# Patient Record
Sex: Female | Born: 1935 | Race: Black or African American | Hispanic: No
Health system: Midwestern US, Community
[De-identification: ages and names within clinical notes are randomized; demographics above are authoritative.]

## PROBLEM LIST (undated history)

## (undated) DIAGNOSIS — I4891 Unspecified atrial fibrillation: Principal | ICD-10-CM

## (undated) DIAGNOSIS — F03A Unspecified dementia, mild, without behavioral disturbance, psychotic disturbance, mood disturbance, and anxiety (HCC): Secondary | ICD-10-CM

## (undated) DIAGNOSIS — Z0000 Encounter for general adult medical examination without abnormal findings: Secondary | ICD-10-CM

## (undated) DIAGNOSIS — I10 Essential (primary) hypertension: Secondary | ICD-10-CM

## (undated) DIAGNOSIS — R0981 Nasal congestion: Secondary | ICD-10-CM

## (undated) DIAGNOSIS — R051 Acute cough: Secondary | ICD-10-CM

## (undated) DIAGNOSIS — E781 Pure hyperglyceridemia: Secondary | ICD-10-CM

## (undated) DIAGNOSIS — G309 Alzheimer's disease, unspecified: Secondary | ICD-10-CM

## (undated) DIAGNOSIS — F02B Dementia in other diseases classified elsewhere, moderate, without behavioral disturbance, psychotic disturbance, mood disturbance, and anxiety (HCC): Secondary | ICD-10-CM

## (undated) DIAGNOSIS — K219 Gastro-esophageal reflux disease without esophagitis: Secondary | ICD-10-CM

## (undated) DIAGNOSIS — I4821 Permanent atrial fibrillation: Secondary | ICD-10-CM

## (undated) DIAGNOSIS — E785 Hyperlipidemia, unspecified: Secondary | ICD-10-CM

## (undated) DIAGNOSIS — G629 Polyneuropathy, unspecified: Secondary | ICD-10-CM

## (undated) DIAGNOSIS — K5790 Diverticulosis of intestine, part unspecified, without perforation or abscess without bleeding: Secondary | ICD-10-CM

## (undated) DIAGNOSIS — E559 Vitamin D deficiency, unspecified: Secondary | ICD-10-CM

## (undated) DIAGNOSIS — I495 Sick sinus syndrome: Secondary | ICD-10-CM

## (undated) DIAGNOSIS — H547 Unspecified visual loss: Secondary | ICD-10-CM

## (undated) HISTORY — DX: Diverticulosis of intestine, part unspecified, without perforation or abscess without bleeding: K57.90

## (undated) HISTORY — DX: Polyneuropathy, unspecified: G62.9

## (undated) HISTORY — DX: Vitamin D deficiency, unspecified: E55.9

---

## 1976-12-18 HISTORY — PX: ABDOMINAL HYSTERECTOMY: SHX81

## 1988-12-18 HISTORY — PX: BREAST REDUCTION SURGERY: SHX8

## 2002-12-18 HISTORY — PX: CHOLECYSTECTOMY: SHX55

## 2007-07-06 ENCOUNTER — Emergency Department (HOSPITAL_COMMUNITY): Admission: EM | Admit: 2007-07-06 | Discharge: 2007-07-06 | Payer: Self-pay | Admitting: Emergency Medicine

## 2007-12-03 ENCOUNTER — Encounter: Admission: RE | Admit: 2007-12-03 | Discharge: 2007-12-03 | Payer: Self-pay | Admitting: Family Medicine

## 2008-12-04 ENCOUNTER — Encounter: Admission: RE | Admit: 2008-12-04 | Discharge: 2008-12-04 | Payer: Self-pay | Admitting: Family Medicine

## 2009-02-05 ENCOUNTER — Encounter: Admission: RE | Admit: 2009-02-05 | Discharge: 2009-02-05 | Payer: Self-pay | Admitting: Obstetrics and Gynecology

## 2009-03-15 ENCOUNTER — Encounter: Admission: RE | Admit: 2009-03-15 | Discharge: 2009-03-15 | Payer: Self-pay | Admitting: Surgery

## 2009-05-11 ENCOUNTER — Inpatient Hospital Stay (HOSPITAL_COMMUNITY): Admission: RE | Admit: 2009-05-11 | Discharge: 2009-05-17 | Payer: Self-pay | Admitting: Surgery

## 2009-05-11 ENCOUNTER — Encounter (INDEPENDENT_AMBULATORY_CARE_PROVIDER_SITE_OTHER): Payer: Self-pay | Admitting: Surgery

## 2009-12-06 ENCOUNTER — Encounter: Admission: RE | Admit: 2009-12-06 | Discharge: 2009-12-06 | Payer: Self-pay | Admitting: Family Medicine

## 2009-12-18 HISTORY — PX: PARTIAL COLECTOMY: SHX5273

## 2010-12-07 ENCOUNTER — Encounter
Admission: RE | Admit: 2010-12-07 | Discharge: 2010-12-07 | Payer: Self-pay | Source: Home / Self Care | Attending: Family Medicine | Admitting: Family Medicine

## 2011-03-28 LAB — DIFFERENTIAL
Basophils Absolute: 0 10*3/uL (ref 0.0–0.1)
Basophils Absolute: 0 10*3/uL (ref 0.0–0.1)
Eosinophils Absolute: 0 10*3/uL (ref 0.0–0.7)
Lymphocytes Relative: 18 % (ref 12–46)
Lymphs Abs: 2.3 10*3/uL (ref 0.7–4.0)
Monocytes Relative: 7 % (ref 3–12)
Monocytes Relative: 7 % (ref 3–12)
Neutro Abs: 12 10*3/uL — ABNORMAL HIGH (ref 1.7–7.7)
Neutro Abs: 7.9 10*3/uL — ABNORMAL HIGH (ref 1.7–7.7)

## 2011-03-28 LAB — CBC
HCT: 43.1 % (ref 36.0–46.0)
Hemoglobin: 12.8 g/dL (ref 12.0–15.0)
Hemoglobin: 14.4 g/dL (ref 12.0–15.0)
MCHC: 33.3 g/dL (ref 30.0–36.0)
MCHC: 34.6 g/dL (ref 30.0–36.0)
MCV: 96.3 fL (ref 78.0–100.0)
MCV: 96.6 fL (ref 78.0–100.0)
Platelets: 263 10*3/uL (ref 150–400)
Platelets: 271 10*3/uL (ref 150–400)
RBC: 3.9 MIL/uL (ref 3.87–5.11)
RBC: 4.46 MIL/uL (ref 3.87–5.11)
RDW: 14 % (ref 11.5–15.5)
RDW: 14.2 % (ref 11.5–15.5)
WBC: 11.3 10*3/uL — ABNORMAL HIGH (ref 4.0–10.5)

## 2011-03-28 LAB — BASIC METABOLIC PANEL
BUN: 8 mg/dL (ref 6–23)
BUN: 9 mg/dL (ref 6–23)
Chloride: 100 mEq/L (ref 96–112)
Chloride: 100 mEq/L (ref 96–112)
Chloride: 106 mEq/L (ref 96–112)
Creatinine, Ser: 0.76 mg/dL (ref 0.4–1.2)
GFR calc Af Amer: >60 mL/min (ref >60)
Sodium: 135 mEq/L (ref 135–145)
Sodium: 137 mEq/L (ref 135–145)
Sodium: 145 mEq/L (ref 135–145)

## 2011-03-28 LAB — HEMOGLOBIN AND HEMATOCRIT, BLOOD: HCT: 43 % (ref 36.0–46.0)

## 2011-05-02 NOTE — Op Note (Signed)
NAMEPAYTYN, MESTA              ACCOUNT NO.:  [id number]   MEDICAL RECORD NO.:  [id number]          PATIENT TYPE:  INP   LOCATION:  0001                         FACILITY:  [facility]   PHYSICIAN:  Thornton Park. 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   PROCEDURE:  Laparotomy, takedown of colovaginal fistula, sigmoid  colectomy, closure of vaginal hole (Dr. Richardson Dopp), primary sigmoid colon  anastomosis, rigid sigmoidoscopy.   SURGEON:  Luretha Murphy, M.D.   ASSISTANT:  Consuello Bossier, M.D.   ANESTHESIA:  General endotracheal.   DRAINS:  One 30 Blake in the pelvis.   DESCRIPTION OF PROCEDURE:  Ms. Marybel Alcott is a 77 year old African  American lady with the above-mentioned problem.  She was taken to Room  11 on Tuesday, May 11, 2009, and given general anesthesia.  The abdomen  was prepped with Betadine including a Betadine vaginal prep.  Dr.  Vonita Moss first put in bilateral ureteral stents, and then with her  prepped and draped, we made a lower midline incision and entered the  abdomen without difficulty.  Exploration revealed a colovaginal fistula,  and once I had taken this down, this hole in the colon was actually in  the mid sigmoid.  I was then able to resect the inflamed portion.  I  looked at her studies, and she has tics scattered throughout the left  colon and in the sigmoid, and I resected an area worse disease.  This  was done by stapling with a contour on either end and resecting the  mesentery using Kelly clamps and 2-0 suture ligatures.   Dr. Richardson Dopp came in and then [facility] the hole in her vagina and dictated  under a separate note.   Next, I went ahead and cleaned the ends of the bowel with  straight  clamps and amputating the staple lines with the proximal bowel clamp.  A  two-layer anastomosis was then constructed using 3-0 silks on the back  wall and an inner layer of running 4-0 PDS on the inner layer and  carried anteriorly in a Connell fashion.  An anterior layer of 3-0 silks  were used to complete the anastomosis.  Rigid sigmoidoscope was then  inserted, and the colon was inflated and submerged, and no bubbles were  seen.   A 19 Blake drain was then placed in the pelvis and brought through a  separate incision.  The wound was then closed with 0 PDS in the  peritoneum layer posteriorly, inferiorly and then some fascia above the  linea semilunaris.  The  anterior fascia was closed with interrupted #1 Novofil, taken care to  avoid the obliquely placed drain in the right lower quadrant.  I felt  like we missed that.  Wound was irrigated and closed with staples.  The  patient was taken to the recovery room in satisfactory condition.  Thornton Park Daphine Deutscher, MD  Electronically Signed     MBM/MEDQ  D:  05/11/2009  T:  05/11/2009  Job:  [id number]   cc:   Dario Guardian, M.D.  Fax: [phone]   Gerald Leitz, MD  Fax: [phone]   Maretta Bees. Vonita Moss, M.D.  Fax: [phone]

## 2011-05-02 NOTE — Op Note (Signed)
Megan [name], WAHLER NO.:  [id number]   MEDICAL RECORD NO.:  [id number]          PATIENT TYPE:  INP   LOCATION:  0001                         FACILITY:  [facility]   PHYSICIAN:  Maretta Bees. Vonita Moss, M.D.DATE OF BIRTH:  02-09-1933   DATE OF PROCEDURE:  05/11/2009  DATE OF DISCHARGE:                               OPERATIVE REPORT   PREOPERATIVE DIAGNOSIS:  Colovaginal fistula.   POSTOPERATIVE DIAGNOSIS:  Colovaginal fistula.   PROCEDURE:  1. Cystoscopy.  2. Bilateral retrograde pyelograms with interpretation.  3. Bilateral ureteral catheter placement.   SURGEON:  Maretta Bees. Vonita Moss, M.D.   ANESTHESIA:  General.   INDICATIONS:  This lady has a colovaginal fistula and is going to have  that repaired today.  Dr. Daphine Deutscher asked me to place ureteral catheters  bilaterally to help him better identify the ureters during his open  surgical exploration.   The patient and her husband had previously been counseled about this  procedure.   PROCEDURE:  The patient was brought to the operating room and placed in  lithotomy position.  External genitalia and abdomen were prepped and  draped in usual fashion.  She was cystoscoped and the bladder was  unremarkable with no stones, tumors or inflammatory lesions.  I then  used a 5-French whistle-tip catheter with a blue marker and this placed  it up the left ureter at a distance of 25-26 cm.  I injected contrast  and she had a nonobstructed pyelocaliceal system with no filling  defects.  I then inserted a similar 5-French whistle-tip catheter with a  red marker up the right ureter.  I injected contrast and there was a  delicate nonobstructed collecting system, but there was a nonobstructing  kink that did not allow easy advancement of the whistle-tip catheter.  I  felt it perfectly adequate to leave it below this area and not risk any  injury to the ureter.  The right ureteral catheter was left about 20 cm  up the  right ureter.   At this point the ureteral catheters were left in place  while I removed the cystoscope.  The ureteral catheters were connected  to a Y tubing connection system along with the Foley catheter that I  placed without difficulty.  Catheters connected to closed drainage and  kept sterile.  She tolerated the procedure well.      Maretta Bees. Vonita Moss, M.D.  Electronically Signed     LJP/MEDQ  D:  05/11/2009  T:  05/11/2009  Job:  [id number]   cc:   Thornton Park Daphine Deutscher, MD  [address], [address]  [place] [address]   Gerald Leitz, MD  Fax: [phone]

## 2011-05-02 NOTE — Op Note (Signed)
NAMEKYMARI, LOLLIS NO.:  [id number]   MEDICAL RECORD NO.:  [id number]          PATIENT TYPE:  INP   LOCATION:  0001                         [facility]ACILITY:  [facility]   PHYSICIAN:  Gerald Leitz, MD          DATE OF BIRTH:  1933-06-10   DATE OF PROCEDURE:  DATE OF DISCHARGE:                               OPERATIVE REPORT   PREOPERATIVE DIAGNOSIS:  Colovaginal fistula.   POSTOPERATIVE DIAGNOSIS:  Colovaginal fistula.   PROCEDURE:  The sigmoid colectomy performed by Dr. Daphine Deutscher.  Repair of  vaginal cuff performed by Gerald Leitz, MD.  This dictation is for the  repair of the vaginal cuff.   INDICATIONS:  This is a 77 year old with a colovaginal fistula.  I was  asked by Dr. Daphine Deutscher to repair the vaginal defect.   PROCEDURE:  Upon entering the operating room, Dr. Daphine Deutscher assisted by  Anselm Pancoast. Weatherly, M.D. had opened the abdomen via a vertical  incision and resected the portion of the sigmoid colon from the vaginal  cuff.  I was asked to repair this defect.  The vaginal cuff defect was  located on the left side of the vaginal cuff and was very close to the  bladder.  The vaginal cuff mucosa was reapproximated with figure-of-  eight stitches of zero Vicryl.  Excellent hemostasis was noted.  At this  point, I left the operating room to allow Dr. Daphine Deutscher and Anselm Pancoast.  Weatherly, M.D. to continue their portion of the surgery.  Please see  their dictation.      Gerald Leitz, MD  Electronically Signed     TC/MEDQ  D:  05/11/2009  T:  05/11/2009  Job:  [id number]

## 2011-05-05 NOTE — H&P (Signed)
NAMEBREYONNA, NAULT NO.:  [id number]   MEDICAL RECORD NO.:  [id number]          PATIENT TYPE:  INP   LOCATION:  1534                         FACILITY:  [facility]   PHYSICIAN:  Thornton Park. Daphine Deutscher, MD  DATE OF BIRTH:  08-27-1933   DATE OF ADMISSION:  05/11/2009  DATE OF DISCHARGE:  05/17/2009                              HISTORY & PHYSICAL   ADMITTING DIAGNOSIS:  Colovaginal fistula.   HISTORY:  Eulanda Dorion is a 77 year old African American lady who is  followed by Dr. Gerald Leitz who has clinically and radiographically a  colovaginal fistula.  She had a previous hysterectomy and has had  diverticulitis and then a diverticular abscess which one night  spontaneously draining in her vagina relieving her pain but producing a  large, foul-smelling vaginal discharge.  Since that time, she has  noticed fecal material passing in her vagina.  She was seen in the  office and we proceeded to schedule her for surgery.  Informed consent  was obtained.   PAST MEDICAL HISTORY:  1. She has atrial fibrillation, is on Coumadin.  2. She has high blood pressure.   PATIENT HAS NO KNOWN ALLERGIES.   MEDICATIONS:  1. Lopid 600 mg b.i.d.  2. Verapamil 240 mg 1 tablet a day.  3. Metoprolol 50 mg one twice a day.  4. Lanoxin 0.25 mg one a day.  5. Hydrochlorothiazide 12.5 mg one day.  6. Coumadin 2.5 mg 1 tablet a day.   FAMILY HISTORY:  Both parents are deceased.   REVIEW OF SYSTEMS:  Unremarkable.   VITAL SIGNS:  Pulse 80.  Afebrile.  Blood pressure 112/75.  HEENT:  Unremarkable.  NECK:  Supple.  CHEST:  Clear to auscultation.  HEART:  Sinus rhythm without murmurs.  ABDOMEN:  Nontender.  No tenderness in the lower abdomen.  SKIN:  Turgor normal.  NEUROPSYCH:  Mood and affect were appropriate.   IMPRESSION:  Colovaginal fistula.   PLAN:  Preoperative stent placement followed by sigmoid colectomy and  closure of the vaginal hole.      Thornton Park Daphine Deutscher, MD  Electronically Signed     MBM/MEDQ  D:  06/06/2009  T:  06/07/2009  Job:  [id number]

## 2011-05-05 NOTE — Discharge Summary (Signed)
Megan [name], UYENO NO.:  [id number]   MEDICAL RECORD NO.:  [id number]          PATIENT TYPE:  INP   LOCATION:  1534                         FACILITY:  [facility]   PHYSICIAN:  Thornton Park. Daphine Deutscher, MD  DATE OF BIRTH:  December 03, 1933   DATE OF ADMISSION:  05/11/2009  DATE OF DISCHARGE:  05/17/2009                               DISCHARGE SUMMARY   ADMISSION DIAGNOSIS:  Colovaginal fistula.   PROCEDURE:  1. Takedown of colovaginal fistula.  2. Sigmoid colectomy with primary anastomosis.  3. Closure of vagina by Dr. Gerald Leitz with rigid sigmoidoscopy.   HOSPITAL COURSE:  This 77 year old lady underwent cystoscopy and  bilateral ureteral stent placement by Dr. Vonita Moss.  She was in the  operating room and, therefore, underwent the above-mentioned takedown of  this fistula and closure of the hole in the vagina by Dr. Richardson Dopp.  She had  an end-to-end anastomosis and this was tested with sigmoidoscopy  intraoperatively.  She was admitted and observed postoperatively and was  passing flatus by postop day #3 and JP was serosanguineous.  She  continued to improve.  Her diet was advanced.  She was ready for  discharge on May 31 which would be postop day #6.  Condition improved.   FINAL DIAGNOSIS:  Status post takedown of colovaginal fistula.      Thornton Park Daphine Deutscher, MD  Electronically Signed     MBM/MEDQ  D:  06/06/2009  T:  06/07/2009  Job:  [id number]

## 2011-11-14 ENCOUNTER — Other Ambulatory Visit: Payer: Self-pay | Admitting: Family Medicine

## 2011-11-14 DIAGNOSIS — Z1231 Encounter for screening mammogram for malignant neoplasm of breast: Secondary | ICD-10-CM

## 2011-12-14 ENCOUNTER — Ambulatory Visit
Admission: RE | Admit: 2011-12-14 | Discharge: 2011-12-14 | Disposition: A | Discharge status: Home / Self Care | Payer: Medicare Other | Source: Ambulatory Visit | Attending: Family Medicine | Admitting: Family Medicine

## 2011-12-14 DIAGNOSIS — Z1231 Encounter for screening mammogram for malignant neoplasm of breast: Secondary | ICD-10-CM

## 2011-12-27 DIAGNOSIS — I4891 Unspecified atrial fibrillation: Secondary | ICD-10-CM | POA: Diagnosis not present

## 2012-01-24 DIAGNOSIS — Z79899 Other long term (current) drug therapy: Secondary | ICD-10-CM | POA: Diagnosis not present

## 2012-01-24 DIAGNOSIS — N949 Unspecified condition associated with female genital organs and menstrual cycle: Secondary | ICD-10-CM | POA: Diagnosis not present

## 2012-01-24 DIAGNOSIS — E785 Hyperlipidemia, unspecified: Secondary | ICD-10-CM | POA: Diagnosis not present

## 2012-01-24 DIAGNOSIS — I4891 Unspecified atrial fibrillation: Secondary | ICD-10-CM | POA: Diagnosis not present

## 2012-01-24 DIAGNOSIS — Z01419 Encounter for gynecological examination (general) (routine) without abnormal findings: Secondary | ICD-10-CM | POA: Diagnosis not present

## 2012-02-01 DIAGNOSIS — N949 Unspecified condition associated with female genital organs and menstrual cycle: Secondary | ICD-10-CM | POA: Diagnosis not present

## 2012-02-28 DIAGNOSIS — I4891 Unspecified atrial fibrillation: Secondary | ICD-10-CM | POA: Diagnosis not present

## 2012-02-28 DIAGNOSIS — E785 Hyperlipidemia, unspecified: Secondary | ICD-10-CM | POA: Diagnosis not present

## 2012-03-26 DIAGNOSIS — E538 Deficiency of other specified B group vitamins: Secondary | ICD-10-CM | POA: Diagnosis not present

## 2012-03-26 DIAGNOSIS — I4891 Unspecified atrial fibrillation: Secondary | ICD-10-CM | POA: Diagnosis not present

## 2012-03-26 DIAGNOSIS — M6281 Muscle weakness (generalized): Secondary | ICD-10-CM | POA: Diagnosis not present

## 2012-03-26 DIAGNOSIS — I10 Essential (primary) hypertension: Secondary | ICD-10-CM | POA: Diagnosis not present

## 2012-03-26 DIAGNOSIS — E785 Hyperlipidemia, unspecified: Secondary | ICD-10-CM | POA: Diagnosis not present

## 2012-03-26 DIAGNOSIS — G609 Hereditary and idiopathic neuropathy, unspecified: Secondary | ICD-10-CM | POA: Diagnosis not present

## 2012-03-26 DIAGNOSIS — Z1331 Encounter for screening for depression: Secondary | ICD-10-CM | POA: Diagnosis not present

## 2012-03-26 DIAGNOSIS — E559 Vitamin D deficiency, unspecified: Secondary | ICD-10-CM | POA: Diagnosis not present

## 2012-04-01 DIAGNOSIS — Z7901 Long term (current) use of anticoagulants: Secondary | ICD-10-CM | POA: Diagnosis not present

## 2012-04-01 DIAGNOSIS — I10 Essential (primary) hypertension: Secondary | ICD-10-CM | POA: Diagnosis not present

## 2012-04-01 DIAGNOSIS — M545 Low back pain: Secondary | ICD-10-CM | POA: Diagnosis not present

## 2012-04-01 DIAGNOSIS — M6281 Muscle weakness (generalized): Secondary | ICD-10-CM | POA: Diagnosis not present

## 2012-04-01 DIAGNOSIS — H548 Legal blindness, as defined in USA: Secondary | ICD-10-CM | POA: Diagnosis not present

## 2012-04-01 DIAGNOSIS — E559 Vitamin D deficiency, unspecified: Secondary | ICD-10-CM | POA: Diagnosis not present

## 2012-04-03 DIAGNOSIS — Z79899 Other long term (current) drug therapy: Secondary | ICD-10-CM | POA: Diagnosis not present

## 2012-04-03 DIAGNOSIS — I119 Hypertensive heart disease without heart failure: Secondary | ICD-10-CM | POA: Diagnosis not present

## 2012-04-03 DIAGNOSIS — I4891 Unspecified atrial fibrillation: Secondary | ICD-10-CM | POA: Diagnosis not present

## 2012-04-09 DIAGNOSIS — M6281 Muscle weakness (generalized): Secondary | ICD-10-CM | POA: Diagnosis not present

## 2012-04-09 DIAGNOSIS — Z79899 Other long term (current) drug therapy: Secondary | ICD-10-CM | POA: Diagnosis not present

## 2012-04-09 DIAGNOSIS — E559 Vitamin D deficiency, unspecified: Secondary | ICD-10-CM | POA: Diagnosis not present

## 2012-04-09 DIAGNOSIS — E785 Hyperlipidemia, unspecified: Secondary | ICD-10-CM | POA: Diagnosis not present

## 2012-04-09 DIAGNOSIS — M545 Low back pain: Secondary | ICD-10-CM | POA: Diagnosis not present

## 2012-04-09 DIAGNOSIS — G609 Hereditary and idiopathic neuropathy, unspecified: Secondary | ICD-10-CM | POA: Diagnosis not present

## 2012-04-09 DIAGNOSIS — I10 Essential (primary) hypertension: Secondary | ICD-10-CM | POA: Diagnosis not present

## 2012-04-09 DIAGNOSIS — E538 Deficiency of other specified B group vitamins: Secondary | ICD-10-CM | POA: Diagnosis not present

## 2012-04-09 DIAGNOSIS — H548 Legal blindness, as defined in USA: Secondary | ICD-10-CM | POA: Diagnosis not present

## 2012-04-09 DIAGNOSIS — I4891 Unspecified atrial fibrillation: Secondary | ICD-10-CM | POA: Diagnosis not present

## 2012-04-09 DIAGNOSIS — Z7901 Long term (current) use of anticoagulants: Secondary | ICD-10-CM | POA: Diagnosis not present

## 2012-04-10 DIAGNOSIS — H548 Legal blindness, as defined in USA: Secondary | ICD-10-CM | POA: Diagnosis not present

## 2012-04-10 DIAGNOSIS — E559 Vitamin D deficiency, unspecified: Secondary | ICD-10-CM | POA: Diagnosis not present

## 2012-04-10 DIAGNOSIS — M6281 Muscle weakness (generalized): Secondary | ICD-10-CM | POA: Diagnosis not present

## 2012-04-10 DIAGNOSIS — M545 Low back pain: Secondary | ICD-10-CM | POA: Diagnosis not present

## 2012-04-10 DIAGNOSIS — Z7901 Long term (current) use of anticoagulants: Secondary | ICD-10-CM | POA: Diagnosis not present

## 2012-04-10 DIAGNOSIS — I10 Essential (primary) hypertension: Secondary | ICD-10-CM | POA: Diagnosis not present

## 2012-04-17 DIAGNOSIS — E559 Vitamin D deficiency, unspecified: Secondary | ICD-10-CM | POA: Diagnosis not present

## 2012-04-17 DIAGNOSIS — I10 Essential (primary) hypertension: Secondary | ICD-10-CM | POA: Diagnosis not present

## 2012-04-17 DIAGNOSIS — H548 Legal blindness, as defined in USA: Secondary | ICD-10-CM | POA: Diagnosis not present

## 2012-04-17 DIAGNOSIS — M545 Low back pain: Secondary | ICD-10-CM | POA: Diagnosis not present

## 2012-04-17 DIAGNOSIS — Z7901 Long term (current) use of anticoagulants: Secondary | ICD-10-CM | POA: Diagnosis not present

## 2012-04-17 DIAGNOSIS — M6281 Muscle weakness (generalized): Secondary | ICD-10-CM | POA: Diagnosis not present

## 2012-05-23 DIAGNOSIS — E559 Vitamin D deficiency, unspecified: Secondary | ICD-10-CM | POA: Diagnosis not present

## 2012-05-23 DIAGNOSIS — I4891 Unspecified atrial fibrillation: Secondary | ICD-10-CM | POA: Diagnosis not present

## 2012-06-27 DIAGNOSIS — E7800 Pure hypercholesterolemia, unspecified: Secondary | ICD-10-CM | POA: Diagnosis not present

## 2012-06-27 DIAGNOSIS — M6281 Muscle weakness (generalized): Secondary | ICD-10-CM | POA: Diagnosis not present

## 2012-06-27 DIAGNOSIS — E785 Hyperlipidemia, unspecified: Secondary | ICD-10-CM | POA: Diagnosis not present

## 2012-06-27 DIAGNOSIS — I4891 Unspecified atrial fibrillation: Secondary | ICD-10-CM | POA: Diagnosis not present

## 2012-06-27 DIAGNOSIS — E559 Vitamin D deficiency, unspecified: Secondary | ICD-10-CM | POA: Diagnosis not present

## 2012-06-27 DIAGNOSIS — I10 Essential (primary) hypertension: Secondary | ICD-10-CM | POA: Diagnosis not present

## 2012-06-27 DIAGNOSIS — G609 Hereditary and idiopathic neuropathy, unspecified: Secondary | ICD-10-CM | POA: Diagnosis not present

## 2012-06-27 DIAGNOSIS — E538 Deficiency of other specified B group vitamins: Secondary | ICD-10-CM | POA: Diagnosis not present

## 2012-08-01 DIAGNOSIS — I4891 Unspecified atrial fibrillation: Secondary | ICD-10-CM | POA: Diagnosis not present

## 2012-09-05 DIAGNOSIS — I4891 Unspecified atrial fibrillation: Secondary | ICD-10-CM | POA: Diagnosis not present

## 2012-09-22 ENCOUNTER — Emergency Department (HOSPITAL_COMMUNITY): Payer: Medicare Other

## 2012-09-22 ENCOUNTER — Inpatient Hospital Stay (HOSPITAL_COMMUNITY)
Admission: EM | Admit: 2012-09-22 | Discharge: 2012-09-26 | DRG: 244 | Disposition: A | Discharge status: Home / Self Care | Payer: Medicare Other | Attending: Cardiology | Admitting: Cardiology

## 2012-09-22 ENCOUNTER — Encounter (HOSPITAL_COMMUNITY): Payer: Self-pay | Admitting: *Deleted

## 2012-09-22 ENCOUNTER — Encounter (HOSPITAL_COMMUNITY): Payer: Self-pay

## 2012-09-22 ENCOUNTER — Emergency Department (INDEPENDENT_AMBULATORY_CARE_PROVIDER_SITE_OTHER)
Admission: EM | Admit: 2012-09-22 | Discharge: 2012-09-22 | Disposition: A | Discharge status: Nursing Home (Includes ICF) | Payer: Medicare Other | Source: Home / Self Care

## 2012-09-22 DIAGNOSIS — I498 Other specified cardiac arrhythmias: Secondary | ICD-10-CM

## 2012-09-22 DIAGNOSIS — I495 Sick sinus syndrome: Secondary | ICD-10-CM | POA: Diagnosis not present

## 2012-09-22 DIAGNOSIS — R55 Syncope and collapse: Secondary | ICD-10-CM | POA: Diagnosis not present

## 2012-09-22 DIAGNOSIS — K219 Gastro-esophageal reflux disease without esophagitis: Secondary | ICD-10-CM | POA: Diagnosis present

## 2012-09-22 DIAGNOSIS — Z7901 Long term (current) use of anticoagulants: Secondary | ICD-10-CM | POA: Diagnosis not present

## 2012-09-22 DIAGNOSIS — R001 Bradycardia, unspecified: Secondary | ICD-10-CM

## 2012-09-22 DIAGNOSIS — E785 Hyperlipidemia, unspecified: Secondary | ICD-10-CM | POA: Diagnosis present

## 2012-09-22 DIAGNOSIS — I422 Other hypertrophic cardiomyopathy: Secondary | ICD-10-CM | POA: Diagnosis present

## 2012-09-22 DIAGNOSIS — I119 Hypertensive heart disease without heart failure: Secondary | ICD-10-CM | POA: Diagnosis present

## 2012-09-22 DIAGNOSIS — Z950 Presence of cardiac pacemaker: Secondary | ICD-10-CM

## 2012-09-22 DIAGNOSIS — I4891 Unspecified atrial fibrillation: Secondary | ICD-10-CM | POA: Diagnosis not present

## 2012-09-22 DIAGNOSIS — I4821 Permanent atrial fibrillation: Secondary | ICD-10-CM | POA: Diagnosis present

## 2012-09-22 DIAGNOSIS — T45515A Adverse effect of anticoagulants, initial encounter: Secondary | ICD-10-CM | POA: Diagnosis present

## 2012-09-22 HISTORY — DX: Gastro-esophageal reflux disease without esophagitis: K21.9

## 2012-09-22 HISTORY — DX: Unspecified visual loss: H54.7

## 2012-09-22 HISTORY — DX: Essential (primary) hypertension: I10

## 2012-09-22 HISTORY — DX: Sick sinus syndrome: I49.5

## 2012-09-22 HISTORY — DX: Permanent atrial fibrillation: I48.21

## 2012-09-22 HISTORY — DX: Hyperlipidemia, unspecified: E78.5

## 2012-09-22 LAB — CBC
HCT: 41.9 % (ref 36.0–46.0)
Hemoglobin: 14.3 g/dL (ref 12.0–15.0)
RBC: 4.52 MIL/uL (ref 3.87–5.11)
RDW: 13.2 % (ref 11.5–15.5)
WBC: 10.1 10*3/uL (ref 4.0–10.5)

## 2012-09-22 LAB — POCT I-STAT TROPONIN I: Troponin i, poc: 0.01 ng/mL (ref 0.00–0.08)

## 2012-09-22 LAB — POCT I-STAT, CHEM 8
BUN: 17 mg/dL (ref 6–23)
Hemoglobin: 14.3 g/dL (ref 12.0–15.0)
Sodium: 141 mEq/L (ref 135–145)
TCO2: 26 mmol/L (ref 0–100)

## 2012-09-22 LAB — DIGOXIN LEVEL: Digoxin Level: 1.5 ng/mL (ref 0.8–2.0)

## 2012-09-22 LAB — PROTIME-INR: INR: 3.23 — ABNORMAL HIGH (ref 0.00–1.49)

## 2012-09-22 MED ORDER — ACETAMINOPHEN 325 MG PO TABS: 650.0000 mg | ORAL_TABLET | Freq: Four times a day (QID) | ORAL | Status: DC | PRN

## 2012-09-22 MED ORDER — ATROPINE SULFATE 1 MG/ML IJ SOLN
INTRAMUSCULAR | Status: AC
  Filled 2012-09-22: qty 1

## 2012-09-22 MED ORDER — HYDROCHLOROTHIAZIDE 12.5 MG PO CAPS
12.5000 mg | ORAL_CAPSULE | Freq: Every day | ORAL | Status: DC
  Administered 2012-09-23 – 2012-09-26 (×3): 12.5 mg via ORAL
  Filled 2012-09-22 (×4): qty 1

## 2012-09-22 MED ORDER — SODIUM CHLORIDE 0.9 % IJ SOLN
3.0000 mL | Freq: Two times a day (BID) | INTRAMUSCULAR | Status: DC
  Administered 2012-09-23 – 2012-09-26 (×4): 3 mL via INTRAVENOUS

## 2012-09-22 MED ORDER — FENOFIBRATE 160 MG PO TABS
160.0000 mg | ORAL_TABLET | Freq: Every day | ORAL | Status: DC
  Administered 2012-09-24 – 2012-09-26 (×2): 160 mg via ORAL
  Filled 2012-09-22 (×4): qty 1

## 2012-09-22 MED ORDER — SODIUM CHLORIDE 0.9 % IV SOLN: INTRAVENOUS | Status: DC

## 2012-09-22 MED ORDER — ACETAMINOPHEN 650 MG RE SUPP: 650.0000 mg | Freq: Four times a day (QID) | RECTAL | Status: DC | PRN

## 2012-09-22 NOTE — ED Provider Notes (Signed)
History     CSN: [id number]  Arrival date & time 09/22/12  1605   None     Chief Complaint  Patient presents with  . Irregular Heart Beat    (Consider location/radiation/quality/duration/timing/severity/associated sxs/prior treatment) The history is provided by the patient.  patient reports near syncopal episode today while singing in church.  Sudden onset of dizziness, vitals signs checked by church nurse and found to have a low pulse rate.  Reports she rechecked vitals signs upon return to home and pulse remains low.  States no return of symptoms, known history of atrial fibrillation followed by Dr. Armanda Magic, currently on coumadin and betablocker.  Denies recent illness-one episode of diarrhea last night attributed to ?food intake.  No associated chest pain or shortness of breath.  No history of same, denies known cardiac disease or surgery.  Past Medical History  Diagnosis Date  . Atrial fibrillation   . Benign hypertensive heart disease without heart failure     Past Surgical History  Procedure Date  . Gallbladder surgery     No family history on file.  History  Substance Use Topics  . Smoking status: Never Smoker   . Smokeless tobacco: Not on file  . Alcohol Use: No    OB History    Grav Para Term Preterm Abortions TAB SAB Ect Mult Living                  Review of Systems  Genitourinary: Negative.   All other systems reviewed and are negative.    Allergies  Review of patient's allergies indicates no known allergies.  Home Medications   Current Outpatient Rx  Name Route Sig Dispense Refill  . ACETAMINOPHEN 500 MG PO TABS Oral Take 500 mg by mouth daily.    Marland Kitchen DIGOXIN 0.25 MG PO TABS Oral Take 0.25 mg by mouth daily.    Marland Kitchen FAMOTIDINE 10 MG PO TABS Oral Take 10 mg by mouth daily.    . FENOFIBRATE 160 MG PO TABS Oral Take 160 mg by mouth daily.    Marland Kitchen HYDROCHLOROTHIAZIDE 12.5 MG PO CAPS Oral Take 12.5 mg by mouth daily.    Marland Kitchen METOPROLOL TARTRATE PO Oral  Take 50 mg by mouth 2 (two) times daily.    . ICAPS AREDS FORMULA PO Oral Take by mouth 2 times daily at 12 noon and 4 pm.    . VERAPAMIL HCL ER 240 MG PO TBCR Oral Take 240 mg by mouth at bedtime.    Marland Kitchen VITAMIN D (ERGOCALCIFEROL) 50000 UNITS PO CAPS Oral Take 50,000 Units by mouth once a week.    . WARFARIN SODIUM 2.5 MG PO TABS Oral Take 2.5 mg by mouth. Take 2.5 mg Sun, Tues, Thurs and Sat, take 3.75 MG Monday, Wed and Friday      BP 155/73  Pulse 48  Temp 98.2 F (36.8 C) (Oral)  Resp 16  SpO2 97%  Physical Exam  Nursing note and vitals reviewed. Constitutional: She is oriented to person, place, and time. Vital signs are normal. She appears well-developed and well-nourished. She is active and cooperative.  HENT:  Head: Normocephalic.  Eyes: Conjunctivae normal are normal. Pupils are equal, round, and reactive to light. No scleral icterus.  Neck: Trachea normal. Neck supple.  Cardiovascular: Regular rhythm, normal heart sounds and intact distal pulses.  Bradycardia present.  Exam reveals decreased pulses.        No carotid bruits, Irregular bradycardiac apical pulse, distal pulses diminished  Pulmonary/Chest: Effort  normal and breath sounds normal.  Abdominal: Soft. Normal appearance. There is no tenderness.  Neurological: She is alert and oriented to person, place, and time. She has normal strength and normal reflexes. No cranial nerve deficit or sensory deficit. GCS eye subscore is 4. GCS verbal subscore is 5. GCS motor subscore is 6.  Skin: Skin is warm, dry and intact. She is not diaphoretic.  Psychiatric: She has a normal mood and affect. Her speech is normal and behavior is normal. Judgment and thought content normal. Cognition and memory are normal.    ED Course  Procedures (including critical care time)  Labs Reviewed - No data to display No results found.   1. Bradycardia   2. Near syncope       MDM  EKG- abnormal, Bradycardia rate 48. [facility] transfer to [facility]  for further evaluation and treatment of bradycardia.  Insert peripheral iv, O2 via [place] and cardiac monitoring       Johnsie Kindred, NP 09/22/12 1659

## 2012-09-22 NOTE — ED Notes (Signed)
C/o low pulse rate , started today, patient does have afib

## 2012-09-22 NOTE — H&P (Addendum)
Megan [name] is an 78 y.o. female.   Chief Complaint: slow heart rate HPI: 78 yo woman with PMH of atrial fibrillation, GERD, hypertension and dyslipidemia who is on rate control and anticoagulation with warfarin for her atrial fibrillation who was very lightheaded at church while singing today. She sat down (did not lose consciousness) and was surrounded by 4 nurses from her church. They checked her heart-rate and found it to be 50. They told her if it continued to be low or she had symptoms then she should go to the ER/call her physician. Her husband checked it several times today and and it was as low as 43 so they called urgent care and came in and were directed to the ER. In the ER she was noted to have HR ranging from 38-60s but stable blood pressure. She's had no recent chest pain, no significant change in SOB or DOE. She has stable weight and appetite. No prior syncope or significant orthostatic hypotension.   Past Medical History  Diagnosis Date  . Atrial fibrillation   . Benign hypertensive heart disease without heart failure   . Blind   . Atrial fibrillation   . Symptomatic bradycardia   . GERD (gastroesophageal reflux disease)   . Dyslipidemia   . Current use of long term anticoagulation   . Hypertension     Past Surgical History  Procedure Date  . Gallbladder surgery     History reviewed. No pertinent family history. Social History:  reports that she has never smoked. She does not have any smokeless tobacco history on file. She reports that she does not drink alcohol or use illicit drugs. Family history: no known history of significant diabetes or bradycardia  Allergies: No Known Allergies  Review of Systems  Constitutional: Negative for fever, chills, weight loss and malaise/fatigue.  HENT: Negative for hearing loss, neck pain and tinnitus.   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   Cardiovascular: Negative for chest pain, palpitations and orthopnea.  Gastrointestinal: Positive for heartburn and diarrhea. Negative for nausea, vomiting and abdominal pain.  Genitourinary: Negative for dysuria, urgency and frequency.  Musculoskeletal: Negative for myalgias.  Skin: Negative for itching and rash.  Neurological: Positive for focal weakness. Negative for sensory change, seizures, loss of consciousness and headaches.       + lightheadedness  Endo/Heme/Allergies: Negative for environmental allergies. Does not bruise/bleed easily.  Psychiatric/Behavioral: Negative for depression, suicidal ideas and substance abuse.    Blood pressure 125/87, pulse 38, resp. rate 14, SpO2 100.00%. Physical Exam  Nursing note and vitals reviewed. Constitutional: She is oriented to person, place, and time. She appears well-developed and well-nourished.  HENT:  Nose: Nose normal.  Mouth/Throat: Oropharynx is clear and moist. No oropharyngeal exudate.  Eyes: Conjunctivae normal are normal. Right eye exhibits no discharge. Left eye exhibits no discharge. No scleral icterus.  Neck: Normal range of motion. Neck supple. No JVD present. No tracheal deviation present. No thyromegaly present.  Cardiovascular: Exam reveals no gallop.   No murmur heard.      Irregularly irregular; bradycardic  Respiratory: Effort normal. No respiratory distress. She has no wheezes. She has rales.       Dry scattered crackles at the bases that clear superiorly  GI: Soft. Bowel sounds are normal. She exhibits no distension. There is no tenderness. There  is no rebound.  Musculoskeletal: Normal range of motion. She exhibits no edema and no tenderness.  Neurological: She is alert and oriented to person, place, and time. No cranial nerve deficit. Coordination normal.  Skin: Skin is warm and dry. No rash noted. No erythema.  Psychiatric: She has a normal mood and affect. Her  behavior is normal.    Labs reviewed; K 4.1, Cr. 0.9, bun 17, na 141, inr 3.2, digoxin 1.5, h/h 14.3/41.9, plt 275 EKG reviewed; slow atrial fibrillation HR ~ 50 Chest X-ray: generous heart border/borderline cardiomegaly  Problem List Symptomatic Bradycardia  Slow Atrial fibrillation on digoxin, verapamil and metoprolol Long term anticoagulation with Warfarin, INR 3.2 Hypertension Dyslipidemia on fenofibrate  Assessment/Plan 78 yo woman with PMH of dyslipidemia, atrial fibrillation on digoxin, verapamil and metoprolol with anticoagulation therapeutic on warfarin who had lightheadedness today and found to have slow atrial fibrillation recorded as low as 38 now with symptomatic bradycardia. She has stable renal function and she continues to maintain appropriate mildly hypertensive blood pressure response to bradycardia. She has symptomatic bradycardia with indication for beta blocker or calcium channel blocker to treat her atrial fibrillation so she has a class I indication for a pacemaker. However, she is on digoxin, verapamil and metoprolol so weaning medications may relieve her significant bradycardia. I discussed the challenges with Mr. And Ms. Cradle and they understand we are washing out medications and watching telemetry and symptoms to determine if she'll need a pacemaker placed in the next few days. - holding digoxin, verapamil and metoprolol now - she took them most recently this morning - NPO after midnight for possible permanent pacemaker based on telemetry, drug washout and symptom monitoring - holding warfarin tonight given possible procedure and therapeutic at 3.2, check INR in AM - continue hctz for HTN - check TSH, trend troponins, check hba1c, lipid panel - continue fenofibrate - Stepdown status with continuous telemetry and low threshold for transcutaneous pacing bridge to temporary transvenous pacemaker if necessary - 2D Echo in AM to assess LV function  Latavius Capizzi,  Royann Wildasin 09/22/2012, 9:15 PM

## 2012-09-22 NOTE — ED Notes (Signed)
Per [facility] pt transferred from [facility] for syncopal episode at church and bradycardia in the 40s and 50s. BP 136/77. HR 56. 100% on 2l. Pt was sat down when she had episode and did not injure herself. IV 18g hand

## 2012-09-22 NOTE — ED Notes (Signed)
[facility] called for transport 

## 2012-09-22 NOTE — ED Provider Notes (Signed)
History     CSN: [id number]  Arrival date & time 09/22/12  1715   First MD Initiated Contact with Patient 09/22/12 1756      Chief Complaint  Patient presents with  . Bradycardia  . Loss of Consciousness    (Consider location/radiation/quality/duration/timing/severity/associated sxs/prior treatment) HPI Comments: Patient has history of htn, afib followed by Dr. Carolanne Grumbling with [facility].  She has been feeling weak for the past week.  She went to church this morning and became weak, dizzy, and near-syncopal.  There were several nurses at the church who looked her over and decided her pulse was too low and recommended she go to Urgent Care.  She was then transferred here.    Patient is a 78 y.o. female presenting with syncope. The history is provided by the patient.  Loss of Consciousness This is a new problem. The current episode started 1 to 2 hours ago. Episode frequency: once  The problem has been resolved. Pertinent negatives include no chest pain and no shortness of breath. Associated symptoms comments: weakness. Nothing aggravates the symptoms. Nothing relieves the symptoms. She has tried nothing for the symptoms.    Past Medical History  Diagnosis Date  . Atrial fibrillation   . Benign hypertensive heart disease without heart failure   . Blind     Past Surgical History  Procedure Date  . Gallbladder surgery     History reviewed. No pertinent family history.  History  Substance Use Topics  . Smoking status: Never Smoker   . Smokeless tobacco: Not on file  . Alcohol Use: No    OB History    Grav Para Term Preterm Abortions TAB SAB Ect Mult Living                  Review of Systems  Respiratory: Negative for shortness of breath.   Cardiovascular: Positive for syncope. Negative for chest pain.  All other systems reviewed and are negative.    Allergies  Review of patient's allergies indicates no known allergies.  Home Medications   Current  Outpatient Rx  Name Route Sig Dispense Refill  . DIGOXIN 0.25 MG PO TABS Oral Take 0.25 mg by mouth daily.    Marland Kitchen FAMOTIDINE 10 MG PO TABS Oral Take 10 mg by mouth daily as needed. For acid reflux    . FENOFIBRATE 160 MG PO TABS Oral Take 160 mg by mouth daily.    Marland Kitchen HYDROCHLOROTHIAZIDE 12.5 MG PO CAPS Oral Take 12.5 mg by mouth daily.    Marland Kitchen METOPROLOL TARTRATE 50 MG PO TABS Oral Take 50 mg by mouth 2 (two) times daily.    . ICAPS AREDS FORMULA PO Oral Take 2 capsules by mouth daily.     Marland Kitchen VERAPAMIL HCL ER 240 MG PO TBCR Oral Take 240 mg by mouth daily.     Marland Kitchen VITAMIN D (ERGOCALCIFEROL) 50000 UNITS PO CAPS Oral Take 50,000 Units by mouth once a week. Does not take on any specific day per husband    . WARFARIN SODIUM 2.5 MG PO TABS Oral Take 2.5-3.75 mg by mouth every evening. Take 2.5 mg Sun, Tues, Thurs and Sat, take 3.75 MG Monday, Wed and Friday      There were no vitals taken for this visit.  Physical Exam  Nursing note and vitals reviewed. Constitutional: She is oriented to person, place, and time. She appears well-developed and well-nourished. No distress.  HENT:  Head: Normocephalic and atraumatic.  Neck: Normal range of motion.  Neck supple.  Cardiovascular:  No murmur heard.      The heart rate is irregularly irregular and bradycardic, at times with pauses.    Pulmonary/Chest: Effort normal and breath sounds normal. No respiratory distress. She has no wheezes.  Abdominal: Soft. Bowel sounds are normal. She exhibits no distension. There is no tenderness.  Musculoskeletal: Normal range of motion.  Neurological: She is alert and oriented to person, place, and time.  Skin: Skin is warm and dry. She is not diaphoretic.    ED Course  Procedures (including critical care time)   Labs Reviewed  DIGOXIN LEVEL  CBC  PROTIME-INR   No results found.   No diagnosis found.   Date: 09/22/2012  Rate: 47  Rhythm: atrial fibrillation  QRS Axis: normal  Intervals: normal  ST/T Wave  abnormalities: normal  Conduction Disutrbances:none  Narrative Interpretation:   Old EKG Reviewed: none available    MDM  The patient presents here with a near-syncopal episode at church.  She was found to have a low heart rate as was sent here for evaluation of this.  She is feeling better, however she is in atrial fibrillation with episodes where there are pauses up to 2.5 seconds.  The labs and workup are otherwise unremarkable.  She is on a beta-blocker.  I have consulted cardiology who agrees to admit the patient for further workup.          Geoffery Lyons, MD 09/23/12 [phone]

## 2012-09-22 NOTE — ED Provider Notes (Signed)
Medical screening examination/treatment/procedure(s) were performed by non-physician practitioner and as supervising physician I was immediately available for consultation/collaboration.  Raynald Blend, MD 09/22/12 1739

## 2012-09-23 LAB — URINALYSIS, ROUTINE W REFLEX MICROSCOPIC
Bilirubin Urine: NEGATIVE
Leukocytes, UA: NEGATIVE
Nitrite: NEGATIVE
Specific Gravity, Urine: 1.011 (ref 1.005–1.030)
Urobilinogen, UA: 0.2 mg/dL (ref 0.0–1.0)
pH: 7.5 (ref 5.0–8.0)

## 2012-09-23 LAB — COMPREHENSIVE METABOLIC PANEL
AST: 22 U/L (ref 0–37)
Alkaline Phosphatase: 48 U/L (ref 39–117)
BUN: 10 mg/dL (ref 6–23)
CO2: 27 mEq/L (ref 19–32)
Chloride: 102 mEq/L (ref 96–112)
Creatinine, Ser: 0.6 mg/dL (ref 0.50–1.10)
GFR calc non Af Amer: 86 mL/min — ABNORMAL LOW (ref >90)
Total Bilirubin: 0.5 mg/dL (ref 0.3–1.2)

## 2012-09-23 LAB — HEMOGLOBIN A1C: Mean Plasma Glucose: 117 mg/dL — ABNORMAL HIGH (ref <117)

## 2012-09-23 LAB — CBC
MCH: 31.7 pg (ref 26.0–34.0)
Platelets: 246 10*3/uL (ref 150–400)
RBC: 4.48 MIL/uL (ref 3.87–5.11)
RDW: 13.2 % (ref 11.5–15.5)

## 2012-09-23 LAB — PROTIME-INR: Prothrombin Time: 29 seconds — ABNORMAL HIGH (ref 11.6–15.2)

## 2012-09-23 LAB — MRSA PCR SCREENING: MRSA by PCR: NEGATIVE

## 2012-09-23 LAB — TSH: TSH: 1.691 u[IU]/mL (ref 0.350–4.500)

## 2012-09-23 NOTE — Progress Notes (Signed)
SUBJECTIVE:  Heart rate in the 50-60's at present OBJECTIVE:   Vitals:   Filed Vitals:   09/23/12 0200 09/23/12 0415 09/23/12 0420 09/23/12 0800  BP: 139/59 142/63  128/68  Pulse: 63 51 72 65  Temp:  97.4 F (36.3 C)  97.8 F (36.6 C)  TempSrc:  Oral  Oral  Resp: 16 15 15 16   Height:      Weight:      SpO2: 100% 100% 100% 98%   I&O's:   Intake/Output Summary (Last 24 hours) at 09/23/12 0835 Last data filed at 09/23/12 0700  Gross per 24 hour  Intake     90 ml  Output    600 ml  Net   -510 ml   TELEMETRY: Reviewed telemetry pt in atrial fibrillation     PHYSICAL EXAM General: Well developed, well nourished, in no acute distress Head: Eyes PERRLA, No xanthomas.   Normal cephalic and atramatic  Lungs:   Clear bilaterally to auscultation and percussion. Heart:   Irregularly irregular S1 S2 Pulses are 2+ & equal. Abdomen: Bowel sounds are positive, abdomen soft and non-tender without masses  Extremities:   No clubbing, cyanosis or edema.  DP +1 Neuro: Alert and oriented X 3. Psych:  Good affect, responds appropriately   LABS: Basic Metabolic Panel:  Basename 09/23/12 0518 09/22/12 1830  NA 138 141  K 3.5 4.1  CL 102 104  CO2 27 --  GLUCOSE 90 81  BUN 10 17  CREATININE 0.60 0.90  CALCIUM 10.4 --  MG -- --  PHOS -- --   Liver Function Tests:  Basename 09/23/12 0518  AST 22  ALT 14  ALKPHOS 48  BILITOT 0.5  PROT 6.4  ALBUMIN 3.3*   No results found for this basename: LIPASE:2,AMYLASE:2 in the last 72 hours CBC:  Basename 09/23/12 0518 09/22/12 1830 09/22/12 1759  WBC 7.4 -- 10.1  NEUTROABS -- -- --  HGB 14.2 14.3 --  HCT 41.6 42.0 --  MCV 92.9 -- 92.7  PLT 246 -- 275   Cardiac Enzymes:  Basename 09/23/12 0518 09/22/12 2318  CKTOTAL -- --  CKMB -- --  CKMBINDEX -- --  TROPONINI <0.30 <0.30   Coag Panel:   Lab Results  Component Value Date   INR 2.92* 09/23/2012   INR 3.23* 09/22/2012   INR 1.1 05/17/2009    RADIOLOGY: Dg Chest Port 1  View  09/22/2012  *RADIOLOGY REPORT*  Clinical Data: Bradycardia, syncopal episode, history of a-fib  PORTABLE CHEST - 1 VIEW  Comparison: 05/03/2009  Findings:  Grossly unchanged borderline enlarged cardiac silhouette.  Normal mediastinal contours.  No focal parenchymal opacities.  No pleural effusion or pneumothorax.  No definite evidence of pulmonary edema. Unchanged bones.  IMPRESSION: No acute cardiopulmonary disease.   Original Report Authenticated By: Waynard Reeds, M.D.       ASSESSMENT:  1.  Symptomatic bradycardia 2.  Chronic atrial fibrillation 3.  Systemic anticoagulation with therapeutic INR 4.  HTN 5.  GERD  PLAN:   1.  Continue to hold beta blocker/CCB and dig but ultimately needs these for rate control of afib 2.  Coumadin on hold 3.  EP consult for PPM  Quintella Reichert, MD  09/23/2012  8:35 AM

## 2012-09-23 NOTE — Care Management Note (Signed)
    Page 1 of 1   09/23/2012     12:44:05 PM   CARE MANAGEMENT NOTE 09/23/2012  Patient:  Megan [name], Megan [name]   Account Number:  [id number]  Date Initiated:  09/23/2012  Documentation initiated by:  Junius Creamer  Subjective/Objective Assessment:   adm w symptomatic bradycardia     Action/Plan:   lives w husband, pcp dr Severiano Gilbert   Anticipated DC Date:     Anticipated DC Plan:        DC Planning Services  CM consult      Choice offered to / List presented to:             Status of service:   Medicare Important Message given?   (If response is "NO", the following Medicare IM given date fields will be blank) Date Medicare IM given:   Date Additional Medicare IM given:    Discharge Disposition:    Per UR Regulation:  Reviewed for med. necessity/level of care/duration of stay  If discussed at Long Length of Stay Meetings, dates discussed:    Comments:  10/7 12:43p debbie Gabriele Loveland rn,bsn [phone]

## 2012-09-23 NOTE — Progress Notes (Signed)
  Echocardiogram 2D Echocardiogram has been performed.  Megan [name] 09/23/2012, 12:38 PM 

## 2012-09-24 ENCOUNTER — Encounter (HOSPITAL_COMMUNITY): Payer: Self-pay | Admitting: Internal Medicine

## 2012-09-24 DIAGNOSIS — I498 Other specified cardiac arrhythmias: Secondary | ICD-10-CM

## 2012-09-24 DIAGNOSIS — I495 Sick sinus syndrome: Principal | ICD-10-CM

## 2012-09-24 LAB — PROTIME-INR
INR: 1.88 — ABNORMAL HIGH (ref 0.00–1.49)
Prothrombin Time: 20.9 seconds — ABNORMAL HIGH (ref 11.6–15.2)

## 2012-09-24 MED ORDER — METOPROLOL TARTRATE 25 MG PO TABS
25.0000 mg | ORAL_TABLET | Freq: Two times a day (BID) | ORAL | Status: DC
  Administered 2012-09-24: 25 mg via ORAL
  Filled 2012-09-24: qty 1

## 2012-09-24 MED ORDER — SODIUM CHLORIDE 0.45 % IV SOLN
INTRAVENOUS | Status: DC
  Administered 2012-09-24 (×2): via INTRAVENOUS

## 2012-09-24 MED ORDER — SODIUM CHLORIDE 0.9 % IV SOLN: 250.0000 mL | INTRAVENOUS | Status: DC

## 2012-09-24 MED ORDER — CHLORHEXIDINE GLUCONATE 4 % EX LIQD
60.0000 mL | Freq: Once | CUTANEOUS | Status: AC
  Administered 2012-09-25: 4 via TOPICAL
  Filled 2012-09-24: qty 60

## 2012-09-24 MED ORDER — SODIUM CHLORIDE 0.9 % IJ SOLN: 3.0000 mL | Freq: Two times a day (BID) | INTRAMUSCULAR | Status: DC

## 2012-09-24 MED ORDER — CHLORHEXIDINE GLUCONATE 4 % EX LIQD
60.0000 mL | Freq: Once | CUTANEOUS | Status: AC
  Administered 2012-09-24: 4 via TOPICAL
  Filled 2012-09-24: qty 60
  Filled 2012-09-24: qty 15

## 2012-09-24 MED ORDER — METOPROLOL TARTRATE 50 MG PO TABS
50.0000 mg | ORAL_TABLET | Freq: Two times a day (BID) | ORAL | Status: DC
  Administered 2012-09-24 – 2012-09-26 (×3): 50 mg via ORAL
  Filled 2012-09-24 (×6): qty 1

## 2012-09-24 MED ORDER — SODIUM CHLORIDE 0.9 % IR SOLN
80.0000 mg | Status: DC
  Filled 2012-09-24: qty 2

## 2012-09-24 MED ORDER — CEFAZOLIN SODIUM-DEXTROSE 2-3 GM-% IV SOLR
2.0000 g | INTRAVENOUS | Status: DC
  Filled 2012-09-24: qty 50

## 2012-09-24 MED ORDER — SODIUM CHLORIDE 0.9 % IJ SOLN: 3.0000 mL | INTRAMUSCULAR | Status: DC | PRN

## 2012-09-24 MED ORDER — WARFARIN SODIUM 2.5 MG PO TABS
3.7500 mg | ORAL_TABLET | Freq: Once | ORAL | Status: AC
  Administered 2012-09-24: 3.75 mg via ORAL
  Filled 2012-09-24: qty 1

## 2012-09-24 MED ORDER — WARFARIN - PHARMACIST DOSING INPATIENT: Freq: Every day | Status: DC

## 2012-09-24 NOTE — Progress Notes (Signed)
Patients heart rate goes up to140-160's with minimal movement. Once patient lays back in the bed heart rate goes down to 80-90' but patient feels very tired. Md aware. Will continue to monitor.   Shon Indelicato, Charlaine Dalton RN

## 2012-09-24 NOTE — Consult Note (Signed)
ELECTROPHYSIOLOGY CONSULT NOTE     Primary Care Physician: Allean Found, MD Referring Physician:  Dr Mayford Knife  Admit Date: 09/22/2012  Reason for consultation: symptomatic bradycardia  Megan [name] is a 78 y.o. female with a h/o permanent atrial fibrillation who is admitted with tachycardia/ bradycardia syndrome.  She reports that she has had afib for more than 10 years and has been treated with a strategy of rate control and anticoagulation with coumadin.  Rate control has been previously difficult and has required metoprolol, verapamil, and digoxin.  Event when taking this regimen, she has previously had episodes of tachypalpitations and fatigue.  She reports that this is most noticeable when eating foods with "preservatives".  Her spouse notes that her exercise tolerance is intermittently depressed. She was admitted after developing symptoms of weakness and fatigue on Sunday.  She was at church and felt that she may collapse.  She presented to Urgent care and was found to be bradycardic.  She was therefore admitted to [facility] and observed to have symptomatic bradycardia with heart rates 40s-60s.  Her digoxin, veramapil, and metoprolol were discontinued.  She now has developed return of rapid ventricular rates.  With standing or minimal exertion, her heart rates quickly elevated to the 160s-170s.  She has been placed back on metoprolol but continues to have tachy palpitations.  EP is therefore consulted for tachy/brady syndrome.  Today, she denies symptoms of chest pain, shortness of breath, orthopnea, PND, lower extremity edema, or neurologic sequela. The patient is otherwise without complaint today.   Past Medical History  Diagnosis Date  . Permanent atrial fibrillation   . Benign hypertensive heart disease without heart failure   . Blind   . Tachycardia-bradycardia syndrome   . GERD (gastroesophageal reflux disease)   . Dyslipidemia   . Current use of long term anticoagulation    . Hypertension    Past Surgical History  Procedure Date  . Gallbladder surgery   . Abdominal hysterectomy   . Partial colectomy     for diverticulosis       . fenofibrate  160 mg Oral Daily  . hydrochlorothiazide  12.5 mg Oral Daily  . metoprolol tartrate  50 mg Oral BID  . sodium chloride  3 mL Intravenous Q12H  . warfarin  3.75 mg Oral ONCE-1800  . Warfarin - Pharmacist Dosing Inpatient   Does not apply q1800  . DISCONTD: metoprolol tartrate  25 mg Oral BID      . DISCONTD: sodium chloride 10 mL (09/22/12 2345)    No Known Allergies  History   Social History  . Marital Status: Married    Spouse Name: N/A    Number of Children: N/A  . Years of Education: N/A   Occupational History  . Not on file.   Social History Main Topics  . Smoking status: Never Smoker   . Smokeless tobacco: Not on file  . Alcohol Use: No  . Drug Use: No  . Sexually Active:    Other Topics Concern  . Not on file   Social History Narrative   Lives with spouse in [place], Retired from [employer]    Family History  Problem Relation Age of Onset  . Hypertension      ROS- All systems are reviewed and negative except as per the HPI above  Physical Exam: Telemetry: Filed Vitals:   09/24/12 0800 09/24/12 1008 09/24/12 1159 09/24/12 1608  BP: 149/65 149/65    Pulse: 95 145    Temp:  97.8 F (36.6 C) 97.9 F (36.6 C)  TempSrc:   Oral Oral  Resp:   18   Height:      Weight:      SpO2: 99%  99% 95%    GEN- The patient is elderly appearing, alert and oriented x 3 today.   Head- normocephalic, atraumatic Eyes-  blind Ears- hearing intact Oropharynx- clear Neck- supple,   Lungs- Clear to ausculation bilaterally, normal work of breathing Heart- tachycardic irregular rhythm, no murmurs, rubs or gallops, PMI not laterally displaced GI- soft, NT, ND, + BS Extremities- no clubbing, cyanosis, or edema MS- age appropriate atrophy Skin- no rash or lesion Psych- euthymic mood, full  affect Neuro- strength and sensation are intact  EKG 09/22/12 reveals atrial fibrillation with ventricular rates 40s-50s, QRS 110 msec, no ischemic changes  Echo 09/23/12- EF 55-60%, mild AI, trivial MR, LA size 37mm  Labs:   Lab Results  Component Value Date   WBC 7.4 09/23/2012   HGB 14.2 09/23/2012   HCT 41.6 09/23/2012   MCV 92.9 09/23/2012   PLT 246 09/23/2012    Lab 09/23/12 0518  NA 138  K 3.5  CL 102  CO2 27  BUN 10  CREATININE 0.60  CALCIUM 10.4  PROT 6.4  BILITOT 0.5  ALKPHOS 48  ALT 14  AST 22  GLUCOSE 90   Lab Results  Component Value Date   TROPONINI <0.30 09/23/2012    ASSESSMENT AND PLAN:   1. Atrial fibrillation The patient has longstanding persistent and likely permanent atrial fibrillation.  She has been treated previously with a strategy of rate control and anticoagulation.  Given her prolonged atrial fibrillation, I think that our ability to achieve or maintain sinus rhythm would be low.  She has minimal symptoms with her atrial fibrillation chronically when rates are controlled. I would therefore recommend that we continue rate control long term. Continue coumadin (goal INR 2-3)  2. Tachycardia bradycardia syndrome The patient was admitted with symptomatic bradycardia with HR 40s.  Upon holding her AV nodal agents, her heart rate is now very elevated, particularly with minimal exertion despite resuming metoprolol.  I will increase metoprolol at this time. My concern is that even when taking digoxin/ verapamil/ and metoprolol that she reports having occasional episodes of tachypalpitations with symptoms in the past.  I anticipate that she will require backup pacing support in order to achieve appropriate heart rate control going forward. Risks, benefits, alternatives to pacemaker implantation were discussed in detail with the patient and her spouse today. The patient understands that the risks include but are not limited to bleeding, infection, pneumothorax,  perforation, tamponade, vascular damage, renal failure, MI, stroke, death,  and lead dislodgement and wishes to proceed.  I will increase metoprolol to 50mg  BID today.  Dr Graciela Husbands will reassess in the am.  I anticipate that she will likely require PPM and therefore will go ahead and schedule her tentatively for the procedure with him tomorrow.    Hillis Range, MD 09/24/2012  5:28 PM

## 2012-09-24 NOTE — Progress Notes (Signed)
ANTICOAGULATION CONSULT NOTE - Initial Consult  Pharmacy Consult for warfarin Indication: atrial fibrillation  No Known Allergies  Patient Measurements: Height: 5\' 5"  (165.1 cm) Weight: 160 lb 4.4 oz (72.7 kg) IBW/kg (Calculated) : 57   Vital Signs: Temp: 97.8 F (36.6 C) (10/08 0757) Temp src: Oral (10/08 0757) BP: 149/65 mmHg (10/08 0800) Pulse Rate: 95  (10/08 0800)  Labs:  Basename 09/24/12 0715 09/23/12 0518 09/22/12 2318 09/22/12 1830 09/22/12 1759  HGB -- 14.2 -- 14.3 --  HCT -- 41.6 -- 42.0 41.9  PLT -- 246 -- -- 275  APTT -- 63* -- -- --  LABPROT 20.9* 29.0* -- -- 31.2*  INR 1.88* 2.92* -- -- 3.23*  HEPARINUNFRC -- -- -- -- --  CREATININE -- 0.60 -- 0.90 --  CKTOTAL -- -- -- -- --  CKMB -- -- -- -- --  TROPONINI -- <0.30 <0.30 -- --    Estimated Creatinine Clearance: 59.8 ml/min (by C-G formula based on Cr of 0.6).   Medical History: Past Medical History  Diagnosis Date  . Atrial fibrillation   . Benign hypertensive heart disease without heart failure   . Blind   . Atrial fibrillation   . Symptomatic bradycardia   . GERD (gastroesophageal reflux disease)   . Dyslipidemia   . Current use of long term anticoagulation   . Hypertension     Medications:  Prescriptions prior to admission  Medication Sig Dispense Refill  . digoxin (LANOXIN) 0.25 MG tablet Take 0.25 mg by mouth daily.      . famotidine (PEPCID) 10 MG tablet Take 10 mg by mouth daily as needed. For acid reflux      . fenofibrate 160 MG tablet Take 160 mg by mouth daily.      . hydrochlorothiazide (MICROZIDE) 12.5 MG capsule Take 12.5 mg by mouth daily.      . metoprolol (LOPRESSOR) 50 MG tablet Take 50 mg by mouth 2 (two) times daily.      . Multiple Vitamins-Minerals (ICAPS AREDS FORMULA PO) Take 2 capsules by mouth daily.       . verapamil (CALAN-SR) 240 MG CR tablet Take 240 mg by mouth daily.       . Vitamin D, Ergocalciferol, (DRISDOL) 50000 UNITS CAPS Take 50,000 Units by mouth once  a week. Does not take on any specific day per husband      . warfarin (COUMADIN) 2.5 MG tablet Take 2.5-3.75 mg by mouth every evening. Take 2.5 mg Sun, Tues, Thurs and Sat, take 3.75 MG Monday, Wed and Friday        Assessment: 16 yof admitted with bradycardia on chronic coumadin for afib. INR was initially supratherapeutic and has been on hold since admission. Her last dose was taken 10/5. Plan to restart coumadin tonight. Todays INR is 1.88. No bleeding noted.   Goal of Therapy:  INR 2-3   Plan:  1. Coumadin 3.75mg  PO x 1 tonight 2. Daily INR  Megan [name], Megan [name] 09/24/2012,9:57 AM

## 2012-09-24 NOTE — Progress Notes (Signed)
SUBJECTIVE:  Heart rates now in the 80-90's off all AV nodal blocking agents  OBJECTIVE:   Vitals:   Filed Vitals:   09/24/12 0400 09/24/12 0500 09/24/12 0600 09/24/12 0757  BP: 125/80     Pulse: 69 75 65   Temp: 97.9 F (36.6 C)   97.8 F (36.6 C)  TempSrc:    Oral  Resp: 19     Height:      Weight:      SpO2: 97% 98% 96%    I&O's:   Intake/Output Summary (Last 24 hours) at 09/24/12 0853 Last data filed at 09/24/12 0400  Gross per 24 hour  Intake    480 ml  Output   1600 ml  Net  -1120 ml   TELEMETRY: Reviewed telemetry pt in atrial fibrillation     PHYSICAL EXAM General: Well developed, well nourished, in no acute distress Head: Eyes PERRLA, No xanthomas.   Normal cephalic and atramatic  Lungs:   Clear bilaterally to auscultation and percussion. Heart:   Irregularly irregularS1 S2 Pulses are 2+ & equal. Abdomen: Bowel sounds are positive, abdomen soft and non-tender without masses Extremities:   No clubbing, cyanosis or edema.  DP +1 Neuro: Alert and oriented X 3. Psych:  Good affect, responds appropriately   LABS: Basic Metabolic Panel:  Basename 09/23/12 0518 09/22/12 1830  NA 138 141  K 3.5 4.1  CL 102 104  CO2 27 --  GLUCOSE 90 81  BUN 10 17  CREATININE 0.60 0.90  CALCIUM 10.4 --  MG -- --  PHOS -- --   Liver Function Tests:  Basename 09/23/12 0518  AST 22  ALT 14  ALKPHOS 48  BILITOT 0.5  PROT 6.4  ALBUMIN 3.3*   No results found for this basename: LIPASE:2,AMYLASE:2 in the last 72 hours CBC:  Basename 09/23/12 0518 09/22/12 1830 09/22/12 1759  WBC 7.4 -- 10.1  NEUTROABS -- -- --  HGB 14.2 14.3 --  HCT 41.6 42.0 --  MCV 92.9 -- 92.7  PLT 246 -- 275   Cardiac Enzymes:  Basename 09/23/12 0518 09/22/12 2318  CKTOTAL -- --  CKMB -- --  CKMBINDEX -- --  TROPONINI <0.30 <0.30   BNP: No components found with this basename: POCBNP:3 D-Dimer: No results found for this basename: DDIMER:2 in the last 72 hours Hemoglobin  A1C:  Basename 09/23/12 0518  HGBA1C 5.7*   Fasting Lipid Panel: No results found for this basename: CHOL,HDL,LDLCALC,TRIG,CHOLHDL,LDLDIRECT in the last 72 hours Thyroid Function Tests:  Basename 09/23/12 0518  TSH 1.691  T4TOTAL --  T3FREE --  THYROIDAB --   Anemia Panel: No results found for this basename: VITAMINB12,FOLATE,FERRITIN,TIBC,IRON,RETICCTPCT in the last 72 hours Coag Panel:   Lab Results  Component Value Date   INR 1.88* 09/24/2012   INR 2.92* 09/23/2012   INR 3.23* 09/22/2012    RADIOLOGY: Dg Chest Port 1 View  09/22/2012  *RADIOLOGY REPORT*  Clinical Data: Bradycardia, syncopal episode, history of a-fib  PORTABLE CHEST - 1 VIEW  Comparison: 05/03/2009  Findings:  Grossly unchanged borderline enlarged cardiac silhouette.  Normal mediastinal contours.  No focal parenchymal opacities.  No pleural effusion or pneumothorax.  No definite evidence of pulmonary edema. Unchanged bones.  IMPRESSION: No acute cardiopulmonary disease.   Original Report Authenticated By: Waynard Reeds, M.D.       ASSESSMENT:  1. Symptomatic bradycardia - resolved off AV nodal blocking agents 2. Chronic atrial fibrillation currently rate controlled 3. Systemic anticoagulation with subtherapeutic INR  4. HTN  5. GERD   PLAN:   1.  Discussed with EP.  Need to let AV nodal blocking agents completely wash out.  If she develops tachycardia then will need to slowly readd beta blockers.  If she then develops bradycardia will need PPM 2.  Restart Coumadin 3.  Transfer to tele bed  Quintella Reichert, MD  09/24/2012  8:53 AM

## 2012-09-25 ENCOUNTER — Encounter (HOSPITAL_COMMUNITY)
Admission: EM | Disposition: A | Discharge status: Home / Self Care | Payer: Self-pay | Source: Home / Self Care | Attending: Cardiology

## 2012-09-25 DIAGNOSIS — I495 Sick sinus syndrome: Secondary | ICD-10-CM

## 2012-09-25 HISTORY — PX: PERMANENT PACEMAKER INSERTION: SHX5480

## 2012-09-25 LAB — PROTIME-INR
INR: 1.51 — ABNORMAL HIGH (ref 0.00–1.49)
Prothrombin Time: 17.8 s — ABNORMAL HIGH (ref 11.6–15.2)

## 2012-09-25 SURGERY — PERMANENT PACEMAKER INSERTION
Anesthesia: LOCAL | Laterality: Left

## 2012-09-25 MED ORDER — CEFAZOLIN SODIUM 1-5 GM-% IV SOLN
1.0000 g | Freq: Four times a day (QID) | INTRAVENOUS | Status: AC
  Administered 2012-09-25 – 2012-09-26 (×3): 1 g via INTRAVENOUS
  Filled 2012-09-25 (×3): qty 50

## 2012-09-25 MED ORDER — MIDAZOLAM HCL 5 MG/5ML IJ SOLN
INTRAMUSCULAR | Status: AC
  Filled 2012-09-25: qty 5

## 2012-09-25 MED ORDER — LIDOCAINE HCL (PF) 1 % IJ SOLN
INTRAMUSCULAR | Status: AC
  Filled 2012-09-25: qty 60

## 2012-09-25 MED ORDER — CEFAZOLIN SODIUM 1-5 GM-% IV SOLN
INTRAVENOUS | Status: AC
  Administered 2012-09-25: 1 g via INTRAVENOUS
  Filled 2012-09-25: qty 100

## 2012-09-25 MED ORDER — FENTANYL CITRATE 0.05 MG/ML IJ SOLN
INTRAMUSCULAR | Status: AC
  Filled 2012-09-25: qty 2

## 2012-09-25 MED ORDER — VERAPAMIL HCL ER 240 MG PO TBCR
240.0000 mg | EXTENDED_RELEASE_TABLET | Freq: Every day | ORAL | Status: DC
  Administered 2012-09-25 – 2012-09-26 (×2): 240 mg via ORAL
  Filled 2012-09-25 (×2): qty 1

## 2012-09-25 MED ORDER — ACETAMINOPHEN 325 MG PO TABS: 325.0000 mg | ORAL_TABLET | ORAL | Status: DC | PRN

## 2012-09-25 MED ORDER — ONDANSETRON HCL 4 MG/2ML IJ SOLN: 4.0000 mg | Freq: Four times a day (QID) | INTRAMUSCULAR | Status: DC | PRN

## 2012-09-25 MED ORDER — WARFARIN SODIUM 4 MG PO TABS
4.0000 mg | ORAL_TABLET | Freq: Once | ORAL | Status: AC
  Administered 2012-09-25: 4 mg via ORAL
  Filled 2012-09-25 (×2): qty 1

## 2012-09-25 MED ORDER — HEPARIN (PORCINE) IN NACL 2-0.9 UNIT/ML-% IJ SOLN
INTRAMUSCULAR | Status: AC
  Filled 2012-09-25: qty 500

## 2012-09-25 NOTE — CV Procedure (Signed)
Megan [name] [id number]  [id number]  Preop [id number] Postop Dx same/  Cx: none apparent   Procedure single pacemaker implantation  After routine prep and drape, lidocaine was infiltrated in the prepectoral subclavicular region on the left side an incision was made and carried down to later the prepectoral fascia using electrocautery and sharp dissection a pocket was formed similarly. Hemostasis was obtained.  After this, we turned our attention to gaining accessm to the extrathoracic,left subclavian vein. This was accomplished without difficulty and without the aspiration of air or puncture of the artery. 2 separate venipunctures were accomplished; guidewires were placed and retained and sequentially 7 French sheath through which were  passed an [facility] 2088  ventricular lead serial number [id number]  .  The ventricular lead was manipulated to the right ventricular apex with a bipolar R wave was 12.9,  the pacing impedance was 531, the threshold was 0.7 @ 0.4 msec  Current at threshold was   1.3  Ma and the current of injury was  modest.  The lead was affixed to the prepectoral fascia and attached to a  [employer] pulse generator  [employer] Serial number [id number] .  Hemostasis was obtained. The pocket was copiously irrigated with antibiotic containing saline solution. The leads and the pulse generator were placed in the pocket and affixed to the prepectoral fascia. The wound iwas then closed in 2  layers in normal fashion. The wound was washed dried and a benzoin Steri-Strip dressing  was applied.  Needle count, sponge count  and instrument counts were correct at the end of the procedure .Marland Kitchen The patient tolerated the procedure without apparent complication.

## 2012-09-25 NOTE — Progress Notes (Signed)
SUBJECTIVE:  Just back from PPM insertion OBJECTIVE:   Vitals:   Filed Vitals:   09/24/12 2243 09/25/12 0006 09/25/12 0337 09/25/12 0356  BP: 122/66 117/79 141/78   Pulse: 98     Temp:  97.6 F (36.4 C)  98.7 F (37.1 C)  TempSrc:  Oral  Oral  Resp:  16 18   Height:      Weight:      SpO2:  99%  98%   I&O's:   Intake/Output Summary (Last 24 hours) at 09/25/12 1128 Last data filed at 09/25/12 0700  Gross per 24 hour  Intake    570 ml  Output    850 ml  Net   -280 ml   TELEMETRY: Reviewed telemetry pt in atrial fibrillation     PHYSICAL EXAM General: Well developed, well nourished, in no acute distress Head: Eyes PERRLA, No xanthomas.   Normal cephalic and atramatic  Lungs:   Clear bilaterally to auscultation and percussion. Heart:   HRRR S1 S2 Pulses are 2+ & equal. Abdomen: Bowel sounds are positive, abdomen soft and non-tender without masses Extremities:   No clubbing, cyanosis or edema.  DP +1 Neuro: Alert and oriented X 3. Psych:  Good affect, responds appropriately   LABS: Basic Metabolic Panel:  Basename 09/23/12 0518 09/22/12 1830  NA 138 141  K 3.5 4.1  CL 102 104  CO2 27 --  GLUCOSE 90 81  BUN 10 17  CREATININE 0.60 0.90  CALCIUM 10.4 --  MG -- --  PHOS -- --   Liver Function Tests:  Basename 09/23/12 0518  AST 22  ALT 14  ALKPHOS 48  BILITOT 0.5  PROT 6.4  ALBUMIN 3.3*   No results found for this basename: LIPASE:2,AMYLASE:2 in the last 72 hours CBC:  Basename 09/23/12 0518 09/22/12 1830 09/22/12 1759  WBC 7.4 -- 10.1  NEUTROABS -- -- --  HGB 14.2 14.3 --  HCT 41.6 42.0 --  MCV 92.9 -- 92.7  PLT 246 -- 275   Cardiac Enzymes:  Basename 09/23/12 0518 09/22/12 2318  CKTOTAL -- --  CKMB -- --  CKMBINDEX -- --  TROPONINI <0.30 <0.30   BNP: No components found with this basename: POCBNP:3 D-Dimer: No results found for this basename: DDIMER:2 in the last 72 hours Hemoglobin A1C:  Basename 09/23/12 0518  HGBA1C 5.7*    Fasting Lipid Panel: No results found for this basename: CHOL,HDL,LDLCALC,TRIG,CHOLHDL,LDLDIRECT in the last 72 hours Thyroid Function Tests:  Basename 09/23/12 0518  TSH 1.691  T4TOTAL --  T3FREE --  THYROIDAB --   Anemia Panel: No results found for this basename: VITAMINB12,FOLATE,FERRITIN,TIBC,IRON,RETICCTPCT in the last 72 hours Coag Panel:   Lab Results  Component Value Date   INR 1.51* 09/25/2012   INR 1.88* 09/24/2012   INR 2.92* 09/23/2012    RADIOLOGY: Dg Chest Port 1 View  09/22/2012  *RADIOLOGY REPORT*  Clinical Data: Bradycardia, syncopal episode, history of a-fib  PORTABLE CHEST - 1 VIEW  Comparison: 05/03/2009  Findings:  Grossly unchanged borderline enlarged cardiac silhouette.  Normal mediastinal contours.  No focal parenchymal opacities.  No pleural effusion or pneumothorax.  No definite evidence of pulmonary edema. Unchanged bones.  IMPRESSION: No acute cardiopulmonary disease.   Original Report Authenticated By: Waynard Reeds, M.D.       ASSESSMENT:  1. Symptomatic bradycardia - resolved off AV nodal blocking agents  2. Chronic atrial fibrillation currently rate controlled  3. Systemic anticoagulation with subtherapeutic INR  4. HTN  5. GERD      PLAN:   1.  Reloading Coumadin per pharmacy 2.  Continue Metoprolol at higher dose of 50mg  BID 3.  Home in am  Quintella Reichert, MD  09/25/2012  11:28 AM

## 2012-09-25 NOTE — Progress Notes (Signed)
ANTICOAGULATION CONSULT NOTE - Follow-up  Pharmacy Consult for warfarin Indication: atrial fibrillation  No Known Allergies  Patient Measurements: Height: 5\' 5"  (165.1 cm) Weight: 160 lb 4.4 oz (72.7 kg) IBW/kg (Calculated) : 57   Vital Signs: Temp: 98.7 F (37.1 C) (10/09 0356) Temp src: Oral (10/09 0356) BP: 141/78 mmHg (10/09 0337) Pulse Rate: 98  (10/08 2243)  Labs:  Alvira Philips 09/25/12 0535 09/24/12 0715 09/23/12 0518 09/22/12 2318 09/22/12 1830 09/22/12 1759  HGB -- -- 14.2 -- 14.3 --  HCT -- -- 41.6 -- 42.0 41.9  PLT -- -- 246 -- -- 275  APTT -- -- 63* -- -- --  LABPROT 17.8* 20.9* 29.0* -- -- --  INR 1.51* 1.88* 2.92* -- -- --  HEPARINUNFRC -- -- -- -- -- --  CREATININE -- -- 0.60 -- 0.90 --  CKTOTAL -- -- -- -- -- --  CKMB -- -- -- -- -- --  TROPONINI -- -- <0.30 <0.30 -- --    Estimated Creatinine Clearance: 59.8 ml/min (by C-G formula based on Cr of 0.6).  Assessment: 7 yof admitted with bradycardia on chronic coumadin for afib. INR was initially supratherapeutic and has been on hold since admission until 10/8. Her last dose PTA was taken 10/5. Coumadin restarted last night. Todays INR is low at 1.51. No bleeding noted.   Goal of Therapy:  INR 2-3   Plan:  1. Coumadin 4mg  PO x 1 tonight 2. Daily INR  Meet [place], Drake Leach 09/25/2012,9:40 AM

## 2012-09-25 NOTE — Progress Notes (Signed)
Patient Name: Megan [name]      SUBJECTIVE: contiues with tachy-brady HR 160 and 50 overnight    Past Medical History  Diagnosis Date  . Permanent atrial fibrillation   . Benign hypertensive heart disease without heart failure   . Blind   . Tachycardia-bradycardia syndrome   . GERD (gastroesophageal reflux disease)   . Dyslipidemia   . Current use of long term anticoagulation   . Hypertension     PHYSICAL EXAM Filed Vitals:   09/24/12 2243 09/25/12 0006 09/25/12 0337 09/25/12 0356  BP: 122/66 117/79 141/78   Pulse: 98     Temp:  97.6 F (36.4 C)  98.7 F (37.1 C)  TempSrc:  Oral  Oral  Resp:  16 18   Height:      Weight:      SpO2:  99%  98%    Well developed and nourished in no acute distress HENT normal Neck supple with JVP-flat Carotids brisk and full without bruits Clear Irregularly irregular rate and rhythm with controlled ventricular response, no murmurs or gallops Abd-soft with active BS without hepatomegaly No Clubbing cyanosis edema Skin-warm and dry A & Oriented  Grossly normal sensory and motor function  TELEMETRY: Reviewed telemetry pt in af :    Intake/Output Summary (Last 24 hours) at 09/25/12 0810 Last data filed at 09/25/12 0700  Gross per 24 hour  Intake    570 ml  Output    850 ml  Net   -280 ml    LABS: Basic Metabolic Panel:  Lab 09/23/12 0865 09/22/12 1830  NA 138 141  K 3.5 4.1  CL 102 104  CO2 27 --  GLUCOSE 90 81  BUN 10 17  CREATININE 0.60 0.90  CALCIUM 10.4 --  MG -- --  PHOS -- --   Cardiac Enzymes:  Basename 09/23/12 0518 09/22/12 2318  CKTOTAL -- --  CKMB -- --  CKMBINDEX -- --  TROPONINI <0.30 <0.30   CBC:  Lab 09/23/12 0518 09/22/12 1830 09/22/12 1759  WBC 7.4 -- 10.1  NEUTROABS -- -- --  HGB 14.2 14.3 14.3  HCT 41.6 42.0 41.9  MCV 92.9 -- 92.7  PLT 246 -- 275   PROTIME:  Basename 09/25/12 0535 09/24/12 0715 09/23/12 0518  LABPROT 17.8* 20.9* 29.0*  INR 1.51* 1.88* 2.92*   Liver Function  Tests:  Basename 09/23/12 0518  AST 22  ALT 14  ALKPHOS 48  BILITOT 0.5  PROT 6.4  ALBUMIN 3.3*   No results found for this basename: LIPASE:2,AMYLASE:2 in the last 72 hours BNP: BNP (last 3 results) No results found for this basename: PROBNP:3 in the last 8760 hours D-Dimer: No results found for this basename: DDIMER:2 in the last 72 hours Hemoglobin A1C:  Basename 09/23/12 0518  HGBA1C 5.7*   Fasting Lipid Panel: No results found for this basename: CHOL,HDL,LDLCALC,TRIG,CHOLHDL,LDLDIRECT in the last 72 hours Thyroid Function Tests:  Basename 09/23/12 0518  TSH 1.691  T4TOTAL --  T3FREE --  THYROIDAB --   Anemia Panel: No results found for this basename: VITAMINB12,FOLATE,FERRITIN,TIBC,IRON,RETICCTPCT in the last 72 hours   Device Interrogation:   ASSESSMENT AND PLAN:  Patient Active Hospital Problem List: Symptomatic bradycardia (09/22/2012)   Assessment: iatrogenic from meds for rapid afib, the withdrawal of which has been associeated with RVR   Plan: resume post pacing Atrial fibrillation (09/22/2012)   Assessment: permanent   Plan: continue coumadin; resume Tachycardia-bradycardia syndrome (09/24/2012)   Assessment: As above    Plan: As above  Hypertrophic  Cardiomyopathy She has asymmetric hypertrophy with prominence of the basalar septum.  Consideration of HCM as diagnosis should be made though i am struck also by the realtively low voltage    Signed, Sherryl Manges MD  09/25/2012

## 2012-09-26 ENCOUNTER — Encounter: Payer: Self-pay | Admitting: *Deleted

## 2012-09-26 ENCOUNTER — Inpatient Hospital Stay (HOSPITAL_COMMUNITY): Payer: Medicare Other

## 2012-09-26 DIAGNOSIS — Z950 Presence of cardiac pacemaker: Secondary | ICD-10-CM | POA: Insufficient documentation

## 2012-09-26 LAB — PROTIME-INR: INR: 1.72 — ABNORMAL HIGH (ref 0.00–1.49)

## 2012-09-26 MED ORDER — WARFARIN SODIUM 4 MG PO TABS
4.0000 mg | ORAL_TABLET | Freq: Once | ORAL | Status: DC
  Filled 2012-09-26: qty 1

## 2012-09-26 NOTE — Progress Notes (Signed)
Patient's heart rate increased to 120's up to 160's.  Dr. Graciela Husbands and Dr. Mayford Knife notified.

## 2012-09-26 NOTE — Progress Notes (Signed)
  Patient Name: Megan [name]      SUBJECTIVE: feeling fine with minimal discomfort  Past Medical History  Diagnosis Date  . Permanent atrial fibrillation   . Benign hypertensive heart disease without heart failure   . Blind   . Tachycardia-bradycardia syndrome   . GERD (gastroesophageal reflux disease)   . Dyslipidemia   . Current use of long term anticoagulation   . Hypertension     PHYSICAL EXAM Filed Vitals:   09/25/12 1600 09/25/12 1843 09/25/12 2130 09/26/12 0515  BP: 136/73 143/75 129/81 141/86  Pulse: 90 91 70 94  Temp:   98.1 F (36.7 C) 98 F (36.7 C)  TempSrc:   Oral Oral  Resp:   18 20  Height:      Weight:      SpO2: 99% 97% 98% 100%  Well developed and nourished in no acute distress HENT normal Neck supple   Device pocket well healed; without hematoma or erythema Clear Regular rate and rhythm,   Skin-warm and dry A & Oriented  Grossly normal sensory and motor function\  TELEMETRY: Reviewed telemetry pt in afib with variable HR:    Intake/Output Summary (Last 24 hours) at 09/26/12 0841 Last data filed at 09/25/12 1730  Gross per 24 hour  Intake    590 ml  Output      0 ml  Net    590 ml    LABS: Basic Metabolic Panel:  Lab 09/23/12 1610 09/22/12 1830  NA 138 141  K 3.5 4.1  CL 102 104  CO2 27 --  GLUCOSE 90 81  BUN 10 17  CREATININE 0.60 0.90  CALCIUM 10.4 --  MG -- --  PHOS -- --   Cardiac Enzymes: No results found for this basename: CKTOTAL:3,CKMB:3,CKMBINDEX:3,TROPONINI:3 in the last 72 hours CBC:  Lab 09/23/12 0518 09/22/12 1830 09/22/12 1759  WBC 7.4 -- 10.1  NEUTROABS -- -- --  HGB 14.2 14.3 14.3  HCT 41.6 42.0 41.9  MCV 92.9 -- 92.7  PLT 246 -- 275   PROTIME:  Basename 09/26/12 0654 09/25/12 0535 09/24/12 0715  LABPROT 19.6* 17.8* 20.9*  INR 1.72* 1.51* 1.88*    Device Interrogation: normal device functon   ASSESSMENT AND PLAN:  Tachybrady now sp pacing with intermitttent rapid rates, would augment rate  control   Will sign off   Signed, Sherryl Manges MD  09/26/2012

## 2012-09-26 NOTE — Progress Notes (Signed)
ANTICOAGULATION CONSULT NOTE - Follow Up Consult  Pharmacy Consult for Coumadin Indication: atrial fibrillation  No Known Allergies  Patient Measurements: Height: 5\' 5"  (165.1 cm) Weight: 160 lb 4.4 oz (72.7 kg) IBW/kg (Calculated) : 57    Vital Signs: Temp: 98 F (36.7 C) (10/10 0515) Temp src: Oral (10/10 0515) BP: 141/86 mmHg (10/10 0515) Pulse Rate: 94  (10/10 0515)  Labs:  Basename 09/26/12 0654 09/25/12 0535 09/24/12 0715  HGB -- -- --  HCT -- -- --  PLT -- -- --  APTT -- -- --  LABPROT 19.6* 17.8* 20.9*  INR 1.72* 1.51* 1.88*  HEPARINUNFRC -- -- --  CREATININE -- -- --  CKTOTAL -- -- --  CKMB -- -- --  TROPONINI -- -- --    Estimated Creatinine Clearance: 59.8 ml/min (by C-G formula based on Cr of 0.6).   Assessment: Patient is a 78 y.o F on coumadin for chronic afib s/p PPM on 10/09.  INR is trending up towards goal range.    Goal of Therapy:  INR 2-3  Plan:  1) coumadin 4 mg PO x1 today to get patient therapeutic 2) Recommend to resume patient's PTA regimen of 2.5mg  daily except 3.75mg  on MWF starting tomorrow if to be discharged today.  Madalee Altmann P 09/26/2012,8:16 AM

## 2012-09-27 DIAGNOSIS — I4891 Unspecified atrial fibrillation: Secondary | ICD-10-CM | POA: Diagnosis not present

## 2012-09-27 DIAGNOSIS — Z79899 Other long term (current) drug therapy: Secondary | ICD-10-CM | POA: Diagnosis not present

## 2012-09-27 DIAGNOSIS — Z7901 Long term (current) use of anticoagulants: Secondary | ICD-10-CM | POA: Diagnosis not present

## 2012-09-27 DIAGNOSIS — I119 Hypertensive heart disease without heart failure: Secondary | ICD-10-CM | POA: Diagnosis not present

## 2012-09-30 NOTE — Discharge Summary (Signed)
Patient ID: Megan [name] MRN: [id number] DOB/AGE: 1933/12/17 78 y.o.  Admit date: 09/22/2012 Discharge date: 09/30/2012  Primary Discharge Diagnosis  Tachybrady syndrome with symptomatic bradycardia Secondary Discharge Diagnosis  Atrial fibrillation with RVR  Systemic anticoagulation  Hypertensive heart disease without heart failure  Legally Blind  GERD  Dyslipidemia       Consults: cardiology -EP  Hospital Course: This is a 78yo BF with a history of HTN and chronic atrial fibrillation who presented to [facility] with complaints of lightheadedness and presyncope.  This occurred in church and she was found to have a pulse in the low 40's and she was brought to the ER by her husband.  Her beta blocker, verapamil and digoxin were held with resolution of the bradycardia but then she started having atrial fibrillation with heart rates in the 140's requiring reinstitution of her AV nodal blocking agents.  She underwent PPM with a single chamber pacemaker on 09/25/2012 without complications.  She did well post op.  Her hear rate was still increasing with ambulation so digoxin was readded.  On the day of discharge she was doing well without complaints and was discharged to home in stable condition.     Discharge Exam: Blood pressure 141/86, pulse 94, temperature 98 F (36.7 C), temperature source Oral, resp. rate 20, height 5\' 5"  (1.651 m), weight 72.7 kg (160 lb 4.4 oz), SpO2 100.00%.   General appearance: alert Resp: clear to auscultation bilaterally Cardio: regular rate and rhythm, S1, S2 normal, no murmur, click, rub or [name] GI: soft, non-tender; bowel sounds normal; no masses,  no organomegaly Extremities: extremities normal, atraumatic, no cyanosis or edema Pacer site clean, dry and intact without any hematoma Labs:   Lab Results  Component Value Date   WBC 7.4 09/23/2012   HGB 14.2 09/23/2012   HCT 41.6 09/23/2012   MCV 92.9 09/23/2012   PLT 246 09/23/2012   No results found for this  basename: NA,K,CL,CO2,BUN,CREATININE,CALCIUM,LABALBU,PROT,BILITOT,ALKPHOS,ALT,AST,GLUCOSE in the last 168 hours Lab Results  Component Value Date   TROPONINI <0.30 09/23/2012    Radiology: *RADIOLOGY REPORT*  Clinical Data: Postop pacemaker implantation  CHEST - 2 VIEW  Comparison: Prior chest x-ray 09/22/2012  Findings: Interval placement of left subclavian approach cardiac  rhythm maintenance device. There is a single lead projecting over  the right ventricular apex. Lungs are well aerated and free from  edema, focal airspace consolidation, pleural effusion or  pneumothorax. Cardiac and mediastinal contours within normal  limits. Surgical clips the right upper quadrant suggest prior  cholecystectomy. No acute osseous finding.  IMPRESSION:  Interval placement of a left subclavian approach single lead  cardiac rhythm maintenance device without evidence of complication.  No acute cardiopulmonary disease.  Original Report Authenticated By: Sterling Big, M.D.  EKG:  Atrial fibrillation with nonspecific ST abnormality  FOLLOW UP PLANS AND APPOINTMENTS Discharge Orders    Future Orders Please Complete By Expires   Diet - low sodium heart healthy      Increase activity slowly      Leave dressing on - Keep it clean, dry, and intact until clinic visit          Medication List     As of 09/30/2012  9:48 AM    TAKE these medications         digoxin 0.25 MG tablet   Commonly known as: LANOXIN   Take 0.25 mg by mouth daily.      famotidine 10 MG tablet   Commonly known as: PEPCID  Take 10 mg by mouth daily as needed. For acid reflux      fenofibrate 160 MG tablet   Take 160 mg by mouth daily.      hydrochlorothiazide 12.5 MG capsule   Commonly known as: MICROZIDE   Take 12.5 mg by mouth daily.      ICAPS AREDS FORMULA PO   Take 2 capsules by mouth daily.      metoprolol 50 MG tablet   Commonly known as: LOPRESSOR   Take 50 mg by mouth 2 (two) times daily.       verapamil 240 MG CR tablet   Commonly known as: CALAN-SR   Take 240 mg by mouth daily.      Vitamin D (Ergocalciferol) 50000 UNITS Caps   Commonly known as: DRISDOL   Take 50,000 Units by mouth once a week. Does not take on any specific day per husband      warfarin 2.5 MG tablet   Commonly known as: COUMADIN   Take 2.5-3.75 mg by mouth every evening. Take 2.5 mg Sun, Tues, Thurs and Sat, take 3.75 MG Monday, Wed and Friday           Follow-up Information    Follow up with Megan Reichert, MD. On 09/27/2012. (at 9am)    Contact information:   [address] [place] [address] [phone]          BRING ALL MEDICATIONS WITH YOU TO FOLLOW UP APPOINTMENTS  Time spent with patient to include physician time: 30 minutes Signed: Malcom Selmer R 09/30/2012, 9:48 AM

## 2012-10-07 DIAGNOSIS — E559 Vitamin D deficiency, unspecified: Secondary | ICD-10-CM | POA: Diagnosis not present

## 2012-10-07 DIAGNOSIS — I10 Essential (primary) hypertension: Secondary | ICD-10-CM | POA: Diagnosis not present

## 2012-10-07 DIAGNOSIS — Z23 Encounter for immunization: Secondary | ICD-10-CM | POA: Diagnosis not present

## 2012-10-07 DIAGNOSIS — I4891 Unspecified atrial fibrillation: Secondary | ICD-10-CM | POA: Diagnosis not present

## 2012-10-07 DIAGNOSIS — E785 Hyperlipidemia, unspecified: Secondary | ICD-10-CM | POA: Diagnosis not present

## 2012-10-07 DIAGNOSIS — I119 Hypertensive heart disease without heart failure: Secondary | ICD-10-CM | POA: Diagnosis not present

## 2012-10-07 DIAGNOSIS — I498 Other specified cardiac arrhythmias: Secondary | ICD-10-CM | POA: Diagnosis not present

## 2012-10-10 ENCOUNTER — Ambulatory Visit (INDEPENDENT_AMBULATORY_CARE_PROVIDER_SITE_OTHER): Payer: Medicare Other | Admitting: *Deleted

## 2012-10-10 DIAGNOSIS — R001 Bradycardia, unspecified: Secondary | ICD-10-CM

## 2012-10-10 DIAGNOSIS — I4891 Unspecified atrial fibrillation: Secondary | ICD-10-CM

## 2012-10-10 DIAGNOSIS — I498 Other specified cardiac arrhythmias: Secondary | ICD-10-CM

## 2012-10-10 DIAGNOSIS — I495 Sick sinus syndrome: Secondary | ICD-10-CM | POA: Diagnosis not present

## 2012-10-10 LAB — PACEMAKER DEVICE OBSERVATION
BRDY-0005RV: 50 {beats}/min
RV LEAD AMPLITUDE: 11.4 mv
RV LEAD THRESHOLD: 0.75 V
VENTRICULAR PACING PM: 32

## 2012-10-10 NOTE — Progress Notes (Signed)
Wound check-PPM 

## 2012-10-14 DIAGNOSIS — I119 Hypertensive heart disease without heart failure: Secondary | ICD-10-CM | POA: Diagnosis not present

## 2012-10-14 DIAGNOSIS — I4891 Unspecified atrial fibrillation: Secondary | ICD-10-CM | POA: Diagnosis not present

## 2012-11-07 ENCOUNTER — Other Ambulatory Visit: Payer: Self-pay | Admitting: Family Medicine

## 2012-11-07 DIAGNOSIS — Z1231 Encounter for screening mammogram for malignant neoplasm of breast: Secondary | ICD-10-CM

## 2012-11-11 DIAGNOSIS — I4891 Unspecified atrial fibrillation: Secondary | ICD-10-CM | POA: Diagnosis not present

## 2012-11-11 DIAGNOSIS — Z7901 Long term (current) use of anticoagulants: Secondary | ICD-10-CM | POA: Diagnosis not present

## 2012-11-18 ENCOUNTER — Encounter: Payer: Self-pay | Admitting: Internal Medicine

## 2012-12-16 ENCOUNTER — Ambulatory Visit
Admission: RE | Admit: 2012-12-16 | Discharge: 2012-12-16 | Disposition: A | Discharge status: Home / Self Care | Payer: Medicare Other | Source: Ambulatory Visit | Attending: Family Medicine | Admitting: Family Medicine

## 2012-12-16 DIAGNOSIS — Z1231 Encounter for screening mammogram for malignant neoplasm of breast: Secondary | ICD-10-CM | POA: Diagnosis not present

## 2012-12-23 DIAGNOSIS — Z7901 Long term (current) use of anticoagulants: Secondary | ICD-10-CM | POA: Diagnosis not present

## 2012-12-23 DIAGNOSIS — Z79899 Other long term (current) drug therapy: Secondary | ICD-10-CM | POA: Diagnosis not present

## 2012-12-23 DIAGNOSIS — I4891 Unspecified atrial fibrillation: Secondary | ICD-10-CM | POA: Diagnosis not present

## 2012-12-23 DIAGNOSIS — I119 Hypertensive heart disease without heart failure: Secondary | ICD-10-CM | POA: Diagnosis not present

## 2013-02-03 DIAGNOSIS — E7800 Pure hypercholesterolemia, unspecified: Secondary | ICD-10-CM | POA: Diagnosis not present

## 2013-02-13 DIAGNOSIS — I4891 Unspecified atrial fibrillation: Secondary | ICD-10-CM | POA: Diagnosis not present

## 2013-02-13 DIAGNOSIS — Z7901 Long term (current) use of anticoagulants: Secondary | ICD-10-CM | POA: Diagnosis not present

## 2013-02-25 DIAGNOSIS — Z950 Presence of cardiac pacemaker: Secondary | ICD-10-CM | POA: Diagnosis not present

## 2013-02-25 DIAGNOSIS — I4891 Unspecified atrial fibrillation: Secondary | ICD-10-CM | POA: Diagnosis not present

## 2013-03-18 DIAGNOSIS — N9089 Other specified noninflammatory disorders of vulva and perineum: Secondary | ICD-10-CM | POA: Diagnosis not present

## 2013-03-28 DIAGNOSIS — J309 Allergic rhinitis, unspecified: Secondary | ICD-10-CM | POA: Diagnosis not present

## 2013-04-01 DIAGNOSIS — I4891 Unspecified atrial fibrillation: Secondary | ICD-10-CM | POA: Diagnosis not present

## 2013-04-01 DIAGNOSIS — Z7901 Long term (current) use of anticoagulants: Secondary | ICD-10-CM | POA: Diagnosis not present

## 2013-04-08 DIAGNOSIS — I4891 Unspecified atrial fibrillation: Secondary | ICD-10-CM | POA: Diagnosis not present

## 2013-04-08 DIAGNOSIS — E7800 Pure hypercholesterolemia, unspecified: Secondary | ICD-10-CM | POA: Diagnosis not present

## 2013-04-08 DIAGNOSIS — I10 Essential (primary) hypertension: Secondary | ICD-10-CM | POA: Diagnosis not present

## 2013-05-13 DIAGNOSIS — I4891 Unspecified atrial fibrillation: Secondary | ICD-10-CM | POA: Diagnosis not present

## 2013-05-13 DIAGNOSIS — Z7901 Long term (current) use of anticoagulants: Secondary | ICD-10-CM | POA: Diagnosis not present

## 2013-05-20 DIAGNOSIS — E119 Type 2 diabetes mellitus without complications: Secondary | ICD-10-CM | POA: Diagnosis not present

## 2013-05-20 DIAGNOSIS — M12279 Villonodular synovitis (pigmented), unspecified ankle and foot: Secondary | ICD-10-CM | POA: Diagnosis not present

## 2013-05-20 DIAGNOSIS — M659 Synovitis and tenosynovitis, unspecified: Secondary | ICD-10-CM | POA: Diagnosis not present

## 2013-05-20 DIAGNOSIS — IMO0002 Reserved for concepts with insufficient information to code with codable children: Secondary | ICD-10-CM | POA: Diagnosis not present

## 2013-05-27 ENCOUNTER — Encounter: Payer: Self-pay | Admitting: Vascular Surgery

## 2013-05-27 ENCOUNTER — Other Ambulatory Visit: Payer: Self-pay

## 2013-05-27 DIAGNOSIS — M79609 Pain in unspecified limb: Secondary | ICD-10-CM

## 2013-05-27 DIAGNOSIS — M2020 Hallux rigidus, unspecified foot: Secondary | ICD-10-CM | POA: Diagnosis not present

## 2013-05-27 DIAGNOSIS — M12279 Villonodular synovitis (pigmented), unspecified ankle and foot: Secondary | ICD-10-CM | POA: Diagnosis not present

## 2013-05-27 DIAGNOSIS — IMO0002 Reserved for concepts with insufficient information to code with codable children: Secondary | ICD-10-CM | POA: Diagnosis not present

## 2013-06-19 ENCOUNTER — Encounter: Payer: Self-pay | Admitting: Vascular Surgery

## 2013-06-23 ENCOUNTER — Ambulatory Visit (INDEPENDENT_AMBULATORY_CARE_PROVIDER_SITE_OTHER): Payer: Medicare Other | Admitting: Vascular Surgery

## 2013-06-23 ENCOUNTER — Encounter (INDEPENDENT_AMBULATORY_CARE_PROVIDER_SITE_OTHER): Payer: Medicare Other | Admitting: *Deleted

## 2013-06-23 ENCOUNTER — Encounter: Payer: Self-pay | Admitting: Vascular Surgery

## 2013-06-23 VITALS — BP 134/84 | HR 85 | Resp 16 | Ht 64.0 in | Wt 165.0 lb

## 2013-06-23 DIAGNOSIS — M79609 Pain in unspecified limb: Secondary | ICD-10-CM

## 2013-06-23 NOTE — Progress Notes (Signed)
Subjective:     Patient ID: Megan [name], female   DOB: 12-31-1932, 79 y.o.   MRN: [id number]  HPI this 79 year old female was referred by Dr. Leticia Penna at the [facility] for evaluation of bilateral foot pain. The patient states she's been having pain in both feet since the 1990s. It has become worse and occasionally has a burning discomfort. She has no history of diabetes mellitus or neuropathy to her knowledge. She has been blind for many years. She denies any history of nonhealing ulcers infection or gangrene of the feet. She ambulates long distances with help and denies any claudication symptoms. Most of her symptoms occur at night but they are also present to a lesser degree in the daytime. Tylenol) symptoms.  Past Medical History  Diagnosis Date  . Permanent atrial fibrillation   . Benign hypertensive heart disease without heart failure   . Blind   . Tachycardia-bradycardia syndrome   . GERD (gastroesophageal reflux disease)   . Dyslipidemia   . Current use of long term anticoagulation   . Hypertension     History  Substance Use Topics  . Smoking status: Never Smoker   . Smokeless tobacco: Never Used  . Alcohol Use: No    Family History  Problem Relation Age of Onset  . Hypertension    . Hypertension Mother   . Diabetes Mother   . Stroke Mother     No Known Allergies  Current outpatient prescriptions:acetaminophen (TYLENOL) 500 MG tablet, Take 500 mg by mouth every 6 (six) hours as needed., Disp: , Rfl: ;  cholecalciferol (VITAMIN D) 1000 UNITS tablet, Take 1,000 Units by mouth daily., Disp: , Rfl: ;  clotrimazole-betamethasone (LOTRISONE) cream, Apply topically as needed., Disp: , Rfl: ;  digoxin (LANOXIN) 0.25 MG tablet, Take 0.25 mg by mouth daily., Disp: , Rfl:  famotidine (PEPCID) 10 MG tablet, Take 10 mg by mouth daily as needed. For acid reflux, Disp: , Rfl: ;  fenofibrate 160 MG tablet, Take 160 mg by mouth daily., Disp: , Rfl: ;  hydrochlorothiazide (MICROZIDE) 12.5  MG capsule, Take 12.5 mg by mouth daily., Disp: , Rfl: ;  metoprolol (LOPRESSOR) 50 MG tablet, Take 50 mg by mouth 2 (two) times daily., Disp: , Rfl:  Multiple Vitamins-Minerals (ICAPS AREDS FORMULA PO), Take 2 capsules by mouth daily. , Disp: , Rfl: ;  Omega-3 Fatty Acids (FISH OIL) 1000 MG CAPS, Take 2,000 mg by mouth daily., Disp: , Rfl: ;  Polyethyl Glycol-Propyl Glycol (SYSTANE) 0.4-0.3 % SOLN, Apply to eye as needed., Disp: , Rfl: ;  verapamil (CALAN-SR) 240 MG CR tablet, Take 240 mg by mouth daily. , Disp: , Rfl:  vitamin B-12 (CYANOCOBALAMIN) 250 MCG tablet, Take 250 mcg by mouth every other day., Disp: , Rfl: ;  Vitamin D, Ergocalciferol, (DRISDOL) 50000 UNITS CAPS, Take 50,000 Units by mouth once a week. Does not take on any specific day per husband, Disp: , Rfl: ;  warfarin (COUMADIN) 2.5 MG tablet, Take 2.5-3.75 mg by mouth every evening. Take 2.5 mg Sun, Tues, Thurs and Sat, take 3.75 MG Monday, Wed and Friday, Disp: , Rfl:   BP 134/84  Pulse 85  Resp 16  Ht 5\' 4"  (1.626 m)  Wt 165 lb (74.844 kg)  BMI 28.31 kg/m2  Body mass index is 28.31 kg/(m^2).           Review of Systems patient has leg pain low lying supine, swelling in legs, spider veins, permanent blindness. Systems negative in a complete review  of systems    Objective:   Physical Exam blood pressure 134/84 heart rate 85 respirations 16 Gen.-alert and oriented x3 in no apparent distress HEENT-patient is blind  Lungs no rhonchi or wheezing Cardiovascular regular rhythm no murmurs carotid pulses 3+ palpable no bruits audible Abdomen soft nontender no palpable masses Musculoskeletal free of  major deformities Skin clear -no rashes Neurologic normal Lower extremities 3+ femoral and dorsalis pedis pulses palpable bilaterally with no edema-no evidence of ischemia, gangrene, or ulceration. No significant venous insufficiency noted.  Today I ordered lower extremity arterial Dopplers and waveforms which are reviewed  and interpreted. These are totally normal. There is triphasic flow bilaterally in the feet with excellent pressures.      Assessment:     Bilateral foot pain-chronic-no evidence of arterial insufficiency Do not know etiology-possibly this could be neuropathy    Plan:     No need for further vascular evaluation-return to see me on when necessary basis

## 2013-06-25 DIAGNOSIS — I4891 Unspecified atrial fibrillation: Secondary | ICD-10-CM | POA: Diagnosis not present

## 2013-06-25 DIAGNOSIS — I119 Hypertensive heart disease without heart failure: Secondary | ICD-10-CM | POA: Diagnosis not present

## 2013-06-25 DIAGNOSIS — Z7901 Long term (current) use of anticoagulants: Secondary | ICD-10-CM | POA: Diagnosis not present

## 2013-07-10 ENCOUNTER — Encounter: Payer: Self-pay | Admitting: Podiatry

## 2013-07-22 DIAGNOSIS — IMO0002 Reserved for concepts with insufficient information to code with codable children: Secondary | ICD-10-CM | POA: Diagnosis not present

## 2013-07-22 DIAGNOSIS — B351 Tinea unguium: Secondary | ICD-10-CM | POA: Diagnosis not present

## 2013-07-22 DIAGNOSIS — G609 Hereditary and idiopathic neuropathy, unspecified: Secondary | ICD-10-CM | POA: Diagnosis not present

## 2013-07-22 DIAGNOSIS — M79609 Pain in unspecified limb: Secondary | ICD-10-CM | POA: Diagnosis not present

## 2013-08-04 DIAGNOSIS — I4891 Unspecified atrial fibrillation: Secondary | ICD-10-CM | POA: Diagnosis not present

## 2013-08-04 DIAGNOSIS — Z7901 Long term (current) use of anticoagulants: Secondary | ICD-10-CM | POA: Diagnosis not present

## 2013-08-15 DIAGNOSIS — N949 Unspecified condition associated with female genital organs and menstrual cycle: Secondary | ICD-10-CM | POA: Diagnosis not present

## 2013-08-19 DIAGNOSIS — B351 Tinea unguium: Secondary | ICD-10-CM | POA: Diagnosis not present

## 2013-08-19 DIAGNOSIS — M79609 Pain in unspecified limb: Secondary | ICD-10-CM | POA: Diagnosis not present

## 2013-08-19 DIAGNOSIS — G5750 Tarsal tunnel syndrome, unspecified lower limb: Secondary | ICD-10-CM | POA: Diagnosis not present

## 2013-08-19 DIAGNOSIS — IMO0002 Reserved for concepts with insufficient information to code with codable children: Secondary | ICD-10-CM | POA: Diagnosis not present

## 2013-08-19 DIAGNOSIS — Z950 Presence of cardiac pacemaker: Secondary | ICD-10-CM | POA: Diagnosis not present

## 2013-08-19 DIAGNOSIS — I4891 Unspecified atrial fibrillation: Secondary | ICD-10-CM | POA: Diagnosis not present

## 2013-09-15 DIAGNOSIS — Z7901 Long term (current) use of anticoagulants: Secondary | ICD-10-CM | POA: Diagnosis not present

## 2013-09-15 DIAGNOSIS — I4891 Unspecified atrial fibrillation: Secondary | ICD-10-CM | POA: Diagnosis not present

## 2013-09-23 DIAGNOSIS — B351 Tinea unguium: Secondary | ICD-10-CM | POA: Diagnosis not present

## 2013-09-23 DIAGNOSIS — M79609 Pain in unspecified limb: Secondary | ICD-10-CM | POA: Diagnosis not present

## 2013-09-29 ENCOUNTER — Ambulatory Visit (INDEPENDENT_AMBULATORY_CARE_PROVIDER_SITE_OTHER): Payer: Medicare Other | Admitting: Pharmacist

## 2013-09-29 DIAGNOSIS — I4891 Unspecified atrial fibrillation: Secondary | ICD-10-CM | POA: Diagnosis not present

## 2013-09-29 LAB — POCT INR: INR: 2

## 2013-09-30 DIAGNOSIS — M79609 Pain in unspecified limb: Secondary | ICD-10-CM | POA: Diagnosis not present

## 2013-10-02 DIAGNOSIS — M79609 Pain in unspecified limb: Secondary | ICD-10-CM | POA: Diagnosis not present

## 2013-10-07 DIAGNOSIS — M79609 Pain in unspecified limb: Secondary | ICD-10-CM | POA: Diagnosis not present

## 2013-10-09 DIAGNOSIS — M79609 Pain in unspecified limb: Secondary | ICD-10-CM | POA: Diagnosis not present

## 2013-10-14 DIAGNOSIS — I119 Hypertensive heart disease without heart failure: Secondary | ICD-10-CM | POA: Diagnosis not present

## 2013-10-14 DIAGNOSIS — Z23 Encounter for immunization: Secondary | ICD-10-CM | POA: Diagnosis not present

## 2013-10-14 DIAGNOSIS — G609 Hereditary and idiopathic neuropathy, unspecified: Secondary | ICD-10-CM | POA: Diagnosis not present

## 2013-10-14 DIAGNOSIS — I4891 Unspecified atrial fibrillation: Secondary | ICD-10-CM | POA: Diagnosis not present

## 2013-10-14 DIAGNOSIS — E785 Hyperlipidemia, unspecified: Secondary | ICD-10-CM | POA: Diagnosis not present

## 2013-10-14 DIAGNOSIS — Z0000 Encounter for general adult medical examination without abnormal findings: Secondary | ICD-10-CM | POA: Diagnosis not present

## 2013-10-14 DIAGNOSIS — E559 Vitamin D deficiency, unspecified: Secondary | ICD-10-CM | POA: Diagnosis not present

## 2013-10-14 DIAGNOSIS — Z1331 Encounter for screening for depression: Secondary | ICD-10-CM | POA: Diagnosis not present

## 2013-10-14 DIAGNOSIS — M79609 Pain in unspecified limb: Secondary | ICD-10-CM | POA: Diagnosis not present

## 2013-10-16 DIAGNOSIS — M79609 Pain in unspecified limb: Secondary | ICD-10-CM | POA: Diagnosis not present

## 2013-10-21 DIAGNOSIS — M79609 Pain in unspecified limb: Secondary | ICD-10-CM | POA: Diagnosis not present

## 2013-10-23 DIAGNOSIS — M79609 Pain in unspecified limb: Secondary | ICD-10-CM | POA: Diagnosis not present

## 2013-10-27 ENCOUNTER — Ambulatory Visit (INDEPENDENT_AMBULATORY_CARE_PROVIDER_SITE_OTHER): Payer: Medicare Other | Admitting: Pharmacist

## 2013-10-27 DIAGNOSIS — I4891 Unspecified atrial fibrillation: Secondary | ICD-10-CM | POA: Diagnosis not present

## 2013-10-27 LAB — POCT INR: INR: 1.6

## 2013-10-28 DIAGNOSIS — M79609 Pain in unspecified limb: Secondary | ICD-10-CM | POA: Diagnosis not present

## 2013-10-30 DIAGNOSIS — M79609 Pain in unspecified limb: Secondary | ICD-10-CM | POA: Diagnosis not present

## 2013-11-06 DIAGNOSIS — M79609 Pain in unspecified limb: Secondary | ICD-10-CM | POA: Diagnosis not present

## 2013-11-10 ENCOUNTER — Ambulatory Visit (INDEPENDENT_AMBULATORY_CARE_PROVIDER_SITE_OTHER): Payer: Medicare Other | Admitting: Pharmacist

## 2013-11-10 DIAGNOSIS — I4891 Unspecified atrial fibrillation: Secondary | ICD-10-CM

## 2013-11-10 LAB — POCT INR: INR: 2.4

## 2013-11-24 ENCOUNTER — Other Ambulatory Visit: Payer: Self-pay

## 2013-11-24 DIAGNOSIS — Z9889 Other specified postprocedural states: Secondary | ICD-10-CM

## 2013-11-24 DIAGNOSIS — Z1231 Encounter for screening mammogram for malignant neoplasm of breast: Secondary | ICD-10-CM

## 2013-11-24 DIAGNOSIS — IMO0002 Reserved for concepts with insufficient information to code with codable children: Secondary | ICD-10-CM | POA: Diagnosis not present

## 2013-12-01 DIAGNOSIS — L600 Ingrowing nail: Secondary | ICD-10-CM | POA: Diagnosis not present

## 2013-12-08 ENCOUNTER — Ambulatory Visit (INDEPENDENT_AMBULATORY_CARE_PROVIDER_SITE_OTHER): Payer: Medicare Other | Admitting: Pharmacist

## 2013-12-08 DIAGNOSIS — I4891 Unspecified atrial fibrillation: Secondary | ICD-10-CM

## 2013-12-08 DIAGNOSIS — M12279 Villonodular synovitis (pigmented), unspecified ankle and foot: Secondary | ICD-10-CM | POA: Diagnosis not present

## 2013-12-24 DIAGNOSIS — L600 Ingrowing nail: Secondary | ICD-10-CM | POA: Diagnosis not present

## 2013-12-25 ENCOUNTER — Ambulatory Visit (INDEPENDENT_AMBULATORY_CARE_PROVIDER_SITE_OTHER): Payer: Medicare Other | Admitting: Cardiology

## 2013-12-25 ENCOUNTER — Encounter: Payer: Self-pay | Admitting: Cardiology

## 2013-12-25 VITALS — BP 143/72 | HR 72 | Ht 64.0 in | Wt 170.8 lb

## 2013-12-25 DIAGNOSIS — K5790 Diverticulosis of intestine, part unspecified, without perforation or abscess without bleeding: Secondary | ICD-10-CM | POA: Insufficient documentation

## 2013-12-25 DIAGNOSIS — I4821 Permanent atrial fibrillation: Secondary | ICD-10-CM

## 2013-12-25 DIAGNOSIS — I4891 Unspecified atrial fibrillation: Secondary | ICD-10-CM | POA: Diagnosis not present

## 2013-12-25 DIAGNOSIS — Z950 Presence of cardiac pacemaker: Secondary | ICD-10-CM | POA: Diagnosis not present

## 2013-12-25 DIAGNOSIS — I10 Essential (primary) hypertension: Secondary | ICD-10-CM | POA: Diagnosis not present

## 2013-12-25 DIAGNOSIS — I495 Sick sinus syndrome: Secondary | ICD-10-CM | POA: Diagnosis not present

## 2013-12-25 NOTE — Patient Instructions (Signed)
Your physician recommends that you continue on your current medications as directed. Please refer to the Current Medication list given to you today.  Your physician recommends that you go to the lab today for a Digoxin Level  Your physician wants you to follow-up in: 6 months with Dr Mallie Snooks will receive a reminder letter in the mail two months in advance. If you don't receive a letter, please call our office to schedule the follow-up appointment.

## 2013-12-25 NOTE — Progress Notes (Signed)
[place], [name],   [address] Phone: [phone] Fax:  [phone]  Date:  12/25/2013   ID:  Megan [name], DOB 20-Oct-1933, MRN [id number]  PCP:  Reginia Naas, MD  Cardiologist:  Fransico Him, MD     History of Present Illness: TRUE Megan [name] is a 80 y.o. female with a history of chronic atrial fibrillation, chronic systemic anticoagulation, tachybrady syndrome s/p PPM and HTN who presents today for followup.  She is doing well.  She denies any chest pain, SOB, DOE, dizziness, palpitations or syncope.  She occasionally has some mild LE edema.     Wt Readings from Last 3 Encounters:  12/25/13 170 lb 12.8 oz (77.474 kg)  06/23/13 165 lb (74.844 kg)  09/22/12 160 lb 4.4 oz (72.7 kg)     Past Medical History  Diagnosis Date  . Permanent atrial fibrillation   . Benign hypertensive heart disease without heart failure   . Blind   . Tachycardia-bradycardia syndrome s/p PPM   . GERD (gastroesophageal reflux disease)   . Dyslipidemia   . Current use of long term anticoagulation   . Hypertension   . Diverticulosis   . Vitamin D deficiency disease     Current Outpatient Prescriptions  Medication Sig Dispense Refill  . acetaminophen (TYLENOL) 500 MG tablet Take 500 mg by mouth every 6 (six) hours as needed.      . cholecalciferol (VITAMIN D) 1000 UNITS tablet Take 1,000 Units by mouth daily.      . clotrimazole-betamethasone (LOTRISONE) cream Apply topically as needed.      . digoxin (LANOXIN) 0.25 MG tablet Take 0.25 mg by mouth daily.      . fenofibrate 160 MG tablet Take 160 mg by mouth daily.      . hydrochlorothiazide (MICROZIDE) 12.5 MG capsule Take 12.5 mg by mouth daily.      . metoprolol (LOPRESSOR) 50 MG tablet Take 50 mg by mouth 2 (two) times daily.      . Multiple Vitamins-Minerals (ICAPS AREDS FORMULA PO) Take 2 capsules by mouth daily.       . Omega-3 Fatty Acids (FISH OIL) 1000 MG CAPS Take 2,000 mg by mouth daily.      Vladimir Faster  Glycol-Propyl Glycol (SYSTANE) 0.4-0.3 % SOLN Apply to eye as needed.      . verapamil (CALAN-SR) 240 MG CR tablet Take 240 mg by mouth daily.       . vitamin B-12 (CYANOCOBALAMIN) 250 MCG tablet Take 250 mcg by mouth every other day.      . warfarin (COUMADIN) 2.5 MG tablet Take 2.5-3.75 mg by mouth every evening. Take 2.5 mg Sun, Tues, Thurs and Sat, take 3.75 MG Monday, Wed and Friday       No current facility-administered medications for this visit.    Allergies:    Allergies  Allergen Reactions  . Adhesive [Tape]     Social History:  The patient  reports that she has quit smoking. She has never used smokeless tobacco. She reports that she drinks alcohol. She reports that she does not use illicit drugs.   Family History:  The patient's family history includes Diabetes in her mother; Hypertension in her mother and another family member; Stroke in her mother.   ROS:  Please see the history of present illness.      All other systems reviewed and negative.   PHYSICAL EXAM: VS:  BP 143/72  Pulse 72  Ht 5\' 4"  (1.626 m)  Wt  170 lb 12.8 oz (77.474 kg)  BMI 29.30 kg/m2 Well nourished, well developed, in no acute distress HEENT: normal Neck: no JVD Cardiac:  normal S1, S2; RRR; no murmur Lungs:  clear to auscultation bilaterally, no wheezing, rhonchi or rales Abd: soft, nontender, no hepatomegaly Ext: no edema Skin: warm and dry Neuro:  CNs 2-12 intact, no focal abnormalities noted       ASSESSMENT AND PLAN:  1. Chronic atrial fibrillation rate controlled  - continue Verapamil/digoxin/Warfarin/metoprolol  - check dig level 2. Chronic systemic anticoagulation 3. HTN - well controlled  - Continue HCTZ/Verapamil/metoprolol 4. Tachybrady syndrome s/p PPM   Followup with me in 6 months  Signed, Fransico Him, MD 12/25/2013 9:06 AM

## 2013-12-26 ENCOUNTER — Ambulatory Visit
Admission: RE | Admit: 2013-12-26 | Discharge: 2013-12-26 | Disposition: A | Discharge status: Home / Self Care | Payer: Medicare Other | Source: Ambulatory Visit

## 2013-12-26 ENCOUNTER — Other Ambulatory Visit: Payer: Self-pay | Admitting: General Surgery

## 2013-12-26 DIAGNOSIS — Z1231 Encounter for screening mammogram for malignant neoplasm of breast: Secondary | ICD-10-CM

## 2013-12-26 DIAGNOSIS — I4891 Unspecified atrial fibrillation: Secondary | ICD-10-CM

## 2013-12-26 DIAGNOSIS — Z9889 Other specified postprocedural states: Secondary | ICD-10-CM

## 2013-12-26 LAB — DIGOXIN LEVEL: DIGOXIN LVL: 2.4 ng/mL — AB (ref 0.8–2.0)

## 2013-12-30 ENCOUNTER — Other Ambulatory Visit: Payer: Medicare Other

## 2013-12-30 DIAGNOSIS — I4891 Unspecified atrial fibrillation: Secondary | ICD-10-CM | POA: Diagnosis not present

## 2013-12-31 DIAGNOSIS — M12279 Villonodular synovitis (pigmented), unspecified ankle and foot: Secondary | ICD-10-CM | POA: Diagnosis not present

## 2013-12-31 LAB — DIGOXIN LEVEL: Digoxin Level: 0.44 ng/mL — ABNORMAL LOW (ref 0.8–2.0)

## 2014-01-01 ENCOUNTER — Telehealth: Payer: Self-pay | Admitting: General Surgery

## 2014-01-01 DIAGNOSIS — Z79899 Other long term (current) drug therapy: Secondary | ICD-10-CM

## 2014-01-01 MED ORDER — DIGOXIN 125 MCG PO TABS: 0.1250 mg | ORAL_TABLET | Freq: Every day | ORAL | Status: DC

## 2014-01-01 NOTE — Telephone Encounter (Signed)
Message copied by Lily Kocher on Thu Jan 01, 2014  8:53 AM ------      Message from: Fransico Him R      Created: Wed Dec 31, 2013  9:32 AM       Dig level better after holding for a few days.  Please restart digoxin at lower dose of 0.125mg  daily and check dig level again in 1 week ------

## 2014-01-07 ENCOUNTER — Other Ambulatory Visit: Payer: Self-pay | Admitting: General Surgery

## 2014-01-07 ENCOUNTER — Encounter: Payer: Medicare Other | Admitting: Internal Medicine

## 2014-01-07 ENCOUNTER — Other Ambulatory Visit: Payer: Medicare Other

## 2014-01-07 DIAGNOSIS — Z79899 Other long term (current) drug therapy: Secondary | ICD-10-CM

## 2014-01-07 MED ORDER — DIGOXIN 125 MCG PO TABS: 0.1250 mg | ORAL_TABLET | Freq: Every day | ORAL | Status: DC

## 2014-01-08 ENCOUNTER — Telehealth: Payer: Self-pay | Admitting: Cardiology

## 2014-01-08 LAB — DIGOXIN LEVEL: Digoxin Level: 1.3 ng/mL (ref 0.8–2.0)

## 2014-01-08 NOTE — Telephone Encounter (Signed)
Spoke with pt and gave lab results

## 2014-01-08 NOTE — Telephone Encounter (Signed)
Follow Up ° ° ° °Returning call from earlier. Please call. °

## 2014-01-08 NOTE — Progress Notes (Signed)
Pt is aware.  

## 2014-01-09 ENCOUNTER — Encounter: Payer: Medicare Other | Admitting: Internal Medicine

## 2014-01-14 ENCOUNTER — Encounter: Payer: Medicare Other | Admitting: Internal Medicine

## 2014-01-21 DIAGNOSIS — T8189XA Other complications of procedures, not elsewhere classified, initial encounter: Secondary | ICD-10-CM | POA: Diagnosis not present

## 2014-01-22 ENCOUNTER — Ambulatory Visit (INDEPENDENT_AMBULATORY_CARE_PROVIDER_SITE_OTHER): Payer: Medicare Other | Admitting: Internal Medicine

## 2014-01-22 ENCOUNTER — Ambulatory Visit (INDEPENDENT_AMBULATORY_CARE_PROVIDER_SITE_OTHER): Payer: Medicare Other | Admitting: *Deleted

## 2014-01-22 ENCOUNTER — Encounter: Payer: Self-pay | Admitting: Internal Medicine

## 2014-01-22 VITALS — BP 155/90 | HR 67 | Ht 64.0 in

## 2014-01-22 DIAGNOSIS — I10 Essential (primary) hypertension: Secondary | ICD-10-CM | POA: Diagnosis not present

## 2014-01-22 DIAGNOSIS — Z5181 Encounter for therapeutic drug level monitoring: Secondary | ICD-10-CM

## 2014-01-22 DIAGNOSIS — I4891 Unspecified atrial fibrillation: Secondary | ICD-10-CM

## 2014-01-22 DIAGNOSIS — Z950 Presence of cardiac pacemaker: Secondary | ICD-10-CM

## 2014-01-22 DIAGNOSIS — I495 Sick sinus syndrome: Secondary | ICD-10-CM | POA: Diagnosis not present

## 2014-01-22 DIAGNOSIS — I4821 Permanent atrial fibrillation: Secondary | ICD-10-CM

## 2014-01-22 LAB — MDC_IDC_ENUM_SESS_TYPE_INCLINIC
Battery Voltage: 2.98 V
Date Time Interrogation Session: 20150205092429
Implantable Pulse Generator Serial Number: 7298578
Lead Channel Impedance Value: 425 Ohm
Lead Channel Pacing Threshold Amplitude: 1.25 V
Lead Channel Pacing Threshold Amplitude: 1.25 V
Lead Channel Pacing Threshold Pulse Width: 0.4 ms
Lead Channel Pacing Threshold Pulse Width: 0.4 ms
Lead Channel Sensing Intrinsic Amplitude: 9 mV
Lead Channel Setting Sensing Sensitivity: 2 mV
MDC IDC MSMT BATTERY REMAINING LONGEVITY: 128.4 mo
MDC IDC SET LEADCHNL RV PACING AMPLITUDE: 2.5 V
MDC IDC SET LEADCHNL RV PACING PULSEWIDTH: 0.4 ms
MDC IDC STAT BRADY RV PERCENT PACED: 45 %

## 2014-01-22 LAB — POCT INR: INR: 2.1

## 2014-01-22 NOTE — Assessment & Plan Note (Signed)
Atrial fibrillation is permanent. Rate control is good. LV function is normal, this we will stop the digoxin.

## 2014-01-22 NOTE — Progress Notes (Signed)
Patient Care Team: Candace Wyline Copas, MD as PCP - General (Family Medicine) Jana Half, DPM as Consulting Physician (Podiatry)   HPI  Megan [name] is a 80 y.o. female Seen in followup for perm afib with bradycardia s/p pacer implantation 10/13-[facility]  The patient denies chest pain, shortness of breath, nocturnal dyspnea, orthopnea or peripheral edema.  There have been no palpitations, lightheadedness or syncope.   Echocardiogram 10/13 demonstrated normal left ventricular function  Past Medical History  Diagnosis Date  . Pacemaker [facility]     10/13  . Blind   . GERD (gastroesophageal reflux disease)   . Dyslipidemia   . Current use of long term anticoagulation   . Hypertension   . Diverticulosis   . Vitamin D deficiency disease   . Permanent atrial fibrillation   . Tachycardia-bradycardia syndrome     s/p PPM  . Encounter for long-term (current) use of other medications   . Peripheral neuropathy     Past Surgical History  Procedure Laterality Date  . Cholecystectomy  2004  . Abdominal hysterectomy  1978  . Partial colectomy  2011    For diverticulosis  . Breast reduction surgery  1990  . Insert / replace / remove pacemaker  09/2012    Single chamber PM - Dr. Radford Pax    Current Outpatient Prescriptions  Medication Sig Dispense Refill  . acetaminophen (TYLENOL) 500 MG tablet Take 500 mg by mouth every 6 (six) hours as needed.      . cholecalciferol (VITAMIN D) 1000 UNITS tablet Take 1,000 Units by mouth daily.      . clotrimazole-betamethasone (LOTRISONE) cream Apply topically as needed.      . fenofibrate 160 MG tablet Take 160 mg by mouth daily.      . hydrochlorothiazide (MICROZIDE) 12.5 MG capsule Take 12.5 mg by mouth daily.      . metoprolol (LOPRESSOR) 50 MG tablet Take 50 mg by mouth 2 (two) times daily.      . Multiple Vitamins-Minerals (ICAPS AREDS FORMULA PO) Take 2 capsules by mouth daily.       . Omega-3 Fatty Acids (FISH OIL) 1000  MG CAPS Take 2,000 mg by mouth daily.      Vladimir Faster Glycol-Propyl Glycol (SYSTANE) 0.4-0.3 % SOLN Apply to eye as needed.      . verapamil (CALAN-SR) 240 MG CR tablet Take 240 mg by mouth daily.       . vitamin B-12 (CYANOCOBALAMIN) 250 MCG tablet Take 250 mcg by mouth every other day.      . warfarin (COUMADIN) 2.5 MG tablet Take 2.5-3.75 mg by mouth every evening. Take 3.75  mg Sun, Tues, Wed and Fri,  take 2.5 MG  Monday, Thurs and Sat       No current facility-administered medications for this visit.    Allergies  Allergen Reactions  . Adhesive [Tape] Itching    Review of Systems negative except from HPI and PMH  Physical Exam BP 155/90  Pulse 67  Ht 5\' 4"  (1.626 m) Well developed and well nourished in no acute distress HENT normal E scleral and icterus clear Neck Supple JVP flat; carotids brisk and full Clear to ausculation Device pocket well healed; without hematoma or erythema.  There is no tethering Irregularlr irregular rate and rhythm, no murmurs gallops or rub Soft with active bowel sounds No clubbing cyanosis none Edema Alert and oriented, grossly normal motor and sensory function Skin Warm and Dry  Assessment and  Plan

## 2014-01-22 NOTE — Assessment & Plan Note (Signed)
45% ventricular pacing

## 2014-01-22 NOTE — Assessment & Plan Note (Signed)
The patient's device was interrogated.  The information was reviewed. No changes were made in the programming.    

## 2014-01-22 NOTE — Patient Instructions (Addendum)
Your physician has recommended you make the following change in your medication:  1) Stop Digoxin  Your physician wants you to follow-up in: 6 months with device clinic.  You will receive a reminder letter in the mail two months in advance. If you don't receive a letter, please call our office to schedule the follow-up appointment.  Your physician wants you to follow-up in: 1 year with Dr. Caryl Comes.  You will receive a reminder letter in the mail two months in advance. If you don't receive a letter, please call our office to schedule the follow-up appointment.

## 2014-01-22 NOTE — Assessment & Plan Note (Signed)
Blood pressure is elevated today; however, she has not checked her medicines. She says that her PCP follow her laboratories and so we will presume

## 2014-01-27 ENCOUNTER — Encounter: Payer: Self-pay | Admitting: Internal Medicine

## 2014-02-06 ENCOUNTER — Ambulatory Visit (INDEPENDENT_AMBULATORY_CARE_PROVIDER_SITE_OTHER): Payer: Medicare Other | Admitting: *Deleted

## 2014-02-06 DIAGNOSIS — I10 Essential (primary) hypertension: Secondary | ICD-10-CM

## 2014-02-06 DIAGNOSIS — I495 Sick sinus syndrome: Secondary | ICD-10-CM

## 2014-02-06 DIAGNOSIS — I4891 Unspecified atrial fibrillation: Secondary | ICD-10-CM | POA: Diagnosis not present

## 2014-02-06 DIAGNOSIS — I4821 Permanent atrial fibrillation: Secondary | ICD-10-CM

## 2014-02-06 LAB — LIPID PANEL
Cholesterol: 173 mg/dL (ref 0–200)
HDL: 49.9 mg/dL (ref >39.00)
LDL Cholesterol: 95 mg/dL (ref 0–99)
Total CHOL/HDL Ratio: 3
Triglycerides: 142 mg/dL (ref 0.0–149.0)
VLDL: 28.4 mg/dL (ref 0.0–40.0)

## 2014-02-06 LAB — HEPATIC FUNCTION PANEL
ALT: 31 U/L (ref 0–35)
AST: 21 U/L (ref 0–37)
Albumin: 3.7 g/dL (ref 3.5–5.2)
Alkaline Phosphatase: 47 U/L (ref 39–117)
BILIRUBIN DIRECT: 0 mg/dL (ref 0.0–0.3)
BILIRUBIN TOTAL: 0.5 mg/dL (ref 0.3–1.2)
Total Protein: 6.8 g/dL (ref 6.0–8.3)

## 2014-02-19 DIAGNOSIS — I119 Hypertensive heart disease without heart failure: Secondary | ICD-10-CM | POA: Diagnosis not present

## 2014-02-19 DIAGNOSIS — R42 Dizziness and giddiness: Secondary | ICD-10-CM | POA: Diagnosis not present

## 2014-02-19 DIAGNOSIS — I4891 Unspecified atrial fibrillation: Secondary | ICD-10-CM | POA: Diagnosis not present

## 2014-03-05 ENCOUNTER — Ambulatory Visit (INDEPENDENT_AMBULATORY_CARE_PROVIDER_SITE_OTHER): Payer: Medicare Other | Admitting: *Deleted

## 2014-03-05 DIAGNOSIS — Z5181 Encounter for therapeutic drug level monitoring: Secondary | ICD-10-CM | POA: Diagnosis not present

## 2014-03-05 DIAGNOSIS — I4891 Unspecified atrial fibrillation: Secondary | ICD-10-CM | POA: Diagnosis not present

## 2014-03-05 DIAGNOSIS — I4821 Permanent atrial fibrillation: Secondary | ICD-10-CM

## 2014-03-05 LAB — POCT INR: INR: 2

## 2014-03-24 DIAGNOSIS — Z01419 Encounter for gynecological examination (general) (routine) without abnormal findings: Secondary | ICD-10-CM | POA: Diagnosis not present

## 2014-04-02 ENCOUNTER — Ambulatory Visit (INDEPENDENT_AMBULATORY_CARE_PROVIDER_SITE_OTHER): Payer: Medicare Other | Admitting: Pharmacist

## 2014-04-02 DIAGNOSIS — I4821 Permanent atrial fibrillation: Secondary | ICD-10-CM

## 2014-04-02 DIAGNOSIS — I4891 Unspecified atrial fibrillation: Secondary | ICD-10-CM

## 2014-04-02 DIAGNOSIS — Z5181 Encounter for therapeutic drug level monitoring: Secondary | ICD-10-CM | POA: Diagnosis not present

## 2014-04-02 LAB — POCT INR: INR: 2.6

## 2014-05-05 DIAGNOSIS — I10 Essential (primary) hypertension: Secondary | ICD-10-CM | POA: Diagnosis not present

## 2014-05-05 DIAGNOSIS — I4891 Unspecified atrial fibrillation: Secondary | ICD-10-CM | POA: Diagnosis not present

## 2014-05-05 DIAGNOSIS — E7800 Pure hypercholesterolemia, unspecified: Secondary | ICD-10-CM | POA: Diagnosis not present

## 2014-05-14 ENCOUNTER — Ambulatory Visit (INDEPENDENT_AMBULATORY_CARE_PROVIDER_SITE_OTHER): Payer: Medicare Other | Admitting: *Deleted

## 2014-05-14 DIAGNOSIS — Z5181 Encounter for therapeutic drug level monitoring: Secondary | ICD-10-CM | POA: Diagnosis not present

## 2014-05-14 DIAGNOSIS — I4891 Unspecified atrial fibrillation: Secondary | ICD-10-CM | POA: Diagnosis not present

## 2014-05-14 DIAGNOSIS — I4821 Permanent atrial fibrillation: Secondary | ICD-10-CM

## 2014-05-14 LAB — POCT INR: INR: 2.4

## 2014-05-18 DIAGNOSIS — H2640 Unspecified secondary cataract: Secondary | ICD-10-CM | POA: Diagnosis not present

## 2014-05-18 DIAGNOSIS — Z961 Presence of intraocular lens: Secondary | ICD-10-CM | POA: Diagnosis not present

## 2014-05-18 DIAGNOSIS — H3550 Unspecified hereditary retinal dystrophy: Secondary | ICD-10-CM | POA: Diagnosis not present

## 2014-05-27 DIAGNOSIS — H2640 Unspecified secondary cataract: Secondary | ICD-10-CM | POA: Diagnosis not present

## 2014-05-27 DIAGNOSIS — H26499 Other secondary cataract, unspecified eye: Secondary | ICD-10-CM | POA: Diagnosis not present

## 2014-06-02 DIAGNOSIS — H35389 Toxic maculopathy, unspecified eye: Secondary | ICD-10-CM | POA: Diagnosis not present

## 2014-06-02 DIAGNOSIS — Z961 Presence of intraocular lens: Secondary | ICD-10-CM | POA: Diagnosis not present

## 2014-06-08 DIAGNOSIS — B351 Tinea unguium: Secondary | ICD-10-CM | POA: Diagnosis not present

## 2014-06-08 DIAGNOSIS — M79609 Pain in unspecified limb: Secondary | ICD-10-CM | POA: Diagnosis not present

## 2014-06-25 ENCOUNTER — Ambulatory Visit (INDEPENDENT_AMBULATORY_CARE_PROVIDER_SITE_OTHER): Payer: Medicare Other | Admitting: Pharmacist

## 2014-06-25 DIAGNOSIS — I4891 Unspecified atrial fibrillation: Secondary | ICD-10-CM | POA: Diagnosis not present

## 2014-06-25 DIAGNOSIS — Z5181 Encounter for therapeutic drug level monitoring: Secondary | ICD-10-CM

## 2014-06-25 DIAGNOSIS — I4821 Permanent atrial fibrillation: Secondary | ICD-10-CM

## 2014-06-25 LAB — POCT INR: INR: 1.9

## 2014-06-27 ENCOUNTER — Encounter (HOSPITAL_COMMUNITY): Payer: Self-pay | Admitting: Emergency Medicine

## 2014-06-27 ENCOUNTER — Emergency Department (HOSPITAL_COMMUNITY): Payer: Medicare Other

## 2014-06-27 ENCOUNTER — Emergency Department (HOSPITAL_COMMUNITY)
Admission: EM | Admit: 2014-06-27 | Discharge: 2014-06-27 | Disposition: A | Discharge status: Home / Self Care | Payer: Medicare Other | Attending: Emergency Medicine | Admitting: Emergency Medicine

## 2014-06-27 DIAGNOSIS — Z79899 Other long term (current) drug therapy: Secondary | ICD-10-CM | POA: Insufficient documentation

## 2014-06-27 DIAGNOSIS — Z87891 Personal history of nicotine dependence: Secondary | ICD-10-CM | POA: Diagnosis not present

## 2014-06-27 DIAGNOSIS — Z950 Presence of cardiac pacemaker: Secondary | ICD-10-CM | POA: Insufficient documentation

## 2014-06-27 DIAGNOSIS — H543 Unqualified visual loss, both eyes: Secondary | ICD-10-CM | POA: Insufficient documentation

## 2014-06-27 DIAGNOSIS — E559 Vitamin D deficiency, unspecified: Secondary | ICD-10-CM | POA: Diagnosis not present

## 2014-06-27 DIAGNOSIS — Z7901 Long term (current) use of anticoagulants: Secondary | ICD-10-CM | POA: Diagnosis not present

## 2014-06-27 DIAGNOSIS — E785 Hyperlipidemia, unspecified: Secondary | ICD-10-CM | POA: Diagnosis not present

## 2014-06-27 DIAGNOSIS — I10 Essential (primary) hypertension: Secondary | ICD-10-CM | POA: Insufficient documentation

## 2014-06-27 DIAGNOSIS — I495 Sick sinus syndrome: Secondary | ICD-10-CM | POA: Diagnosis not present

## 2014-06-27 DIAGNOSIS — Z8719 Personal history of other diseases of the digestive system: Secondary | ICD-10-CM | POA: Insufficient documentation

## 2014-06-27 DIAGNOSIS — R42 Dizziness and giddiness: Secondary | ICD-10-CM | POA: Insufficient documentation

## 2014-06-27 DIAGNOSIS — I4891 Unspecified atrial fibrillation: Secondary | ICD-10-CM | POA: Diagnosis not present

## 2014-06-27 DIAGNOSIS — R51 Headache: Secondary | ICD-10-CM | POA: Diagnosis not present

## 2014-06-27 DIAGNOSIS — H539 Unspecified visual disturbance: Secondary | ICD-10-CM | POA: Diagnosis not present

## 2014-06-27 LAB — CBC
HEMATOCRIT: 43.8 % (ref 36.0–46.0)
Hemoglobin: 15 g/dL (ref 12.0–15.0)
MCH: 32.2 pg (ref 26.0–34.0)
MCHC: 34.2 g/dL (ref 30.0–36.0)
MCV: 94 fL (ref 78.0–100.0)
PLATELETS: 236 10*3/uL (ref 150–400)
RBC: 4.66 MIL/uL (ref 3.87–5.11)
RDW: 13.3 % (ref 11.5–15.5)
WBC: 6.4 10*3/uL (ref 4.0–10.5)

## 2014-06-27 LAB — BASIC METABOLIC PANEL
ANION GAP: 13 (ref 5–15)
BUN: 12 mg/dL (ref 6–23)
CHLORIDE: 102 meq/L (ref 96–112)
CO2: 26 meq/L (ref 19–32)
Calcium: 10.5 mg/dL (ref 8.4–10.5)
Creatinine, Ser: 0.71 mg/dL (ref 0.50–1.10)
GFR calc Af Amer: >90 mL/min (ref >90)
GFR calc non Af Amer: 80 mL/min — ABNORMAL LOW (ref >90)
Glucose, Bld: 140 mg/dL — ABNORMAL HIGH (ref 70–99)
Potassium: 4.5 mEq/L (ref 3.7–5.3)
Sodium: 141 mEq/L (ref 137–147)

## 2014-06-27 LAB — URINALYSIS, ROUTINE W REFLEX MICROSCOPIC
BILIRUBIN URINE: NEGATIVE
GLUCOSE, UA: NEGATIVE mg/dL
HGB URINE DIPSTICK: NEGATIVE
Ketones, ur: NEGATIVE mg/dL
Nitrite: NEGATIVE
Protein, ur: NEGATIVE mg/dL
SPECIFIC GRAVITY, URINE: 1.015 (ref 1.005–1.030)
Urobilinogen, UA: 0.2 mg/dL (ref 0.0–1.0)
pH: 6 (ref 5.0–8.0)

## 2014-06-27 LAB — URINE MICROSCOPIC-ADD ON

## 2014-06-27 LAB — PROTIME-INR
INR: 2.32 — AB (ref 0.00–1.49)
Prothrombin Time: 25.5 seconds — ABNORMAL HIGH (ref 11.6–15.2)

## 2014-06-27 NOTE — ED Notes (Signed)
Pt has returned from radiology.  

## 2014-06-27 NOTE — ED Notes (Signed)
Pt reports that she had a headache this AM, but went away after taking some tylenol. Pt denies any other symptoms.

## 2014-06-27 NOTE — ED Notes (Signed)
Pt ambulatory to bathroom with steady gait.

## 2014-06-27 NOTE — ED Provider Notes (Signed)
CSN: [id number]     Arrival date & time 06/27/14  51 History   First MD Initiated Contact with Patient 06/27/14 1922     Chief Complaint  Patient presents with  . Hypertension     (Consider location/radiation/quality/duration/timing/severity/associated sxs/prior Treatment) HPI  Megan [name] This 80 year old female with a past medical history of blindness (able to see forms, movement, and light), reflux, hyperlipidemia, hypertension, atrial fibrillation, Coumadin use, status post pacemaker. The patient presents to the emergency department with chief complaint of visual disturbance and disequilibrium. Patient said that last night her husband awoke her from sleep around 9 PM when she follows up on her couch. She complains that when she woke she objects in the room moving her field of vision. Her blood pressure and states it was elevated to 148/108. This is very high for her and she is compliant with medications She states this lasted approximately one hour before she went to sleep. Patient awoke around 4 AM to use the bathroom. At that time she continued to have the visual disturbances. She also felt disoriented and had some disequilibrium. Her husband said that her gait was abnormal and he had to help her to the bathroom. This lasted approximately 1/2 hour. She's never had any symptoms like this before. Patient will on her INR here in the emergency department. She is unable to tell me if she had any changes in her stools. She has no symptoms at this time.  Past Medical History  Diagnosis Date  . Pacemaker [facility]     10/13  . Blind   . GERD (gastroesophageal reflux disease)   . Dyslipidemia   . Current use of long term anticoagulation   . Hypertension   . Diverticulosis   . Vitamin D deficiency disease   . Permanent atrial fibrillation   . Tachycardia-bradycardia syndrome     s/p PPM  . Encounter for long-term (current) use of other medications   . Peripheral neuropathy    Past  Surgical History  Procedure Laterality Date  . Cholecystectomy  2004  . Abdominal hysterectomy  1978  . Partial colectomy  2011    For diverticulosis  . Breast reduction surgery  1990  . Insert / replace / remove pacemaker  09/2012    Single chamber PM - Dr. Radford Pax   Family History  Problem Relation Age of Onset  . Hypertension    . Diabetes      3 half sisters with DM, one deseased due to DM complications  . Hypertension Mother   . Diabetes Mother   . Stroke Mother   . Diabetes Sister    History  Substance Use Topics  . Smoking status: Former Smoker -- 3 years    Types: Cigarettes    Quit date: 12/18/1966  . Smokeless tobacco: Never Used  . Alcohol Use: Yes     Comment: occassional glass of wine   OB History   Grav Para Term Preterm Abortions TAB SAB Ect Mult Living                 Review of Systems Ten systems reviewed and are negative for acute change, except as noted in the HPI.     Allergies  Adhesive  Home Medications   Prior to Admission medications   Medication Sig Start Date End Date Taking? Authorizing Provider  acetaminophen (TYLENOL) 500 MG tablet Take 500 mg by mouth every 6 (six) hours as needed.    Historical Provider, MD  cholecalciferol (VITAMIN  D) 1000 UNITS tablet Take 1,000 Units by mouth daily.    Historical Provider, MD  clotrimazole-betamethasone (LOTRISONE) cream Apply topically as needed.    Historical Provider, MD  fenofibrate 160 MG tablet Take 160 mg by mouth daily.    Historical Provider, MD  hydrochlorothiazide (MICROZIDE) 12.5 MG capsule Take 12.5 mg by mouth daily.    Historical Provider, MD  metoprolol (LOPRESSOR) 50 MG tablet Take 50 mg by mouth 2 (two) times daily.    Historical Provider, MD  Multiple Vitamins-Minerals (ICAPS AREDS FORMULA PO) Take 2 capsules by mouth daily.     Historical Provider, MD  Omega-3 Fatty Acids (FISH OIL) 1000 MG CAPS Take 2,000 mg by mouth daily.    Historical Provider, MD  Polyethyl Glycol-Propyl  Glycol (SYSTANE) 0.4-0.3 % SOLN Apply to eye as needed.    Historical Provider, MD  verapamil (CALAN-SR) 240 MG CR tablet Take 240 mg by mouth daily.     Historical Provider, MD  vitamin B-12 (CYANOCOBALAMIN) 250 MCG tablet Take 250 mcg by mouth every other day.    Historical Provider, MD  warfarin (COUMADIN) 2.5 MG tablet Take 2.5-3.75 mg by mouth every evening. Take 3.75  mg Sun, Tues, Wed and Fri,  take 2.5 MG  Monday, Thurs and Sat    Historical Provider, MD   BP 149/83  Pulse 55  Temp(Src) 97.6 F (36.4 C) (Oral)  Resp 16  Ht 5\' 4"  (1.626 m)  Wt 160 lb (72.576 kg)  BMI 27.45 kg/m2  SpO2 96% Physical Exam  Constitutional: She is oriented to person, place, and time. She appears well-developed and well-nourished. No distress.  HENT:  Head: Normocephalic and atraumatic.  Eyes: Conjunctivae and EOM are normal. Pupils are equal, round, and reactive to light. No scleral icterus.  Neck: Normal range of motion.  Cardiovascular: Normal rate, regular rhythm and normal heart sounds.  Exam reveals no gallop and no friction rub.   No murmur heard. Pulmonary/Chest: Effort normal and breath sounds normal. No respiratory distress.  Abdominal: Soft. Bowel sounds are normal. She exhibits no distension and no mass. There is no tenderness. There is no guarding.  Neurological: She is alert and oriented to person, place, and time.  Speech is clear and goal oriented, follows commands Major Cranial nerves without deficit, no facial droop Normal strength in upper and lower extremities bilaterally including dorsiflexion and plantar flexion, strong and equal grip strength Sensation normal to light and sharp touch Moves extremities without ataxia, coordination intact Normal finger to nose and rapid alternating movements Neg romberg, no pronator drift Normal gait Normal heel-shin and balance   Skin: Skin is warm and dry. She is not diaphoretic.    ED Course  Procedures (including critical care  time) Labs Review Labs Reviewed  BASIC METABOLIC PANEL - Abnormal; Notable for the following:    Glucose, Bld 140 (*)    GFR calc non Af Amer 80 (*)    All other components within normal limits  PROTIME-INR - Abnormal; Notable for the following:    Prothrombin Time 25.5 (*)    INR 2.32 (*)    All other components within normal limits  URINE CULTURE  CBC  URINALYSIS, ROUTINE W REFLEX MICROSCOPIC    Imaging Review No results found.   EKG Interpretation   Date/Time:  Saturday June 27 2014 19:14:39 EDT Ventricular Rate:  91 PR Interval:    QRS Duration: 98 QT Interval:  358 QTC Calculation: 440 R Axis:   80 Text Interpretation:  Atrial fibrillation Nonspecific T wave abnormality ,  probably digitalis effect Abnormal ECG No significant change was found  Confirmed by Wyvonnia Dusky  MD, STEPHEN [id number]) on 06/27/2014 8:02:08 PM      MDM   Final diagnoses:  None   9:49 PM BP 148/92  Pulse 64  Temp(Src) 97.6 F (36.4 C) (Oral)  Resp 19  Ht 5\' 4"  (1.626 m)  Wt 160 lb (72.576 kg)  BMI 27.45 kg/m2  SpO2 100% Patient with htn, visual disturbance, disequilibrium and disorientation. Resolved at this time with No focal neuro deficits.. I have concern for possible TIA> ABCD2 score of 4 = moderate risk. Awaiting urinalysis. Her EKG shows rate controlled Afib which is unchanged.  Her CT head is negative had her labs are otherwise unremarkable. Her INR is therapeutic.    Patient UA has returned and is negative for infeciton. I have spoken with Dr. Nicole Kindred who states it's possible that patient had a post fossa CVA, however she has no apparent deficits at this time. As she is already ot therapeutic level on her INR, there is no other intervention necessary if she did have a CVA.  Repeat neuro exam is without focal deficit.  Patient / Family / Caregiver informed of clinical course, understand medical decision-making process, and agree with plan. Patient seen in shared visit with Dr.  Wyvonnia Dusky. She will discharged to follow up with her PCP.  I personally reviewed the imaging tests through PACS system. I have reviewed and interpreted Lab values. I reviewed available ER/hospitalization records through the EMR   The patient appears reasonably screened and/or stabilized for discharge and I doubt any other medical condition or other [facility] requiring further screening, evaluation, or treatment in the ED at this time prior to discharge. Return precautions discussed.  Margarita Mail, PA-C 06/30/14 1720

## 2014-06-27 NOTE — ED Notes (Signed)
Pt states she had dizziness and started seeing things move to her and so it made her check her BP at home.

## 2014-06-27 NOTE — Discharge Instructions (Signed)
Transient Ischemic Attack A transient ischemic attack (TIA) is a "warning stroke" that causes stroke-like symptoms. Unlike a stroke, a TIA does not cause permanent damage to the brain. The symptoms of a TIA can happen very fast and do not last long. It is important to know the symptoms of a TIA and what to do. This can help prevent a major stroke or death. CAUSES   A TIA is caused by a temporary blockage in an artery in the brain or neck (carotid artery). The blockage does not allow the brain to get the blood supply it needs and can cause different symptoms. The blockage can be caused by either:  A blood clot.  Fatty buildup (plaque) in a neck or brain artery. RISK FACTORS  High blood pressure (hypertension).  High cholesterol.  Diabetes mellitus.  Heart disease.  The build up of plaque in the blood vessels (peripheral artery disease or atherosclerosis).  The build up of plaque in the blood vessels providing blood and oxygen to the brain (carotid artery stenosis).  An abnormal heart rhythm (atrial fibrillation).  Obesity.  Smoking.  Taking oral contraceptives (especially in combination with smoking).  Physical inactivity.  A diet high in fats, salt (sodium), and calories.  Alcohol use.  Use of illegal drugs (especially cocaine and methamphetamine).  Being female.  Being African American.  Being over the age of 67.  Family history of stroke.  Previous history of blood clots, stroke, TIA, or heart attack.  Sickle cell disease. SYMPTOMS  TIA symptoms are the same as a stroke but are temporary. These symptoms usually develop suddenly, or may be newly present upon awakening from sleep:  Sudden weakness or numbness of the face, arm, or leg, especially on one side of the body.  Sudden trouble walking or difficulty moving arms or legs.  Sudden confusion.  Sudden personality changes.  Trouble speaking (aphasia) or understanding.  Difficulty swallowing.  Sudden  trouble seeing in one or both eyes.  Double vision.  Dizziness.  Loss of balance or coordination.  Sudden severe headache with no known cause.  Trouble reading or writing.  Loss of bowel or bladder control.  Loss of consciousness. DIAGNOSIS  Your caregiver may be able to determine the presence or absence of a TIA based on your symptoms, history, and physical exam. Computed tomography (CT scan) of the brain is usually performed to help identify a TIA. Other tests may be done to diagnose a TIA. These tests may include:  Electrocardiography.  Continuous heart monitoring.  Echocardiography.  Carotid ultrasonography.  Magnetic resonance imaging (MRI).  A scan of the brain circulation.  Blood tests. PREVENTION  The risk of a TIA can be decreased by appropriately treating high blood pressure, high cholesterol, diabetes, heart disease, and obesity and by quitting smoking, limiting alcohol, and staying physically active. TREATMENT  Time is of the essence. Since the symptoms of TIA are the same as a stroke, it is important to seek treatment as soon as possible because you may need a medicine to dissolve the clot (thrombolytic) that cannot be given if too much time has passed. Treatment options vary. Treatment options may include rest, oxygen, intravenous (IV) fluids, and medicines to thin the blood (anticoagulants). Medicines and diet may be used to address diabetes, high blood pressure, and other risk factors. Measures will be taken to prevent short-term and long-term complications, including infection from breathing foreign material into the lungs (aspiration pneumonia), blood clots in the legs, and falls. Treatment options include procedures  to either remove plaque in the carotid arteries or dilate carotid arteries that have narrowed due to plaque. Those procedures are:  Carotid endarterectomy.  Carotid angioplasty and stenting. HOME CARE INSTRUCTIONS   Take all medicines prescribed  by your caregiver. Follow the directions carefully. Medicines may be used to control risk factors for a stroke. Be sure you understand all your medicine instructions.  You may be told to take aspirin or the anticoagulant warfarin. Warfarin needs to be taken exactly as instructed.  Taking too much or too little warfarin is dangerous. Too much warfarin increases the risk of bleeding. Too little warfarin continues to allow the risk for blood clots. While taking warfarin, you will need to have regular blood tests to measure your blood clotting time. A PT blood test measures how long it takes for blood to clot. Your PT is used to calculate another value called an INR. Your PT and INR help your caregiver to adjust your dose of warfarin. The dose can change for many reasons. It is critically important that you take warfarin exactly as prescribed.  Many foods, especially foods high in vitamin K can interfere with warfarin and affect the PT and INR. Foods high in vitamin K include spinach, kale, broccoli, cabbage, collard and turnip greens, brussels sprouts, peas, cauliflower, seaweed, and parsley as well as beef and pork liver, green tea, and soybean oil. You should eat a consistent amount of foods high in vitamin K. Avoid major changes in your diet, or notify your caregiver before changing your diet. Arrange a visit with a dietitian to answer your questions.  Many medicines can interfere with warfarin and affect the PT and INR. You must tell your caregiver about any and all medicines you take, this includes all vitamins and supplements. Be especially cautious with aspirin and anti-inflammatory medicines. Do not take or discontinue any prescribed or over-the-counter medicine except on the advice of your caregiver or pharmacist.  Warfarin can have side effects, such as excessive bruising or bleeding. You will need to hold pressure over cuts for longer than usual. Your caregiver or pharmacist will discuss other  potential side effects.  Avoid sports or activities that may cause injury or bleeding.  Be mindful when shaving, flossing your teeth, or handling sharp objects.  Alcohol can change the body's ability to handle warfarin. It is best to avoid alcoholic drinks or consume only very small amounts while taking warfarin. Notify your caregiver if you change your alcohol intake.  Notify your dentist or other caregivers before procedures.  Eat a diet that includes 5 or more servings of fruits and vegetables each day. This may reduce the risk of stroke. Certain diets may be prescribed to address high blood pressure, high cholesterol, diabetes, or obesity.  A low-sodium, low-saturated fat, low-trans fat, low-cholesterol diet is recommended to manage high blood pressure.  A low-saturated fat, low-trans fat, low-cholesterol, and high-fiber diet may control cholesterol levels.  A controlled-carbohydrate, controlled-sugar diet is recommended to manage diabetes.  A reduced-calorie, low-sodium, low-saturated fat, low-trans fat, low-cholesterol diet is recommended to manage obesity.  Maintain a healthy weight.  Stay physically active. It is recommended that you get at least 30 minutes of activity on most or all days.  Do not smoke.  Limit alcohol use even if you are not taking warfarin. Moderate alcohol use is considered to be:  No more than 2 drinks each day for men.  No more than 1 drink each day for nonpregnant women.  Stop drug abuse.  Home safety. A safe home environment is important to reduce the risk of falls. Your caregiver may arrange for specialists to evaluate your home. Having grab bars in the bedroom and bathroom is often important. Your caregiver may arrange for equipment to be used at home, such as raised toilets and a seat for the shower.  Follow all instructions for follow-up with your caregiver. This is very important. This includes any referrals and lab tests. Proper follow up can  prevent a stroke or another TIA from occurring. SEEK MEDICAL CARE IF:  You have personality changes.  You have difficulty swallowing.  You are seeing double.  You have dizziness.  You have a fever.  You have skin breakdown. SEEK IMMEDIATE MEDICAL CARE IF:  Any of these symptoms may represent a serious problem that is an emergency. Do not wait to see if the symptoms will go away. Get medical help right away. Call your local emergency services (911 in [place] Do not drive yourself to the hospital.  You have sudden weakness or numbness of the face, arm, or leg, especially on one side of the body.  You have sudden trouble walking or difficulty moving arms or legs.  You have sudden confusion.  You have trouble speaking (aphasia) or understanding.  You have sudden trouble seeing in one or both eyes.  You have a loss of balance or coordination.  You have a sudden, severe headache with no known cause.  You have new chest pain or an irregular heartbeat.  You have a partial or total loss of consciousness. MAKE SURE YOU:   Understand these instructions.  Will watch your condition.  Will get help right away if you are not doing well or get worse. Document Released: 09/13/2005 Document Revised: 12/09/2013 Document Reviewed: 01/27/2010 [facility] Patient Information 2015 [place], [place]. This information is not intended to replace advice given to you by your health care provider. Make sure you discuss any questions you have with your health care provider.  Visual Disturbances You have had a disturbance in your vision. This may be caused by various conditions, such as:  Migraines. Migraine headaches are often preceded by a disturbance in vision. Blind spots or light flashes are followed by a headache. This type of visual disturbance is temporary. It does not damage the eye.  Glaucoma. This is caused by increased pressure in the eye. Symptoms include haziness, blurred vision, or seeing  rainbow colored circles when looking at bright lights. Partial or complete visual loss can occur. You may or may not experience eye pain. Visual loss may be gradual or sudden and is irreversible. Glaucoma is the leading cause of blindness.  Retina problems. Vision will be reduced if the retina becomes detached or if there is a circulation problem as with diabetes, high blood pressure, or a mini-stroke. Symptoms include seeing "floaters," flashes of light, or shadows, as if a curtain has fallen over your eye.  Optic nerve problems. The main nerve in your eye can be damaged by redness, soreness, and swelling (inflammation), poor circulation, drugs, and toxins. It is very important to have a complete exam done by a specialist to determine the exact cause of your eye problem. The specialist may recommend medicines or surgery, depending on the cause of the problem. This can help prevent further loss of vision or reduce the risk of having a stroke. Contact the caregiver to whom you have been referred and arrange for follow-up care right away. SEEK IMMEDIATE MEDICAL CARE IF:   Your  vision gets worse.  You develop severe headaches.  You have any weakness or numbness in the face, arms, or legs.  You have any trouble speaking or walking. Document Released: 01/11/2005 Document Revised: 02/26/2012 Document Reviewed: 05/04/2010 [facility] Patient Information 2015 [place], [place]. This information is not intended to replace advice given to you by your health care provider. Make sure you discuss any questions you have with your health care provider.

## 2014-06-29 LAB — URINE CULTURE
Colony Count: NO GROWTH
Culture: NO GROWTH
SPECIAL REQUESTS: NORMAL

## 2014-06-30 NOTE — ED Provider Notes (Signed)
Medical screening examination/treatment/procedure(s) were conducted as a shared visit with non-physician practitioner(s) and myself.  I personally evaluated the patient during the encounter.  Patient with light perception only at baseline with episode of "furniture moving toward her" that started last night and persisted until this morning.  Associated with elevated BP.  No headache, chest pain, focal weakness, or numbness. Asymptomatic now, vision at baseline.  INR therapeutic.  Possible TIA but patient already adequately anticoagulated. D/w neuro. Unable to get MRI with PPM.   EKG Interpretation   Date/Time:  Saturday June 27 2014 19:14:39 EDT Ventricular Rate:  91 PR Interval:    QRS Duration: 98 QT Interval:  358 QTC Calculation: 440 R Axis:   80 Text Interpretation:  Atrial fibrillation Nonspecific T wave abnormality ,  probably digitalis effect Abnormal ECG No significant change was found  Confirmed by Wyvonnia Dusky  MD, Carmie Lanpher [phone]) on 06/27/2014 8:02:08 PM       Ezequiel Essex, MD 06/30/14 1758

## 2014-07-02 DIAGNOSIS — I10 Essential (primary) hypertension: Secondary | ICD-10-CM | POA: Diagnosis not present

## 2014-07-02 DIAGNOSIS — R42 Dizziness and giddiness: Secondary | ICD-10-CM | POA: Diagnosis not present

## 2014-07-02 DIAGNOSIS — E7800 Pure hypercholesterolemia, unspecified: Secondary | ICD-10-CM | POA: Diagnosis not present

## 2014-07-06 DIAGNOSIS — B351 Tinea unguium: Secondary | ICD-10-CM | POA: Diagnosis not present

## 2014-07-06 DIAGNOSIS — M79609 Pain in unspecified limb: Secondary | ICD-10-CM | POA: Diagnosis not present

## 2014-07-13 DIAGNOSIS — IMO0002 Reserved for concepts with insufficient information to code with codable children: Secondary | ICD-10-CM | POA: Diagnosis not present

## 2014-07-13 DIAGNOSIS — G5750 Tarsal tunnel syndrome, unspecified lower limb: Secondary | ICD-10-CM | POA: Diagnosis not present

## 2014-07-13 DIAGNOSIS — M7150 Other bursitis, not elsewhere classified, unspecified site: Secondary | ICD-10-CM | POA: Diagnosis not present

## 2014-07-22 ENCOUNTER — Ambulatory Visit (INDEPENDENT_AMBULATORY_CARE_PROVIDER_SITE_OTHER): Payer: Medicare Other | Admitting: *Deleted

## 2014-07-22 ENCOUNTER — Ambulatory Visit (INDEPENDENT_AMBULATORY_CARE_PROVIDER_SITE_OTHER): Payer: Medicare Other | Admitting: Pharmacist

## 2014-07-22 DIAGNOSIS — Z950 Presence of cardiac pacemaker: Secondary | ICD-10-CM

## 2014-07-22 DIAGNOSIS — I4891 Unspecified atrial fibrillation: Secondary | ICD-10-CM

## 2014-07-22 DIAGNOSIS — I4821 Permanent atrial fibrillation: Secondary | ICD-10-CM

## 2014-07-22 DIAGNOSIS — Z5181 Encounter for therapeutic drug level monitoring: Secondary | ICD-10-CM

## 2014-07-22 LAB — MDC_IDC_ENUM_SESS_TYPE_INCLINIC
Battery Remaining Longevity: 147.6 mo
Battery Voltage: 2.98 V
Brady Statistic RV Percent Paced: 11 %
Implantable Pulse Generator Model: 1110
Implantable Pulse Generator Serial Number: 7298578
Lead Channel Pacing Threshold Amplitude: 1.25 V
Lead Channel Pacing Threshold Pulse Width: 0.5 ms
Lead Channel Pacing Threshold Pulse Width: 0.5 ms
Lead Channel Setting Pacing Pulse Width: 0.5 ms
Lead Channel Setting Sensing Sensitivity: 2 mV
MDC IDC MSMT LEADCHNL RV IMPEDANCE VALUE: 437.5 Ohm
MDC IDC MSMT LEADCHNL RV PACING THRESHOLD AMPLITUDE: 1.25 V
MDC IDC MSMT LEADCHNL RV SENSING INTR AMPL: 9.3 mV
MDC IDC SESS DTM: 20150805091813
MDC IDC SET LEADCHNL RV PACING AMPLITUDE: 2.5 V

## 2014-07-22 LAB — POCT INR: INR: 1.6

## 2014-07-22 NOTE — Progress Notes (Signed)
Pacemaker check in clinic. Normal device function. Thresholds, sensing, impedances consistent with previous measurements. Device programmed to maximize longevity. No high ventricular rates noted. Device programmed at appropriate safety margins---changed pulse width from 0.4 to 0.35ms. Histogram distribution appropriate for patient activity level. Device programmed to optimize intrinsic conduction. Estimated longevity 11.3-12.18yrs. ROV w/ Dr. Caryl Comes in 90mo.

## 2014-07-23 ENCOUNTER — Other Ambulatory Visit: Payer: Self-pay | Admitting: Podiatry

## 2014-07-23 DIAGNOSIS — G5750 Tarsal tunnel syndrome, unspecified lower limb: Secondary | ICD-10-CM | POA: Diagnosis not present

## 2014-07-23 DIAGNOSIS — M7150 Other bursitis, not elsewhere classified, unspecified site: Secondary | ICD-10-CM | POA: Diagnosis not present

## 2014-07-23 DIAGNOSIS — D485 Neoplasm of uncertain behavior of skin: Secondary | ICD-10-CM | POA: Diagnosis not present

## 2014-07-23 DIAGNOSIS — D2370 Other benign neoplasm of skin of unspecified lower limb, including hip: Secondary | ICD-10-CM | POA: Diagnosis not present

## 2014-07-30 DIAGNOSIS — B351 Tinea unguium: Secondary | ICD-10-CM | POA: Diagnosis not present

## 2014-07-30 DIAGNOSIS — M7150 Other bursitis, not elsewhere classified, unspecified site: Secondary | ICD-10-CM | POA: Diagnosis not present

## 2014-07-30 DIAGNOSIS — M79609 Pain in unspecified limb: Secondary | ICD-10-CM | POA: Diagnosis not present

## 2014-08-01 IMAGING — CT CT HEAD W/O CM
1 of 2 series · 16 of 30 positions shown, 20 images · non-contrast
Comparison: None.

CLINICAL DATA: Headache.  Hypertension

EXAM:
CT HEAD WITHOUT CONTRAST
TECHNIQUE: Contiguous axial images were obtained from the base of the skull
through the vertex without intravenous contrast.

[Series 3: head 2.0 h70h · axial · 0.42mm/px · z∈[+1415,+1559]mm · 16 of 80 slices shown, 20 images]
[im 4/80  brain]
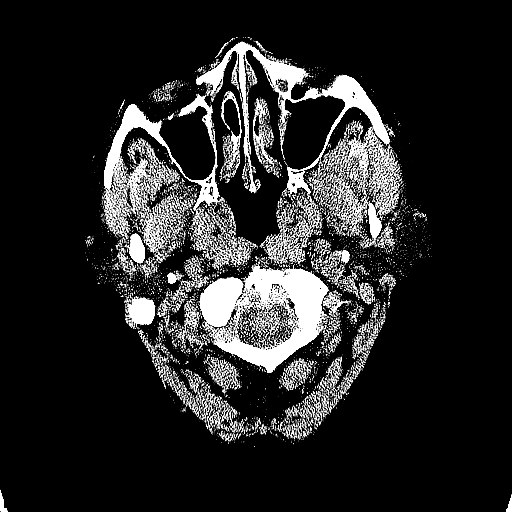
[im 4/80  bone]
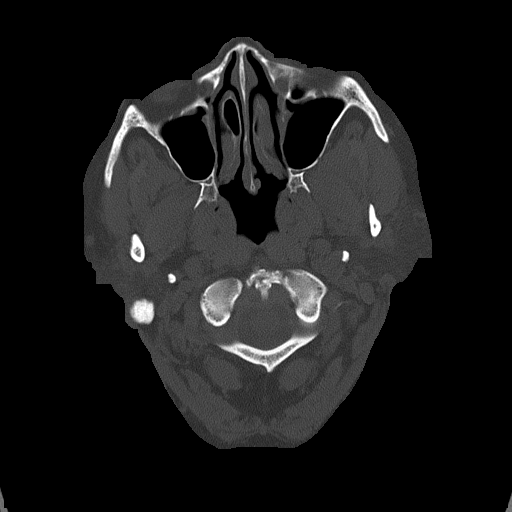
[im 8/80  brain]
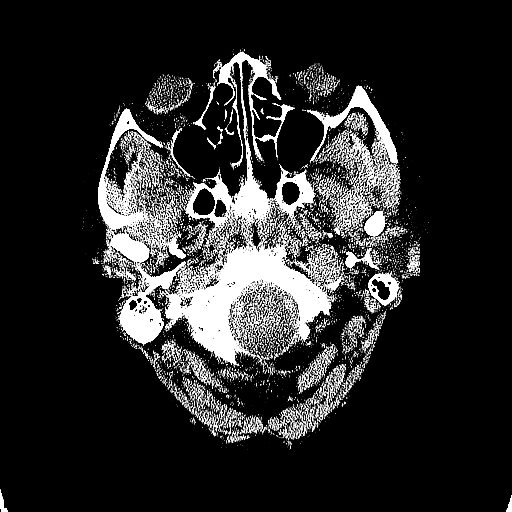
[im 12/80  brain]
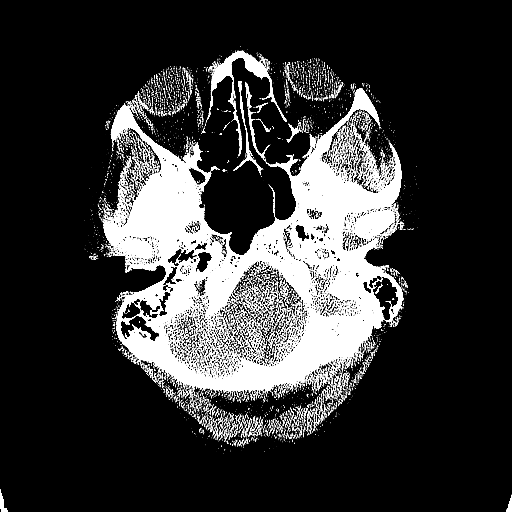
[im 20/80  brain]
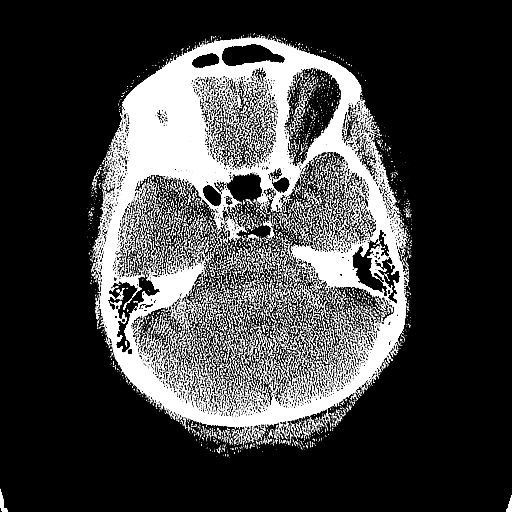
[im 24/80  brain]
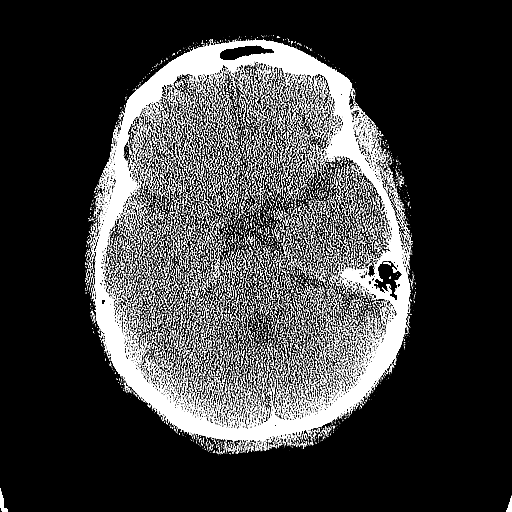
[im 24/80  bone]
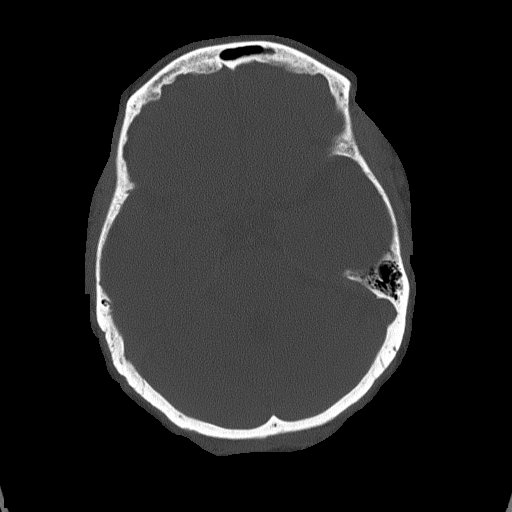
[im 28/80  brain]
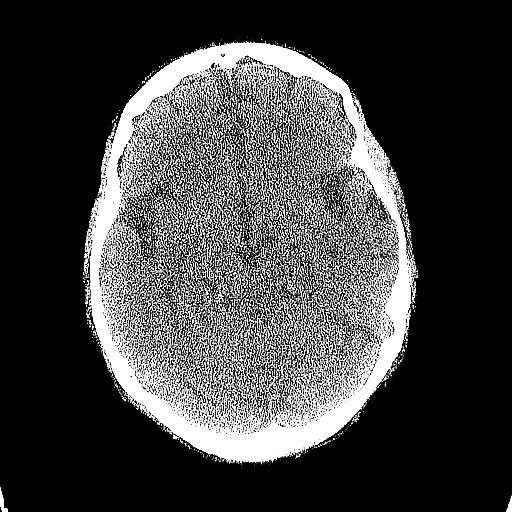
[im 32/80  brain]
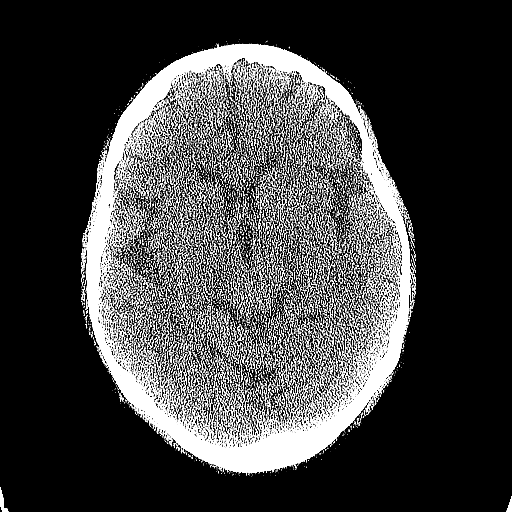
[im 36/80  brain]
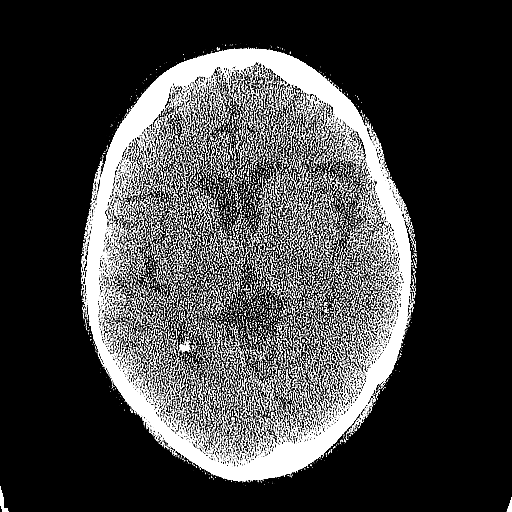
[im 44/80  brain]
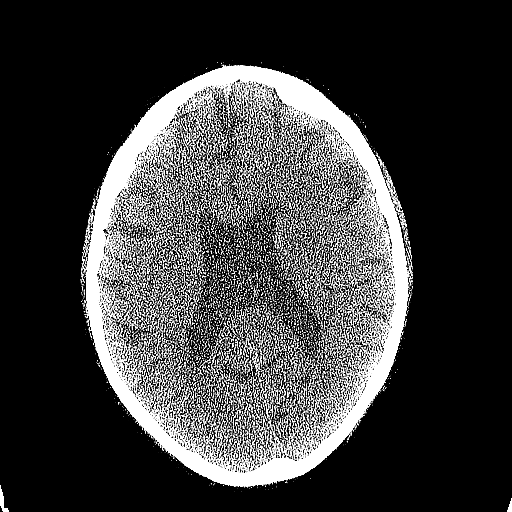
[im 44/80  bone]
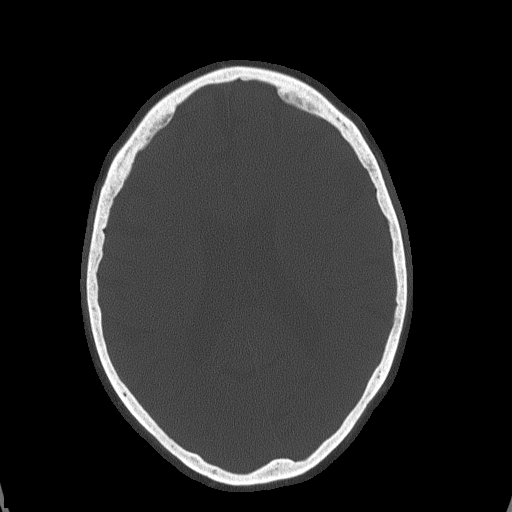
[im 48/80  brain]
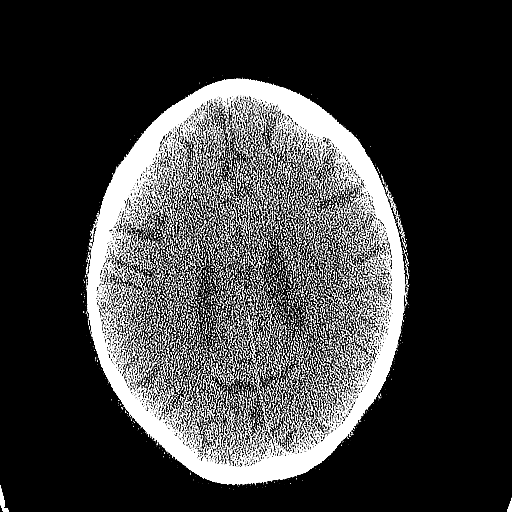
[im 52/80  brain]
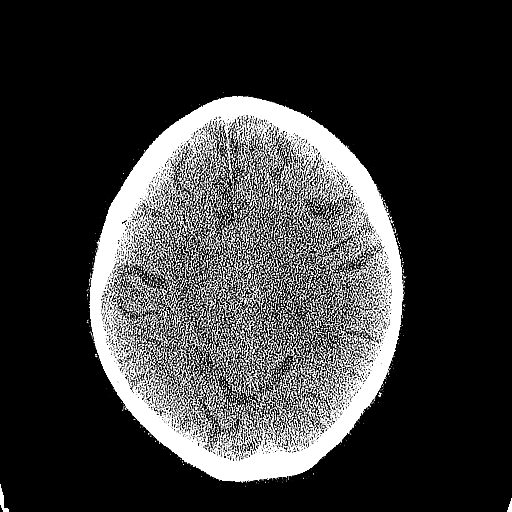
[im 56/80  brain]
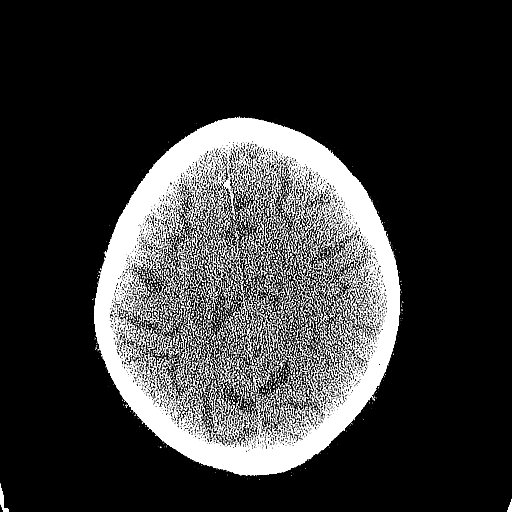
[im 60/80  brain]
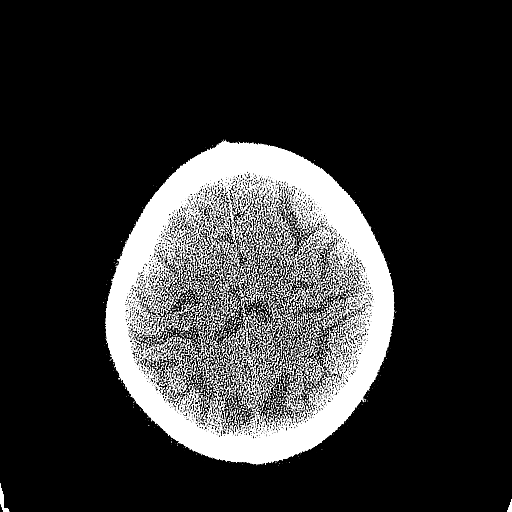
[im 60/80  bone]
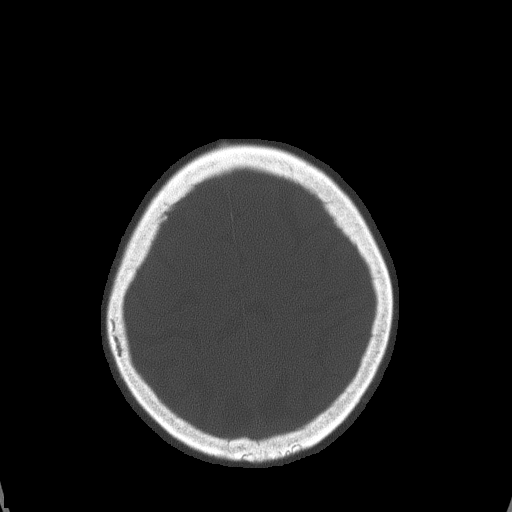
[im 68/80  brain]
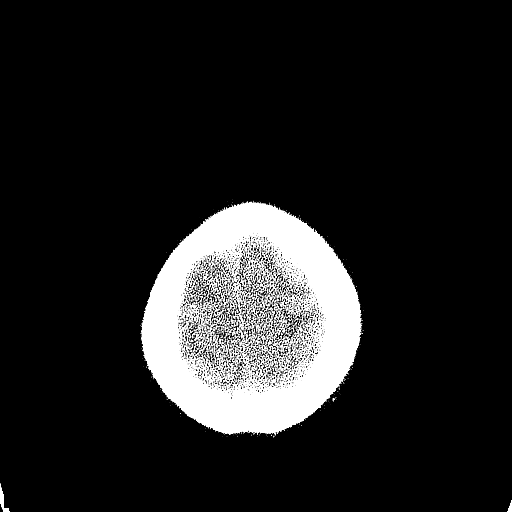
[im 72/80  brain]
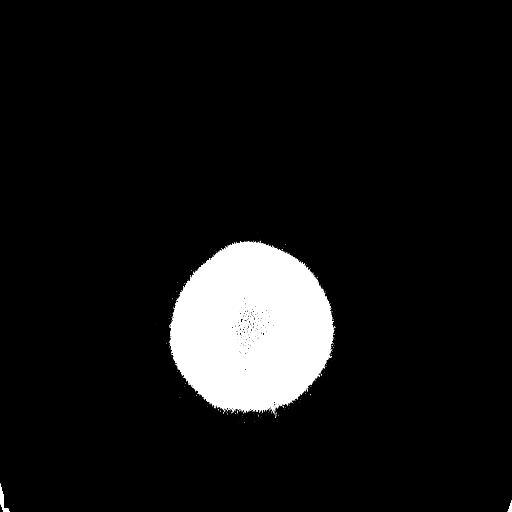
[im 76/80  brain]
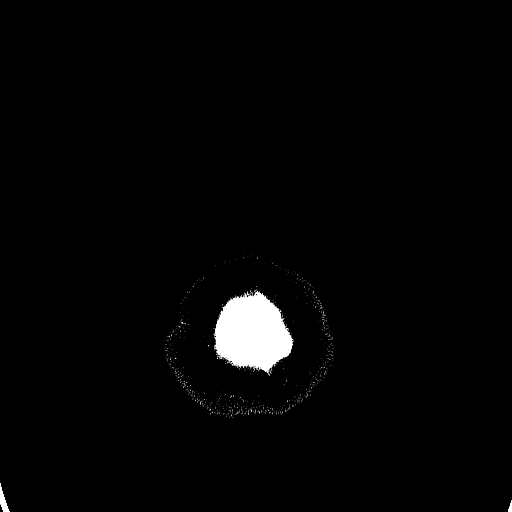

[16 of 30 positions shown; findings below may reference images not displayed]

FINDINGS: There is some cortical atrophy and chronic microvascular ischemic
change. No evidence of acute abnormality including infarct,
hemorrhage, mass lesion, mass effect, midline shift or abnormal
extra-axial fluid collection is identified. Tiny amount of fluid
right mastoid air cells noted. Calvarium intact. No pneumocephalus
or hydrocephalus.
IMPRESSION: No acute finding.

## 2014-08-07 ENCOUNTER — Ambulatory Visit (INDEPENDENT_AMBULATORY_CARE_PROVIDER_SITE_OTHER): Payer: Medicare Other

## 2014-08-07 DIAGNOSIS — Z5181 Encounter for therapeutic drug level monitoring: Secondary | ICD-10-CM

## 2014-08-07 DIAGNOSIS — I4821 Permanent atrial fibrillation: Secondary | ICD-10-CM

## 2014-08-07 DIAGNOSIS — I4891 Unspecified atrial fibrillation: Secondary | ICD-10-CM

## 2014-08-07 LAB — POCT INR: INR: 1.6

## 2014-08-14 ENCOUNTER — Ambulatory Visit (INDEPENDENT_AMBULATORY_CARE_PROVIDER_SITE_OTHER): Payer: Medicare Other | Admitting: Cardiology

## 2014-08-14 ENCOUNTER — Encounter: Payer: Self-pay | Admitting: Internal Medicine

## 2014-08-14 ENCOUNTER — Encounter: Payer: Self-pay | Admitting: Cardiology

## 2014-08-14 VITALS — BP 110/70 | HR 64 | Ht 64.0 in | Wt 170.0 lb

## 2014-08-14 DIAGNOSIS — I4891 Unspecified atrial fibrillation: Secondary | ICD-10-CM | POA: Diagnosis not present

## 2014-08-14 DIAGNOSIS — I495 Sick sinus syndrome: Secondary | ICD-10-CM

## 2014-08-14 DIAGNOSIS — I10 Essential (primary) hypertension: Secondary | ICD-10-CM

## 2014-08-14 DIAGNOSIS — I4821 Permanent atrial fibrillation: Secondary | ICD-10-CM

## 2014-08-14 NOTE — Patient Instructions (Signed)
Your physician recommends that you continue on your current medications as directed. Please refer to the Current Medication list given to you today.  Your physician wants you to follow-up in: 6 months with Dr Turner You will receive a reminder letter in the mail two months in advance. If you don't receive a letter, please call our office to schedule the follow-up appointment.  

## 2014-08-14 NOTE — Progress Notes (Signed)
[place], [address], [place]  [address] Phone: [phone] Fax:  [phone]  Date:  08/14/2014   ID:  Megan [name], DOB January 04, 1933, MRN [id number]  PCP:  Reginia Naas, MD  Cardiologist:  Fransico Him, MD     History of Present Illness: Megan [name] is a 80 y.o. female with a history of chronic atrial fibrillation, chronic systemic anticoagulation, tachybrady syndrome s/p PPM and HTN who presents today for followup. She is doing well. She denies any chest pain, SOB, DOE, dizziness, palpitations or syncope. She occasionally has some mild LE edema.    Wt Readings from Last 3 Encounters:  08/14/14 170 lb (77.111 kg)  06/27/14 160 lb (72.576 kg)  12/25/13 170 lb 12.8 oz (77.474 kg)     Past Medical History  Diagnosis Date  . Pacemaker [facility]     10/13  . Blind   . GERD (gastroesophageal reflux disease)   . Dyslipidemia   . Current use of long term anticoagulation   . Hypertension   . Diverticulosis   . Vitamin D deficiency disease   . Permanent atrial fibrillation   . Tachycardia-bradycardia syndrome     s/p PPM  . Encounter for long-term (current) use of other medications   . Peripheral neuropathy     Current Outpatient Prescriptions  Medication Sig Dispense Refill  . acetaminophen (TYLENOL) 500 MG tablet Take 500 mg by mouth every 6 (six) hours as needed for moderate pain or headache.       . cholecalciferol (VITAMIN D) 1000 UNITS tablet Take 1,000 Units by mouth daily.      . Efinaconazole (JUBLIA) 10 % SOLN Apply 1 application topically 2 (two) times daily.      . fenofibrate 160 MG tablet Take 160 mg by mouth daily.      . hydrochlorothiazide (MICROZIDE) 12.5 MG capsule Take 12.5 mg by mouth daily.      . meclizine (ANTIVERT) 25 MG tablet Take 25 mg by mouth daily. Dizziness      . metoprolol (LOPRESSOR) 50 MG tablet Take 50 mg by mouth 2 (two) times daily.       . Multiple Vitamins-Minerals (ICAPS AREDS FORMULA PO) Take 1 capsule by  mouth 2 (two) times daily.       . Omega-3 Fatty Acids (FISH OIL) 1000 MG CAPS Take 1,000 mg by mouth daily.       Vladimir Faster Glycol-Propyl Glycol (SYSTANE) 0.4-0.3 % SOLN Place 2 drops into both eyes 2 (two) times daily.       Marland Kitchen terbinafine (LAMISIL) 250 MG tablet Take 250 mg by mouth daily.      . verapamil (CALAN-SR) 240 MG CR tablet Take 240 mg by mouth daily.       . vitamin B-12 (CYANOCOBALAMIN) 250 MCG tablet Take 250 mcg by mouth daily.       Marland Kitchen warfarin (COUMADIN) 2.5 MG tablet Take 2.5-3.75 mg by mouth See admin instructions. Take 2.5mg  by mouth on Monday and Friday. Take 3.75mg  on Sunday, Tuesday, Wednesday, Thursday and Saturday.       No current facility-administered medications for this visit.    Allergies:    Allergies  Allergen Reactions  . Caffeine Palpitations  . Other Rash    "Preservatives"    Social History:  The patient  reports that she quit smoking about 47 years ago. Her smoking use included Cigarettes. She smoked 0.00 packs per day for 3 years. She has never used smokeless tobacco. She reports  that she drinks alcohol. She reports that she does not use illicit drugs.   Family History:  The patient's family history includes Diabetes in her mother, sister, and another family member; Hypertension in her mother and another family member; Stroke in her mother.   ROS:  Please see the history of present illness.      All other systems reviewed and negative.   PHYSICAL EXAM: VS:  BP 110/70  Pulse 64  Ht 5\' 4"  (1.626 m)  Wt 170 lb (77.111 kg)  BMI 29.17 kg/m2 Well nourished, well developed, in no acute distress HEENT: normal Neck: no JVD Cardiac:  normal S1, S2; RRR; no murmur Lungs:  clear to auscultation bilaterally, no wheezing, rhonchi or rales Abd: soft, nontender, no hepatomegaly Ext: no edema Skin: warm and dry Neuro:  CNs 2-12 intact, no focal abnormalities noted      ASSESSMENT AND PLAN:  1. Chronic atrial fibrillation rate controlled - continue  Verapamil/digoxin/Warfarin/metoprolol  2. Chronic systemic anticoagulation 3. HTN - well controlled - Continue HCTZ/Verapamil/metoprolol  4. Tachybrady syndrome s/p PPM   Followup with me in 6 months   Signed, Fransico Him, MD 08/14/2014 9:32 AM

## 2014-08-20 ENCOUNTER — Ambulatory Visit (INDEPENDENT_AMBULATORY_CARE_PROVIDER_SITE_OTHER): Payer: Medicare Other | Admitting: *Deleted

## 2014-08-20 DIAGNOSIS — I4891 Unspecified atrial fibrillation: Secondary | ICD-10-CM | POA: Diagnosis not present

## 2014-08-20 DIAGNOSIS — Z5181 Encounter for therapeutic drug level monitoring: Secondary | ICD-10-CM

## 2014-08-20 DIAGNOSIS — I4821 Permanent atrial fibrillation: Secondary | ICD-10-CM

## 2014-08-20 LAB — POCT INR: INR: 1.8

## 2014-08-27 DIAGNOSIS — G5760 Lesion of plantar nerve, unspecified lower limb: Secondary | ICD-10-CM | POA: Diagnosis not present

## 2014-08-27 DIAGNOSIS — M79609 Pain in unspecified limb: Secondary | ICD-10-CM | POA: Diagnosis not present

## 2014-08-27 DIAGNOSIS — M779 Enthesopathy, unspecified: Secondary | ICD-10-CM | POA: Diagnosis not present

## 2014-08-27 DIAGNOSIS — B351 Tinea unguium: Secondary | ICD-10-CM | POA: Diagnosis not present

## 2014-09-03 ENCOUNTER — Ambulatory Visit (INDEPENDENT_AMBULATORY_CARE_PROVIDER_SITE_OTHER): Payer: Medicare Other

## 2014-09-03 DIAGNOSIS — I4891 Unspecified atrial fibrillation: Secondary | ICD-10-CM

## 2014-09-03 DIAGNOSIS — M65979 Unspecified synovitis and tenosynovitis, unspecified ankle and foot: Secondary | ICD-10-CM | POA: Diagnosis not present

## 2014-09-03 DIAGNOSIS — Z5181 Encounter for therapeutic drug level monitoring: Secondary | ICD-10-CM

## 2014-09-03 DIAGNOSIS — G5760 Lesion of plantar nerve, unspecified lower limb: Secondary | ICD-10-CM | POA: Diagnosis not present

## 2014-09-03 DIAGNOSIS — M659 Synovitis and tenosynovitis, unspecified: Secondary | ICD-10-CM | POA: Diagnosis not present

## 2014-09-03 DIAGNOSIS — M779 Enthesopathy, unspecified: Secondary | ICD-10-CM | POA: Diagnosis not present

## 2014-09-03 DIAGNOSIS — I4821 Permanent atrial fibrillation: Secondary | ICD-10-CM

## 2014-09-03 LAB — POCT INR: INR: 2

## 2014-09-24 ENCOUNTER — Ambulatory Visit (INDEPENDENT_AMBULATORY_CARE_PROVIDER_SITE_OTHER): Payer: Medicare Other | Admitting: Pharmacist

## 2014-09-24 DIAGNOSIS — I4821 Permanent atrial fibrillation: Secondary | ICD-10-CM

## 2014-09-24 DIAGNOSIS — G5762 Lesion of plantar nerve, left lower limb: Secondary | ICD-10-CM | POA: Diagnosis not present

## 2014-09-24 DIAGNOSIS — G5752 Tarsal tunnel syndrome, left lower limb: Secondary | ICD-10-CM | POA: Diagnosis not present

## 2014-09-24 DIAGNOSIS — I482 Chronic atrial fibrillation: Secondary | ICD-10-CM

## 2014-09-24 DIAGNOSIS — M7751 Other enthesopathy of right foot: Secondary | ICD-10-CM | POA: Diagnosis not present

## 2014-09-24 DIAGNOSIS — M7752 Other enthesopathy of left foot: Secondary | ICD-10-CM | POA: Diagnosis not present

## 2014-09-24 DIAGNOSIS — I4891 Unspecified atrial fibrillation: Secondary | ICD-10-CM | POA: Diagnosis not present

## 2014-09-24 DIAGNOSIS — Z5181 Encounter for therapeutic drug level monitoring: Secondary | ICD-10-CM | POA: Diagnosis not present

## 2014-09-24 LAB — POCT INR: INR: 2

## 2014-10-01 DIAGNOSIS — M792 Neuralgia and neuritis, unspecified: Secondary | ICD-10-CM | POA: Diagnosis not present

## 2014-10-06 DIAGNOSIS — Z23 Encounter for immunization: Secondary | ICD-10-CM | POA: Diagnosis not present

## 2014-10-15 ENCOUNTER — Ambulatory Visit (INDEPENDENT_AMBULATORY_CARE_PROVIDER_SITE_OTHER): Payer: Medicare Other | Admitting: *Deleted

## 2014-10-15 DIAGNOSIS — I482 Chronic atrial fibrillation: Secondary | ICD-10-CM

## 2014-10-15 DIAGNOSIS — I4821 Permanent atrial fibrillation: Secondary | ICD-10-CM

## 2014-10-15 DIAGNOSIS — Z5181 Encounter for therapeutic drug level monitoring: Secondary | ICD-10-CM | POA: Diagnosis not present

## 2014-10-15 DIAGNOSIS — I4891 Unspecified atrial fibrillation: Secondary | ICD-10-CM

## 2014-10-15 LAB — POCT INR: INR: 2.7

## 2014-11-09 ENCOUNTER — Ambulatory Visit (INDEPENDENT_AMBULATORY_CARE_PROVIDER_SITE_OTHER): Payer: Medicare Other | Admitting: *Deleted

## 2014-11-09 DIAGNOSIS — I4821 Permanent atrial fibrillation: Secondary | ICD-10-CM

## 2014-11-09 DIAGNOSIS — I4891 Unspecified atrial fibrillation: Secondary | ICD-10-CM

## 2014-11-09 DIAGNOSIS — Z5181 Encounter for therapeutic drug level monitoring: Secondary | ICD-10-CM

## 2014-11-09 DIAGNOSIS — I482 Chronic atrial fibrillation: Secondary | ICD-10-CM | POA: Diagnosis not present

## 2014-11-09 LAB — POCT INR: INR: 2.6

## 2014-11-26 ENCOUNTER — Encounter (HOSPITAL_COMMUNITY): Payer: Self-pay | Admitting: Internal Medicine

## 2014-12-14 ENCOUNTER — Ambulatory Visit (INDEPENDENT_AMBULATORY_CARE_PROVIDER_SITE_OTHER): Payer: Medicare Other | Admitting: Pharmacist

## 2014-12-14 DIAGNOSIS — I482 Chronic atrial fibrillation: Secondary | ICD-10-CM

## 2014-12-14 DIAGNOSIS — Z5181 Encounter for therapeutic drug level monitoring: Secondary | ICD-10-CM | POA: Diagnosis not present

## 2014-12-14 DIAGNOSIS — I4891 Unspecified atrial fibrillation: Secondary | ICD-10-CM

## 2014-12-14 DIAGNOSIS — I4821 Permanent atrial fibrillation: Secondary | ICD-10-CM

## 2014-12-14 LAB — POCT INR: INR: 2.9

## 2015-01-05 DIAGNOSIS — I10 Essential (primary) hypertension: Secondary | ICD-10-CM | POA: Diagnosis not present

## 2015-01-05 DIAGNOSIS — Z0000 Encounter for general adult medical examination without abnormal findings: Secondary | ICD-10-CM | POA: Diagnosis not present

## 2015-01-05 DIAGNOSIS — E785 Hyperlipidemia, unspecified: Secondary | ICD-10-CM | POA: Diagnosis not present

## 2015-01-05 DIAGNOSIS — Z23 Encounter for immunization: Secondary | ICD-10-CM | POA: Diagnosis not present

## 2015-01-05 DIAGNOSIS — Z1389 Encounter for screening for other disorder: Secondary | ICD-10-CM | POA: Diagnosis not present

## 2015-01-25 ENCOUNTER — Ambulatory Visit (INDEPENDENT_AMBULATORY_CARE_PROVIDER_SITE_OTHER): Payer: Medicare Other | Admitting: *Deleted

## 2015-01-25 DIAGNOSIS — I4891 Unspecified atrial fibrillation: Secondary | ICD-10-CM | POA: Diagnosis not present

## 2015-01-25 DIAGNOSIS — Z5181 Encounter for therapeutic drug level monitoring: Secondary | ICD-10-CM

## 2015-01-25 DIAGNOSIS — I4821 Permanent atrial fibrillation: Secondary | ICD-10-CM

## 2015-01-25 DIAGNOSIS — I482 Chronic atrial fibrillation: Secondary | ICD-10-CM | POA: Diagnosis not present

## 2015-01-25 LAB — POCT INR: INR: 3.1

## 2015-02-05 DIAGNOSIS — D582 Other hemoglobinopathies: Secondary | ICD-10-CM | POA: Diagnosis not present

## 2015-02-09 DIAGNOSIS — Z1211 Encounter for screening for malignant neoplasm of colon: Secondary | ICD-10-CM | POA: Diagnosis not present

## 2015-02-15 ENCOUNTER — Encounter: Payer: Self-pay | Admitting: *Deleted

## 2015-02-15 ENCOUNTER — Encounter: Payer: Self-pay | Admitting: Internal Medicine

## 2015-02-15 ENCOUNTER — Ambulatory Visit (INDEPENDENT_AMBULATORY_CARE_PROVIDER_SITE_OTHER): Payer: Medicare Other | Admitting: Internal Medicine

## 2015-02-15 ENCOUNTER — Ambulatory Visit (INDEPENDENT_AMBULATORY_CARE_PROVIDER_SITE_OTHER): Payer: Medicare Other | Admitting: Cardiology

## 2015-02-15 VITALS — BP 127/83 | HR 82 | Ht 65.0 in | Wt 175.4 lb

## 2015-02-15 DIAGNOSIS — Z45018 Encounter for adjustment and management of other part of cardiac pacemaker: Secondary | ICD-10-CM | POA: Diagnosis not present

## 2015-02-15 DIAGNOSIS — I495 Sick sinus syndrome: Secondary | ICD-10-CM | POA: Diagnosis not present

## 2015-02-15 DIAGNOSIS — Z950 Presence of cardiac pacemaker: Secondary | ICD-10-CM

## 2015-02-15 DIAGNOSIS — I4821 Permanent atrial fibrillation: Secondary | ICD-10-CM

## 2015-02-15 DIAGNOSIS — I482 Chronic atrial fibrillation: Secondary | ICD-10-CM

## 2015-02-15 DIAGNOSIS — I10 Essential (primary) hypertension: Secondary | ICD-10-CM

## 2015-02-15 LAB — MDC_IDC_ENUM_SESS_TYPE_INCLINIC
Battery Remaining Longevity: 146.4 mo
Battery Voltage: 2.98 V
Brady Statistic RV Percent Paced: 6.1 %
Implantable Pulse Generator Model: 1110
Lead Channel Impedance Value: 475 Ohm
Lead Channel Pacing Threshold Amplitude: 1.25 V
Lead Channel Pacing Threshold Pulse Width: 0.7 ms
Lead Channel Sensing Intrinsic Amplitude: 12 mV
Lead Channel Setting Pacing Amplitude: 2.5 V
Lead Channel Setting Pacing Pulse Width: 0.7 ms
MDC IDC MSMT LEADCHNL RV PACING THRESHOLD AMPLITUDE: 1.25 V
MDC IDC MSMT LEADCHNL RV PACING THRESHOLD PULSEWIDTH: 0.7 ms
MDC IDC PG SERIAL: 7298578
MDC IDC SESS DTM: 20160229144058
MDC IDC SET LEADCHNL RV SENSING SENSITIVITY: 2 mV

## 2015-02-15 MED ORDER — METOPROLOL TARTRATE 25 MG PO TABS: 25.0000 mg | ORAL_TABLET | Freq: Two times a day (BID) | ORAL | Status: DC

## 2015-02-15 MED ORDER — APIXABAN 5 MG PO TABS: 5.0000 mg | ORAL_TABLET | Freq: Two times a day (BID) | ORAL | Status: DC

## 2015-02-15 NOTE — Patient Instructions (Addendum)
Your physician has recommended you make the following change in your medication:  1) STOP COUMADIN 2) START ELIQUIS 5 mg TWICE A DAY [facility] will call you TOMORROW with instructions on how to switch medications. Until then, continue your coumadin as directed for tonight. 3) INCREASE METOPROLOL to 75 mg TWICE A DAY  Your physician recommends that you have lab work TODAY (BMET, CBC, PT/INR)  Your physician wants you to follow-up in: 6 months with Dr. Radford Pax. You will receive a reminder letter in the mail two months in advance. If you don't receive a letter, please call our office to schedule the follow-up appointment.

## 2015-02-15 NOTE — Patient Instructions (Signed)
Your physician recommends that you continue on your current medications as directed. Please refer to the Current Medication list given to you today.  Your physician recommends that you schedule a follow-up appointment in: 4-5 months with Dr. Caryl Comes.

## 2015-02-15 NOTE — Telephone Encounter (Signed)
This encounter was created in error - please disregard.

## 2015-02-15 NOTE — Progress Notes (Signed)
Cardiology Office Note   Date:  02/15/2015   ID:  Luke, Rigsbee [birth year], MRN [id number]  PCP:  Reginia Naas, MD  Cardiologist:   Sueanne Margarita, MD   Chief Complaint  Patient presents with  . Atrial Fibrillation  . Hypertension  . tachycardia/bradycardia syndrome      History of Present Illness: Megan [name] is a 81 y.o. female with a history of chronic atrial fibrillation, chronic systemic anticoagulation, tachybrady syndrome s/p PPM and HTN who presents today for followup. She is doing well. She denies any chest pain,  SOB, DOE, dizziness, palpitations or syncope. She occasionally has some mild LE edema.   She occasionally notices her heart racing when she gets up at night to go to the bathroom.  She saw Dr. Caryl Comes earlier today and noted that 25% of her heart rates were >100bpm and he recommended that we increase metoprolol from 50 to 100mg  BID.  He also recommended considering changing from coumadin to a NOAC.     Past Medical History  Diagnosis Date  . Pacemaker [employer]     10/13  . Blind   . GERD (gastroesophageal reflux disease)   . Dyslipidemia   . Current use of long term anticoagulation   . Hypertension   . Diverticulosis   . Vitamin D deficiency disease   . Permanent atrial fibrillation   . Tachycardia-bradycardia syndrome     s/p PPM  . Encounter for long-term (current) use of other medications   . Peripheral neuropathy     Past Surgical History  Procedure Laterality Date  . Cholecystectomy  2004  . Abdominal hysterectomy  1978  . Partial colectomy  2011    For diverticulosis  . Breast reduction surgery  1990  . Insert / replace / remove pacemaker  09/2012    Single chamber PM - Dr. Radford Pax  . Permanent pacemaker insertion Left 09/25/2012    Procedure: PERMANENT PACEMAKER INSERTION;  Surgeon: Deboraha Sprang, MD;  Location: [facility] CATH LAB;  Service: Cardiovascular;  Laterality: Left;     Current Outpatient Prescriptions  Medication  Sig Dispense Refill  . acetaminophen (TYLENOL) 500 MG tablet Take 500 mg by mouth every 6 (six) hours as needed for moderate pain or headache.     . cholecalciferol (VITAMIN D) 1000 UNITS tablet Take 1,000 Units by mouth daily.    . Efinaconazole (JUBLIA) 10 % SOLN Apply 1 application topically 2 (two) times daily.    . fenofibrate 160 MG tablet Take 160 mg by mouth daily.    . hydrochlorothiazide (MICROZIDE) 12.5 MG capsule Take 12.5 mg by mouth daily.    . meclizine (ANTIVERT) 25 MG tablet Take 25 mg by mouth as needed for dizziness or nausea. Dizziness    . metoprolol (LOPRESSOR) 50 MG tablet Take 50 mg by mouth 2 (two) times daily.     . Multiple Vitamins-Minerals (ICAPS AREDS FORMULA PO) Take 1 capsule by mouth 2 (two) times daily.     . Omega-3 Fatty Acids (FISH OIL) 1000 MG CAPS Take 1,000 mg by mouth daily.     Vladimir Faster Glycol-Propyl Glycol (SYSTANE) 0.4-0.3 % SOLN Place 2 drops into both eyes 2 (two) times daily.     Marland Kitchen terbinafine (LAMISIL) 250 MG tablet Take 250 mg by mouth daily.    . verapamil (CALAN-SR) 240 MG CR tablet Take 240 mg by mouth daily.     . vitamin B-12 (CYANOCOBALAMIN) 250 MCG tablet Take 250 mcg by mouth daily.     Marland Kitchen  warfarin (COUMADIN) 2.5 MG tablet Take 2.5-3.75 mg by mouth See admin instructions. Take 2.5mg  by mouth on Monday and Friday. Take 3.75mg  on Sunday, Tuesday, Wednesday, Thursday and Saturday.     No current facility-administered medications for this visit.    Allergies:   Caffeine and Other    Social History:  The patient  reports that she quit smoking about 48 years ago. Her smoking use included Cigarettes. She quit after 3 years of use. She has never used smokeless tobacco. She reports that she drinks alcohol. She reports that she does not use illicit drugs.   Family History:  The patient's family history includes Diabetes in her mother, sister, and another family member; Hypertension in her mother and another family member; Stroke in her mother.      ROS:  Please see the history of present illness.   Otherwise, review of systems are positive for none.   All other systems are reviewed and negative.    PHYSICAL EXAM: VS:  BP 127/83 mmHg  Pulse 82  Ht 5\' 5"  (1.651 m)  Wt 175 lb 6.4 oz (79.561 kg)  BMI 29.19 kg/m2 , BMI Body mass index is 29.19 kg/(m^2). GEN: Well nourished, well developed, in no acute distress HEENT: normal Neck: no JVD, carotid bruits, or masses Cardiac: RRR; no murmurs, rubs, or gallops,no edema  Respiratory:  clear to auscultation bilaterally, normal work of breathing GI: soft, nontender, nondistended, + BS MS: no deformity or atrophy Skin: warm and dry, no rash Neuro:  Strength and sensation are intact Psych: euthymic mood, full affect   EKG:  EKG is not ordered today. The ekg ordered today demonstrates    Recent Labs: 06/27/2014: BUN 12; Creatinine 0.71; Hemoglobin 15.0; Platelets 236; Potassium 4.5; Sodium 141    Lipid Panel    Component Value Date/Time   CHOL 173 02/06/2014 0851   TRIG 142.0 02/06/2014 0851   HDL 49.90 02/06/2014 0851   CHOLHDL 3 02/06/2014 0851   VLDL 28.4 02/06/2014 0851   LDLCALC 95 02/06/2014 0851      Wt Readings from Last 3 Encounters:  02/15/15 175 lb 6.4 oz (79.561 kg)  02/15/15 175 lb 6.4 oz (79.561 kg)  08/14/14 170 lb (77.111 kg)      Other studies Reviewed: Additional studies/ records that were reviewed today include: .    ASSESSMENT AND PLAN:  1. Chronic atrial fibrillation rate not adequately  Controlled about 25% of the time.   - continue Verapamil/metoprolol  - will increase metoprolol to  75mg  BID for better rate control - stop warfarin - start Eliquis 5mg  BID - check NOAC panel today 2. Chronic systemic anticoagulation 3. HTN - well controlled - Continue HCTZ/Verapamil/metoprolol  - check BMET 4. Tachybrady syndrome s/p PPM  Current medicines are reviewed at length with the patient today.  The patient does not have concerns regarding  medicines.  The following changes have been made:  Stop warfarin and start Eliquis 5mg  BID (will need to be seen in coumadin clinic for conversion), increase metoprolol to 75mg  BID  Labs/ tests ordered today include: BMET/CBC    Disposition:   FU with me in 6 months   Signed, Sueanne Margarita, MD  02/15/2015 3:15 PM    [facility] [place], [place], [place]  [address] Phone: [phone]; Fax: [phone]

## 2015-02-15 NOTE — Progress Notes (Signed)
Patient Care Team: Candace Wyline Copas, MD as PCP - General (Family Medicine) Jana Half, DPM as Consulting Physician (Podiatry)   HPI  Megan [name] is a 81 y.o. female Seen in followup for perm afib with bradycardia s/p pacer implantation 10/13-[employer]  The patient denies chest pain, nocturnal dyspnea, orthopnea or peripheral edema.  There have been no palpitations, lightheadedness or syncope.  She does have dyspnea on exertion.    Echocardiogram 10/13 demonstrated normal left ventricular function  Past Medical History  Diagnosis Date  . Pacemaker [employer]     10/13  . Blind   . GERD (gastroesophageal reflux disease)   . Dyslipidemia   . Current use of long term anticoagulation   . Hypertension   . Diverticulosis   . Vitamin D deficiency disease   . Permanent atrial fibrillation   . Tachycardia-bradycardia syndrome     s/p PPM  . Encounter for long-term (current) use of other medications   . Peripheral neuropathy     Past Surgical History  Procedure Laterality Date  . Cholecystectomy  2004  . Abdominal hysterectomy  1978  . Partial colectomy  2011    For diverticulosis  . Breast reduction surgery  1990  . Insert / replace / remove pacemaker  09/2012    Single chamber PM - Dr. Radford Pax  . Permanent pacemaker insertion Left 09/25/2012    Procedure: PERMANENT PACEMAKER INSERTION;  Surgeon: Deboraha Sprang, MD;  Location: [facility] CATH LAB;  Service: Cardiovascular;  Laterality: Left;    Current Outpatient Prescriptions  Medication Sig Dispense Refill  . acetaminophen (TYLENOL) 500 MG tablet Take 500 mg by mouth every 6 (six) hours as needed for moderate pain or headache.     . cholecalciferol (VITAMIN D) 1000 UNITS tablet Take 1,000 Units by mouth daily.    . Efinaconazole (JUBLIA) 10 % SOLN Apply 1 application topically 2 (two) times daily.    . fenofibrate 160 MG tablet Take 160 mg by mouth daily.    . hydrochlorothiazide (MICROZIDE) 12.5 MG capsule Take  12.5 mg by mouth daily.    . meclizine (ANTIVERT) 25 MG tablet Take 25 mg by mouth as needed for dizziness or nausea. Dizziness    . metoprolol (LOPRESSOR) 50 MG tablet Take 50 mg by mouth 2 (two) times daily.     . Multiple Vitamins-Minerals (ICAPS AREDS FORMULA PO) Take 1 capsule by mouth 2 (two) times daily.     . Omega-3 Fatty Acids (FISH OIL) 1000 MG CAPS Take 1,000 mg by mouth daily.     Vladimir Faster Glycol-Propyl Glycol (SYSTANE) 0.4-0.3 % SOLN Place 2 drops into both eyes 2 (two) times daily.     Marland Kitchen terbinafine (LAMISIL) 250 MG tablet Take 250 mg by mouth daily.    . verapamil (CALAN-SR) 240 MG CR tablet Take 240 mg by mouth daily.     . vitamin B-12 (CYANOCOBALAMIN) 250 MCG tablet Take 250 mcg by mouth daily.     Marland Kitchen warfarin (COUMADIN) 2.5 MG tablet Take 2.5-3.75 mg by mouth See admin instructions. Take 2.5mg  by mouth on Monday and Friday. Take 3.75mg  on Sunday, Tuesday, Wednesday, Thursday and Saturday.     No current facility-administered medications for this visit.    Allergies  Allergen Reactions  . Caffeine Palpitations  . Other Rash    "Preservatives"    Review of Systems negative except from HPI and PMH  Physical Exam BP 127/83 mmHg  Pulse 82  Ht 5'  5" (1.651 m)  Wt 175 lb 6.4 oz (79.561 kg)  BMI 29.19 kg/m2 Well developed and well nourished in no acute distress HENT normal E scleral and icterus clear Neck Supple JVP flat; carotids brisk and full Clear to ausculation Device pocket well healed; without hematoma or erythema.  There is no tethering Irregularlr irregular rate and rhythm, no murmurs gallops or rub Soft with active bowel sounds No clubbing cyanosis none Edema Alert and oriented, grossly normal motor and sensory function Skin Warm and Dry; skin tag above her device    Assessment and  Plan  Atrial fibrillation-permanent with rapid ventricular response  HFpEF  Pacemaker-[employer]  The patient's device was interrogated.  The information was  reviewed. No changes were made in the programming.     Heart rate excursion assessed by her device demonstrates that about 25% of her beats are faster than 100 bpm.I suggested that we increase her metoprolol from 50--100 mg twice daily. She is to see Dr. Radford Pax in just a few minutes and I will defer that decision TO her. In addition,We discussed the use of the NOACs compared to Coumadin. We briefly reviewed the data of at least comparability in stroke prevention, bleeding and outcome. We discussed some of the new once wherein somewhat associated with decreased ischemic stroke risk, one to be taken daily, and has been shown to be comparable and bleeding risk to aspirin.  We also discussed bleeding associated with warfarin as well as NOACs and a wall bleeding as a complication of all these drugs intracranial bleeding is more frequently associated with warfarin then the NOACs and a GI bleeding is more commonly associated with the latter  She would like to consider this and I would recommend that weUtilize apixaban 5 mg twice daily. I will defer this also to Dr. Radford Pax.

## 2015-02-16 LAB — CBC WITH DIFFERENTIAL/PLATELET
Basophils Absolute: 0 10*3/uL (ref 0.0–0.1)
Basophils Relative: 0.1 % (ref 0.0–3.0)
Eosinophils Absolute: 0.1 10*3/uL (ref 0.0–0.7)
Eosinophils Relative: 0.7 % (ref 0.0–5.0)
HEMATOCRIT: 46.7 % — AB (ref 36.0–46.0)
Hemoglobin: 15.9 g/dL — ABNORMAL HIGH (ref 12.0–15.0)
LYMPHS ABS: 2.5 10*3/uL (ref 0.7–4.0)
LYMPHS PCT: 31.5 % (ref 12.0–46.0)
MCHC: 34 g/dL (ref 30.0–36.0)
MCV: 94.9 fl (ref 78.0–100.0)
Monocytes Absolute: 0.6 10*3/uL (ref 0.1–1.0)
Monocytes Relative: 7.1 % (ref 3.0–12.0)
Neutro Abs: 4.8 10*3/uL (ref 1.4–7.7)
Neutrophils Relative %: 60.6 % (ref 43.0–77.0)
Platelets: 249 10*3/uL (ref 150.0–400.0)
RBC: 4.92 Mil/uL (ref 3.87–5.11)
RDW: 14.5 % (ref 11.5–15.5)
WBC: 7.9 10*3/uL (ref 4.0–10.5)

## 2015-02-16 LAB — BASIC METABOLIC PANEL
BUN: 24 mg/dL — ABNORMAL HIGH (ref 6–23)
CHLORIDE: 104 meq/L (ref 96–112)
CO2: 30 meq/L (ref 19–32)
CREATININE: 0.97 mg/dL (ref 0.40–1.20)
Calcium: 10.7 mg/dL — ABNORMAL HIGH (ref 8.4–10.5)
GFR: 71.18 mL/min (ref >60.00)
Glucose, Bld: 94 mg/dL (ref 70–99)
Potassium: 3.9 mEq/L (ref 3.5–5.1)
SODIUM: 139 meq/L (ref 135–145)

## 2015-02-16 LAB — PROTIME-INR
INR: 4 ratio — AB (ref 0.8–1.0)
Prothrombin Time: 42.4 s — ABNORMAL HIGH (ref 9.6–13.1)

## 2015-02-17 ENCOUNTER — Telehealth: Payer: Self-pay | Admitting: Cardiology

## 2015-02-17 ENCOUNTER — Other Ambulatory Visit: Payer: Self-pay

## 2015-02-17 DIAGNOSIS — Z1231 Encounter for screening mammogram for malignant neoplasm of breast: Secondary | ICD-10-CM

## 2015-02-17 NOTE — Telephone Encounter (Signed)
Spoke with pt.  INR was 4.0 on Monday.  She has been instructed to stop Coumadin today and start Eliquis on Friday.  This will give her 2 days without Coumadin to allow her INR to drift closer to 2.0.  Rx was sent to pharmacy on Monday and pt picked it up yesterday.  She will keep her planned Anticoag visit on 3/21 for her 1 month Eliquis follow up.

## 2015-02-17 NOTE — Telephone Encounter (Signed)
Patient informed of lab results and verbal understanding expressed.   Informed patient that Coumadin Clinic will call her ASAP to discuss transition to Eliquis from coumadin.

## 2015-02-17 NOTE — Telephone Encounter (Signed)
New MEssage  Pt requested to speak w/ Rn. Please call back and discuss.

## 2015-03-08 ENCOUNTER — Ambulatory Visit (INDEPENDENT_AMBULATORY_CARE_PROVIDER_SITE_OTHER): Payer: Medicare Other | Admitting: *Deleted

## 2015-03-08 ENCOUNTER — Encounter: Payer: Self-pay | Admitting: Internal Medicine

## 2015-03-08 DIAGNOSIS — I4891 Unspecified atrial fibrillation: Secondary | ICD-10-CM

## 2015-03-08 LAB — BASIC METABOLIC PANEL
BUN: 13 mg/dL (ref 6–23)
CHLORIDE: 106 meq/L (ref 96–112)
CO2: 28 meq/L (ref 19–32)
Calcium: 10.5 mg/dL (ref 8.4–10.5)
Creatinine, Ser: 0.78 mg/dL (ref 0.40–1.20)
GFR: 91.52 mL/min (ref >60.00)
Glucose, Bld: 91 mg/dL (ref 70–99)
POTASSIUM: 3.8 meq/L (ref 3.5–5.1)
Sodium: 140 mEq/L (ref 135–145)

## 2015-03-08 LAB — CBC
HCT: 45.2 % (ref 36.0–46.0)
Hemoglobin: 15 g/dL (ref 12.0–15.0)
MCHC: 33.2 g/dL (ref 30.0–36.0)
MCV: 94.8 fl (ref 78.0–100.0)
PLATELETS: 230 10*3/uL (ref 150.0–400.0)
RBC: 4.77 Mil/uL (ref 3.87–5.11)
RDW: 14.2 % (ref 11.5–15.5)
WBC: 6 10*3/uL (ref 4.0–10.5)

## 2015-03-09 NOTE — Progress Notes (Signed)
Pt was started on Eliquis for Afib on 02/16/15.   Reviewed patients medication list.  Pt is not  currently on any combined P-gp and strong CYP3A4 inhibitors/inducers (ketoconazole, traconazole, ritonavir, carbamazepine, phenytoin, rifampin, St. John's wort).  Reviewed labs.  SCr 0.78, Weight 79.5 Kg, Age 75yrs.  Dose  Appropriate  based on specified criteria.  Hgb and HCT 15/45.2.   A full discussion of the nature of anticoagulants has been carried out.  A benefit/risk analysis has been presented to the patient, so that they understand the justification for choosing anticoagulation with Eliquis at this time.  The need for compliance is stressed.  Pt is aware to take the medication twice daily.  Side effects of potential bleeding are discussed, including unusual colored urine or stools, coughing up blood or coffee ground emesis, nose bleeds or serious fall or head trauma.  Discussed signs and symptoms of stroke. The patient should avoid any OTC items containing aspirin or ibuprofen.  Avoid alcohol consumption.   Call if any signs of abnormal bleeding.  Discussed financial obligations and resolved any difficulty in obtaining medication.  Next lab test test in 6 months.

## 2015-03-09 NOTE — Patient Instructions (Addendum)
  A full discussion of the nature of anticoagulants has been carried out.  A benefit/risk analysis has been presented to the patient, so that they understand the justification for choosing anticoagulation with Eliquis at this time.  The need for compliance is stressed.  Pt is aware to take the medication twice daily.  Side effects of potential bleeding are discussed, including unusual colored urine or stools, coughing up blood or coffee ground emesis, nose bleeds or serious fall or head trauma.  Discussed signs and symptoms of stroke. The patient should avoid any OTC items containing aspirin or ibuprofen.  Avoid alcohol consumption.   Call if any signs of abnormal bleeding.  Call if any of these medications are started, ketoconazole, traconazole, ritonavir, carbamazepine, phenytoin, rifampin or St. John's wort.  Discussed financial obligations and resolved any difficulty in obtaining medication.  Next lab test test in 6 months.

## 2015-03-19 ENCOUNTER — Other Ambulatory Visit: Payer: Self-pay

## 2015-03-19 ENCOUNTER — Telehealth: Payer: Self-pay | Admitting: *Deleted

## 2015-03-19 MED ORDER — APIXABAN 5 MG PO TABS: 5.0000 mg | ORAL_TABLET | Freq: Two times a day (BID) | ORAL | Status: DC

## 2015-03-19 MED ORDER — METOPROLOL TARTRATE 50 MG PO TABS: 50.0000 mg | ORAL_TABLET | Freq: Two times a day (BID) | ORAL | Status: DC

## 2015-03-19 MED ORDER — METOPROLOL TARTRATE 25 MG PO TABS: 25.0000 mg | ORAL_TABLET | Freq: Two times a day (BID) | ORAL | Status: DC

## 2015-03-19 NOTE — Telephone Encounter (Signed)
Patient called and requested printed prescriptions for the eliquis [place] She would like a call when this has been taken care of, so that she can come by the office to pick them up. Thanks, MI

## 2015-03-22 ENCOUNTER — Other Ambulatory Visit: Payer: Self-pay | Admitting: *Deleted

## 2015-03-22 MED ORDER — METOPROLOL TARTRATE 50 MG PO TABS: 50.0000 mg | ORAL_TABLET | Freq: Two times a day (BID) | ORAL | Status: DC

## 2015-03-22 MED ORDER — APIXABAN 5 MG PO TABS: 5.0000 mg | ORAL_TABLET | Freq: Two times a day (BID) | ORAL | Status: DC

## 2015-03-22 MED ORDER — METOPROLOL TARTRATE 25 MG PO TABS: 25.0000 mg | ORAL_TABLET | Freq: Two times a day (BID) | ORAL | Status: DC

## 2015-03-29 NOTE — Telephone Encounter (Signed)
Filled 4/4.

## 2015-05-20 ENCOUNTER — Encounter: Payer: Self-pay | Admitting: Cardiology

## 2015-06-14 DIAGNOSIS — Z961 Presence of intraocular lens: Secondary | ICD-10-CM | POA: Diagnosis not present

## 2015-06-14 DIAGNOSIS — H3550 Unspecified hereditary retinal dystrophy: Secondary | ICD-10-CM | POA: Diagnosis not present

## 2015-06-14 DIAGNOSIS — H04123 Dry eye syndrome of bilateral lacrimal glands: Secondary | ICD-10-CM | POA: Diagnosis not present

## 2015-06-14 DIAGNOSIS — H01001 Unspecified blepharitis right upper eyelid: Secondary | ICD-10-CM | POA: Diagnosis not present

## 2015-07-13 DIAGNOSIS — I10 Essential (primary) hypertension: Secondary | ICD-10-CM | POA: Diagnosis not present

## 2015-07-13 DIAGNOSIS — Z950 Presence of cardiac pacemaker: Secondary | ICD-10-CM | POA: Diagnosis not present

## 2015-07-13 DIAGNOSIS — I4891 Unspecified atrial fibrillation: Secondary | ICD-10-CM | POA: Diagnosis not present

## 2015-07-13 DIAGNOSIS — E785 Hyperlipidemia, unspecified: Secondary | ICD-10-CM | POA: Diagnosis not present

## 2015-08-16 ENCOUNTER — Ambulatory Visit (INDEPENDENT_AMBULATORY_CARE_PROVIDER_SITE_OTHER): Payer: Medicare Other | Admitting: *Deleted

## 2015-08-16 DIAGNOSIS — I4821 Permanent atrial fibrillation: Secondary | ICD-10-CM

## 2015-08-16 DIAGNOSIS — I495 Sick sinus syndrome: Secondary | ICD-10-CM

## 2015-08-16 DIAGNOSIS — I482 Chronic atrial fibrillation: Secondary | ICD-10-CM | POA: Diagnosis not present

## 2015-08-16 LAB — CUP PACEART INCLINIC DEVICE CHECK
Battery Voltage: 2.98 V
Brady Statistic RV Percent Paced: 9.5 %
Date Time Interrogation Session: 20160829092031
Lead Channel Pacing Threshold Amplitude: 1.25 V
Lead Channel Pacing Threshold Pulse Width: 0.7 ms
Lead Channel Pacing Threshold Pulse Width: 0.7 ms
Lead Channel Sensing Intrinsic Amplitude: 10.9 mV
Lead Channel Setting Pacing Pulse Width: 0.7 ms
Lead Channel Setting Sensing Sensitivity: 2 mV
MDC IDC MSMT BATTERY REMAINING LONGEVITY: 145.2 mo
MDC IDC MSMT LEADCHNL RV IMPEDANCE VALUE: 450 Ohm
MDC IDC MSMT LEADCHNL RV PACING THRESHOLD AMPLITUDE: 1.25 V
MDC IDC SET LEADCHNL RV PACING AMPLITUDE: 2.5 V
Pulse Gen Model: 1110
Pulse Gen Serial Number: 7298578

## 2015-08-16 NOTE — Progress Notes (Signed)
Pacemaker check in clinic. Normal device function. Threshold, sensing, and impedance consistent with previous measurements. Device programmed to maximize longevity. Permanent AF + Eliquis. (1) high ventricular rate noted---AF/RVR. Device programmed at appropriate safety margins. Histogram distribution appropriate for patient activity level---HR >100 bpm---19.7% of the time. Device programmed to optimize intrinsic conduction. Estimated longevity 10.5-12.1 years. Patient will follow up with SK in 6 months.

## 2015-08-25 ENCOUNTER — Ambulatory Visit: Payer: Self-pay | Admitting: Cardiology

## 2015-08-25 DIAGNOSIS — Z5181 Encounter for therapeutic drug level monitoring: Secondary | ICD-10-CM

## 2015-08-25 DIAGNOSIS — I4821 Permanent atrial fibrillation: Secondary | ICD-10-CM

## 2015-09-06 ENCOUNTER — Ambulatory Visit (INDEPENDENT_AMBULATORY_CARE_PROVIDER_SITE_OTHER): Payer: Medicare Other | Admitting: *Deleted

## 2015-09-06 DIAGNOSIS — I4891 Unspecified atrial fibrillation: Secondary | ICD-10-CM

## 2015-09-06 LAB — BASIC METABOLIC PANEL
BUN: 17 mg/dL (ref 6–23)
CALCIUM: 10.4 mg/dL (ref 8.4–10.5)
CHLORIDE: 106 meq/L (ref 96–112)
CO2: 26 mEq/L (ref 19–32)
Creatinine, Ser: 0.7 mg/dL (ref 0.40–1.20)
GFR: 103.56 mL/min (ref >60.00)
Glucose, Bld: 91 mg/dL (ref 70–99)
POTASSIUM: 3.9 meq/L (ref 3.5–5.1)
SODIUM: 141 meq/L (ref 135–145)

## 2015-09-06 LAB — CBC
HEMATOCRIT: 44.3 % (ref 36.0–46.0)
Hemoglobin: 14.6 g/dL (ref 12.0–15.0)
MCHC: 33.1 g/dL (ref 30.0–36.0)
MCV: 95.9 fl (ref 78.0–100.0)
Platelets: 254 10*3/uL (ref 150.0–400.0)
RBC: 4.61 Mil/uL (ref 3.87–5.11)
RDW: 14.2 % (ref 11.5–15.5)
WBC: 6.6 10*3/uL (ref 4.0–10.5)

## 2015-09-06 NOTE — Progress Notes (Signed)
Pt was started on Eliquis 5mg  twice a day for Afib on 02/16/2015 by Dr. Fransico Him.    Reviewed patients medication list.  Pt is not currently on any combined P-gp and strong CYP3A4 inhibitors/inducers (ketoconazole, traconazole, ritonavir, carbamazepine, phenytoin, rifampin, St. John's wort).  Reviewed labs:  SCr 0.70 & Weight 79.7 kg.  Dose appropriate based on dosing criteria.   Hgb 14.6 and HCT 44.3.  A full discussion of the nature of anticoagulants has been carried out.  A benefit/risk analysis has been presented to the patient, so that they understand the justification for choosing anticoagulation with Eliquis at this time.  The need for compliance is stressed.  Pt is aware to take the medication twice daily.  Side effects of potential bleeding are discussed, including unusual colored urine or stools, coughing up blood or coffee ground emesis, nose bleeds or serious fall or head trauma.  Discussed signs and symptoms of stroke. The patient should avoid any OTC items containing aspirin or ibuprofen.  Avoid alcohol consumption.   Call if any signs of abnormal bleeding.  Discussed financial obligations and resolved any difficulty in obtaining medication.  Next lab test test in 6 months.   She had a tooth extraction 4 weeks ago and did not have to hold her Eliquis.  Spoke with patient and advised to continue taking Eliquis 5mg  twice a day and to call with any upcoming procedures or questions.

## 2015-09-08 ENCOUNTER — Encounter: Payer: Self-pay | Admitting: Cardiology

## 2015-09-08 ENCOUNTER — Ambulatory Visit (INDEPENDENT_AMBULATORY_CARE_PROVIDER_SITE_OTHER): Payer: Medicare Other | Admitting: Cardiology

## 2015-09-08 VITALS — BP 128/88 | HR 90 | Ht 65.0 in | Wt 177.4 lb

## 2015-09-08 DIAGNOSIS — Z950 Presence of cardiac pacemaker: Secondary | ICD-10-CM

## 2015-09-08 DIAGNOSIS — I495 Sick sinus syndrome: Secondary | ICD-10-CM

## 2015-09-08 DIAGNOSIS — I10 Essential (primary) hypertension: Secondary | ICD-10-CM

## 2015-09-08 DIAGNOSIS — I482 Chronic atrial fibrillation: Secondary | ICD-10-CM | POA: Diagnosis not present

## 2015-09-08 DIAGNOSIS — I4821 Permanent atrial fibrillation: Secondary | ICD-10-CM

## 2015-09-08 MED ORDER — APIXABAN 5 MG PO TABS: 5.0000 mg | ORAL_TABLET | Freq: Two times a day (BID) | ORAL | Status: DC

## 2015-09-08 MED ORDER — METOPROLOL TARTRATE 75 MG PO TABS: 75.0000 mg | ORAL_TABLET | Freq: Two times a day (BID) | ORAL | Status: DC

## 2015-09-08 NOTE — Progress Notes (Signed)
Cardiology Office Note   Date:  09/08/2015   ID:  Megan [name], DOB 08/09/1933, MRN [id number]  PCP:  Megan Naas, MD    Chief Complaint  Patient presents with  . Atrial Fibrillation      History of Present Illness: Megan [name] is a 81 y.o. female with a history of chronic atrial fibrillation, chronic systemic anticoagulation, tachybrady syndrome s/p PPM and HTN who presents today for followup. She is doing well. She denies any chest pain, SOB, DOE, dizziness, palpitations or syncope. She occasionally has some mild LE edema if she is standing on her feet for a long period of time.   Past Medical History  Diagnosis Date  . Pacemaker [employer]     10/13  . Blind   . GERD (gastroesophageal reflux disease)   . Dyslipidemia   . Current use of long term anticoagulation   . Hypertension   . Diverticulosis   . Vitamin D deficiency disease   . Permanent atrial fibrillation   . Tachycardia-bradycardia syndrome     s/p PPM  . Encounter for long-term (current) use of other medications   . Peripheral neuropathy     Past Surgical History  Procedure Laterality Date  . Cholecystectomy  2004  . Abdominal hysterectomy  1978  . Partial colectomy  2011    For diverticulosis  . Breast reduction surgery  1990  . Insert / replace / remove pacemaker  09/2012    Single chamber PM - Dr. Radford Pax  . Permanent pacemaker insertion Left 09/25/2012    Procedure: PERMANENT PACEMAKER INSERTION;  Surgeon: Deboraha Sprang, MD;  Location: [facility] CATH LAB;  Service: Cardiovascular;  Laterality: Left;     Current Outpatient Prescriptions  Medication Sig Dispense Refill  . acetaminophen (TYLENOL) 500 MG tablet Take 500 mg by mouth every 6 (six) hours as needed for moderate pain or headache.     Marland Kitchen apixaban (ELIQUIS) 5 MG TABS tablet Take 1 tablet (5 mg total) by mouth 2 (two) times daily. 180 tablet 2  . cholecalciferol (VITAMIN D) 1000 UNITS tablet Take 1,000 Units by mouth  daily.    . Efinaconazole (JUBLIA) 10 % SOLN Apply 1 application topically 2 (two) times daily.    . fenofibrate 160 MG tablet Take 160 mg by mouth daily.    . hydrochlorothiazide (MICROZIDE) 12.5 MG capsule Take 12.5 mg by mouth daily.    . meclizine (ANTIVERT) 25 MG tablet Take 25 mg by mouth as needed for dizziness or nausea. Dizziness    . metoprolol (LOPRESSOR) 50 MG tablet Take 1 tablet (50 mg total) by mouth 2 (two) times daily. (Patient taking differently: Take 75 mg by mouth 2 (two) times daily. ) 180 tablet 2  . Multiple Vitamins-Minerals (ICAPS AREDS FORMULA PO) Take 1 capsule by mouth 2 (two) times daily.     . Omega-3 Fatty Acids (FISH OIL) 1000 MG CAPS Take 1,000 mg by mouth daily.     Vladimir Faster Glycol-Propyl Glycol (SYSTANE) 0.4-0.3 % SOLN Place 2 drops into both eyes 2 (two) times daily.     Marland Kitchen terbinafine (LAMISIL) 250 MG tablet Take 250 mg by mouth daily.    . verapamil (CALAN-SR) 240 MG CR tablet Take 240 mg by mouth daily.     . vitamin B-12 (CYANOCOBALAMIN) 250 MCG tablet Take 250 mcg by mouth daily.      No current facility-administered medications for  this visit.    Allergies:   Caffeine and Other    Social History:  The patient  reports that she quit smoking about 48 years ago. Her smoking use included Cigarettes. She quit after 3 years of use. She has never used smokeless tobacco. She reports that she drinks alcohol. She reports that she does not use illicit drugs.   Family History:  The patient's family history includes Diabetes in her mother, sister, and another family member; Hypertension in her mother and another family member; Stroke in her mother.    ROS:  Please see the history of present illness.   Otherwise, review of systems are positive for none.   All other systems are reviewed and negative.    PHYSICAL EXAM: VS:  BP 110/72 mmHg  Pulse 104  Ht 5\' 5"  (1.651 m)  Wt 177 lb 6.4 oz (80.468 kg)  BMI 29.52 kg/m2 , BMI Body mass index is 29.52  kg/(m^2). GEN: Well nourished, well developed, in no acute distress HEENT: normal Neck: no JVD, carotid bruits, or masses Cardiac: RRR; no murmurs, rubs, or gallops,no edema  Respiratory:  clear to auscultation bilaterally, normal work of breathing GI: soft, nontender, nondistended, + BS MS: no deformity or atrophy Skin: warm and dry, no rash Neuro:  Strength and sensation are intact Psych: euthymic mood, full affect   EKG:  EKG is ordered today. The ekg ordered today demonstrates atrial fibrillation with RVR at 104bpm and nonspecific ST abnormality   Recent Labs: 09/06/2015: BUN 17; Creatinine, Ser 0.70; Hemoglobin 14.6; Platelets 254.0; Potassium 3.9; Sodium 141    Lipid Panel    Component Value Date/Time   CHOL 173 02/06/2014 0851   TRIG 142.0 02/06/2014 0851   HDL 49.90 02/06/2014 0851   CHOLHDL 3 02/06/2014 0851   VLDL 28.4 02/06/2014 0851   LDLCALC 95 02/06/2014 0851      Wt Readings from Last 3 Encounters:  09/08/15 177 lb 6.4 oz (80.468 kg)  02/15/15 175 lb 6.4 oz (79.561 kg)  02/15/15 175 lb 6.4 oz (79.561 kg)    ASSESSMENT AND PLAN:  1. Chronic atrial fibrillation rate elevated on arrival but after resting it was 90bpm   - continue Verapamil/metoprolol/Eliquis 2. Chronic systemic anticoagulation 3. HTN - well controlled - Continue HCTZ/Verapamil/metoprolol  4. Tachybrady syndrome s/p PPM   Current medicines are reviewed at length with the patient today.  The patient does not have concerns regarding medicines.  The following changes have been made:  no change  Labs/ tests ordered today: See above Assessment and Plan No orders of the defined types were placed in this encounter.     Disposition:   FU with me in 6 months  Signed, Megan Margarita, MD  09/08/2015 3:16 PM    [facility] [address] [place], [place], [place]  [address] Phone: [phone]; Fax: [phone]

## 2015-09-08 NOTE — Patient Instructions (Signed)

## 2015-09-08 NOTE — Addendum Note (Signed)
Addended by: Harland German A on: 09/08/2015 03:45 PM   Modules accepted: Orders

## 2015-09-09 ENCOUNTER — Encounter: Payer: Self-pay | Admitting: Internal Medicine

## 2015-09-09 ENCOUNTER — Encounter: Payer: Self-pay | Admitting: Cardiology

## 2015-09-21 ENCOUNTER — Ambulatory Visit
Admission: RE | Admit: 2015-09-21 | Discharge: 2015-09-21 | Disposition: A | Discharge status: Home / Self Care | Payer: Medicare Other | Source: Ambulatory Visit

## 2015-09-21 DIAGNOSIS — Z1231 Encounter for screening mammogram for malignant neoplasm of breast: Secondary | ICD-10-CM

## 2015-09-29 DIAGNOSIS — Z23 Encounter for immunization: Secondary | ICD-10-CM | POA: Diagnosis not present

## 2015-11-16 DIAGNOSIS — I4891 Unspecified atrial fibrillation: Secondary | ICD-10-CM | POA: Diagnosis not present

## 2015-11-16 DIAGNOSIS — I119 Hypertensive heart disease without heart failure: Secondary | ICD-10-CM | POA: Diagnosis not present

## 2015-11-16 DIAGNOSIS — Z950 Presence of cardiac pacemaker: Secondary | ICD-10-CM | POA: Diagnosis not present

## 2015-11-16 DIAGNOSIS — J209 Acute bronchitis, unspecified: Secondary | ICD-10-CM | POA: Diagnosis not present

## 2016-01-17 DIAGNOSIS — I119 Hypertensive heart disease without heart failure: Secondary | ICD-10-CM | POA: Diagnosis not present

## 2016-01-17 DIAGNOSIS — I4891 Unspecified atrial fibrillation: Secondary | ICD-10-CM | POA: Diagnosis not present

## 2016-01-17 DIAGNOSIS — E785 Hyperlipidemia, unspecified: Secondary | ICD-10-CM | POA: Diagnosis not present

## 2016-01-17 DIAGNOSIS — I10 Essential (primary) hypertension: Secondary | ICD-10-CM | POA: Diagnosis not present

## 2016-02-16 ENCOUNTER — Encounter: Payer: Self-pay | Admitting: *Deleted

## 2016-03-06 ENCOUNTER — Ambulatory Visit (INDEPENDENT_AMBULATORY_CARE_PROVIDER_SITE_OTHER): Payer: Medicare Other | Admitting: Pharmacist

## 2016-03-06 DIAGNOSIS — I4891 Unspecified atrial fibrillation: Secondary | ICD-10-CM | POA: Diagnosis not present

## 2016-03-06 LAB — CBC
HCT: 43 % (ref 36.0–46.0)
HEMOGLOBIN: 14.5 g/dL (ref 12.0–15.0)
MCH: 31 pg (ref 26.0–34.0)
MCHC: 33.7 g/dL (ref 30.0–36.0)
MCV: 92.1 fL (ref 78.0–100.0)
MPV: 10.2 fL (ref 8.6–12.4)
Platelets: 297 10*3/uL (ref 150–400)
RBC: 4.67 MIL/uL (ref 3.87–5.11)
RDW: 14.5 % (ref 11.5–15.5)
WBC: 6.9 10*3/uL (ref 4.0–10.5)

## 2016-03-06 LAB — BASIC METABOLIC PANEL
BUN: 14 mg/dL (ref 7–25)
CHLORIDE: 104 mmol/L (ref 98–110)
CO2: 22 mmol/L (ref 20–31)
CREATININE: 0.75 mg/dL (ref 0.60–0.88)
Calcium: 10.1 mg/dL (ref 8.6–10.4)
Glucose, Bld: 100 mg/dL — ABNORMAL HIGH (ref 65–99)
Potassium: 4.2 mmol/L (ref 3.5–5.3)
SODIUM: 139 mmol/L (ref 135–146)

## 2016-03-06 NOTE — Progress Notes (Signed)
Pt was started on Eliquis 5mg  twice a day for Afib on 02/16/2015 by Dr. Fransico Him.    Reviewed patients medication list.  Pt is not currently on any combined P-gp and strong CYP3A4 inhibitors/inducers (ketoconazole, traconazole, ritonavir, carbamazepine, phenytoin, rifampin, St. John's wort).  Reviewed labs:  SCr 0.75 mg/dL & Weight 79.7 kg.  Dose appropriate based on dosing criteria.   Hgb 14.5and HCT 43.0.  A full discussion of the nature of anticoagulants has been carried out.  A benefit/risk analysis has been presented to the patient, so that they understand the justification for choosing anticoagulation with Eliquis at this time.  The need for compliance is stressed.  Pt is aware to take the medication twice daily.  Side effects of potential bleeding are discussed, including unusual colored urine or stools, coughing up blood or coffee ground emesis, nose bleeds or serious fall or head trauma.  Discussed signs and symptoms of stroke. The patient should avoid any OTC items containing aspirin or ibuprofen.  Avoid alcohol consumption.   Call if any signs of abnormal bleeding.  Discussed financial obligations and resolved any difficulty in obtaining medication.  Pt gets her mediations from the [employer] base.  Given labs stable, will have her follow up with Dr. Radford Pax.

## 2016-03-15 ENCOUNTER — Ambulatory Visit: Payer: Medicare Other | Admitting: Cardiology

## 2016-04-04 DIAGNOSIS — Z01419 Encounter for gynecological examination (general) (routine) without abnormal findings: Secondary | ICD-10-CM | POA: Diagnosis not present

## 2016-04-18 ENCOUNTER — Encounter: Payer: Self-pay | Admitting: Nurse Practitioner

## 2016-04-18 NOTE — Progress Notes (Signed)
Electrophysiology Office Note Date: 04/19/2016  ID:  Megan [name], Megan [name] 01-24-1933, MRN [id number]  PCP: Reginia Naas, MD Primary Cardiologist: Radford Pax Electrophysiologist: Caryl Comes  CC: Pacemaker follow-up  Megan [name] is a 82 y.o. female seen today for Dr Caryl Comes.  She presents today for routine electrophysiology followup.  Since last being seen in our clinic, the patient reports doing very well. She is walking a mile every other day. She has stable dyspnea on exertion and dependent edema.  She denies chest pain, palpitations, PND, orthopnea, nausea, vomiting, dizziness, syncope, weight gain, or early satiety.  Device History: STJ single chamber PPM implanted 2013 for tachy/brady   Past Medical History  Diagnosis Date  . Blind   . GERD (gastroesophageal reflux disease)   . Dyslipidemia   . Hypertension   . Diverticulosis   . Vitamin D deficiency disease   . Permanent atrial fibrillation ([place])   . Tachycardia-bradycardia syndrome ([place])     a. s/p STJ PPM   . Peripheral neuropathy ([place])    Past Surgical History  Procedure Laterality Date  . Cholecystectomy  2004  . Abdominal hysterectomy  1978  . Partial colectomy  2011    For diverticulosis  . Breast reduction surgery  1990  . Permanent pacemaker insertion Left 09/25/2012    Procedure: PERMANENT PACEMAKER INSERTION;  Surgeon: Deboraha Sprang, MD;  Location: [facility] CATH LAB;  Service: Cardiovascular;  Laterality: Left;    Current Outpatient Prescriptions  Medication Sig Dispense Refill  . acetaminophen (TYLENOL) 500 MG tablet Take 500 mg by mouth every 6 (six) hours as needed for moderate pain or headache.     . cholecalciferol (VITAMIN D) 1000 UNITS tablet Take 1,000 Units by mouth daily.    . Efinaconazole (JUBLIA) 10 % SOLN Apply 1 application topically 2 (two) times daily.    Marland Kitchen ELIQUIS 2.5 MG TABS tablet Take 1 tablet by mouth 2 (two) times daily.    . fenofibrate 160 MG tablet Take 160 mg by mouth daily.    .  hydrochlorothiazide (MICROZIDE) 12.5 MG capsule Take 12.5 mg by mouth daily.    . meclizine (ANTIVERT) 25 MG tablet Take 25 mg by mouth as needed for dizziness or nausea. Dizziness    . metoprolol (LOPRESSOR) 50 MG tablet Take 1 tablet by mouth 2 (two) times daily. Take with 25mg  tablet to equal 75mg  twice daily    . metoprolol tartrate (LOPRESSOR) 25 MG tablet Take 1 tablet by mouth 2 (two) times daily. Take with 50mg  tablet to equal 75mg  total twice daily    . Multiple Vitamins-Minerals (ICAPS AREDS FORMULA PO) Take 1 capsule by mouth 2 (two) times daily.     . Omega-3 Fatty Acids (FISH OIL) 1000 MG CAPS Take 1,000 mg by mouth daily.     Vladimir Faster Glycol-Propyl Glycol (SYSTANE) 0.4-0.3 % SOLN Place 2 drops into both eyes 2 (two) times daily.     Marland Kitchen terbinafine (LAMISIL) 250 MG tablet Take 250 mg by mouth daily.    . verapamil (CALAN-SR) 240 MG CR tablet Take 240 mg by mouth daily.     . vitamin B-12 (CYANOCOBALAMIN) 250 MCG tablet Take 250 mcg by mouth daily.      No current facility-administered medications for this visit.    Allergies:   Caffeine and Other   Social History: Social History   Social History  . Marital Status: Married    Spouse Name: N/A  . Number of Children: N/A  . Years of Education: N/A  Occupational History  . Not on file.   Social History Main Topics  . Smoking status: Former Smoker -- 3 years    Types: Cigarettes    Quit date: 12/18/1966  . Smokeless tobacco: Never Used  . Alcohol Use: Yes     Comment: occassional glass of wine  . Drug Use: No  . Sexual Activity: Not on file   Other Topics Concern  . Not on file   Social History Narrative   Lives with spouse in [place], Retired from [employer] History: Family History  Problem Relation Age of Onset  . Hypertension    . Diabetes      3 half sisters with DM, one deseased due to DM complications  . Hypertension Mother   . Diabetes Mother   . Stroke Mother   . Diabetes Sister       Review of Systems: All other systems reviewed and are otherwise negative except as noted above.   Physical Exam: VS:  BP 118/80 mmHg  Pulse 72  Ht 5\' 4"  (1.626 m)  Wt 173 lb (78.472 kg)  BMI 29.68 kg/m2 , BMI Body mass index is 29.68 kg/(m^2).  GEN- The patient is elderly appearing, alert and oriented x 3 today, legally blind  HEENT: normocephalic, atraumatic; sclera clear, conjunctiva pink; hearing intact; oropharynx clear; neck supple  Lungs- Clear to ausculation bilaterally, normal work of breathing.  No wheezes, rales, rhonchi Heart- Irregular rate and rhythm  GI- soft, non-tender, non-distended, bowel sounds present  Extremities- no clubbing, cyanosis, + dependent edema MS- no significant deformity or atrophy Skin- warm and dry, no rash or lesion; PPM pocket well healed Psych- euthymic mood, full affect Neuro- strength and sensation are intact  PPM Interrogation- reviewed in detail today,  See PACEART report  EKG:  EKG is not ordered today.  Recent Labs: 03/06/2016: BUN 14; Creat 0.75; Hemoglobin 14.5; Platelets 297; Potassium 4.2; Sodium 139   Wt Readings from Last 3 Encounters:  04/19/16 173 lb (78.472 kg)  04/19/16 173 lb 12.8 oz (78.835 kg)  09/08/15 177 lb 6.4 oz (80.468 kg)     Other studies Reviewed: Additional studies/ records that were reviewed today include: Dr Radford Pax and Dr Olin Pia office notes  Assessment and Plan:  1.  Tachy/brady syndrome Normal PPM function See Pace Art report No changes today Pt declines remote monitoring  2.  Permanent atrial fibrillation Continue Eliquis for CHADS2VASC of 4 Recent BMET, CBC stable  3.  HTN Stable No change required today   Current medicines are reviewed at length with the patient today.   The patient does not have concerns regarding her medicines.  The following changes were made today:  none  Labs/ tests ordered today include: none  Disposition:   Follow up with Dr Radford Pax as scheduled, Dr  Caryl Comes 1 year     Signed, Chanetta Marshall, NP 04/19/2016 8:44 AM  [facility] [address] [place] [address] [phone] (office) [phone] (fax)

## 2016-04-19 ENCOUNTER — Encounter: Payer: Self-pay | Admitting: Internal Medicine

## 2016-04-19 ENCOUNTER — Ambulatory Visit (INDEPENDENT_AMBULATORY_CARE_PROVIDER_SITE_OTHER): Payer: Medicare Other | Admitting: Cardiology

## 2016-04-19 ENCOUNTER — Ambulatory Visit (INDEPENDENT_AMBULATORY_CARE_PROVIDER_SITE_OTHER): Payer: Medicare Other | Admitting: Nurse Practitioner

## 2016-04-19 ENCOUNTER — Encounter: Payer: Self-pay | Admitting: Cardiology

## 2016-04-19 ENCOUNTER — Encounter: Payer: Self-pay | Admitting: Nurse Practitioner

## 2016-04-19 VITALS — BP 118/80 | HR 72 | Ht 64.0 in | Wt 173.8 lb

## 2016-04-19 VITALS — BP 118/80 | HR 72 | Ht 64.0 in | Wt 173.0 lb

## 2016-04-19 DIAGNOSIS — I10 Essential (primary) hypertension: Secondary | ICD-10-CM | POA: Diagnosis not present

## 2016-04-19 DIAGNOSIS — I482 Chronic atrial fibrillation: Secondary | ICD-10-CM | POA: Diagnosis not present

## 2016-04-19 DIAGNOSIS — I4821 Permanent atrial fibrillation: Secondary | ICD-10-CM

## 2016-04-19 DIAGNOSIS — I495 Sick sinus syndrome: Secondary | ICD-10-CM

## 2016-04-19 LAB — CUP PACEART INCLINIC DEVICE CHECK
Lead Channel Impedance Value: 450 Ohm
Lead Channel Pacing Threshold Pulse Width: 0.7 ms
Lead Channel Sensing Intrinsic Amplitude: 10.9 mV
Lead Channel Setting Pacing Pulse Width: 0.7 ms
Lead Channel Setting Sensing Sensitivity: 2 mV
MDC IDC LEAD IMPLANT DT: 20131009
MDC IDC LEAD LOCATION: 753860
MDC IDC MSMT BATTERY VOLTAGE: 2.96 V
MDC IDC MSMT LEADCHNL RV PACING THRESHOLD AMPLITUDE: 1.25 V
MDC IDC SESS DTM: 20170503084336
MDC IDC SET LEADCHNL RV PACING AMPLITUDE: 2.5 V
MDC IDC STAT BRADY RV PERCENT PACED: 5.3 %
Pulse Gen Model: 1110
Pulse Gen Serial Number: 7298578

## 2016-04-19 NOTE — Patient Instructions (Signed)

## 2016-04-19 NOTE — Patient Instructions (Signed)
Medication Instructions:   Your physician recommends that you continue on your current medications as directed. Please refer to the Current Medication list given to you today.   If you need a refill on your cardiac medications before your next appointment, please call your pharmacy.  Labwork:  NONE ORDER TODAY\   Testing/Procedures:  NONE ORDER TODAY    Follow-Up: 6 MONTHS WITH DEVICE CHECK ON DAY DR TURNER FOLLOW UP  VISIT.Marland Kitchen  Your physician wants you to follow-up in: [place].Marland KitchenYou will receive a reminder letter in the mail two months in advance. If you don't receive a letter, please call our office to schedule the follow-up appointment.     Any Other Special Instructions Will Be Listed Below (If Applicable).

## 2016-04-19 NOTE — Progress Notes (Signed)
Cardiology Office Note    Date:  04/19/2016   ID:  [name], DOB 10/17/1933, MRN [id number]  PCP:  Reginia Naas, MD  Cardiologist:  Sueanne Margarita, MD   Chief Complaint  Patient presents with  . Atrial Fibrillation  . Hypertension    History of Present Illness:  Megan [name] is a 82 y.o. female with a history of chronic atrial fibrillation, chronic systemic anticoagulation, tachybrady syndrome s/p PPM and HTN who presents today for followup. She is doing well. She denies any chest pain, SOB, DOE, dizziness, palpitations or syncope. She occasionally has some mild LE edema if she is standing on her feet for a long period of time.  She walks a mile 3 times weekly.       Past Medical History  Diagnosis Date  . Blind   . GERD (gastroesophageal reflux disease)   . Dyslipidemia   . Hypertension   . Diverticulosis   . Vitamin D deficiency disease   . Permanent atrial fibrillation ([name])   . Tachycardia-bradycardia syndrome ([name])     a. s/p STJ PPM   . Peripheral neuropathy ([name])     Past Surgical History  Procedure Laterality Date  . Cholecystectomy  2004  . Abdominal hysterectomy  1978  . Partial colectomy  2011    For diverticulosis  . Breast reduction surgery  1990  . Permanent pacemaker insertion Left 09/25/2012    Procedure: PERMANENT PACEMAKER INSERTION;  Surgeon: Deboraha Sprang, MD;  Location: [facility] CATH LAB;  Service: Cardiovascular;  Laterality: Left;    Current Medications: Outpatient Prescriptions Prior to Visit  Medication Sig Dispense Refill  . acetaminophen (TYLENOL) 500 MG tablet Take 500 mg by mouth every 6 (six) hours as needed for moderate pain or headache.     . cholecalciferol (VITAMIN D) 1000 UNITS tablet Take 1,000 Units by mouth daily.    . Efinaconazole (JUBLIA) 10 % SOLN Apply 1 application topically 2 (two) times daily.    . fenofibrate 160 MG tablet Take 160 mg by mouth daily.    . hydrochlorothiazide (MICROZIDE) 12.5 MG capsule  Take 12.5 mg by mouth daily.    . meclizine (ANTIVERT) 25 MG tablet Take 25 mg by mouth as needed for dizziness or nausea. Dizziness    . Multiple Vitamins-Minerals (ICAPS AREDS FORMULA PO) Take 1 capsule by mouth 2 (two) times daily.     . Omega-3 Fatty Acids (FISH OIL) 1000 MG CAPS Take 1,000 mg by mouth daily.     Vladimir Faster Glycol-Propyl Glycol (SYSTANE) 0.4-0.3 % SOLN Place 2 drops into both eyes 2 (two) times daily.     Marland Kitchen terbinafine (LAMISIL) 250 MG tablet Take 250 mg by mouth daily.    . verapamil (CALAN-SR) 240 MG CR tablet Take 240 mg by mouth daily.     . vitamin B-12 (CYANOCOBALAMIN) 250 MCG tablet Take 250 mcg by mouth daily.     Marland Kitchen apixaban (ELIQUIS) 5 MG TABS tablet Take 1 tablet (5 mg total) by mouth 2 (two) times daily. 180 tablet 3  . metoprolol 75 MG TABS Take 75 mg by mouth 2 (two) times daily. 180 tablet 3   No facility-administered medications prior to visit.     Allergies:   Caffeine and Other   Social History   Social History  . Marital Status: Married    Spouse Name: N/A  . Number of Children: N/A  . Years of Education: N/A   Social History Main Topics  . Smoking status:  Former Smoker -- 3 years    Types: Cigarettes    Quit date: 12/18/1966  . Smokeless tobacco: Never Used  . Alcohol Use: Yes     Comment: occassional glass of wine  . Drug Use: No  . Sexual Activity: Not Asked   Other Topics Concern  . None   Social History Narrative   Lives with spouse in [employer], Retired from [employer] History:  The patient's family history includes Diabetes in her mother and sister; Hypertension in her mother; Stroke in her mother.   ROS:   Please see the history of present illness.    ROS All other systems reviewed and are negative.   PHYSICAL EXAM:   VS:  BP 118/80 mmHg  Pulse 72  Ht 5\' 4"  (1.626 m)  Wt 173 lb 12.8 oz (78.835 kg)  BMI 29.82 kg/m2   GEN: Well nourished, well developed, in no acute distress HEENT: normal Neck: no JVD,  carotid bruits, or masses Cardiac: RRR; no murmurs, rubs, or gallops,no edema.  Intact distal pulses bilaterally.  Respiratory:  clear to auscultation bilaterally, normal work of breathing GI: soft, nontender, nondistended, + BS MS: no deformity or atrophy Skin: warm and dry, no rash Neuro:  Alert and Oriented x 3, Strength and sensation are intact Psych: euthymic mood, full affect  Wt Readings from Last 3 Encounters:  04/19/16 173 lb 12.8 oz (78.835 kg)  09/08/15 177 lb 6.4 oz (80.468 kg)  02/15/15 175 lb 6.4 oz (79.561 kg)      Studies/Labs Reviewed:   EKG:  EKG is not ordered today.    Recent Labs: 03/06/2016: BUN 14; Creat 0.75; Hemoglobin 14.5; Platelets 297; Potassium 4.2; Sodium 139   Lipid Panel    Component Value Date/Time   CHOL 173 02/06/2014 0851   TRIG 142.0 02/06/2014 0851   HDL 49.90 02/06/2014 0851   CHOLHDL 3 02/06/2014 0851   VLDL 28.4 02/06/2014 0851   LDLCALC 95 02/06/2014 0851    Additional studies/ records that were reviewed today include:  none    ASSESSMENT:    1. Permanent atrial fibrillation ([name])   2. Tachycardia-bradycardia syndrome ([name])   3. Benign essential HTN      PLAN:  In order of problems listed above:  1. Permanent atrial fibrillation - rate controlled on BB and CCB.  Continue Eliquis.  2. Tachybrady s/p PPM - she will have her device check today. 3.   HTN - BP well controlled.  Continue BB/CCB/diuretic.  Followup with me in 6 months  Medication Adjustments/Labs and Tests Ordered: Current medicines are reviewed at length with the patient today.  Concerns regarding medicines are outlined above.  Medication changes, Labs and Tests ordered today are listed in the Patient Instructions below.   Megan Nida, MD  04/19/2016 8:06 AM    [facility] [place], [place], [place]  [address] Phone: [phone]; Fax: [phone]

## 2016-07-24 DIAGNOSIS — E559 Vitamin D deficiency, unspecified: Secondary | ICD-10-CM | POA: Diagnosis not present

## 2016-07-24 DIAGNOSIS — Z1389 Encounter for screening for other disorder: Secondary | ICD-10-CM | POA: Diagnosis not present

## 2016-07-24 DIAGNOSIS — M2540 Effusion, unspecified joint: Secondary | ICD-10-CM | POA: Diagnosis not present

## 2016-07-24 DIAGNOSIS — I10 Essential (primary) hypertension: Secondary | ICD-10-CM | POA: Diagnosis not present

## 2016-07-24 DIAGNOSIS — I4891 Unspecified atrial fibrillation: Secondary | ICD-10-CM | POA: Diagnosis not present

## 2016-07-24 DIAGNOSIS — Z0001 Encounter for general adult medical examination with abnormal findings: Secondary | ICD-10-CM | POA: Diagnosis not present

## 2016-07-24 DIAGNOSIS — E785 Hyperlipidemia, unspecified: Secondary | ICD-10-CM | POA: Diagnosis not present

## 2016-07-24 DIAGNOSIS — H548 Legal blindness, as defined in USA: Secondary | ICD-10-CM | POA: Diagnosis not present

## 2016-08-11 DIAGNOSIS — H04123 Dry eye syndrome of bilateral lacrimal glands: Secondary | ICD-10-CM | POA: Diagnosis not present

## 2016-08-11 DIAGNOSIS — Z961 Presence of intraocular lens: Secondary | ICD-10-CM | POA: Diagnosis not present

## 2016-08-11 DIAGNOSIS — H01001 Unspecified blepharitis right upper eyelid: Secondary | ICD-10-CM | POA: Diagnosis not present

## 2016-08-11 DIAGNOSIS — H3550 Unspecified hereditary retinal dystrophy: Secondary | ICD-10-CM | POA: Diagnosis not present

## 2016-09-20 ENCOUNTER — Other Ambulatory Visit: Payer: Self-pay | Admitting: Family Medicine

## 2016-09-20 DIAGNOSIS — Z1231 Encounter for screening mammogram for malignant neoplasm of breast: Secondary | ICD-10-CM

## 2016-10-05 ENCOUNTER — Ambulatory Visit
Admission: RE | Admit: 2016-10-05 | Discharge: 2016-10-05 | Disposition: A | Discharge status: Home / Self Care | Payer: Medicare Other | Source: Ambulatory Visit | Attending: Family Medicine | Admitting: Family Medicine

## 2016-10-05 ENCOUNTER — Encounter: Payer: Self-pay | Admitting: Cardiology

## 2016-10-05 DIAGNOSIS — Z1231 Encounter for screening mammogram for malignant neoplasm of breast: Secondary | ICD-10-CM | POA: Diagnosis not present

## 2016-10-18 ENCOUNTER — Encounter: Payer: Self-pay | Admitting: Cardiology

## 2016-10-18 ENCOUNTER — Ambulatory Visit (INDEPENDENT_AMBULATORY_CARE_PROVIDER_SITE_OTHER): Payer: Medicare Other | Admitting: Cardiology

## 2016-10-18 VITALS — BP 128/80 | HR 87 | Ht 65.0 in | Wt 178.0 lb

## 2016-10-18 DIAGNOSIS — I482 Chronic atrial fibrillation: Secondary | ICD-10-CM | POA: Diagnosis not present

## 2016-10-18 DIAGNOSIS — I495 Sick sinus syndrome: Secondary | ICD-10-CM | POA: Diagnosis not present

## 2016-10-18 DIAGNOSIS — I10 Essential (primary) hypertension: Secondary | ICD-10-CM | POA: Diagnosis not present

## 2016-10-18 DIAGNOSIS — I4821 Permanent atrial fibrillation: Secondary | ICD-10-CM

## 2016-10-18 LAB — CBC WITH DIFFERENTIAL/PLATELET
Basophils Absolute: 0 cells/uL (ref 0–200)
Basophils Relative: 0 %
EOS ABS: 72 {cells}/uL (ref 15–500)
Eosinophils Relative: 1 %
HEMATOCRIT: 42.8 % (ref 35.0–45.0)
Hemoglobin: 14.4 g/dL (ref 11.7–15.5)
LYMPHS PCT: 35 %
Lymphs Abs: 2520 cells/uL (ref 850–3900)
MCH: 31 pg (ref 27.0–33.0)
MCHC: 33.6 g/dL (ref 32.0–36.0)
MCV: 92 fL (ref 80.0–100.0)
MONO ABS: 864 {cells}/uL (ref 200–950)
MPV: 10.1 fL (ref 7.5–12.5)
Monocytes Relative: 12 %
NEUTROS PCT: 52 %
Neutro Abs: 3744 cells/uL (ref 1500–7800)
Platelets: 295 10*3/uL (ref 140–400)
RBC: 4.65 MIL/uL (ref 3.80–5.10)
RDW: 13.9 % (ref 11.0–15.0)
WBC: 7.2 10*3/uL (ref 3.8–10.8)

## 2016-10-18 LAB — BASIC METABOLIC PANEL
BUN: 9 mg/dL (ref 7–25)
CO2: 25 mmol/L (ref 20–31)
Calcium: 10 mg/dL (ref 8.6–10.4)
Chloride: 105 mmol/L (ref 98–110)
Creat: 0.69 mg/dL (ref 0.60–0.88)
GLUCOSE: 80 mg/dL (ref 65–99)
POTASSIUM: 3.7 mmol/L (ref 3.5–5.3)
Sodium: 140 mmol/L (ref 135–146)

## 2016-10-18 NOTE — Progress Notes (Signed)
Cardiology Office Note    Date:  10/18/2016   ID:  Megan [name], DOB Nov 28, 1933, MRN [id number]  PCP:  Reginia Naas, MD  Cardiologist:  Fransico Him, MD   Chief Complaint  Patient presents with  . Atrial Fibrillation  . Hypertension    History of Present Illness:  Megan [name] is a 82 y.o. female with a history of chronic atrial fibrillation, chronic systemic anticoagulation, tachybrady syndrome s/p PPM and HTN who presents today for followup. She is doing well. She denies any chest pain, SOB, DOE, dizziness, LE edema, palpitations or syncope.   Past Medical History:  Diagnosis Date  . Blind   . Diverticulosis   . Dyslipidemia   . GERD (gastroesophageal reflux disease)   . Hypertension   . Peripheral neuropathy ([place])   . Permanent atrial fibrillation ([place])   . Tachycardia-bradycardia syndrome ([place])    a. s/p STJ PPM   . Vitamin D deficiency disease     Past Surgical History:  Procedure Laterality Date  . ABDOMINAL HYSTERECTOMY  1978  . BREAST REDUCTION SURGERY  1990  . CHOLECYSTECTOMY  2004  . PARTIAL COLECTOMY  2011   For diverticulosis  . PERMANENT PACEMAKER INSERTION Left 09/25/2012   Procedure: PERMANENT PACEMAKER INSERTION;  Surgeon: Deboraha Sprang, MD;  Location[facility] CATH LAB;  Service: Cardiovascular;  Laterality: Left;    Current Medications: Outpatient Medications Prior to Visit  Medication Sig Dispense Refill  . acetaminophen (TYLENOL) 500 MG tablet Take 500 mg by mouth every 6 (six) hours as needed for moderate pain or headache.     . cholecalciferol (VITAMIN D) 1000 UNITS tablet Take 1,000 Units by mouth daily.    . Efinaconazole (JUBLIA) 10 % SOLN Apply 1 application topically 2 (two) times daily.    Marland Kitchen ELIQUIS 2.5 MG TABS tablet Take 1 tablet by mouth 2 (two) times daily.    . fenofibrate 160 MG tablet Take 160 mg by mouth daily.    . hydrochlorothiazide (MICROZIDE) 12.5 MG capsule Take 12.5 mg by mouth daily.    . meclizine (ANTIVERT)  25 MG tablet Take 25 mg by mouth as needed for dizziness or nausea. Dizziness    . metoprolol (LOPRESSOR) 50 MG tablet Take 1 tablet by mouth 2 (two) times daily. Take with 25mg  tablet to equal 75mg  twice daily    . metoprolol tartrate (LOPRESSOR) 25 MG tablet Take 1 tablet by mouth 2 (two) times daily. Take with 50mg  tablet to equal 75mg  total twice daily    . Multiple Vitamins-Minerals (ICAPS AREDS FORMULA PO) Take 1 capsule by mouth 2 (two) times daily.     Vladimir Faster Glycol-Propyl Glycol (SYSTANE) 0.4-0.3 % SOLN Place 2 drops into both eyes 2 (two) times daily.     Marland Kitchen terbinafine (LAMISIL) 250 MG tablet Take 250 mg by mouth daily.    . verapamil (CALAN-SR) 240 MG CR tablet Take 240 mg by mouth daily.     . vitamin B-12 (CYANOCOBALAMIN) 250 MCG tablet Take 250 mcg by mouth daily.     . Omega-3 Fatty Acids (FISH OIL) 1000 MG CAPS Take 1,000 mg by mouth daily.      No facility-administered medications prior to visit.      Allergies:   Caffeine and Other   Social History   Social History  . Marital status: Married    Spouse name: N/A  . Number of children: N/A  . Years of education: N/A   Social History Main Topics  . Smoking  status: Former Smoker    Years: 3.00    Types: Cigarettes    Quit date: 12/18/1966  . Smokeless tobacco: Never Used  . Alcohol use Yes     Comment: occassional glass of wine  . Drug use: No  . Sexual activity: Not Asked   Other Topics Concern  . None   Social History Narrative   Lives with spouse in [place], Retired from [employer] History:  The patient's family history includes Diabetes in her mother and sister; Hypertension in her mother; Stroke in her mother.   ROS:   Please see the history of present illness.    ROS All other systems reviewed and are negative.  No flowsheet data found.     PHYSICAL EXAM:   VS:  BP 128/80   Pulse 87   Ht 5\' 5"  (1.651 m)   Wt 178 lb (80.7 kg)   BMI 29.62 kg/m    GEN: Well nourished, well  developed, in no acute distress  HEENT: normal  Neck: no JVD, carotid bruits, or masses Cardiac: irregularly irregular; no murmurs, rubs, or gallops,no edema.  Intact distal pulses bilaterally.  Respiratory:  clear to auscultation bilaterally, normal work of breathing GI: soft, nontender, nondistended, + BS MS: no deformity or atrophy  Skin: warm and dry, no rash Neuro:  Alert and Oriented x 3, Strength and sensation are intact Psych: euthymic mood, full affect  Wt Readings from Last 3 Encounters:  10/18/16 178 lb (80.7 kg)  04/19/16 173 lb (78.5 kg)  04/19/16 173 lb 12.8 oz (78.8 kg)      Studies/Labs Reviewed:   EKG:  EKG is ordered today.  The ekg ordered today demonstrates atrial fibrillation at 87bpm with nonspecific  ST changes  Recent Labs: 03/06/2016: BUN 14; Creat 0.75; Hemoglobin 14.5; Platelets 297; Potassium 4.2; Sodium 139   Lipid Panel    Component Value Date/Time   CHOL 173 02/06/2014 0851   TRIG 142.0 02/06/2014 0851   HDL 49.90 02/06/2014 0851   CHOLHDL 3 02/06/2014 0851   VLDL 28.4 02/06/2014 0851   LDLCALC 95 02/06/2014 0851    Additional studies/ records that were reviewed today include:  none    ASSESSMENT:    1. Permanent atrial fibrillation ([place])   2. Tachycardia-bradycardia syndrome ([place])   3. Benign essential HTN      PLAN:  In order of problems listed above:  1. Permanent atrial fibrillation rate controlled. Continue BB/CCB/eliquis. Check BMET and CBC. 2. Tachy brady syndrome s/p PPM - followed in pacer clinic 3. HTN - BP controlled on current meds. Continue diuretic/BB/CCB.    Medication Adjustments/Labs and Tests Ordered: Current medicines are reviewed at length with the patient today.  Concerns regarding medicines are outlined above.  Medication changes, Labs and Tests ordered today are listed in the Patient Instructions below.  There are no Patient Instructions on file for this visit.   Signed, Fransico Him, MD  10/18/2016  9:52 AM    [facility] [place], [place], [place]  [address] Phone: [phone]; Fax: [phone]

## 2016-10-18 NOTE — Patient Instructions (Signed)

## 2017-05-07 ENCOUNTER — Telehealth: Payer: Self-pay | Admitting: Internal Medicine

## 2017-05-07 NOTE — Telephone Encounter (Signed)
I am not aware of any Cardiologists there- recommend she find a good PCP and get a referral from them

## 2017-05-07 NOTE — Telephone Encounter (Signed)
New message     Pt moved to [place] , needs a referral to cardiologist in [place] do you have any recommendations

## 2017-05-07 NOTE — Telephone Encounter (Signed)
Pt calling to ask both Dr. Radford Pax and Dr. Caryl Comes if they had any recommendations of a Cardiologist, or Group, in or around [facility].  Pt states she has permanently moved to [facility], and will need to find a Cardiologist there.  Pt states she would like Dr. Caryl Comes or Dr. Theodosia Blender recommendation on this.  Pt states she would like to be referred if possible.  Pt states if she needs to sign a medical release form, the best was to get this to her is by mail.  Pt states she is permanently blind, so her Husband would address any paperwork that needs to be signed, in order to transfer her care to another Cardiologist.  Informed the pt that I will route this message to both Dr. Caryl Comes and Dr. Radford Pax for further review and recommendation.  Informed the pt that either Dr. Theodosia Blender RN or Dr. Olin Pia RN will follow-up with the pt accordingly, thereafter.  Pt verbalized understanding and agrees with this plan.

## 2017-05-07 NOTE — Telephone Encounter (Signed)
Informed patient Dr. Radford Pax does not know doctors in that area. Instructed her to get referral from PCP in [place].  Patient was grateful for assistance.

## 2017-05-25 ENCOUNTER — Encounter: Attending: Family | Primary: Internal Medicine

## 2017-06-05 ENCOUNTER — Ambulatory Visit: Admit: 2017-06-05 | Payer: MEDICARE | Attending: Family | Primary: Internal Medicine

## 2017-06-05 ENCOUNTER — Ambulatory Visit: Attending: Family | Primary: Internal Medicine

## 2017-06-05 DIAGNOSIS — I10 Essential (primary) hypertension: Secondary | ICD-10-CM

## 2017-06-05 DIAGNOSIS — E781 Pure hyperglyceridemia: Secondary | ICD-10-CM | POA: Diagnosis not present

## 2017-06-05 DIAGNOSIS — Z950 Presence of cardiac pacemaker: Secondary | ICD-10-CM | POA: Diagnosis not present

## 2017-06-05 DIAGNOSIS — Z6829 Body mass index (BMI) 29.0-29.9, adult: Secondary | ICD-10-CM | POA: Diagnosis not present

## 2017-06-05 DIAGNOSIS — H543 Unqualified visual loss, both eyes: Secondary | ICD-10-CM | POA: Diagnosis not present

## 2017-06-05 DIAGNOSIS — R42 Dizziness and giddiness: Secondary | ICD-10-CM | POA: Diagnosis not present

## 2017-06-05 DIAGNOSIS — I4891 Unspecified atrial fibrillation: Secondary | ICD-10-CM | POA: Diagnosis not present

## 2017-06-05 MED ORDER — METOPROLOL TARTRATE 25 MG TAB
25 mg | ORAL_TABLET | Freq: Two times a day (BID) | ORAL | 3 refills | Status: DC
Start: 2017-06-05 — End: 2017-08-02

## 2017-06-05 MED ORDER — HYDROCHLOROTHIAZIDE 12.5 MG TAB
12.5 mg | ORAL_TABLET | Freq: Every day | ORAL | 3 refills | Status: DC
Start: 2017-06-05 — End: 2017-08-02

## 2017-06-05 MED ORDER — APIXABAN 2.5 MG TABLET
2.5 mg | ORAL_TABLET | Freq: Two times a day (BID) | ORAL | 3 refills | Status: DC
Start: 2017-06-05 — End: 2017-07-20

## 2017-06-05 MED ORDER — FENOFIBRATE 160 MG TAB
160 mg | ORAL_TABLET | Freq: Every day | ORAL | 3 refills | Status: DC
Start: 2017-06-05 — End: 2017-08-02

## 2017-06-05 MED ORDER — MECLIZINE 25 MG TAB
25 mg | ORAL_TABLET | Freq: Four times a day (QID) | ORAL | 1 refills | Status: DC | PRN
Start: 2017-06-05 — End: 2017-08-02

## 2017-06-05 MED ORDER — METOPROLOL TARTRATE 50 MG TAB
50 mg | ORAL_TABLET | Freq: Two times a day (BID) | ORAL | 2 refills | Status: DC
Start: 2017-06-05 — End: 2017-09-26

## 2017-06-05 NOTE — Progress Notes (Signed)
Emily Henson is a 83 y.o. female presenting today for New Patient and Dizziness  .       HPI:  Emily Henson presents to the office today to establish care. 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  She is negative for chest pain, palpitation or dyspnea today.  She does have intermittent episodes of vertigo and is requesting refills on medications.      Review of Systems   Constitutional: Negative for chills and fever.   HENT: Negative for congestion.    Eyes: Negative for pain.   Respiratory: Negative for cough, sputum production and shortness of breath.    Cardiovascular: Negative for chest pain, palpitations and leg swelling.   Gastrointestinal: Negative for abdominal pain, diarrhea, heartburn, nausea and vomiting.   Genitourinary: Negative for dysuria and urgency.   Neurological: Positive for dizziness (history of vertigo). Negative for headaches.   Psychiatric/Behavioral: Negative for depression.       No Known Allergies    Current Outpatient Prescriptions   Medication Sig Dispense Refill   ??? cyanocobalamin 1,000 mcg tablet Take 1,000 mcg by mouth daily.     ??? cholecalciferol, vitamin D3, (VITAMIN D3) 2,000 unit tab Take 2,000 Units by mouth.     ??? LUTEIN-ZEAXANTHIN PO Take 1 Tab by mouth.     ??? meclizine (ANTIVERT) 25 mg tablet Take 1 Tab by mouth every six (6) hours as needed. 60 Tab 1   ??? metoprolol tartrate (LOPRESSOR) 50 mg tablet Take 1 Tab by mouth two (2) times a day. 60 Tab 2   ??? fenofibrate (LOFIBRA) 160 mg tablet Take 1 Tab by mouth daily. 30 Tab 3   ??? hydroCHLOROthiazide (HYDRODIURIL) 12.5 mg tablet Take 1 Tab by mouth daily. 30 Tab 3   ??? apixaban (ELIQUIS) 2.5 mg tablet Take 1 Tab by mouth two (2) times a day. 60 Tab 3    ??? metoprolol tartrate (LOPRESSOR) 25 mg tablet Take 1 Tab by mouth two (2) times a day. 60 Tab 3       Past Medical History:   Diagnosis Date   ??? Arrhythmia     Atrial Fibrillation   ??? Arthritis    ??? CAD (coronary artery disease)    ??? Hypercholesterolemia     hypertriglycerides   ??? Hypertension        Past Surgical History:   Procedure Laterality Date   ??? HX APPENDECTOMY     ??? HX BREAST REDUCTION  1990   ??? HX CHOLECYSTECTOMY     ??? HX COLECTOMY     ??? HX COLECTOMY     ??? HX COLONOSCOPY     ??? HX GYN      hysterectomy   ??? HX PACEMAKER         Social History     Social History   ??? Marital status: MARRIED     Spouse name: N/A   ??? Number of children: N/A   ??? Years of education: N/A     Occupational History   ??? Not on file.     Social History Main Topics   ??? Smoking status: Former Smoker   ??? Smokeless tobacco: Never Used   ??? Alcohol use No   ??? Drug use: No   ??? Sexual activity: No  Other Topics Concern   ??? Not on file     Social History Narrative   ??? No narrative on file       Patient does not have an advanced directive on file    Vitals:    06/05/17 1419   BP: 130/86   Pulse: 94   Resp: 16   Temp: 98.2 ??F (36.8 ??C)   TempSrc: Tympanic   SpO2: 97%   Weight: 177 lb (80.3 kg)   Height: 5\' 5"  (1.651 m)   PainSc:   0 - No pain       Physical Exam   Constitutional: She is oriented to person, place, and time. No distress.   HENT:   Head: Normocephalic.   Right Ear: External ear normal.   Left Ear: External ear normal.   Neck: Normal range of motion. Neck supple.   Cardiovascular: Normal rate, regular rhythm and normal heart sounds.    Pulmonary/Chest: Effort normal and breath sounds normal.   Abdominal: Soft. Bowel sounds are normal.   Musculoskeletal: Normal range of motion. She exhibits no edema.   Lymphadenopathy:     She has no cervical adenopathy.   Neurological: She is alert and oriented to person, place, and time. No cranial nerve deficit. Gait normal. Coordination normal.   Psychiatric: Affect normal.    Nursing note and vitals reviewed.      No results found for any previous visit.    .No results found for any visits on 06/05/17.    Assessment / Plan:      ICD-10-CM ICD-9-CM    1. Hypertension, unspecified type I10 401.9    2. Pacemaker Z95.0 V45.01 REFERRAL TO CARDIOLOGY   3. Atrial fibrillation, unspecified type (HCC) I48.91 427.31 REFERRAL TO CARDIOLOGY   4. Blind in both eyes H54.3 369.00 REFERRAL TO OPHTHALMOLOGY   5. Hypertriglyceridemia E78.1 272.1    6. Dizziness R42 780.4      Hypertension- controlled  Referral to Cardiology- establish care  Referral to Ophthalmology- 98 % blindness (establish care)  Dizziness-continue current treatment plan      Follow-up Disposition:  Return if symptoms worsen or fail to improve.    I asked the patient if she  had any questions and answered her  questions.  The patient stated that she understands the treatment plan and agrees with the treatment plan

## 2017-06-11 ENCOUNTER — Other Ambulatory Visit: Admit: 2017-06-11 | Discharge: 2017-06-12 | Payer: MEDICARE | Primary: Internal Medicine

## 2017-06-11 DIAGNOSIS — I4891 Unspecified atrial fibrillation: Secondary | ICD-10-CM

## 2017-06-11 DIAGNOSIS — E781 Pure hyperglyceridemia: Secondary | ICD-10-CM | POA: Diagnosis not present

## 2017-06-11 DIAGNOSIS — I10 Essential (primary) hypertension: Secondary | ICD-10-CM | POA: Diagnosis not present

## 2017-06-12 ENCOUNTER — Inpatient Hospital Stay: Admit: 2017-06-12 | Payer: MEDICARE | Primary: Internal Medicine

## 2017-06-12 LAB — CBC WITH AUTOMATED DIFF
ABS. BASOPHILS: 0 10*3/uL (ref 0.0–0.06)
ABS. EOSINOPHILS: 0.1 10*3/uL (ref 0.0–0.4)
ABS. LYMPHOCYTES: 2.1 10*3/uL (ref 0.9–3.6)
ABS. MONOCYTES: 0.6 10*3/uL (ref 0.05–1.2)
ABS. NEUTROPHILS: 3.7 10*3/uL (ref 1.8–8.0)
BASOPHILS: 0 % (ref 0–2)
EOSINOPHILS: 1 % (ref 0–5)
HCT: 45.3 % — ABNORMAL HIGH (ref 35.0–45.0)
HGB: 15 g/dL (ref 12.0–16.0)
LYMPHOCYTES: 32 % (ref 21–52)
MCH: 32 PG (ref 24.0–34.0)
MCHC: 33.1 g/dL (ref 31.0–37.0)
MCV: 96.6 FL (ref 74.0–97.0)
MONOCYTES: 10 % (ref 3–10)
MPV: 11.2 FL (ref 9.2–11.8)
NEUTROPHILS: 57 % (ref 40–73)
PLATELET: 270 10*3/uL (ref 135–420)
RBC: 4.69 M/uL (ref 4.20–5.30)
RDW: 13.8 % (ref 11.6–14.5)
WBC: 6.5 10*3/uL (ref 4.6–13.2)

## 2017-06-12 LAB — METABOLIC PANEL, COMPREHENSIVE
A-G Ratio: 1.1 (ref 0.8–1.7)
ALT (SGPT): 23 U/L (ref 13–56)
AST (SGOT): 18 U/L (ref 15–37)
Albumin: 3.7 g/dL (ref 3.4–5.0)
Alk. phosphatase: 61 U/L (ref 45–117)
Anion gap: 9 mmol/L (ref 3.0–18)
BUN/Creatinine ratio: 14 (ref 12–20)
BUN: 11 MG/DL (ref 7.0–18)
Bilirubin, total: 0.5 MG/DL (ref 0.2–1.0)
CO2: 28 mmol/L (ref 21–32)
Calcium: 10.4 MG/DL — ABNORMAL HIGH (ref 8.5–10.1)
Chloride: 106 mmol/L (ref 100–108)
Creatinine: 0.76 MG/DL (ref 0.6–1.3)
GFR est AA: >60 mL/min/{1.73_m2} (ref >60)
GFR est non-AA: >60 mL/min/{1.73_m2} (ref >60)
Globulin: 3.5 g/dL (ref 2.0–4.0)
Glucose: 80 mg/dL (ref 74–99)
Potassium: 4.1 mmol/L (ref 3.5–5.5)
Protein, total: 7.2 g/dL (ref 6.4–8.2)
Sodium: 143 mmol/L (ref 136–145)

## 2017-06-12 LAB — LIPID PANEL
CHOL/HDL Ratio: 3.2 (ref 0–5.0)
Cholesterol, total: 161 MG/DL (ref <200)
HDL Cholesterol: 50 MG/DL (ref 40–60)
LDL, calculated: 87.2 MG/DL (ref 0–100)
Triglyceride: 119 MG/DL (ref <150)
VLDL, calculated: 23.8 MG/DL

## 2017-06-12 LAB — TSH 3RD GENERATION: TSH: 1.25 u[IU]/mL (ref 0.36–3.74)

## 2017-06-25 DIAGNOSIS — H04123 Dry eye syndrome of bilateral lacrimal glands: Secondary | ICD-10-CM | POA: Diagnosis not present

## 2017-06-25 DIAGNOSIS — H3552 Pigmentary retinal dystrophy: Secondary | ICD-10-CM | POA: Diagnosis not present

## 2017-06-25 DIAGNOSIS — H548 Legal blindness, as defined in USA: Secondary | ICD-10-CM | POA: Diagnosis not present

## 2017-06-25 DIAGNOSIS — H04122 Dry eye syndrome of left lacrimal gland: Secondary | ICD-10-CM | POA: Diagnosis not present

## 2017-06-25 DIAGNOSIS — H04121 Dry eye syndrome of right lacrimal gland: Secondary | ICD-10-CM | POA: Diagnosis not present

## 2017-07-18 NOTE — Telephone Encounter (Signed)
Requested Prescriptions     Pending Prescriptions Disp Refills   ??? hydroCHLOROthiazide (HYDRODIURIL) 12.5 mg tablet 30 Tab 3     Sig: Take 1 Tab by mouth daily.   ??? apixaban (ELIQUIS) 2.5 mg tablet 60 Tab 3     Sig: Take 1 Tab by mouth two (2) times a day.   ??? meclizine (ANTIVERT) 25 mg tablet 60 Tab 1     Sig: Take 1 Tab by mouth every six (6) hours as needed.   ??? fenofibrate (LOFIBRA) 160 mg tablet 30 Tab 3     Sig: Take 1 Tab by mouth daily.   ??? metoprolol tartrate (LOPRESSOR) 25 mg tablet 60 Tab 3     Sig: Take 1 Tab by mouth two (2) times a day.     Patient also  need verapamil Hcl er caps 240 mg generic for  Verelan Sr 240mg 

## 2017-07-18 NOTE — Telephone Encounter (Signed)
Please call patient  With lab results and mail them out thanks [phone]

## 2017-07-20 NOTE — Telephone Encounter (Signed)
Requested Prescriptions     Pending Prescriptions Disp Refills   ??? apixaban (ELIQUIS) 2.5 mg tablet 60 Tab 3     Sig: Take 1 Tab by mouth two (2) times a day.

## 2017-07-20 NOTE — Telephone Encounter (Signed)
I have attempted to contact this patient by phone with the following results: left message to return my call on answering machine. In reference to Labs being within normal limits

## 2017-07-24 MED ORDER — APIXABAN 2.5 MG TABLET
2.5 mg | ORAL_TABLET | Freq: Two times a day (BID) | ORAL | 3 refills | Status: DC
Start: 2017-07-24 — End: 2017-07-25

## 2017-07-24 NOTE — Telephone Encounter (Signed)
I have attempted to contact this patient by phone with the following results: left message to return my call on answering machine. Letter sent to address on record

## 2017-07-25 NOTE — Telephone Encounter (Signed)
Requested Prescriptions     Pending Prescriptions Disp Refills   ??? apixaban (ELIQUIS) 2.5 mg tablet 180 Tab 3     Sig: Take 1 Tab by mouth two (2) times a day.     90 day supply needed for mail order

## 2017-07-27 MED ORDER — APIXABAN 2.5 MG TABLET
2.5 mg | ORAL_TABLET | Freq: Two times a day (BID) | ORAL | 3 refills | Status: DC
Start: 2017-07-27 — End: 2017-09-26

## 2017-08-02 DIAGNOSIS — Z7901 Long term (current) use of anticoagulants: Secondary | ICD-10-CM | POA: Diagnosis not present

## 2017-08-02 DIAGNOSIS — I482 Chronic atrial fibrillation: Secondary | ICD-10-CM | POA: Diagnosis not present

## 2017-08-02 DIAGNOSIS — I495 Sick sinus syndrome: Secondary | ICD-10-CM | POA: Diagnosis not present

## 2017-08-02 DIAGNOSIS — H547 Unspecified visual loss: Secondary | ICD-10-CM | POA: Diagnosis not present

## 2017-08-02 DIAGNOSIS — I158 Other secondary hypertension: Secondary | ICD-10-CM | POA: Diagnosis not present

## 2017-08-02 DIAGNOSIS — R001 Bradycardia, unspecified: Secondary | ICD-10-CM | POA: Diagnosis not present

## 2017-08-02 DIAGNOSIS — Z950 Presence of cardiac pacemaker: Secondary | ICD-10-CM | POA: Diagnosis not present

## 2017-08-02 MED ORDER — HYDROCHLOROTHIAZIDE 12.5 MG TAB
12.5 mg | ORAL_TABLET | Freq: Every day | ORAL | 5 refills | Status: DC
Start: 2017-08-02 — End: 2017-09-26

## 2017-08-02 NOTE — Telephone Encounter (Signed)
Requested Prescriptions     Pending Prescriptions Disp Refills   ??? fenofibrate (LOFIBRA) 160 mg tablet 90 Tab 3     Sig: Take 1 Tab by mouth daily.   ??? metoprolol tartrate (LOPRESSOR) 25 mg tablet 60 Tab 3     Sig: Take 1 Tab by mouth two (2) times a day.   ??? meclizine (ANTIVERT) 25 mg tablet 90 Tab 1     Sig: Take 1 Tab by mouth every six (6) hours as needed.        ALSO VERAPAMIL hcl   #240 NOT ON MED LIST

## 2017-08-02 NOTE — Telephone Encounter (Signed)
Requested Prescriptions     Pending Prescriptions Disp Refills   ??? hydroCHLOROthiazide (HYDRODIURIL) 12.5 mg tablet 30 Tab 5     Sig: Take 1 Tab by mouth daily.

## 2017-08-16 MED ORDER — FENOFIBRATE 160 MG TAB
160 mg | ORAL_TABLET | Freq: Every day | ORAL | 3 refills | Status: DC
Start: 2017-08-16 — End: 2017-09-26

## 2017-08-16 MED ORDER — METOPROLOL TARTRATE 25 MG TAB
25 mg | ORAL_TABLET | Freq: Two times a day (BID) | ORAL | 3 refills | Status: DC
Start: 2017-08-16 — End: 2017-09-26

## 2017-08-16 MED ORDER — MECLIZINE 25 MG TAB
25 mg | ORAL_TABLET | Freq: Four times a day (QID) | ORAL | 3 refills | Status: DC | PRN
Start: 2017-08-16 — End: 2018-03-12

## 2017-08-16 NOTE — Telephone Encounter (Signed)
Emily Henson husband confirmed she would not be taking the verapamil.

## 2017-08-16 NOTE — Telephone Encounter (Signed)
Mr and Mrs Georgian CoRiddick contacted and confirmed that she was using verapamil but they did not know where she was prescribed it. There did seem to be some confusion over her medications and Mr Georgian CoRiddick who manages her medications stated that he will stop giving her the verapamil and contact office if they need any further assistance.

## 2017-08-21 ENCOUNTER — Ambulatory Visit: Admit: 2017-08-21 | Discharge: 2017-08-21 | Payer: MEDICARE | Attending: Family | Primary: Internal Medicine

## 2017-08-21 DIAGNOSIS — I10 Essential (primary) hypertension: Secondary | ICD-10-CM

## 2017-08-21 DIAGNOSIS — Z6829 Body mass index (BMI) 29.0-29.9, adult: Secondary | ICD-10-CM | POA: Diagnosis not present

## 2017-08-21 DIAGNOSIS — I4891 Unspecified atrial fibrillation: Secondary | ICD-10-CM | POA: Diagnosis not present

## 2017-08-21 NOTE — Progress Notes (Signed)
Chief Complaint   Patient presents with   ??? Medication Evaluation         Is someone accompanying this pt? yes    Is the patient using any DME equipment during OV? yes    Depression Screening:  PHQ over the last two weeks 06/05/2017   Little interest or pleasure in doing things Not at all   Feeling down, depressed, irritable, or hopeless Not at all   Total Score PHQ 2 0       Learning Assessment:  Learning Assessment 08/21/2017   PRIMARY LEARNER Patient   HIGHEST LEVEL OF EDUCATION - PRIMARY LEARNER  GRADUATED HIGH SCHOOL OR GED   BARRIERS PRIMARY LEARNER VISUAL   CO-LEARNER CAREGIVER No   PRIMARY LANGUAGE ENGLISH   LEARNER PREFERENCE PRIMARY OTHER (COMMENT)   ANSWERED BY patient   RELATIONSHIP SELF       Abuse Screening:  Abuse Screening Questionnaire 08/21/2017   Do you ever feel afraid of your partner? N   Are you in a relationship with someone who physically or mentally threatens you? N   Is it safe for you to go home? Y       Fall Risk  Fall Risk Assessment, last 12 mths 06/05/2017   Able to walk? Yes   Fall in past 12 months? No         Health Maintenance reviewed and discussed per provider.    Pt is due for   Health Maintenance Due   Topic   ??? DTaP/Tdap/Td series (1 - Tdap)   ??? ZOSTER VACCINE AGE 81>    ??? GLAUCOMA SCREENING Q2Y    ??? Bone Densitometry (Dexa) Screening    ??? MEDICARE YEARLY EXAM    ??? Influenza Age 81 to Adult     Please order/place referral if appropriate.      Pt currently taking Antiplatelet therapy? no      Coordination of Care:  1. Have you been to the ER, urgent care clinic since your last visit? Hospitalized since your last visit? no    2. Have you seen or consulted any other health care providers outside of the [facility] since your last visit? Include any pap smears or colon screening. no    Please see Red banners under Allergies, Med rec, Immunizations to remove outside inquires. All correct information has been verified with patient and added to chart.

## 2017-08-21 NOTE — Progress Notes (Signed)
Emily Henson is a 83 y.o. female presenting today for Medication Evaluation  .       HPI:  [name] presents to the office today for medication evaluation. Patient was instructed to stop Verapamil secondary to a contraindication with her Eliquis.  Patient presents to office today for hypertension evaluation.  Her blood pressure today is controlled and she is negative for chest pain or dyspnea.      Review of Systems   Respiratory: Negative for cough.    Cardiovascular: Negative for chest pain and palpitations.   Neurological: Negative for headaches.       No Known Allergies    Current Outpatient Prescriptions   Medication Sig Dispense Refill   ??? fenofibrate (LOFIBRA) 160 mg tablet Take 1 Tab by mouth daily. 90 Tab 3   ??? metoprolol tartrate (LOPRESSOR) 25 mg tablet Take 1 Tab by mouth two (2) times a day. 180 Tab 3   ??? hydroCHLOROthiazide (HYDRODIURIL) 12.5 mg tablet Take 1 Tab by mouth daily. 30 Tab 5   ??? apixaban (ELIQUIS) 2.5 mg tablet Take 1 Tab by mouth two (2) times a day. 180 Tab 3   ??? cyanocobalamin 1,000 mcg tablet Take 1,000 mcg by mouth daily.     ??? cholecalciferol, vitamin D3, (VITAMIN D3) 2,000 unit tab Take 2,000 Units by mouth.     ??? metoprolol tartrate (LOPRESSOR) 50 mg tablet Take 1 Tab by mouth two (2) times a day. 60 Tab 2   ??? meclizine (ANTIVERT) 25 mg tablet Take 1 Tab by mouth every six (6) hours as needed. 360 Tab 3   ??? LUTEIN-ZEAXANTHIN PO Take 1 Tab by mouth.         Past Medical History:   Diagnosis Date   ??? Arrhythmia     Atrial Fibrillation   ??? Arthritis    ??? CAD (coronary artery disease)    ??? Hypercholesterolemia     hypertriglycerides   ??? Hypertension        Past Surgical History:   Procedure Laterality Date   ??? HX APPENDECTOMY     ??? HX BREAST REDUCTION  1990   ??? HX CHOLECYSTECTOMY     ??? HX COLECTOMY     ??? HX COLECTOMY     ??? HX COLONOSCOPY     ??? HX GYN      hysterectomy   ??? HX PACEMAKER         Social History     Social History   ??? Marital status: MARRIED     Spouse name: N/A    ??? Number of children: N/A   ??? Years of education: N/A     Occupational History   ??? Not on file.     Social History Main Topics   ??? Smoking status: Former Smoker   ??? Smokeless tobacco: Never Used   ??? Alcohol use No   ??? Drug use: No   ??? Sexual activity: No     Other Topics Concern   ??? Not on file     Social History Narrative       Patient does not have an advanced directive on file    Vitals:    08/21/17 1141   BP: 124/82   Pulse: 78   Resp: 16   Temp: 97.9 ??F (36.6 ??C)   TempSrc: Tympanic   SpO2: 99%   Weight: 175 lb (79.4 kg)   Height: 5' 5"  (1.651 m)   PainSc:   0 - No pain  Physical Exam   Constitutional: No distress.   Cardiovascular: Normal rate.  An irregularly irregular rhythm present.   Pulmonary/Chest: Effort normal and breath sounds normal.   Neurological: She is alert.   Nursing note and vitals reviewed.      Hospital Outpatient Visit on 06/11/2017   Component Date Value Ref Range Status   ??? WBC 06/11/2017 6.5  4.6 - 13.2 K/uL Final   ??? RBC 06/11/2017 4.69  4.20 - 5.30 M/uL Final   ??? HGB 06/11/2017 15.0  12.0 - 16.0 g/dL Final   ??? HCT 06/11/2017 45.3* 35.0 - 45.0 % Final   ??? MCV 06/11/2017 96.6  74.0 - 97.0 FL Final   ??? MCH 06/11/2017 32.0  24.0 - 34.0 PG Final   ??? MCHC 06/11/2017 33.1  31.0 - 37.0 g/dL Final   ??? RDW 06/11/2017 13.8  11.6 - 14.5 % Final   ??? PLATELET 06/11/2017 270  135 - 420 K/uL Final   ??? MPV 06/11/2017 11.2  9.2 - 11.8 FL Final   ??? NEUTROPHILS 06/11/2017 57  40 - 73 % Final   ??? LYMPHOCYTES 06/11/2017 32  21 - 52 % Final   ??? MONOCYTES 06/11/2017 10  3 - 10 % Final   ??? EOSINOPHILS 06/11/2017 1  0 - 5 % Final   ??? BASOPHILS 06/11/2017 0  0 - 2 % Final   ??? ABS. NEUTROPHILS 06/11/2017 3.7  1.8 - 8.0 K/UL Final   ??? ABS. LYMPHOCYTES 06/11/2017 2.1  0.9 - 3.6 K/UL Final   ??? ABS. MONOCYTES 06/11/2017 0.6  0.05 - 1.2 K/UL Final   ??? ABS. EOSINOPHILS 06/11/2017 0.1  0.0 - 0.4 K/UL Final   ??? ABS. BASOPHILS 06/11/2017 0.0  0.0 - 0.06 K/UL Final   ??? DF 06/11/2017 AUTOMATED    Final    ??? LIPID PROFILE 06/11/2017        Final   ??? Cholesterol, total 06/11/2017 161  <200 MG/DL Final   ??? Triglyceride 06/11/2017 119  <150 MG/DL Final    Comment: The drugs N-acetylcysteine (NAC) and  Metamiszole have been found to cause falsely  low results in this chemical assay. Please  be sure to submit blood samples obtained  BEFORE administration of either of these  drugs to assure correct results.     ??? HDL Cholesterol 06/11/2017 50  40 - 60 MG/DL Final   ??? LDL, calculated 06/11/2017 87.2  0 - 100 MG/DL Final   ??? VLDL, calculated 06/11/2017 23.8  MG/DL Final   ??? CHOL/HDL Ratio 06/11/2017 3.2  0 - 5.0   Final   ??? Sodium 06/11/2017 143  136 - 145 mmol/L Final   ??? Potassium 06/11/2017 4.1  3.5 - 5.5 mmol/L Final   ??? Chloride 06/11/2017 106  100 - 108 mmol/L Final   ??? CO2 06/11/2017 28  21 - 32 mmol/L Final   ??? Anion gap 06/11/2017 9  3.0 - 18 mmol/L Final   ??? Glucose 06/11/2017 80  74 - 99 mg/dL Final   ??? BUN 06/11/2017 11  7.0 - 18 MG/DL Final   ??? Creatinine 06/11/2017 0.76  0.6 - 1.3 MG/DL Final   ??? BUN/Creatinine ratio 06/11/2017 14  12 - 20   Final   ??? GFR est AA 06/11/2017 >60  >60 ml/min/1.53m Final   ??? GFR est non-AA 06/11/2017 >60  >60 ml/min/1.751mFinal    Comment: (NOTE)  Estimated GFR is calculated using the Modification of Diet in Renal  Disease (MDRD) Study equation, reported for both African Americans   (GFRAA) and non-African Americans (GFRNA), and normalized to 1.48m   body surface area. The physician must decide which value applies to   the patient. The MDRD study equation should only be used in   individuals age 2185or older. It has not been validated for the   following: pregnant women, patients with serious comorbid conditions,   or on certain medications, or persons with extremes of body size,   muscle mass, or nutritional status.     ??? Calcium 06/11/2017 10.4* 8.5 - 10.1 MG/DL Final   ??? Bilirubin, total 06/11/2017 0.5  0.2 - 1.0 MG/DL Final   ??? ALT (SGPT) 06/11/2017 23  13 - 56 U/L Final    ??? AST (SGOT) 06/11/2017 18  15 - 37 U/L Final   ??? Alk. phosphatase 06/11/2017 61  45 - 117 U/L Final   ??? Protein, total 06/11/2017 7.2  6.4 - 8.2 g/dL Final   ??? Albumin 06/11/2017 3.7  3.4 - 5.0 g/dL Final   ??? Globulin 06/11/2017 3.5  2.0 - 4.0 g/dL Final   ??? A-G Ratio 06/11/2017 1.1  0.8 - 1.7   Final   ??? TSH 06/11/2017 1.25  0.36 - 3.74 uIU/mL Final       .No results found for any visits on 08/21/17.    Assessment / Plan:      ICD-10-CM ICD-9-CM    1. Hypertension, unspecified type I10 401.9    2. Atrial fibrillation, unspecified type ([place] I48.91 427.31      STOP Verapamil  HTN- controlled. Continue to monitor blood pressure x 2 weeks ( 3 times per week)  Patient will report elevated blood pressure readings    Follow-up Disposition:  Return if symptoms worsen or fail to improve.    I asked the patient if she  had any questions and answered her  questions.  The patient stated that she understands the treatment plan and agrees with the treatment plan    Discussed the patient's BMI with her.  The BMI follow up plan is as follows:     dietary manageent education, guidance, and counseling  encourage exercise  monitor weight  prescribed dietary intake    An After Visit Summary was printed and given to the patient.

## 2017-08-21 NOTE — Patient Instructions (Signed)
Body Mass Index: Care Instructions  Your Care Instructions    Body mass index (BMI) can help you see if your weight is raising your risk for health problems. It uses a formula to compare how much you weigh with how tall you are.  ?? A BMI lower than 18.5 is considered underweight.  ?? A BMI between 18.5 and 24.9 is considered healthy.  ?? A BMI between 25 and 29.9 is considered overweight. A BMI of 30 or higher is considered obese.  If your BMI is in the normal range, it means that you have a lower risk for weight-related health problems. If your BMI is in the overweight or obese range, you may be at increased risk for weight-related health problems, such as high blood pressure, heart disease, stroke, arthritis or joint pain, and diabetes. If your BMI is in the underweight range, you may be at increased risk for health problems such as fatigue, lower protection (immunity) against illness, muscle loss, bone loss, hair loss, and hormone problems.  BMI is just one measure of your risk for weight-related health problems. You may be at higher risk for health problems if you are not active, you eat an unhealthy diet, or you drink too much alcohol or use tobacco products.  Follow-up care is a key part of your treatment and safety. Be sure to make and go to all appointments, and call your doctor if you are having problems. It's also a good idea to know your test results and keep a list of the medicines you take.  How can you care for yourself at home?  ?? Practice healthy eating habits. This includes eating plenty of fruits, vegetables, whole grains, lean protein, and low-fat dairy.  ?? If your doctor recommends it, get more exercise. Walking is a good choice. Bit by bit, increase the amount you walk every day. Try for at least 30 minutes on most days of the week.  ?? Do not smoke. Smoking can increase your risk for health problems. If you need help quitting, talk to your doctor about stop-smoking programs and  medicines. These can increase your chances of quitting for good.  ?? Limit alcohol to 2 drinks a day for men and 1 drink a day for women. Too much alcohol can cause health problems.  If you have a BMI higher than 25  ?? Your doctor may do other tests to check your risk for weight-related health problems. This may include measuring the distance around your waist. A waist measurement of more than 40 inches in men or 35 inches in women can increase the risk of weight-related health problems.  ?? Talk with your doctor about steps you can take to stay healthy or improve your health. You may need to make lifestyle changes to lose weight and stay healthy, such as changing your diet and getting regular exercise.  If you have a BMI lower than 18.5  ?? Your doctor may do other tests to check your risk for health problems.  ?? Talk with your doctor about steps you can take to stay healthy or improve your health. You may need to make lifestyle changes to gain or maintain weight and stay healthy, such as getting more healthy foods in your diet and doing exercises to build muscle.  Where can you learn more?  Go to http://www.healthwise.net/[employer].  Enter S176 in the search box to learn more about "Body Mass Index: Care Instructions."  Current as of: September 30, 2015  Content Version: 11.4  ??   2006-2017 [employer]. Care instructions adapted under license by [employer] (which disclaims liability or warranty for this information). If you have questions about a medical condition or this instruction, always ask your healthcare professional. [employer] Inc[employer] disclaims any warranty or liability for your use of this information.

## 2017-09-11 NOTE — Telephone Encounter (Signed)
[facility] called and stated they received request for 50 mg Metoprolol.  They just filled Metoprolol 25 mg on 08/16/17.  They need to know if she should be getting the 50 mg Metoprolol.  Please advise at [phone]  OFB#[id number]

## 2017-09-14 NOTE — Telephone Encounter (Signed)
[facility] called back to check the status of this request

## 2017-09-18 NOTE — Telephone Encounter (Signed)
Emily Henson contacted and confirmed that she takes both doses of metoprolol both 50 mg and 25 mg  To equal 75 mg's and expressed understanding, [facility] and clarification was given.

## 2017-09-26 ENCOUNTER — Ambulatory Visit: Admit: 2017-09-26 | Payer: PRIVATE HEALTH INSURANCE | Attending: Family | Primary: Internal Medicine

## 2017-09-26 DIAGNOSIS — M546 Pain in thoracic spine: Secondary | ICD-10-CM

## 2017-09-26 DIAGNOSIS — Z6829 Body mass index (BMI) 29.0-29.9, adult: Secondary | ICD-10-CM | POA: Diagnosis not present

## 2017-09-26 DIAGNOSIS — R350 Frequency of micturition: Secondary | ICD-10-CM | POA: Diagnosis not present

## 2017-09-26 DIAGNOSIS — Z23 Encounter for immunization: Secondary | ICD-10-CM | POA: Diagnosis not present

## 2017-09-26 DIAGNOSIS — I10 Essential (primary) hypertension: Secondary | ICD-10-CM | POA: Diagnosis not present

## 2017-09-26 DIAGNOSIS — I4891 Unspecified atrial fibrillation: Secondary | ICD-10-CM | POA: Diagnosis not present

## 2017-09-26 LAB — AMB POC URINALYSIS DIP STICK AUTO W/O MICRO
Bilirubin (UA POC): NEGATIVE
Blood (UA POC): NEGATIVE
Glucose (UA POC): NEGATIVE
Ketones (UA POC): NEGATIVE
Leukocyte esterase (UA POC): NEGATIVE
Nitrites (UA POC): NEGATIVE
Protein (UA POC): NEGATIVE
Specific gravity (UA POC): 1.02 (ref 1.001–1.035)
Urobilinogen (UA POC): 0.2 (ref 0.2–1)
pH (UA POC): 6.5 (ref 4.6–8.0)

## 2017-09-26 MED ORDER — METOPROLOL TARTRATE 25 MG TAB
25 mg | ORAL_TABLET | Freq: Two times a day (BID) | ORAL | 2 refills | Status: DC
Start: 2017-09-26 — End: 2018-03-12

## 2017-09-26 MED ORDER — HYDROCHLOROTHIAZIDE 12.5 MG TAB
12.5 mg | ORAL_TABLET | Freq: Every day | ORAL | 2 refills | Status: DC
Start: 2017-09-26 — End: 2018-03-12

## 2017-09-26 MED ORDER — CHOLECALCIFEROL (VITAMIN D3) 50 MCG (2,000 UNIT) TABLET
50 mcg (2,000 unit) | ORAL_TABLET | Freq: Every day | ORAL | 2 refills | Status: AC
Start: 2017-09-26 — End: ?

## 2017-09-26 MED ORDER — FENOFIBRATE 160 MG TAB
160 mg | ORAL_TABLET | Freq: Every day | ORAL | 2 refills | Status: DC
Start: 2017-09-26 — End: 2018-03-12

## 2017-09-26 MED ORDER — CYANOCOBALAMIN 1,000 MCG TAB
1000 mcg | ORAL_TABLET | Freq: Every day | ORAL | 2 refills | Status: DC
Start: 2017-09-26 — End: 2018-09-24

## 2017-09-26 MED ORDER — APIXABAN 5 MG TABLET
5 mg | ORAL_TABLET | Freq: Two times a day (BID) | ORAL | 2 refills | Status: DC
Start: 2017-09-26 — End: 2018-03-12

## 2017-09-26 MED ORDER — METOPROLOL TARTRATE 50 MG TAB
50 mg | ORAL_TABLET | Freq: Two times a day (BID) | ORAL | 2 refills | Status: DC
Start: 2017-09-26 — End: 2018-03-12

## 2017-09-26 NOTE — Telephone Encounter (Addendum)
Requested Prescriptions     Pending Prescriptions Disp Refills   ??? apixaban (ELIQUIS) 2.5 mg tablet 180 Tab 3     Sig: Take 1 Tab by mouth two (2) times a day.   ??? cholecalciferol, vitamin D3, (VITAMIN D3) 2,000 unit tab       Sig: Take 1 Tab by mouth.   ??? cyanocobalamin 1,000 mcg tablet       Sig: Take 1 Tab by mouth daily.   ??? fenofibrate (LOFIBRA) 160 mg tablet 90 Tab 3     Sig: Take 1 Tab by mouth daily.   ??? hydroCHLOROthiazide (HYDRODIURIL) 12.5 mg tablet 30 Tab 5     Sig: Take 1 Tab by mouth daily.   ??? metoprolol tartrate (LOPRESSOR) 25 mg tablet 180 Tab 3     Sig: Take 1 Tab by mouth two (2) times a day.   ??? metoprolol tartrate (LOPRESSOR) 50 mg tablet 60 Tab 2     Sig: Take 1 Tab by mouth two (2) times a day.     Please print prescriptions thanks and mail them to her .

## 2017-09-26 NOTE — Progress Notes (Signed)
Emily Henson is a 83 y.o. female who presents for routine immunizations.   She denies any symptoms , reactions or allergies that would exclude them from being immunized today.  Risks and adverse reactions were discussed and the VIS was given to them. All questions were addressed.  She was observed for 15 min post injection. There were no reactions observed.    Simone Curia, LPN

## 2017-09-26 NOTE — Patient Instructions (Signed)
Vaccine Information Statement    Influenza (Flu) Vaccine (Inactivated or Recombinant): What you need to know    Many Vaccine Information Statements are available in Spanish and other languages. See www.immunize.org/vis  Hojas de Informaci??n Sobre Vacunas est??n disponibles en Espa??ol y en muchos otros idiomas. Visite www.immunize.org/vis    1. Why get vaccinated?    Influenza (???flu???) is a contagious disease that spreads around the [place] every year, usually between October and May.     Flu is caused by influenza viruses, and is spread mainly by coughing, sneezing, and close contact.     Anyone can get flu. Flu strikes suddenly and can last several days. Symptoms vary by age, but can include:  ??? fever/chills  ??? sore throat  ??? muscle aches  ??? fatigue  ??? cough  ??? headache   ??? runny or stuffy nose    Flu can also lead to pneumonia and blood infections, and cause diarrhea and seizures in children.  If you have a medical condition, such as heart or lung disease, flu can make it worse.    Flu is more dangerous for some people. Infants and young children, people 65 years of age and older, pregnant women, and people with certain health conditions or a weakened immune system are at greatest risk.      Each year thousands of people in the [place] die from flu, and many more are hospitalized.     Flu vaccine can:  ??? keep you from getting flu,  ??? make flu less severe if you do get it, and  ??? keep you from spreading flu to your family and other people.     2. Inactivated and recombinant flu vaccines    A dose of flu vaccine is recommended every flu season. Children 6 months through 8 years of age may need two doses during the same flu season.  Everyone else needs only one dose each flu season.       Some inactivated flu vaccines contain a very small amount of a mercury-based preservative called thimerosal. Studies have not shown thimerosal in vaccines to be harmful, but flu vaccines that do not contain  thimerosal are available.    There is no live flu virus in flu shots.  They cannot cause the flu.     There are many flu viruses, and they are always changing. Each year a new flu vaccine is made to protect against three or four viruses that are likely to cause disease in the upcoming flu season. But even when the vaccine doesn???t exactly match these viruses, it may still provide some protection    Flu vaccine cannot prevent:  ??? flu that is caused by a virus not covered by the vaccine, or  ??? illnesses that look like flu but are not.    It takes about 2 weeks for protection to develop after vaccination, and protection lasts through the flu season.     3. Some people should not get this vaccine    Tell the person who is giving you the vaccine:    ??? If you have any severe, life-threatening allergies.    If you ever had a life-threatening allergic reaction after a dose of flu vaccine, or have a severe allergy to any part of this vaccine, you may be advised not to get vaccinated.  Most, but not all, types of flu vaccine contain a small amount of egg protein.       ??? If you   ever had Guillain-Barr?? Syndrome (also called GBS).   Some people with a history of GBS should not get this vaccine. This should be discussed with your doctor.    ??? If you are not feeling well.    It is usually okay to get flu vaccine when you have a mild illness, but you might be asked to come back when you feel better.      4. Risks of a vaccine reaction    With any medicine, including vaccines, there is a chance of reactions. These are usually mild and go away on their own, but serious reactions are also possible.     Most people who get a flu shot do not have any problems with it.     Minor problems following a flu shot include:   ??? soreness, redness, or swelling where the shot was given    ??? hoarseness  ??? sore, red or itchy eyes  ??? cough  ??? fever  ??? aches  ??? headache  ??? itching  ??? fatigue   If these problems occur, they usually begin soon after the shot and last 1 or 2 days.     More serious problems following a flu shot can include the following:    ??? There may be a small increased risk of Guillain-Barr?? Syndrome (GBS) after inactivated flu vaccine.  This risk has been estimated at 1 or 2 additional cases per million people vaccinated. This is much lower than the risk of severe complications from flu, which can be prevented by flu vaccine.      ??? Young children who get the flu shot along with pneumococcal vaccine (PCV13) and/or DTaP vaccine at the same time might be slightly more likely to have a seizure caused by fever. Ask your doctor for more information. Tell your doctor if a child who is getting flu vaccine has ever had a seizure.     Problems that could happen after any injected vaccine:     ??? People sometimes faint after a medical procedure, including vaccination. Sitting or lying down for about 15 minutes can help prevent fainting, and injuries caused by a fall. Tell your doctor if you feel dizzy, or have vision changes or ringing in the ears.    ??? Some people get severe pain in the shoulder and have difficulty moving the arm where a shot was given. This happens very rarely.    ??? Any medication can cause a severe allergic reaction. Such reactions from a vaccine are very rare, estimated at about 1 in a million doses, and would happen within a few minutes to a few hours after the vaccination.    As with any medicine, there is a very remote chance of a vaccine causing a serious injury or death.    The safety of vaccines is always being monitored. For more information, visit: www.cdc.gov/vaccinesafety/    5. What if there is a serious reaction?    What should I look for?    ??? Look for anything that concerns you, such as signs of a severe allergic reaction, very high fever, or unusual behavior.    Signs of a severe allergic reaction can include hives, swelling of the  face and throat, difficulty breathing, a fast heartbeat, dizziness, and weakness ??? usually within a few minutes to a few hours after the vaccination.    What should I do?    ??? If you think it is a severe allergic reaction or other emergency that   can???t wait, call 9-1-1 and get the person to the nearest hospital. Otherwise, call your doctor.    ??? Reactions should be reported to the Vaccine Adverse Event Reporting System (VAERS). Your doctor should file this report, or you can do it yourself through  the VAERS web site at www.vaers.hhs.gov, or by calling 1-800-822-7967.    VAERS does not give medical advice.    6. The National Vaccine Injury Compensation Program    The National Vaccine Injury Compensation Program (VICP) is a federal program that was created to compensate people who may have been injured by certain vaccines.    Persons who believe they may have been injured by a vaccine can learn about the program and about filing a claim by calling 1-800-338-2382 or visiting the VICP website at www.hrsa.gov/vaccinecompensation.  There is a time limit to file a claim for compensation.    7. How can I learn more?  ??? Ask your healthcare provider. He or she can give you the vaccine package insert or suggest other sources of information.  ??? Call your local or state health department.  ??? Contact the Centers for Disease Control and Prevention (CDC):  - Call 1-800-232-4636 (1-800-CDC-INFO) or  - Visit CDC???s website at www.cdc.gov/flu    Vaccine Information Statement   Inactivated Influenza Vaccine   07/24/2014  42 [place] ?? 300aa-26    Department of Health and Human Services  Centers for Disease Control and Prevention    Office Use Only

## 2017-09-26 NOTE — Progress Notes (Signed)
Emily Henson is a 83 y.o. female presenting today for Back Pain  .       HPI:  Emily Henson presents to the office today for back pain.  Patient reports she woke up this morning with right lateral thoracic back pain.  She reports the pain radiated down to her right lateral lumbar area.  She notes the pain has resolved since coming to the office today.  She admits to frequency but reports she drinks a lot of water.  She denies any known injury or any history of lumbar pain.    Review of Systems   Respiratory: Negative for cough.    Cardiovascular: Negative for chest pain and palpitations.   Gastrointestinal: Negative for abdominal pain, nausea and vomiting.   Genitourinary: Positive for frequency. Negative for dysuria.   Musculoskeletal: Positive for back pain (resolved prior to visit).       No Known Allergies    Current Outpatient Prescriptions   Medication Sig Dispense Refill   ??? fenofibrate (LOFIBRA) 160 mg tablet Take 1 Tab by mouth daily. 90 Tab 3   ??? metoprolol tartrate (LOPRESSOR) 25 mg tablet Take 1 Tab by mouth two (2) times a day. 180 Tab 3   ??? meclizine (ANTIVERT) 25 mg tablet Take 1 Tab by mouth every six (6) hours as needed. 360 Tab 3   ??? hydroCHLOROthiazide (HYDRODIURIL) 12.5 mg tablet Take 1 Tab by mouth daily. 30 Tab 5   ??? apixaban (ELIQUIS) 2.5 mg tablet Take 1 Tab by mouth two (2) times a day. (Patient taking differently: Take 5 mg by mouth two (2) times a day.) 180 Tab 3   ??? cyanocobalamin 1,000 mcg tablet Take 1,000 mcg by mouth daily.     ??? cholecalciferol, vitamin D3, (VITAMIN D3) 2,000 unit tab Take 2,000 Units by mouth.     ??? metoprolol tartrate (LOPRESSOR) 50 mg tablet Take 1 Tab by mouth two (2) times a day. 60 Tab 2   ??? LUTEIN-ZEAXANTHIN PO Take 1 Tab by mouth.         Past Medical History:   Diagnosis Date   ??? Arrhythmia     Atrial Fibrillation   ??? Arthritis    ??? CAD (coronary artery disease)    ??? Hypercholesterolemia     hypertriglycerides   ??? Hypertension         Past Surgical History:   Procedure Laterality Date   ??? HX APPENDECTOMY     ??? HX BREAST REDUCTION  1990   ??? HX CHOLECYSTECTOMY     ??? HX COLECTOMY     ??? HX COLECTOMY     ??? HX COLONOSCOPY     ??? HX GYN      hysterectomy   ??? HX PACEMAKER         Social History     Social History   ??? Marital status: MARRIED     Spouse name: N/A   ??? Number of children: N/A   ??? Years of education: N/A     Occupational History   ??? Not on file.     Social History Main Topics   ??? Smoking status: Former Smoker   ??? Smokeless tobacco: Never Used   ??? Alcohol use No   ??? Drug use: No   ??? Sexual activity: No     Other Topics Concern   ??? Not on file     Social History Narrative       Patient does not have an advanced  directive on file    Vitals:    09/26/17 1047   BP: 142/84   Pulse: (!) 102   Resp: 16   Temp: 98.7 ??F (37.1 ??C)   TempSrc: Tympanic   SpO2: 99%   Weight: 176 lb (79.8 kg)   Height: 5' 5"  (1.651 m)   PainSc:   1   PainLoc: Back       Physical Exam   Constitutional: No distress.   Cardiovascular: Normal rate.  An irregularly irregular rhythm present.   Pulmonary/Chest: Effort normal and breath sounds normal.   Musculoskeletal: Normal range of motion. She exhibits no edema, tenderness or deformity.   Neurological: She is alert.   Nursing note and vitals reviewed.      No visits with results within 3 Month(s) from this visit.  Latest known visit with results is:    Hospital Outpatient Visit on 06/11/2017   Component Date Value Ref Range Status   ??? WBC 06/11/2017 6.5  4.6 - 13.2 K/uL Final   ??? RBC 06/11/2017 4.69  4.20 - 5.30 M/uL Final   ??? HGB 06/11/2017 15.0  12.0 - 16.0 g/dL Final   ??? HCT 06/11/2017 45.3* 35.0 - 45.0 % Final   ??? MCV 06/11/2017 96.6  74.0 - 97.0 FL Final   ??? MCH 06/11/2017 32.0  24.0 - 34.0 PG Final   ??? MCHC 06/11/2017 33.1  31.0 - 37.0 g/dL Final   ??? RDW 06/11/2017 13.8  11.6 - 14.5 % Final   ??? PLATELET 06/11/2017 270  135 - 420 K/uL Final   ??? MPV 06/11/2017 11.2  9.2 - 11.8 FL Final    ??? NEUTROPHILS 06/11/2017 57  40 - 73 % Final   ??? LYMPHOCYTES 06/11/2017 32  21 - 52 % Final   ??? MONOCYTES 06/11/2017 10  3 - 10 % Final   ??? EOSINOPHILS 06/11/2017 1  0 - 5 % Final   ??? BASOPHILS 06/11/2017 0  0 - 2 % Final   ??? ABS. NEUTROPHILS 06/11/2017 3.7  1.8 - 8.0 K/UL Final   ??? ABS. LYMPHOCYTES 06/11/2017 2.1  0.9 - 3.6 K/UL Final   ??? ABS. MONOCYTES 06/11/2017 0.6  0.05 - 1.2 K/UL Final   ??? ABS. EOSINOPHILS 06/11/2017 0.1  0.0 - 0.4 K/UL Final   ??? ABS. BASOPHILS 06/11/2017 0.0  0.0 - 0.06 K/UL Final   ??? DF 06/11/2017 AUTOMATED    Final   ??? LIPID PROFILE 06/11/2017        Final   ??? Cholesterol, total 06/11/2017 161  <200 MG/DL Final   ??? Triglyceride 06/11/2017 119  <150 MG/DL Final    Comment: The drugs N-acetylcysteine (NAC) and  Metamiszole have been found to cause falsely  low results in this chemical assay. Please  be sure to submit blood samples obtained  BEFORE administration of either of these  drugs to assure correct results.     ??? HDL Cholesterol 06/11/2017 50  40 - 60 MG/DL Final   ??? LDL, calculated 06/11/2017 87.2  0 - 100 MG/DL Final   ??? VLDL, calculated 06/11/2017 23.8  MG/DL Final   ??? CHOL/HDL Ratio 06/11/2017 3.2  0 - 5.0   Final   ??? Sodium 06/11/2017 143  136 - 145 mmol/L Final   ??? Potassium 06/11/2017 4.1  3.5 - 5.5 mmol/L Final   ??? Chloride 06/11/2017 106  100 - 108 mmol/L Final   ??? CO2 06/11/2017 28  21 - 32 mmol/L Final   ???  Anion gap 06/11/2017 9  3.0 - 18 mmol/L Final   ??? Glucose 06/11/2017 80  74 - 99 mg/dL Final   ??? BUN 06/11/2017 11  7.0 - 18 MG/DL Final   ??? Creatinine 06/11/2017 0.76  0.6 - 1.3 MG/DL Final   ??? BUN/Creatinine ratio 06/11/2017 14  12 - 20   Final   ??? GFR est AA 06/11/2017 >60  >60 ml/min/1.53m Final   ??? GFR est non-AA 06/11/2017 >60  >60 ml/min/1.779mFinal    Comment: (NOTE)  Estimated GFR is calculated using the Modification of Diet in Renal   Disease (MDRD) Study equation, reported for both African Americans    (GFRAA) and non-African Americans (GFRNA), and normalized to 1.7372m body surface area. The physician must decide which value applies to   the patient. The MDRD study equation should only be used in   individuals age 7 32 older. It has not been validated for the   following: pregnant women, patients with serious comorbid conditions,   or on certain medications, or persons with extremes of body size,   muscle mass, or nutritional status.     ??? Calcium 06/11/2017 10.4* 8.5 - 10.1 MG/DL Final   ??? Bilirubin, total 06/11/2017 0.5  0.2 - 1.0 MG/DL Final   ??? ALT (SGPT) 06/11/2017 23  13 - 56 U/L Final   ??? AST (SGOT) 06/11/2017 18  15 - 37 U/L Final   ??? Alk. phosphatase 06/11/2017 61  45 - 117 U/L Final   ??? Protein, total 06/11/2017 7.2  6.4 - 8.2 g/dL Final   ??? Albumin 06/11/2017 3.7  3.4 - 5.0 g/dL Final   ??? Globulin 06/11/2017 3.5  2.0 - 4.0 g/dL Final   ??? A-G Ratio 06/11/2017 1.1  0.8 - 1.7   Final   ??? TSH 06/11/2017 1.25  0.36 - 3.74 uIU/mL Final       .No results found for any visits on 09/26/17.    Assessment / Plan:      ICD-10-CM ICD-9-CM    1. Acute right-sided thoracic back pain M54.6 724.1    2. Hypertension, unspecified type I10 401.9    3. Atrial fibrillation, unspecified type ([place].91 427.31    4. Frequency of urination R35.0 788.41 AMB POC URINALYSIS DIP STICK AUTO W/O MICRO   5. Encounter for immunization Z23 V03.89 INFLUENZA VACCINE INACTIVATED (IIV), SUBUNIT, ADJUVANTED, IM      ADMIN INFLUENZA VIRUS VAC     Acute right-sided thoracic back pain-probably muscular skeleton.  Okay to take Tylenol as needed next symptoms  Hypertension-mildly elevated blood pressure 142/84  Atrial fibrillation-patient reports her cardiologist increased her Eliquis from 2.5-5 mg twice a day  Patient reports signs of frequency-pock urine negative for UTI  Influenza vaccine administered today    Follow-up Disposition:  Return if symptoms worsen or fail to improve.     I asked the patient if she  had any questions and answered her  questions.  The patient stated that she understands the treatment plan and agrees with the treatment plan

## 2018-03-12 NOTE — Telephone Encounter (Signed)
Requested Prescriptions     Pending Prescriptions Disp Refills   ??? apixaban (ELIQUIS) 5 mg tablet 60 Tab 2     Sig: Take 1 Tab by mouth two (2) times a day.   ??? meclizine (ANTIVERT) 25 mg tablet 360 Tab 3     Sig: Take 1 Tab by mouth every six (6) hours as needed.   ??? metoprolol tartrate (LOPRESSOR) 25 mg tablet 60 Tab 2     Sig: Take 1 Tab by mouth two (2) times a day.   ??? metoprolol tartrate (LOPRESSOR) 50 mg tablet 60 Tab 2     Sig: Take 1 Tab by mouth two (2) times a day.   ??? fenofibrate (LOFIBRA) 160 mg tablet 30 Tab 2     Sig: Take 1 Tab by mouth daily.   ??? hydroCHLOROthiazide (HYDRODIURIL) 12.5 mg tablet 30 Tab 2     Sig: Take 1 Tab by mouth daily.     Needs prescription written

## 2018-03-13 MED ORDER — METOPROLOL TARTRATE 50 MG TAB
50 mg | ORAL_TABLET | Freq: Two times a day (BID) | ORAL | 1 refills | Status: DC
Start: 2018-03-13 — End: 2018-07-02

## 2018-03-13 MED ORDER — MECLIZINE 25 MG TAB
25 mg | ORAL_TABLET | Freq: Four times a day (QID) | ORAL | 1 refills | Status: DC | PRN
Start: 2018-03-13 — End: 2018-07-02

## 2018-03-13 MED ORDER — APIXABAN 5 MG TABLET
5 mg | ORAL_TABLET | Freq: Two times a day (BID) | ORAL | 1 refills | Status: DC
Start: 2018-03-13 — End: 2018-07-02

## 2018-03-13 MED ORDER — HYDROCHLOROTHIAZIDE 12.5 MG TAB
12.5 mg | ORAL_TABLET | Freq: Every day | ORAL | 1 refills | Status: DC
Start: 2018-03-13 — End: 2018-07-02

## 2018-03-13 MED ORDER — FENOFIBRATE 160 MG TAB
160 mg | ORAL_TABLET | Freq: Every day | ORAL | 1 refills | Status: DC
Start: 2018-03-13 — End: 2018-04-29

## 2018-03-13 MED ORDER — METOPROLOL TARTRATE 25 MG TAB
25 mg | ORAL_TABLET | Freq: Two times a day (BID) | ORAL | 2 refills | Status: DC
Start: 2018-03-13 — End: 2018-07-02

## 2018-04-22 ENCOUNTER — Other Ambulatory Visit: Admit: 2018-04-22 | Discharge: 2018-04-22 | Payer: MEDICARE | Primary: Internal Medicine

## 2018-04-22 ENCOUNTER — Inpatient Hospital Stay: Admit: 2018-04-22 | Payer: MEDICARE | Primary: Internal Medicine

## 2018-04-22 DIAGNOSIS — I10 Essential (primary) hypertension: Secondary | ICD-10-CM

## 2018-04-22 DIAGNOSIS — E781 Pure hyperglyceridemia: Secondary | ICD-10-CM | POA: Diagnosis not present

## 2018-04-22 LAB — CBC WITH AUTOMATED DIFF
ABS. BASOPHILS: 0 10*3/uL (ref 0.0–0.1)
ABS. EOSINOPHILS: 0.1 10*3/uL (ref 0.0–0.4)
ABS. LYMPHOCYTES: 1.8 10*3/uL (ref 0.9–3.6)
ABS. MONOCYTES: 0.6 10*3/uL (ref 0.05–1.2)
ABS. NEUTROPHILS: 3.7 10*3/uL (ref 1.8–8.0)
BASOPHILS: 0 % (ref 0–2)
EOSINOPHILS: 1 % (ref 0–5)
HCT: 47.7 % — ABNORMAL HIGH (ref 35.0–45.0)
HGB: 15.3 g/dL (ref 12.0–16.0)
LYMPHOCYTES: 29 % (ref 21–52)
MCH: 31.5 PG (ref 24.0–34.0)
MCHC: 32.1 g/dL (ref 31.0–37.0)
MCV: 98.4 FL — ABNORMAL HIGH (ref 74.0–97.0)
MONOCYTES: 9 % (ref 3–10)
MPV: 10.9 FL (ref 9.2–11.8)
NEUTROPHILS: 61 % (ref 40–73)
PLATELET: 227 10*3/uL (ref 135–420)
RBC: 4.85 M/uL (ref 4.20–5.30)
RDW: 13.7 % (ref 11.6–14.5)
WBC: 6.1 10*3/uL (ref 4.6–13.2)

## 2018-04-23 LAB — METABOLIC PANEL, BASIC
Anion gap: 7 mmol/L (ref 3.0–18)
BUN/Creatinine ratio: 16 (ref 12–20)
BUN: 10 MG/DL (ref 7.0–18)
CO2: 28 mmol/L (ref 21–32)
Calcium: 9.7 MG/DL (ref 8.5–10.1)
Chloride: 105 mmol/L (ref 100–108)
Creatinine: 0.63 MG/DL (ref 0.6–1.3)
GFR est AA: >60 mL/min/{1.73_m2} (ref >60)
GFR est non-AA: >60 mL/min/{1.73_m2} (ref >60)
Glucose: 84 mg/dL (ref 74–99)
Potassium: 4.1 mmol/L (ref 3.5–5.5)
Sodium: 140 mmol/L (ref 136–145)

## 2018-04-23 LAB — LIPID PANEL
CHOL/HDL Ratio: 3.7 (ref 0–5.0)
Cholesterol, total: 191 MG/DL (ref <200)
HDL Cholesterol: 52 MG/DL (ref 40–60)
LDL, calculated: 97.8 MG/DL (ref 0–100)
Triglyceride: 206 MG/DL — ABNORMAL HIGH (ref <150)
VLDL, calculated: 41.2 MG/DL

## 2018-04-29 ENCOUNTER — Ambulatory Visit: Admit: 2018-04-29 | Discharge: 2018-04-29 | Payer: MEDICARE | Attending: Family | Primary: Internal Medicine

## 2018-04-29 ENCOUNTER — Ambulatory Visit: Attending: Family | Primary: Internal Medicine

## 2018-04-29 DIAGNOSIS — L989 Disorder of the skin and subcutaneous tissue, unspecified: Secondary | ICD-10-CM

## 2018-04-29 DIAGNOSIS — Z6829 Body mass index (BMI) 29.0-29.9, adult: Secondary | ICD-10-CM | POA: Diagnosis not present

## 2018-04-29 DIAGNOSIS — I10 Essential (primary) hypertension: Secondary | ICD-10-CM | POA: Diagnosis not present

## 2018-04-29 DIAGNOSIS — Z0000 Encounter for general adult medical examination without abnormal findings: Secondary | ICD-10-CM | POA: Diagnosis not present

## 2018-04-29 DIAGNOSIS — Z1331 Encounter for screening for depression: Secondary | ICD-10-CM | POA: Diagnosis not present

## 2018-04-29 DIAGNOSIS — H543 Unqualified visual loss, both eyes: Secondary | ICD-10-CM | POA: Diagnosis not present

## 2018-04-29 DIAGNOSIS — E781 Pure hyperglyceridemia: Secondary | ICD-10-CM | POA: Diagnosis not present

## 2018-04-29 MED ORDER — FENOFIBRATE 160 MG TAB
160 mg | ORAL_TABLET | Freq: Every day | ORAL | 1 refills | Status: DC
Start: 2018-04-29 — End: 2018-04-29

## 2018-04-29 MED ORDER — FENOFIBRATE NANOCRYSTALLIZED 145 MG TAB
145 mg | ORAL_TABLET | Freq: Every day | ORAL | 1 refills | Status: DC
Start: 2018-04-29 — End: 2018-07-02

## 2018-04-29 NOTE — Progress Notes (Signed)
Emily Henson is a 84 y.o. female presenting today for Annual Wellness Visit  .       HPI:  Emily Henson presents to the office today for hypertension, hypertriglyceridemia, and vertigo.  Patient presents today without any complaints of pain or discomfort.  Patient blood pressure is controlled at 120/80.  Patient notes that she ran out of her fenofibrate and was so she needs a follow-up appointment prior to any refills.  Patient did have fasting labs done prior to today's visit and results reviewed with patient.  Patient notes that she continues to have intermittent vertigo.  She notes since her last visit she had one episode in which she used meclizine and it quickly resolved.  Patient is followed by cardiology for her permanent atrial fibrillation.  She notes she was seen by the cardiology in August 2018 he had increased her Eliquis from 2.5 mg twice daily to 5 mg twice daily.  Patient presents with no chest pain, palpitation or lower extremity edema.  Denies any cough, wheezing or congestion.  Is negative for abdominal pain, nausea, vomiting, diarrhea or constipation.  No headache or dizziness today.    Review of Systems   Eyes:        Legally blind     Respiratory: Negative for cough and sputum production.    Cardiovascular: Negative for chest pain and palpitations.   Gastrointestinal: Negative for abdominal pain, diarrhea, nausea and vomiting.   Genitourinary: Negative for dysuria.   Musculoskeletal: Negative for myalgias.   Neurological: Positive for dizziness (history of vertigo). Negative for focal weakness and headaches.       No Known Allergies    Current Outpatient Medications   Medication Sig Dispense Refill   ??? verapamil ER (VERELAN) 240 mg ER capsule Take 240 mg by mouth daily.     ??? fenofibrate nanocrystallized (TRICOR) 145 mg tablet Take 1 Tab by mouth daily. 90 Tab 1   ??? apixaban (ELIQUIS) 5 mg tablet Take 1 Tab by mouth two (2) times a day. 180 Tab 1    ??? metoprolol tartrate (LOPRESSOR) 25 mg tablet Take 1 Tab by mouth two (2) times a day. 60 Tab 2   ??? metoprolol tartrate (LOPRESSOR) 50 mg tablet Take 1 Tab by mouth two (2) times a day. 180 Tab 1   ??? hydroCHLOROthiazide (HYDRODIURIL) 12.5 mg tablet Take 1 Tab by mouth daily. 90 Tab 1   ??? cholecalciferol, vitamin D3, (VITAMIN D3) 2,000 unit tab Take 1 Tab by mouth daily. 30 Tab 2   ??? cyanocobalamin 1,000 mcg tablet Take 1 Tab by mouth daily. 30 Tab 2   ??? meclizine (ANTIVERT) 25 mg tablet Take 1 Tab by mouth every six (6) hours as needed for Dizziness. 120 Tab 1   ??? LUTEIN-ZEAXANTHIN PO Take 1 Tab by mouth.         Past Medical History:   Diagnosis Date   ??? Arrhythmia     Atrial Fibrillation   ??? Arthritis    ??? CAD (coronary artery disease)    ??? Hypercholesterolemia     hypertriglycerides   ??? Hypertension        Past Surgical History:   Procedure Laterality Date   ??? HX APPENDECTOMY     ??? HX BREAST REDUCTION  1990   ??? HX CHOLECYSTECTOMY     ??? HX COLECTOMY     ??? HX COLECTOMY     ??? HX COLONOSCOPY     ??? HX GYN  hysterectomy   ??? HX PACEMAKER         Social History     Socioeconomic History   ??? Marital status: MARRIED     Spouse name: Not on file   ??? Number of children: Not on file   ??? Years of education: Not on file   ??? Highest education level: Not on file   Occupational History   ??? Not on file   Social Needs   ??? Financial resource strain: Not on file   ??? Food insecurity:     Worry: Not on file     Inability: Not on file   ??? Transportation needs:     Medical: Not on file     Non-medical: Not on file   Tobacco Use   ??? Smoking status: Former Smoker   ??? Smokeless tobacco: Never Used   Substance and Sexual Activity   ??? Alcohol use: No   ??? Drug use: No   ??? Sexual activity: Never     Partners: Male   Lifestyle   ??? Physical activity:     Days per week: Not on file     Minutes per session: Not on file   ??? Stress: Not on file   Relationships   ??? Social connections:     Talks on phone: Not on file      Gets together: Not on file     Attends religious service: Not on file     Active member of club or organization: Not on file     Attends meetings of clubs or organizations: Not on file     Relationship status: Not on file   ??? Intimate partner violence:     Fear of current or ex partner: Not on file     Emotionally abused: Not on file     Physically abused: Not on file     Forced sexual activity: Not on file   Other Topics Concern   ??? Not on file   Social History Narrative   ??? Not on file       Patient does not have an advanced directive on file    Vitals:    04/29/18 0958   BP: 120/80   Pulse: 82   Resp: 16   Temp: 98.2 ??F (36.8 ??C)   TempSrc: Tympanic   SpO2: 99%   Weight: 177 lb 9.6 oz (80.6 kg)   Height:  (1.651 m)   PainSc:   0 - No pain       Physical Exam   Constitutional: No distress.   HENT:   Right Ear: External ear normal.   Left Ear: External ear normal.   Mouth/Throat: No oropharyngeal exudate.   Cardiovascular: Normal rate and normal heart sounds. An irregularly irregular rhythm present.   Pulmonary/Chest: Effort normal and breath sounds normal.   Musculoskeletal: She exhibits no edema or tenderness.   Neurological: She is alert.   Skin: Lesion noted.        Skin lesion to the forehead   Nursing note and vitals reviewed.      Hospital Outpatient Visit on 04/22/2018   Component Date Value Ref Range Status   ??? WBC 04/22/2018 6.1  4.6 - 13.2 K/uL Final   ??? RBC 04/22/2018 4.85  4.20 - 5.30 M/uL Final   ??? HGB 04/22/2018 15.3  12.0 - 16.0 g/dL Final   ??? HCT 16/09/9603 47.7* 35.0 - 45.0 % Final   ??? MCV 04/22/2018 98.4* 74.0 -  97.0 FL Final   ??? MCH 04/22/2018 31.5  24.0 - 34.0 PG Final   ??? MCHC 04/22/2018 32.1  31.0 - 37.0 g/dL Final   ??? RDW 16/09/9603 13.7  11.6 - 14.5 % Final   ??? PLATELET 04/22/2018 227  135 - 420 K/uL Final   ??? MPV 04/22/2018 10.9  9.2 - 11.8 FL Final   ??? NEUTROPHILS 04/22/2018 61  40 - 73 % Final   ??? LYMPHOCYTES 04/22/2018 29  21 - 52 % Final    ??? MONOCYTES 04/22/2018 9  3 - 10 % Final   ??? EOSINOPHILS 04/22/2018 1  0 - 5 % Final   ??? BASOPHILS 04/22/2018 0  0 - 2 % Final   ??? ABS. NEUTROPHILS 04/22/2018 3.7  1.8 - 8.0 K/UL Final   ??? ABS. LYMPHOCYTES 04/22/2018 1.8  0.9 - 3.6 K/UL Final   ??? ABS. MONOCYTES 04/22/2018 0.6  0.05 - 1.2 K/UL Final   ??? ABS. EOSINOPHILS 04/22/2018 0.1  0.0 - 0.4 K/UL Final   ??? ABS. BASOPHILS 04/22/2018 0.0  0.0 - 0.1 K/UL Final   ??? DF 04/22/2018 AUTOMATED    Final   ??? Sodium 04/22/2018 140  136 - 145 mmol/L Final   ??? Potassium 04/22/2018 4.1  3.5 - 5.5 mmol/L Final   ??? Chloride 04/22/2018 105  100 - 108 mmol/L Final   ??? CO2 04/22/2018 28  21 - 32 mmol/L Final   ??? Anion gap 04/22/2018 7  3.0 - 18 mmol/L Final   ??? Glucose 04/22/2018 84  74 - 99 mg/dL Final   ??? BUN 54/08/8118 10  7.0 - 18 MG/DL Final   ??? Creatinine 04/22/2018 0.63  0.6 - 1.3 MG/DL Final   ??? BUN/Creatinine ratio 04/22/2018 16  12 - 20   Final   ??? GFR est AA 04/22/2018 >60  >60 ml/min/1.66m2 Final   ??? GFR est non-AA 04/22/2018 >60  >60 ml/min/1.56m2 Final    Comment: (NOTE)  Estimated GFR is calculated using the Modification of Diet in Renal   Disease (MDRD) Study equation, reported for both African Americans   (GFRAA) and non-African Americans (GFRNA), and normalized to 1.5m2   body surface area. The physician must decide which value applies to   the patient. The MDRD study equation should only be used in   individuals age 34 or older. It has not been validated for the   following: pregnant women, patients with serious comorbid conditions,   or on certain medications, or persons with extremes of body size,   muscle mass, or nutritional status.     ??? Calcium 04/22/2018 9.7  8.5 - 10.1 MG/DL Final   ??? LIPID PROFILE 04/22/2018        Final   ??? Cholesterol, total 04/22/2018 191  <200 MG/DL Final   ??? Triglyceride 04/22/2018 206* <150 MG/DL Final    Comment: The drugs N-acetylcysteine (NAC) and  Metamiszole have been found to cause falsely   low results in this chemical assay. Please  be sure to submit blood samples obtained  BEFORE administration of either of these  drugs to assure correct results.     ??? HDL Cholesterol 04/22/2018 52  40 - 60 MG/DL Final   ??? LDL, calculated 04/22/2018 97.8  0 - 100 MG/DL Final   ??? VLDL, calculated 04/22/2018 41.2  MG/DL Final   ??? CHOL/HDL Ratio 04/22/2018 3.7  0 - 5.0   Final       .No results found for  any visits on 04/29/18.    Assessment / Plan:      ICD-10-CM ICD-9-CM    1. Skin lesion L98.9 709.9 REFERRAL TO DERMATOLOGY   2. Hypertriglyceridemia E78.1 272.1 fenofibrate nanocrystallized (TRICOR) 145 mg tablet      DISCONTINUED: fenofibrate (LOFIBRA) 160 mg tablet   3. Hypertension, unspecified type I10 401.9    4. 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  Continue current treatment plan  Hypertriglyceridemia-patient triglycerides remains elevated.  Lipid panel ordered on Apr 22, 2018 and triglycerides slightly elevated at 206.  Refill fenofibrate at 145 mg tablets  Patient had skin lesion to her forehead.  Skin lesion with pigment changes.  Referred to dermatology for evaluation  Patient has a history of blindness in both eyes.  Patient son helps her manages her medication  Follow-up in August.  Patient is planning to move to [place] would like to see patient prior to leaving.      Follow-up and Dispositions    ?? Return in about 3 months (around 07/30/2018).         I asked the patient if she  had any questions and answered her  questions.  The patient stated that she understands the treatment plan and agrees with the treatment plan    This document was created with a voice activated dictation system and may contain transcription errors.     This is the Subsequent Medicare Annual Wellness Exam, performed 12 months or more after the Initial AWV or the last Subsequent AWV    I have reviewed the patient's medical history in detail and updated the computerized patient record.      History     Past Medical History:   Diagnosis Date   ??? Arrhythmia     Atrial Fibrillation   ??? Arthritis    ??? CAD (coronary artery disease)    ??? Hypercholesterolemia     hypertriglycerides   ??? Hypertension       Past Surgical History:   Procedure Laterality Date   ??? HX APPENDECTOMY     ??? HX BREAST REDUCTION  1990   ??? HX CHOLECYSTECTOMY     ??? HX COLECTOMY     ??? HX COLECTOMY     ??? HX COLONOSCOPY     ??? HX GYN      hysterectomy   ??? HX PACEMAKER       Current Outpatient Medications   Medication Sig Dispense Refill   ??? verapamil ER (VERELAN) 240 mg ER capsule Take 240 mg by mouth daily.     ??? fenofibrate nanocrystallized (TRICOR) 145 mg tablet Take 1 Tab by mouth daily. 90 Tab 1   ??? apixaban (ELIQUIS) 5 mg tablet Take 1 Tab by mouth two (2) times a day. 180 Tab 1   ??? metoprolol tartrate (LOPRESSOR) 25 mg tablet Take 1 Tab by mouth two (2) times a day. 60 Tab 2   ??? metoprolol tartrate (LOPRESSOR) 50 mg tablet Take 1 Tab by mouth two (2) times a day. 180 Tab 1   ??? hydroCHLOROthiazide (HYDRODIURIL) 12.5 mg tablet Take 1 Tab by mouth daily. 90 Tab 1   ??? cholecalciferol, vitamin D3, (VITAMIN D3) 2,000 unit tab Take 1 Tab by mouth daily. 30 Tab 2   ??? cyanocobalamin 1,000 mcg tablet Take 1 Tab by mouth daily. 30 Tab 2   ??? meclizine (ANTIVERT) 25 mg tablet Take 1 Tab by mouth every six (6) hours as  needed for Dizziness. 120 Tab 1   ??? LUTEIN-ZEAXANTHIN PO Take 1 Tab by mouth.       No Known Allergies  Family History   Problem Relation Age of Onset   ??? Diabetes Mother    ??? Hypertension Mother      Social History     Tobacco Use   ??? Smoking status: Former Smoker   ??? Smokeless tobacco: Never Used   Substance Use Topics   ??? Alcohol use: No     Patient Active Problem List   Diagnosis Code   ??? 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 Atrial fibrillation (HCC) I48.91   ??? Pacemaker Z95.0   ??? Hypertension I10   ??? Hypertriglyceridemia E78.1       Depression Risk Factor Screening:     3 most recent PHQ Screens 04/29/2018    Little interest or pleasure in doing things Not at all   Feeling down, depressed, irritable, or hopeless Not at all   Total Score PHQ 2 0     Alcohol Risk Factor Screening:   You do not drink alcohol or very rarely.    Functional Ability and Level of Safety:   Hearing Loss  Hearing is good.    Activities of Daily Living  The home contains: handrails  Patient needs help with:  transportation, shopping, preparing meals, laundry and managing medications    Fall Risk  Fall Risk Assessment, last 12 mths 04/29/2018   Able to walk? Yes   Fall in past 12 months? No       Abuse Screen  Patient is not abused    Cognitive Screening   Evaluation of Cognitive Function:  Has your family/caregiver stated any concerns about your memory: no  Normal    Patient Care Team   Patient Care Team:  Paula Libra, NP as PCP - General (Nurse Practitioner)    Assessment/Plan   Education and counseling provided:  Are appropriate based on today's review and evaluation    Diagnoses and all orders for this visit:    1. Skin lesion  -     REFERRAL TO DERMATOLOGY    2. Hypertriglyceridemia  -     fenofibrate nanocrystallized (TRICOR) 145 mg tablet; Take 1 Tab by mouth daily.    3. Hypertension, unspecified type    4. Blind in both eyes        Health Maintenance Due   Topic Date Due   ??? Shingrix Vaccine Age 31> (1 of 2) 10/01/1985   ??? GLAUCOMA SCREENING Q2Y  10/01/2000   ??? Bone Densitometry (Dexa) Screening  10/01/2000   ??? MEDICARE YEARLY EXAM  09/30/2017       Discussed the patient's BMI with her.  The BMI follow up plan is as follows:     dietary management education, guidance, and counseling  encourage exercise  monitor weight  prescribed dietary intake    An After Visit Summary was printed and given to the patient.

## 2018-04-29 NOTE — Patient Instructions (Signed)
Body Mass Index: Care Instructions  Your Care Instructions    Body mass index (BMI) can help you see if your weight is raising your risk for health problems. It uses a formula to compare how much you weigh with how tall you are.  ?? A BMI lower than 18.5 is considered underweight.  ?? A BMI between 18.5 and 24.9 is considered healthy.  ?? A BMI between 25 and 29.9 is considered overweight. A BMI of 30 or higher is considered obese.  If your BMI is in the normal range, it means that you have a lower risk for weight-related health problems. If your BMI is in the overweight or obese range, you may be at increased risk for weight-related health problems, such as high blood pressure, heart disease, stroke, arthritis or joint pain, and diabetes. If your BMI is in the underweight range, you may be at increased risk for health problems such as fatigue, lower protection (immunity) against illness, muscle loss, bone loss, hair loss, and hormone problems.  BMI is just one measure of your risk for weight-related health problems. You may be at higher risk for health problems if you are not active, you eat an unhealthy diet, or you drink too much alcohol or use tobacco products.  Follow-up care is a key part of your treatment and safety. Be sure to make and go to all appointments, and call your doctor if you are having problems. It's also a good idea to know your test results and keep a list of the medicines you take.  How can you care for yourself at home?  ?? Practice healthy eating habits. This includes eating plenty of fruits, vegetables, whole grains, lean protein, and low-fat dairy.  ?? If your doctor recommends it, get more exercise. Walking is a good choice. Bit by bit, increase the amount you walk every day. Try for at least 30 minutes on most days of the week.  ?? Do not smoke. Smoking can increase your risk for health problems. If you need help quitting, talk to your doctor about stop-smoking programs and  medicines. These can increase your chances of quitting for good.  ?? Limit alcohol to 2 drinks a day for men and 1 drink a day for women. Too much alcohol can cause health problems.  If you have a BMI higher than 25  ?? Your doctor may do other tests to check your risk for weight-related health problems. This may include measuring the distance around your waist. A waist measurement of more than 40 inches in men or 35 inches in women can increase the risk of weight-related health problems.  ?? Talk with your doctor about steps you can take to stay healthy or improve your health. You may need to make lifestyle changes to lose weight and stay healthy, such as changing your diet and getting regular exercise.  If you have a BMI lower than 18.5  ?? Your doctor may do other tests to check your risk for health problems.  ?? Talk with your doctor about steps you can take to stay healthy or improve your health. You may need to make lifestyle changes to gain or maintain weight and stay healthy, such as getting more healthy foods in your diet and doing exercises to build muscle.  Where can you learn more?  Go to http://www.healthwise.net/[employer].  Enter S176 in the search box to learn more about "Body Mass Index: Care Instructions."  Current as of: September 30, 2015  Content Version: 11.4  ??   2006-2017 [employer]. Care instructions adapted under license by [employer] (which disclaims liability or warranty for this information). If you have questions about a medical condition or this instruction, always ask your healthcare professional. [employer] Inc[employer] disclaims any warranty or liability for your use of this information.

## 2018-06-18 ENCOUNTER — Encounter: Attending: Family | Primary: Internal Medicine

## 2018-07-02 ENCOUNTER — Ambulatory Visit: Attending: Family | Primary: Internal Medicine

## 2018-07-02 ENCOUNTER — Ambulatory Visit: Admit: 2018-07-02 | Discharge: 2018-07-02 | Payer: MEDICARE | Attending: Family | Primary: Internal Medicine

## 2018-07-02 DIAGNOSIS — I10 Essential (primary) hypertension: Secondary | ICD-10-CM

## 2018-07-02 DIAGNOSIS — Z6829 Body mass index (BMI) 29.0-29.9, adult: Secondary | ICD-10-CM | POA: Diagnosis not present

## 2018-07-02 DIAGNOSIS — R42 Dizziness and giddiness: Secondary | ICD-10-CM | POA: Diagnosis not present

## 2018-07-02 DIAGNOSIS — I4891 Unspecified atrial fibrillation: Secondary | ICD-10-CM | POA: Diagnosis not present

## 2018-07-02 DIAGNOSIS — E781 Pure hyperglyceridemia: Secondary | ICD-10-CM | POA: Diagnosis not present

## 2018-07-02 MED ORDER — APIXABAN 5 MG TABLET
5 mg | ORAL_TABLET | Freq: Two times a day (BID) | ORAL | 1 refills | Status: DC
Start: 2018-07-02 — End: 2018-07-04

## 2018-07-02 MED ORDER — MECLIZINE 25 MG TAB
25 mg | ORAL_TABLET | Freq: Four times a day (QID) | ORAL | 1 refills | Status: DC | PRN
Start: 2018-07-02 — End: 2018-07-04

## 2018-07-02 MED ORDER — HYDROCHLOROTHIAZIDE 12.5 MG TAB
12.5 mg | ORAL_TABLET | Freq: Every day | ORAL | 1 refills | Status: DC
Start: 2018-07-02 — End: 2018-07-04

## 2018-07-02 MED ORDER — METOPROLOL TARTRATE 50 MG TAB
50 mg | ORAL_TABLET | Freq: Two times a day (BID) | ORAL | 1 refills | Status: DC
Start: 2018-07-02 — End: 2018-07-04

## 2018-07-02 MED ORDER — METOPROLOL TARTRATE 25 MG TAB
25 mg | ORAL_TABLET | Freq: Two times a day (BID) | ORAL | 2 refills | Status: DC
Start: 2018-07-02 — End: 2018-07-04

## 2018-07-02 MED ORDER — FENOFIBRATE NANOCRYSTALLIZED 145 MG TAB
145 mg | ORAL_TABLET | Freq: Every day | ORAL | 1 refills | Status: DC
Start: 2018-07-02 — End: 2018-07-04

## 2018-07-02 NOTE — Progress Notes (Signed)
 Emily Henson is a 84 y.o. female presents in office for    Chief Complaint   Patient presents with   . Medication Evaluation        Visit Vitals  BP 118/86 (BP 1 Location: Left arm, BP Patient Position: Sitting)   Pulse 71   Temp 97.7 F (36.5 C) (Tympanic)   Resp 16   Ht 5' 5 (1.651 m)   Wt 179 lb (81.2 kg)   SpO2 98%   BMI 29.79 kg/m         Health Maintenance Due   Topic Date Due   . Shingrix Vaccine Age 30> (1 of 2) 10/01/1985   . GLAUCOMA SCREENING Q2Y  10/01/2000   . Bone Densitometry (Dexa) Screening  10/01/2000   . BREAST CANCER SCRN MAMMOGRAM  10/05/2017         1. Have you been to the ER, urgent care clinic since your last visit?  Hospitalized since your last visit?No    2. Have you seen or consulted any other health care providers outside of the [facility] since your last visit?  Include any pap smears or colon screening. No     Learning Assessment 08/21/2017   PRIMARY LEARNER Patient   HIGHEST LEVEL OF EDUCATION - PRIMARY LEARNER  GRADUATED HIGH SCHOOL OR GED   BARRIERS PRIMARY LEARNER VISUAL   CO-LEARNER CAREGIVER No   PRIMARY LANGUAGE ENGLISH   LEARNER PREFERENCE PRIMARY OTHER (COMMENT)   ANSWERED BY patient   RELATIONSHIP SELF

## 2018-07-02 NOTE — Progress Notes (Signed)
Emily PriestMattie Henson is a 84 y.o. female presenting today for Medication Evaluation    HPI:  Emily CaneMattie Henson presents to the office today for hypertension and hyperlipidemia follow-up care.  Patient presents today stating she feels well.  She denies any chest pain, palpitation lower extremity edema.  Patient notes that she is scheduled to move to the [place] area and would like refills on her medications.  She reports she is not sure of her move day.  Patient also presents today with a mid forehead lesion.  She was previously referred to dermatology in May but has not received a phone call.  She denies any pain to the lesion.  Patient has a history of hypertension and notes that she is compliant with taking her medicines every day.  She is negative for any chest pain, palpitation or lower extremity edema.  She reports she does not have any more metoprolol 25 mg tablet and is requesting a refill of medication.  She reports she takes metoprolol 75 mg daily.    Review of Systems   Respiratory: Negative for cough.    Cardiovascular: Negative for chest pain, palpitations and leg swelling.   Gastrointestinal: Negative for abdominal pain, constipation and diarrhea.   Musculoskeletal: Negative for back pain and myalgias.   Neurological: Negative for dizziness and headaches.       No Known Allergies    Current Outpatient Medications   Medication Sig Dispense Refill   ??? metoprolol tartrate (LOPRESSOR) 25 mg tablet Take 1 Tab by mouth two (2) times a day. 60 Tab 2   ??? fenofibrate nanocrystallized (TRICOR) 145 mg tablet Take 1 Tab by mouth daily. 90 Tab 1   ??? hydroCHLOROthiazide (HYDRODIURIL) 12.5 mg tablet Take 1 Tab by mouth daily. 90 Tab 1   ??? meclizine (ANTIVERT) 25 mg tablet Take 1 Tab by mouth every six (6) hours as needed for Dizziness. 120 Tab 1   ??? metoprolol tartrate (LOPRESSOR) 50 mg tablet Take 1 Tab by mouth two (2) times a day. 180 Tab 1   ??? apixaban (ELIQUIS) 5 mg tablet Take 1 Tab by mouth two (2) times a day.  180 Tab 1   ??? verapamil ER (VERELAN) 240 mg ER capsule Take 240 mg by mouth daily.     ??? cholecalciferol, vitamin D3, (VITAMIN D3) 2,000 unit tab Take 1 Tab by mouth daily. 30 Tab 2   ??? cyanocobalamin 1,000 mcg tablet Take 1 Tab by mouth daily. 30 Tab 2       Past Medical History:   Diagnosis Date   ??? Arrhythmia     Atrial Fibrillation   ??? Arthritis    ??? CAD (coronary artery disease)    ??? Hypercholesterolemia     hypertriglycerides   ??? Hypertension        Past Surgical History:   Procedure Laterality Date   ??? HX APPENDECTOMY     ??? HX BREAST REDUCTION  1990   ??? HX CHOLECYSTECTOMY     ??? HX COLECTOMY     ??? HX COLECTOMY     ??? HX COLONOSCOPY     ??? HX GYN      hysterectomy   ??? HX PACEMAKER         Social History     Socioeconomic History   ??? Marital status: MARRIED     Spouse name: Not on file   ??? Number of children: Not on file   ??? Years of education: Not on file   ???  Highest education level: Not on file   Occupational History   ??? Not on file   Social Needs   ??? Financial resource strain: Not on file   ??? Food insecurity:     Worry: Not on file     Inability: Not on file   ??? Transportation needs:     Medical: Not on file     Non-medical: Not on file   Tobacco Use   ??? Smoking status: Former Smoker   ??? Smokeless tobacco: Never Used   Substance and Sexual Activity   ??? Alcohol use: No   ??? Drug use: No   ??? Sexual activity: Never     Partners: Male   Lifestyle   ??? Physical activity:     Days per week: Not on file     Minutes per session: Not on file   ??? Stress: Not on file   Relationships   ??? Social connections:     Talks on phone: Not on file     Gets together: Not on file     Attends religious service: Not on file     Active member of club or organization: Not on file     Attends meetings of clubs or organizations: Not on file     Relationship status: Not on file   ??? Intimate partner violence:     Fear of current or ex partner: Not on file     Emotionally abused: Not on file     Physically abused: Not on file      Forced sexual activity: Not on file   Other Topics Concern   ??? Not on file   Social History Narrative   ??? Not on file       Patient does not have an advanced directive on file    Vitals:    07/02/18 0805   BP: 118/86   Pulse: 71   Resp: 16   Temp: 97.7 ??F (36.5 ??C)   TempSrc: Tympanic   SpO2: 98%   Weight: 179 lb (81.2 kg)   Height: 5\' 5"  (1.651 m)   PainSc:   0 - No pain       Physical Exam   Constitutional: She is oriented to person, place, and time. No distress.   Cardiovascular: Normal rate, regular rhythm and normal heart sounds.   Pulmonary/Chest: Effort normal and breath sounds normal.   Lymphadenopathy:     She has no cervical adenopathy.   Neurological: She is alert and oriented to person, place, and time.   Skin: Skin is warm.   Nursing note and vitals reviewed.      Hospital Outpatient Visit on 04/22/2018   Component Date Value Ref Range Status   ??? WBC 04/22/2018 6.1  4.6 - 13.2 K/uL Final   ??? RBC 04/22/2018 4.85  4.20 - 5.30 M/uL Final   ??? HGB 04/22/2018 15.3  12.0 - 16.0 g/dL Final   ??? HCT 84/13/2440 47.7* 35.0 - 45.0 % Final   ??? MCV 04/22/2018 98.4* 74.0 - 97.0 FL Final   ??? MCH 04/22/2018 31.5  24.0 - 34.0 PG Final   ??? MCHC 04/22/2018 32.1  31.0 - 37.0 g/dL Final   ??? RDW 10/14/2535 13.7  11.6 - 14.5 % Final   ??? PLATELET 04/22/2018 227  135 - 420 K/uL Final   ??? MPV 04/22/2018 10.9  9.2 - 11.8 FL Final   ??? NEUTROPHILS 04/22/2018 61  40 - 73 % Final   ???  LYMPHOCYTES 04/22/2018 29  21 - 52 % Final   ??? MONOCYTES 04/22/2018 9  3 - 10 % Final   ??? EOSINOPHILS 04/22/2018 1  0 - 5 % Final   ??? BASOPHILS 04/22/2018 0  0 - 2 % Final   ??? ABS. NEUTROPHILS 04/22/2018 3.7  1.8 - 8.0 K/UL Final   ??? ABS. LYMPHOCYTES 04/22/2018 1.8  0.9 - 3.6 K/UL Final   ??? ABS. MONOCYTES 04/22/2018 0.6  0.05 - 1.2 K/UL Final   ??? ABS. EOSINOPHILS 04/22/2018 0.1  0.0 - 0.4 K/UL Final   ??? ABS. BASOPHILS 04/22/2018 0.0  0.0 - 0.1 K/UL Final   ??? DF 04/22/2018 AUTOMATED    Final   ??? Sodium 04/22/2018 140  136 - 145 mmol/L Final    ??? Potassium 04/22/2018 4.1  3.5 - 5.5 mmol/L Final   ??? Chloride 04/22/2018 105  100 - 108 mmol/L Final   ??? CO2 04/22/2018 28  21 - 32 mmol/L Final   ??? Anion gap 04/22/2018 7  3.0 - 18 mmol/L Final   ??? Glucose 04/22/2018 84  74 - 99 mg/dL Final   ??? BUN 16/09/9603 10  7.0 - 18 MG/DL Final   ??? Creatinine 04/22/2018 0.63  0.6 - 1.3 MG/DL Final   ??? BUN/Creatinine ratio 04/22/2018 16  12 - 20   Final   ??? GFR est AA 04/22/2018 >60  >60 ml/min/1.60m2 Final   ??? GFR est non-AA 04/22/2018 >60  >60 ml/min/1.21m2 Final    Comment: (NOTE)  Estimated GFR is calculated using the Modification of Diet in Renal   Disease (MDRD) Study equation, reported for both African Americans   (GFRAA) and non-African Americans (GFRNA), and normalized to 1.40m2   body surface area. The physician must decide which value applies to   the patient. The MDRD study equation should only be used in   individuals age 74 or older. It has not been validated for the   following: pregnant women, patients with serious comorbid conditions,   or on certain medications, or persons with extremes of body size,   muscle mass, or nutritional status.     ??? Calcium 04/22/2018 9.7  8.5 - 10.1 MG/DL Final   ??? LIPID PROFILE 04/22/2018        Final   ??? Cholesterol, total 04/22/2018 191  <200 MG/DL Final   ??? Triglyceride 04/22/2018 206* <150 MG/DL Final    Comment: The drugs N-acetylcysteine (NAC) and  Metamiszole have been found to cause falsely  low results in this chemical assay. Please  be sure to submit blood samples obtained  BEFORE administration of either of these  drugs to assure correct results.     ??? HDL Cholesterol 04/22/2018 52  40 - 60 MG/DL Final   ??? LDL, calculated 04/22/2018 97.8  0 - 100 MG/DL Final   ??? VLDL, calculated 04/22/2018 41.2  MG/DL Final   ??? CHOL/HDL Ratio 04/22/2018 3.7  0 - 5.0   Final       .No results found for any visits on 07/02/18.    Assessment / Plan:      ICD-10-CM ICD-9-CM     1. Hypertension, unspecified type I10 401.9 metoprolol tartrate (LOPRESSOR) 25 mg tablet      hydroCHLOROthiazide (HYDRODIURIL) 12.5 mg tablet      metoprolol tartrate (LOPRESSOR) 50 mg tablet   2. Hypertriglyceridemia E78.1 272.1 fenofibrate nanocrystallized (TRICOR) 145 mg tablet   3. Atrial fibrillation, unspecified type (HCC) I48.91 427.31 apixaban (ELIQUIS) 5  mg tablet   4. Dizziness R42 780.4 meclizine (ANTIVERT) 25 mg tablet     Patient informed that Dr. Burnadette PeterLynch, cardiologist office is attempting to reschedule her appointment.   Refill metoprolol 1 fenofibrate, Eliquis and Antivert  Fasting labs next visit  Hypertension-blood pressure controlled  Follow-up in 4 months    Follow-up and Dispositions    ?? Return in about 4 months (around 11/02/2018).         I asked the patient if she  had any questions and answered her  questions.  The patient stated that she understands the treatment plan and agrees with the treatment plan    This document was created with a voice activated dictation system and may contain transcription errors.

## 2018-07-02 NOTE — Progress Notes (Signed)
Emily Henson is a 84 y.o. female presents in office for    Chief Complaint   Patient presents with   ??? Medication Evaluation        Visit Vitals  BP 118/86 (BP 1 Location: Left arm, BP Patient Position: Sitting)   Pulse 71   Temp 97.7 ??F (36.5 ??C) (Tympanic)   Resp 16   Ht 5' 5" (1.651 m)   Wt 179 lb (81.2 kg)   SpO2 98%   BMI 29.79 kg/m??         Health Maintenance Due   Topic Date Due   ??? Shingrix Vaccine Age 50> (1 of 2) 10/01/1985   ??? GLAUCOMA SCREENING Q2Y  10/01/2000   ??? Bone Densitometry (Dexa) Screening  10/01/2000   ??? BREAST CANCER SCRN MAMMOGRAM  10/05/2017         1. Have you been to the ER, urgent care clinic since your last visit?  Hospitalized since your last visit?No    2. Have you seen or consulted any other health care providers outside of the [facility] since your last visit?  Include any pap smears or colon screening. No     Learning Assessment 08/21/2017   PRIMARY LEARNER Patient   HIGHEST LEVEL OF EDUCATION - PRIMARY LEARNER  GRADUATED HIGH SCHOOL OR GED   BARRIERS PRIMARY LEARNER VISUAL   CO-LEARNER CAREGIVER No   PRIMARY LANGUAGE ENGLISH   LEARNER PREFERENCE PRIMARY OTHER (COMMENT)   ANSWERED BY patient   RELATIONSHIP SELF

## 2018-07-02 NOTE — Progress Notes (Signed)
Emily Henson is a 84 y.o. female presenting today for Medication Evaluation    HPI:  Emily Henson presents to the office today for hypertension and hyperlipidemia follow-up care.  Patient presents today stating she feels well.  She denies any chest pain, palpitation lower extremity edema.  Patient notes that she is scheduled to move to the [place] area and would like refills on her medications.  She reports she is not sure of her move day.  Patient also presents today with a mid forehead lesion.  She was previously referred to dermatology in May but has not received a phone call.  She denies any pain to the lesion.  Patient has a history of hypertension and notes that she is compliant with taking her medicines every day.  She is negative for any chest pain, palpitation or lower extremity edema.  She reports she does not have any more metoprolol 25 mg tablet and is requesting a refill of medication.  She reports she takes metoprolol 75 mg daily.    Review of Systems   Respiratory: Negative for cough.    Cardiovascular: Negative for chest pain, palpitations and leg swelling.   Gastrointestinal: Negative for abdominal pain, constipation and diarrhea.   Musculoskeletal: Negative for back pain and myalgias.   Neurological: Negative for dizziness and headaches.       No Known Allergies    Current Outpatient Medications   Medication Sig Dispense Refill   ??? metoprolol tartrate (LOPRESSOR) 25 mg tablet Take 1 Tab by mouth two (2) times a day. 60 Tab 2   ??? fenofibrate nanocrystallized (TRICOR) 145 mg tablet Take 1 Tab by mouth daily. 90 Tab 1   ??? hydroCHLOROthiazide (HYDRODIURIL) 12.5 mg tablet Take 1 Tab by mouth daily. 90 Tab 1   ??? meclizine (ANTIVERT) 25 mg tablet Take 1 Tab by mouth every six (6) hours as needed for Dizziness. 120 Tab 1   ??? metoprolol tartrate (LOPRESSOR) 50 mg tablet Take 1 Tab by mouth two (2) times a day. 180 Tab 1   ??? apixaban (ELIQUIS) 5 mg tablet Take 1 Tab by mouth two (2) times a day. 180 Tab  1   ??? verapamil ER (VERELAN) 240 mg ER capsule Take 240 mg by mouth daily.     ??? cholecalciferol, vitamin D3, (VITAMIN D3) 2,000 unit tab Take 1 Tab by mouth daily. 30 Tab 2   ??? cyanocobalamin 1,000 mcg tablet Take 1 Tab by mouth daily. 30 Tab 2       Past Medical History:   Diagnosis Date   ??? Arrhythmia     Atrial Fibrillation   ??? Arthritis    ??? CAD (coronary artery disease)    ??? Hypercholesterolemia     hypertriglycerides   ??? Hypertension        Past Surgical History:   Procedure Laterality Date   ??? HX APPENDECTOMY     ??? HX BREAST REDUCTION  1990   ??? HX CHOLECYSTECTOMY     ??? HX COLECTOMY     ??? HX COLECTOMY     ??? HX COLONOSCOPY     ??? HX GYN      hysterectomy   ??? HX PACEMAKER         Social History     Socioeconomic History   ??? Marital status: MARRIED     Spouse name: Not on file   ??? Number of children: Not on file   ??? Years of education: Not on file   ???  Highest education level: Not on file   Occupational History   ??? Not on file   Social Needs   ??? Financial resource strain: Not on file   ??? Food insecurity:     Worry: Not on file     Inability: Not on file   ??? Transportation needs:     Medical: Not on file     Non-medical: Not on file   Tobacco Use   ??? Smoking status: Former Smoker   ??? Smokeless tobacco: Never Used   Substance and Sexual Activity   ??? Alcohol use: No   ??? Drug use: No   ??? Sexual activity: Never     Partners: Male   Lifestyle   ??? Physical activity:     Days per week: Not on file     Minutes per session: Not on file   ??? Stress: Not on file   Relationships   ??? Social connections:     Talks on phone: Not on file     Gets together: Not on file     Attends religious service: Not on file     Active member of club or organization: Not on file     Attends meetings of clubs or organizations: Not on file     Relationship status: Not on file   ??? Intimate partner violence:     Fear of current or ex partner: Not on file     Emotionally abused: Not on file     Physically abused: Not on file     Forced sexual  activity: Not on file   Other Topics Concern   ??? Not on file   Social History Narrative   ??? Not on file       Patient does not have an advanced directive on file    Vitals:    07/02/18 0805   BP: 118/86   Pulse: 71   Resp: 16   Temp: 97.7 ??F (36.5 ??C)   TempSrc: Tympanic   SpO2: 98%   Weight: 179 lb (81.2 kg)   Height: 5\' 5"  (1.651 m)   PainSc:   0 - No pain       Physical Exam   Constitutional: She is oriented to person, place, and time. No distress.   Cardiovascular: Normal rate, regular rhythm and normal heart sounds.   Pulmonary/Chest: Effort normal and breath sounds normal.   Lymphadenopathy:     She has no cervical adenopathy.   Neurological: She is alert and oriented to person, place, and time.   Skin: Skin is warm.   Nursing note and vitals reviewed.      Hospital Outpatient Visit on 04/22/2018   Component Date Value Ref Range Status   ??? WBC 04/22/2018 6.1  4.6 - 13.2 K/uL Final   ??? RBC 04/22/2018 4.85  4.20 - 5.30 M/uL Final   ??? HGB 04/22/2018 15.3  12.0 - 16.0 g/dL Final   ??? HCT 98/10/9146 47.7* 35.0 - 45.0 % Final   ??? MCV 04/22/2018 98.4* 74.0 - 97.0 FL Final   ??? MCH 04/22/2018 31.5  24.0 - 34.0 PG Final   ??? MCHC 04/22/2018 32.1  31.0 - 37.0 g/dL Final   ??? RDW 82/95/6213 13.7  11.6 - 14.5 % Final   ??? PLATELET 04/22/2018 227  135 - 420 K/uL Final   ??? MPV 04/22/2018 10.9  9.2 - 11.8 FL Final   ??? NEUTROPHILS 04/22/2018 61  40 - 73 % Final   ???  LYMPHOCYTES 04/22/2018 29  21 - 52 % Final   ??? MONOCYTES 04/22/2018 9  3 - 10 % Final   ??? EOSINOPHILS 04/22/2018 1  0 - 5 % Final   ??? BASOPHILS 04/22/2018 0  0 - 2 % Final   ??? ABS. NEUTROPHILS 04/22/2018 3.7  1.8 - 8.0 K/UL Final   ??? ABS. LYMPHOCYTES 04/22/2018 1.8  0.9 - 3.6 K/UL Final   ??? ABS. MONOCYTES 04/22/2018 0.6  0.05 - 1.2 K/UL Final   ??? ABS. EOSINOPHILS 04/22/2018 0.1  0.0 - 0.4 K/UL Final   ??? ABS. BASOPHILS 04/22/2018 0.0  0.0 - 0.1 K/UL Final   ??? DF 04/22/2018 AUTOMATED    Final   ??? Sodium 04/22/2018 140  136 - 145 mmol/L Final   ??? Potassium 04/22/2018 4.1   3.5 - 5.5 mmol/L Final   ??? Chloride 04/22/2018 105  100 - 108 mmol/L Final   ??? CO2 04/22/2018 28  21 - 32 mmol/L Final   ??? Anion gap 04/22/2018 7  3.0 - 18 mmol/L Final   ??? Glucose 04/22/2018 84  74 - 99 mg/dL Final   ??? BUN 16/09/9603 10  7.0 - 18 MG/DL Final   ??? Creatinine 04/22/2018 0.63  0.6 - 1.3 MG/DL Final   ??? BUN/Creatinine ratio 04/22/2018 16  12 - 20   Final   ??? GFR est AA 04/22/2018 >60  >60 ml/min/1.36m2 Final   ??? GFR est non-AA 04/22/2018 >60  >60 ml/min/1.74m2 Final    Comment: (NOTE)  Estimated GFR is calculated using the Modification of Diet in Renal   Disease (MDRD) Study equation, reported for both African Americans   (GFRAA) and non-African Americans (GFRNA), and normalized to 1.59m2   body surface area. The physician must decide which value applies to   the patient. The MDRD study equation should only be used in   individuals age 96 or older. It has not been validated for the   following: pregnant women, patients with serious comorbid conditions,   or on certain medications, or persons with extremes of body size,   muscle mass, or nutritional status.     ??? Calcium 04/22/2018 9.7  8.5 - 10.1 MG/DL Final   ??? LIPID PROFILE 04/22/2018        Final   ??? Cholesterol, total 04/22/2018 191  <200 MG/DL Final   ??? Triglyceride 04/22/2018 206* <150 MG/DL Final    Comment: The drugs N-acetylcysteine (NAC) and  Metamiszole have been found to cause falsely  low results in this chemical assay. Please  be sure to submit blood samples obtained  BEFORE administration of either of these  drugs to assure correct results.     ??? HDL Cholesterol 04/22/2018 52  40 - 60 MG/DL Final   ??? LDL, calculated 04/22/2018 97.8  0 - 100 MG/DL Final   ??? VLDL, calculated 04/22/2018 41.2  MG/DL Final   ??? CHOL/HDL Ratio 04/22/2018 3.7  0 - 5.0   Final       .No results found for any visits on 07/02/18.    Assessment / Plan:      ICD-10-CM ICD-9-CM    1. Hypertension, unspecified type I10 401.9 metoprolol tartrate (LOPRESSOR) 25 mg tablet       hydroCHLOROthiazide (HYDRODIURIL) 12.5 mg tablet      metoprolol tartrate (LOPRESSOR) 50 mg tablet   2. Hypertriglyceridemia E78.1 272.1 fenofibrate nanocrystallized (TRICOR) 145 mg tablet   3. Atrial fibrillation, unspecified type (HCC) I48.91 427.31 apixaban (ELIQUIS) 5  mg tablet   4. Dizziness R42 780.4 meclizine (ANTIVERT) 25 mg tablet     Patient informed that Dr. Burnadette PeterLynch, cardiologist office is attempting to reschedule her appointment.   Refill metoprolol 1 fenofibrate, Eliquis and Antivert  Fasting labs next visit  Hypertension-blood pressure controlled  Follow-up in 4 months    Follow-up and Dispositions    ?? Return in about 4 months (around 11/02/2018).         I asked the patient if she  had any questions and answered her  questions.  The patient stated that she understands the treatment plan and agrees with the treatment plan    This document was created with a voice activated dictation system and may contain transcription errors.

## 2018-07-04 ENCOUNTER — Encounter

## 2018-07-04 MED ORDER — METOPROLOL TARTRATE 25 MG TAB
25 mg | ORAL_TABLET | Freq: Two times a day (BID) | ORAL | 2 refills | Status: DC
Start: 2018-07-04 — End: 2018-09-11

## 2018-07-04 MED ORDER — MECLIZINE 25 MG TAB
25 mg | ORAL_TABLET | Freq: Four times a day (QID) | ORAL | 1 refills | Status: DC | PRN
Start: 2018-07-04 — End: 2021-05-05

## 2018-07-04 MED ORDER — HYDROCHLOROTHIAZIDE 12.5 MG TAB
12.5 mg | ORAL_TABLET | Freq: Every day | ORAL | 1 refills | Status: DC
Start: 2018-07-04 — End: 2018-09-24

## 2018-07-04 MED ORDER — METOPROLOL TARTRATE 50 MG TAB
50 mg | ORAL_TABLET | Freq: Two times a day (BID) | ORAL | 1 refills | Status: DC
Start: 2018-07-04 — End: 2018-09-05

## 2018-07-04 MED ORDER — APIXABAN 5 MG TABLET
5 mg | ORAL_TABLET | Freq: Two times a day (BID) | ORAL | 1 refills | Status: DC
Start: 2018-07-04 — End: 2018-09-24

## 2018-07-04 MED ORDER — FENOFIBRATE NANOCRYSTALLIZED 145 MG TAB
145 mg | ORAL_TABLET | Freq: Every day | ORAL | 1 refills | Status: DC
Start: 2018-07-04 — End: 2018-09-24

## 2018-07-04 NOTE — Telephone Encounter (Signed)
Requested Prescriptions     Pending Prescriptions Disp Refills   ??? metoprolol tartrate (LOPRESSOR) 25 mg tablet 60 Tab 2     Sig: Take 1 Tab by mouth two (2) times a day.   ??? metoprolol tartrate (LOPRESSOR) 50 mg tablet 180 Tab 1     Sig: Take 1 Tab by mouth two (2) times a day.   ??? meclizine (ANTIVERT) 25 mg tablet 120 Tab 1     Sig: Take 1 Tab by mouth every six (6) hours as needed for Dizziness.   ??? hydroCHLOROthiazide (HYDRODIURIL) 12.5 mg tablet 90 Tab 1     Sig: Take 1 Tab by mouth daily.   ??? fenofibrate nanocrystallized (TRICOR) 145 mg tablet 90 Tab 1     Sig: Take 1 Tab by mouth daily.   ??? apixaban (ELIQUIS) 5 mg tablet 180 Tab 1     Sig: Take 1 Tab by mouth two (2) times a day.

## 2018-07-05 ENCOUNTER — Encounter: Payer: Self-pay | Admitting: Cardiology

## 2018-07-08 ENCOUNTER — Encounter: Payer: Self-pay | Admitting: Cardiology

## 2018-07-29 DIAGNOSIS — L821 Other seborrheic keratosis: Secondary | ICD-10-CM | POA: Diagnosis not present

## 2018-07-29 DIAGNOSIS — D229 Melanocytic nevi, unspecified: Secondary | ICD-10-CM | POA: Diagnosis not present

## 2018-07-29 DIAGNOSIS — D485 Neoplasm of uncertain behavior of skin: Secondary | ICD-10-CM | POA: Diagnosis not present

## 2018-08-01 DIAGNOSIS — I482 Chronic atrial fibrillation: Secondary | ICD-10-CM | POA: Diagnosis not present

## 2018-08-01 DIAGNOSIS — Z950 Presence of cardiac pacemaker: Secondary | ICD-10-CM | POA: Diagnosis not present

## 2018-08-01 DIAGNOSIS — I495 Sick sinus syndrome: Secondary | ICD-10-CM | POA: Diagnosis not present

## 2018-08-08 DIAGNOSIS — I482 Chronic atrial fibrillation: Secondary | ICD-10-CM | POA: Diagnosis not present

## 2018-08-08 DIAGNOSIS — Z7901 Long term (current) use of anticoagulants: Secondary | ICD-10-CM | POA: Diagnosis not present

## 2018-08-08 DIAGNOSIS — H547 Unspecified visual loss: Secondary | ICD-10-CM | POA: Diagnosis not present

## 2018-08-08 DIAGNOSIS — I495 Sick sinus syndrome: Secondary | ICD-10-CM | POA: Diagnosis not present

## 2018-09-05 ENCOUNTER — Encounter

## 2018-09-05 MED ORDER — METOPROLOL TARTRATE 50 MG TAB
50 mg | ORAL_TABLET | Freq: Two times a day (BID) | ORAL | 1 refills | Status: DC
Start: 2018-09-05 — End: 2018-09-11

## 2018-09-05 NOTE — Telephone Encounter (Signed)
Requested Prescriptions     Pending Prescriptions Disp Refills   ??? metoprolol tartrate (LOPRESSOR) 50 mg tablet 180 Tab 1     Sig: Take 1 Tab by mouth two (2) times a day.

## 2018-09-11 ENCOUNTER — Encounter

## 2018-09-11 MED ORDER — METOPROLOL TARTRATE 25 MG TAB
25 mg | ORAL_TABLET | Freq: Two times a day (BID) | ORAL | 2 refills | Status: DC
Start: 2018-09-11 — End: 2018-09-24

## 2018-09-11 MED ORDER — METOPROLOL TARTRATE 50 MG TAB
50 mg | ORAL_TABLET | Freq: Two times a day (BID) | ORAL | 1 refills | Status: DC
Start: 2018-09-11 — End: 2018-09-24

## 2018-09-11 NOTE — Telephone Encounter (Signed)
[facility] called to verify dosage for Lopressor.  They have prescription for 50 mg and 25 mg.

## 2018-09-11 NOTE — Telephone Encounter (Signed)
Last seen 07/02/18  Next appt  09/24/18    Requested Prescriptions     Pending Prescriptions Disp Refills   ??? metoprolol tartrate (LOPRESSOR) 50 mg tablet 180 Tab 1     Sig: Take 1 Tab by mouth two (2) times a day.   ??? metoprolol tartrate (LOPRESSOR) 25 mg tablet 60 Tab 2     Sig: Take 1 Tab by mouth two (2) times a day.

## 2018-09-16 NOTE — Telephone Encounter (Signed)
Spoke with [facility] informed them that her Lopressor was ordered for 50mg .

## 2018-09-17 DIAGNOSIS — L600 Ingrowing nail: Secondary | ICD-10-CM | POA: Diagnosis not present

## 2018-09-17 DIAGNOSIS — L602 Onychogryphosis: Secondary | ICD-10-CM | POA: Diagnosis not present

## 2018-09-17 DIAGNOSIS — M79675 Pain in left toe(s): Secondary | ICD-10-CM | POA: Diagnosis not present

## 2018-09-17 DIAGNOSIS — G603 Idiopathic progressive neuropathy: Secondary | ICD-10-CM | POA: Diagnosis not present

## 2018-09-17 DIAGNOSIS — M79674 Pain in right toe(s): Secondary | ICD-10-CM | POA: Diagnosis not present

## 2018-09-24 ENCOUNTER — Ambulatory Visit: Admit: 2018-09-24 | Discharge: 2018-09-24 | Payer: MEDICARE | Attending: Family | Primary: Internal Medicine

## 2018-09-24 ENCOUNTER — Ambulatory Visit: Attending: Family | Primary: Internal Medicine

## 2018-09-24 DIAGNOSIS — Z1321 Encounter for screening for nutritional disorder: Secondary | ICD-10-CM

## 2018-09-24 DIAGNOSIS — I10 Essential (primary) hypertension: Secondary | ICD-10-CM | POA: Diagnosis not present

## 2018-09-24 DIAGNOSIS — Z23 Encounter for immunization: Secondary | ICD-10-CM | POA: Diagnosis not present

## 2018-09-24 DIAGNOSIS — Z6829 Body mass index (BMI) 29.0-29.9, adult: Secondary | ICD-10-CM | POA: Diagnosis not present

## 2018-09-24 DIAGNOSIS — I4891 Unspecified atrial fibrillation: Secondary | ICD-10-CM | POA: Diagnosis not present

## 2018-09-24 DIAGNOSIS — Z780 Asymptomatic menopausal state: Secondary | ICD-10-CM | POA: Diagnosis not present

## 2018-09-24 DIAGNOSIS — E781 Pure hyperglyceridemia: Secondary | ICD-10-CM | POA: Diagnosis not present

## 2018-09-24 MED ORDER — HYDROCHLOROTHIAZIDE 12.5 MG TAB
12.5 mg | ORAL_TABLET | Freq: Every day | ORAL | 1 refills | Status: DC
Start: 2018-09-24 — End: 2019-04-08

## 2018-09-24 MED ORDER — FENOFIBRATE NANOCRYSTALLIZED 145 MG TAB
145 mg | ORAL_TABLET | Freq: Every day | ORAL | 1 refills | Status: DC
Start: 2018-09-24 — End: 2018-11-08

## 2018-09-24 MED ORDER — METOPROLOL TARTRATE 50 MG TAB
50 mg | ORAL_TABLET | Freq: Two times a day (BID) | ORAL | 1 refills | Status: DC
Start: 2018-09-24 — End: 2019-04-02

## 2018-09-24 MED ORDER — METOPROLOL TARTRATE 25 MG TAB
25 mg | ORAL_TABLET | Freq: Two times a day (BID) | ORAL | 2 refills | Status: DC
Start: 2018-09-24 — End: 2019-04-08

## 2018-09-24 MED ORDER — APIXABAN 5 MG TABLET
5 mg | ORAL_TABLET | Freq: Two times a day (BID) | ORAL | 1 refills | Status: DC
Start: 2018-09-24 — End: 2019-04-08

## 2018-09-24 NOTE — Progress Notes (Signed)
Emily Henson is a 84 y.o. female presenting today for Hypertension (follow-up)    Hypertension    Pertinent negatives include no chest pain, no palpitations, no headaches and no dizziness.   :  Emily Henson presents to the office today for hypertension and hyperlipidemia follow-up care.  Patient presents today stating she feels well.  She denies any chest pain, palpitation lower extremity edema.  She was previously referred to dermatology in May, 2019 and notes she was seen and the lesion on her forehead was removed.  Patient has a history of hypertension and notes that she is compliant with taking her medicines every day.  She is negative for any chest pain, palpitation or lower extremity edema.      Review of Systems   Respiratory: Negative for cough.    Cardiovascular: Negative for chest pain, palpitations and leg swelling.   Gastrointestinal: Negative for abdominal pain, constipation and diarrhea.   Musculoskeletal: Negative for back pain and myalgias.   Neurological: Negative for dizziness and headaches.       No Known Allergies    Current Outpatient Medications   Medication Sig Dispense Refill   ??? cinnamon bark (CINNAMON PO) Take  by mouth.     ??? apixaban (ELIQUIS) 5 mg tablet Take 1 Tab by mouth two (2) times a day. 180 Tab 1   ??? fenofibrate nanocrystallized (TRICOR) 145 mg tablet Take 1 Tab by mouth daily. 90 Tab 1   ??? hydroCHLOROthiazide (HYDRODIURIL) 12.5 mg tablet Take 1 Tab by mouth daily. 90 Tab 1   ??? metoprolol tartrate (LOPRESSOR) 25 mg tablet Take 1 Tab by mouth two (2) times a day. 180 Tab 2   ??? metoprolol tartrate (LOPRESSOR) 50 mg tablet Take 1 Tab by mouth two (2) times a day. 180 Tab 1   ??? meclizine (ANTIVERT) 25 mg tablet Take 1 Tab by mouth every six (6) hours as needed for Dizziness. 120 Tab 1   ??? verapamil ER (VERELAN) 240 mg ER capsule Take 240 mg by mouth daily.     ??? cholecalciferol, vitamin D3, (VITAMIN D3) 2,000 unit tab Take 1 Tab by mouth daily. 30 Tab 2   ??? cyanocobalamin  (VITAMIN B-12) 1,000 mcg tablet Take 1,000 mcg by mouth daily.         Past Medical History:   Diagnosis Date   ??? Arrhythmia     Atrial Fibrillation   ??? Arthritis    ??? CAD (coronary artery disease)    ??? Hypercholesterolemia     hypertriglycerides   ??? Hypertension        Past Surgical History:   Procedure Laterality Date   ??? HX APPENDECTOMY     ??? HX BREAST REDUCTION  1990   ??? HX CHOLECYSTECTOMY     ??? HX COLECTOMY     ??? HX COLECTOMY     ??? HX COLONOSCOPY     ??? HX GYN      hysterectomy   ??? HX PACEMAKER         Social History     Socioeconomic History   ??? Marital status: MARRIED     Spouse name: Not on file   ??? Number of children: Not on file   ??? Years of education: Not on file   ??? Highest education level: Not on file   Occupational History   ??? Not on file   Social Needs   ??? Financial resource strain: Not on file   ??? Food insecurity:  Worry: Not on file     Inability: Not on file   ??? Transportation needs:     Medical: Not on file     Non-medical: Not on file   Tobacco Use   ??? Smoking status: Former Smoker   ??? Smokeless tobacco: Never Used   Substance and Sexual Activity   ??? Alcohol use: No   ??? Drug use: No   ??? Sexual activity: Never     Partners: Male   Lifestyle   ??? Physical activity:     Days per week: Not on file     Minutes per session: Not on file   ??? Stress: Not on file   Relationships   ??? Social connections:     Talks on phone: Not on file     Gets together: Not on file     Attends religious service: Not on file     Active member of club or organization: Not on file     Attends meetings of clubs or organizations: Not on file     Relationship status: Not on file   ??? Intimate partner violence:     Fear of current or ex partner: Not on file     Emotionally abused: Not on file     Physically abused: Not on file     Forced sexual activity: Not on file   Other Topics Concern   ??? Not on file   Social History Narrative   ??? Not on file       Patient does not have an advanced directive on file    Vitals:    09/24/18 0916    BP: 136/85   Pulse: 70   Resp: 12   Temp: 95 ??F (35 ??C)   TempSrc: Oral   SpO2: 98%   Weight: 179 lb (81.2 kg)   Height: 5\' 5"  (1.651 m)   PainSc:   0 - No pain       Physical Exam   Constitutional: She is oriented to person, place, and time. No distress.   Cardiovascular: Normal rate, regular rhythm and normal heart sounds.   Pulmonary/Chest: Effort normal and breath sounds normal.   Lymphadenopathy:     She has no cervical adenopathy.   Neurological: She is alert and oriented to person, place, and time.   Skin: Skin is warm.   Nursing note and vitals reviewed.      No visits with results within 3 Month(s) from this visit.   Latest known visit with results is:   Hospital Outpatient Visit on 04/22/2018   Component Date Value Ref Range Status   ??? WBC 04/22/2018 6.1  4.6 - 13.2 K/uL Final   ??? RBC 04/22/2018 4.85  4.20 - 5.30 M/uL Final   ??? HGB 04/22/2018 15.3  12.0 - 16.0 g/dL Final   ??? HCT 16/10/960405/05/2018 47.7* 35.0 - 45.0 % Final   ??? MCV 04/22/2018 98.4* 74.0 - 97.0 FL Final   ??? MCH 04/22/2018 31.5  24.0 - 34.0 PG Final   ??? MCHC 04/22/2018 32.1  31.0 - 37.0 g/dL Final   ??? RDW 54/09/811905/05/2018 13.7  11.6 - 14.5 % Final   ??? PLATELET 04/22/2018 227  135 - 420 K/uL Final   ??? MPV 04/22/2018 10.9  9.2 - 11.8 FL Final   ??? NEUTROPHILS 04/22/2018 61  40 - 73 % Final   ??? LYMPHOCYTES 04/22/2018 29  21 - 52 % Final   ??? MONOCYTES 04/22/2018 9  3 -  10 % Final   ??? EOSINOPHILS 04/22/2018 1  0 - 5 % Final   ??? BASOPHILS 04/22/2018 0  0 - 2 % Final   ??? ABS. NEUTROPHILS 04/22/2018 3.7  1.8 - 8.0 K/UL Final   ??? ABS. LYMPHOCYTES 04/22/2018 1.8  0.9 - 3.6 K/UL Final   ??? ABS. MONOCYTES 04/22/2018 0.6  0.05 - 1.2 K/UL Final   ??? ABS. EOSINOPHILS 04/22/2018 0.1  0.0 - 0.4 K/UL Final   ??? ABS. BASOPHILS 04/22/2018 0.0  0.0 - 0.1 K/UL Final   ??? DF 04/22/2018 AUTOMATED    Final   ??? Sodium 04/22/2018 140  136 - 145 mmol/L Final   ??? Potassium 04/22/2018 4.1  3.5 - 5.5 mmol/L Final   ??? Chloride 04/22/2018 105  100 - 108 mmol/L Final   ??? CO2 04/22/2018 28  21  - 32 mmol/L Final   ??? Anion gap 04/22/2018 7  3.0 - 18 mmol/L Final   ??? Glucose 04/22/2018 84  74 - 99 mg/dL Final   ??? BUN 16/09/9603 10  7.0 - 18 MG/DL Final   ??? Creatinine 04/22/2018 0.63  0.6 - 1.3 MG/DL Final   ??? BUN/Creatinine ratio 04/22/2018 16  12 - 20   Final   ??? GFR est AA 04/22/2018 >60  >60 ml/min/1.8m2 Final   ??? GFR est non-AA 04/22/2018 >60  >60 ml/min/1.69m2 Final    Comment: (NOTE)  Estimated GFR is calculated using the Modification of Diet in Renal   Disease (MDRD) Study equation, reported for both African Americans   (GFRAA) and non-African Americans (GFRNA), and normalized to 1.53m2   body surface area. The physician must decide which value applies to   the patient. The MDRD study equation should only be used in   individuals age 35 or older. It has not been validated for the   following: pregnant women, patients with serious comorbid conditions,   or on certain medications, or persons with extremes of body size,   muscle mass, or nutritional status.     ??? Calcium 04/22/2018 9.7  8.5 - 10.1 MG/DL Final   ??? LIPID PROFILE 04/22/2018        Final   ??? Cholesterol, total 04/22/2018 191  <200 MG/DL Final   ??? Triglyceride 04/22/2018 206* <150 MG/DL Final    Comment: The drugs N-acetylcysteine (NAC) and  Metamiszole have been found to cause falsely  low results in this chemical assay. Please  be sure to submit blood samples obtained  BEFORE administration of either of these  drugs to assure correct results.     ??? HDL Cholesterol 04/22/2018 52  40 - 60 MG/DL Final   ??? LDL, calculated 04/22/2018 97.8  0 - 100 MG/DL Final   ??? VLDL, calculated 04/22/2018 41.2  MG/DL Final   ??? CHOL/HDL Ratio 04/22/2018 3.7  0 - 5.0   Final       .No results found for any visits on 09/24/18.    Assessment / Plan:      ICD-10-CM ICD-9-CM    1. Encounter for vitamin deficiency screening Z13.21 V77.99    2. Atrial fibrillation, unspecified type (HCC) I48.91 427.31 apixaban (ELIQUIS) 5 mg tablet   3. Hypertriglyceridemia E78.1  272.1 fenofibrate nanocrystallized (TRICOR) 145 mg tablet   4. Hypertension, unspecified type I10 401.9 hydroCHLOROthiazide (HYDRODIURIL) 12.5 mg tablet      metoprolol tartrate (LOPRESSOR) 25 mg tablet      metoprolol tartrate (LOPRESSOR) 50 mg tablet  LIPID PANEL      METABOLIC PANEL, COMPREHENSIVE      CBC WITH AUTOMATED DIFF   5. Encounter for immunization Z23 V03.89 INFLUENZA VACCINE INACTIVATED (IIV), SUBUNIT, ADJUVANTED, IM      ADMIN INFLUENZA VIRUS VAC   6. Menopause Z78.0 627.2      Patient informed that Dr. Burnadette Peter, cardiologist office is attempting to reschedule her appointment.   Refill metoprolol 1 fenofibrate, Eliquis and Antivert  Fasting labs next visit  Hypertension-blood pressure controlled  Follow-up in 4 months    Follow-up and Dispositions    ?? Return in about 4 months (around 01/25/2019), or if symptoms worsen or fail to improve.         I asked the patient if she  had any questions and answered her  questions.  The patient stated that she understands the treatment plan and agrees with the treatment plan    This document was created with a voice activated dictation system and may contain transcription errors.

## 2018-09-24 NOTE — Progress Notes (Signed)
Visit Vitals  BP 136/85   Pulse 70   Temp 95 F (35 C) (Oral)   Resp 12   Ht 5\' 5"  (1.651 m)   Wt 179 lb (81.2 kg)   SpO2 98%   BMI 29.79 kg/m     Emily Henson presents today for   Chief Complaint   Patient presents with   . Hypertension     follow-up       Is someone accompanying this pt? no    Is the patient using any DME equipment during OV? no    Depression Screening:  3 most recent PHQ Screens 09/24/2018   Little interest or pleasure in doing things Not at all   Feeling down, depressed, irritable, or hopeless Not at all   Total Score PHQ 2 0       Learning Assessment:  Learning Assessment 08/21/2017   PRIMARY LEARNER Patient   HIGHEST LEVEL OF EDUCATION - PRIMARY LEARNER  GRADUATED HIGH SCHOOL OR GED   BARRIERS PRIMARY LEARNER VISUAL   CO-LEARNER CAREGIVER No   PRIMARY LANGUAGE ENGLISH   LEARNER PREFERENCE PRIMARY OTHER (COMMENT)   ANSWERED BY patient   RELATIONSHIP SELF       Abuse Screening:  Abuse Screening Questionnaire 08/21/2017   Do you ever feel afraid of your partner? N   Are you in a relationship with someone who physically or mentally threatens you? N   Is it safe for you to go home? Y       Fall Risk  Fall Risk Assessment, last 12 mths 09/24/2018   Able to walk? Yes   Fall in past 12 months? No       Health Maintenance reviewed and discussed and ordered per Provider.    Health Maintenance Due   Topic Date Due   . Shingrix Vaccine Age 61> (1 of 2) 10/01/1985   . GLAUCOMA SCREENING Q2Y  10/01/2000   . Bone Densitometry (Dexa) Screening  10/01/2000   . BREAST CANCER SCRN MAMMOGRAM  10/05/2017   . Influenza Age 95 to Adult  07/18/2018           Coordination of Care:  1. Have you been to the ER, urgent care clinic since your last visit? Hospitalized since your last visit? no    2. Have you seen or consulted any other health care providers outside of the [facility] since your last visit? Include any pap smears or colon screening. no

## 2018-09-24 NOTE — Progress Notes (Signed)
Visit Vitals  BP 136/85   Pulse 70   Temp 95 ??F (35 ??C) (Oral)   Resp 12   Ht 5' 5" (1.651 m)   Wt 179 lb (81.2 kg)   SpO2 98%   BMI 29.79 kg/m??     Emily Henson presents today for   Chief Complaint   Patient presents with   ??? Hypertension     follow-up       Is someone accompanying this pt? no    Is the patient using any DME equipment during OV? no    Depression Screening:  3 most recent PHQ Screens 09/24/2018   Little interest or pleasure in doing things Not at all   Feeling down, depressed, irritable, or hopeless Not at all   Total Score PHQ 2 0       Learning Assessment:  Learning Assessment 08/21/2017   PRIMARY LEARNER Patient   HIGHEST LEVEL OF EDUCATION - PRIMARY LEARNER  GRADUATED HIGH SCHOOL OR GED   BARRIERS PRIMARY LEARNER VISUAL   CO-LEARNER CAREGIVER No   PRIMARY LANGUAGE ENGLISH   LEARNER PREFERENCE PRIMARY OTHER (COMMENT)   ANSWERED BY patient   RELATIONSHIP SELF       Abuse Screening:  Abuse Screening Questionnaire 08/21/2017   Do you ever feel afraid of your partner? N   Are you in a relationship with someone who physically or mentally threatens you? N   Is it safe for you to go home? Y       Fall Risk  Fall Risk Assessment, last 12 mths 09/24/2018   Able to walk? Yes   Fall in past 12 months? No       Health Maintenance reviewed and discussed and ordered per Provider.    Health Maintenance Due   Topic Date Due   ??? Shingrix Vaccine Age 50> (1 of 2) 10/01/1985   ??? GLAUCOMA SCREENING Q2Y  10/01/2000   ??? Bone Densitometry (Dexa) Screening  10/01/2000   ??? BREAST CANCER SCRN MAMMOGRAM  10/05/2017   ??? Influenza Age 9 to Adult  07/18/2018           Coordination of Care:  1. Have you been to the ER, urgent care clinic since your last visit? Hospitalized since your last visit? no    2. Have you seen or consulted any other health care providers outside of the [facility] since your last visit? Include any pap smears or colon screening. no

## 2018-09-24 NOTE — Progress Notes (Signed)
Emily Henson is a 84 y.o. female presenting today for Hypertension (follow-up)    Hypertension    Pertinent negatives include no chest pain, no palpitations, no headaches and no dizziness.   :  Emily Henson presents to the office today for hypertension and hyperlipidemia follow-up care.  Patient presents today stating she feels well.  She denies any chest pain, palpitation lower extremity edema.  She was previously referred to dermatology in May, 2019 and notes she was seen and the lesion on her forehead was removed.  Patient has a history of hypertension and notes that she is compliant with taking her medicines every day.  She is negative for any chest pain, palpitation or lower extremity edema.      Review of Systems   Respiratory: Negative for cough.    Cardiovascular: Negative for chest pain, palpitations and leg swelling.   Gastrointestinal: Negative for abdominal pain, constipation and diarrhea.   Musculoskeletal: Negative for back pain and myalgias.   Neurological: Negative for dizziness and headaches.       No Known Allergies    Current Outpatient Medications   Medication Sig Dispense Refill   ??? cinnamon bark (CINNAMON PO) Take  by mouth.     ??? apixaban (ELIQUIS) 5 mg tablet Take 1 Tab by mouth two (2) times a day. 180 Tab 1   ??? fenofibrate nanocrystallized (TRICOR) 145 mg tablet Take 1 Tab by mouth daily. 90 Tab 1   ??? hydroCHLOROthiazide (HYDRODIURIL) 12.5 mg tablet Take 1 Tab by mouth daily. 90 Tab 1   ??? metoprolol tartrate (LOPRESSOR) 25 mg tablet Take 1 Tab by mouth two (2) times a day. 180 Tab 2   ??? metoprolol tartrate (LOPRESSOR) 50 mg tablet Take 1 Tab by mouth two (2) times a day. 180 Tab 1   ??? meclizine (ANTIVERT) 25 mg tablet Take 1 Tab by mouth every six (6) hours as needed for Dizziness. 120 Tab 1   ??? verapamil ER (VERELAN) 240 mg ER capsule Take 240 mg by mouth daily.     ??? cholecalciferol, vitamin D3, (VITAMIN D3) 2,000 unit tab Take 1 Tab by mouth daily. 30 Tab 2    ??? cyanocobalamin (VITAMIN B-12) 1,000 mcg tablet Take 1,000 mcg by mouth daily.         Past Medical History:   Diagnosis Date   ??? Arrhythmia     Atrial Fibrillation   ??? Arthritis    ??? CAD (coronary artery disease)    ??? Hypercholesterolemia     hypertriglycerides   ??? Hypertension        Past Surgical History:   Procedure Laterality Date   ??? HX APPENDECTOMY     ??? HX BREAST REDUCTION  1990   ??? HX CHOLECYSTECTOMY     ??? HX COLECTOMY     ??? HX COLECTOMY     ??? HX COLONOSCOPY     ??? HX GYN      hysterectomy   ??? HX PACEMAKER         Social History     Socioeconomic History   ??? Marital status: MARRIED     Spouse name: Not on file   ??? Number of children: Not on file   ??? Years of education: Not on file   ??? Highest education level: Not on file   Occupational History   ??? Not on file   Social Needs   ??? Financial resource strain: Not on file   ??? Food insecurity:  Worry: Not on file     Inability: Not on file   ??? Transportation needs:     Medical: Not on file     Non-medical: Not on file   Tobacco Use   ??? Smoking status: Former Smoker   ??? Smokeless tobacco: Never Used   Substance and Sexual Activity   ??? Alcohol use: No   ??? Drug use: No   ??? Sexual activity: Never     Partners: Male   Lifestyle   ??? Physical activity:     Days per week: Not on file     Minutes per session: Not on file   ??? Stress: Not on file   Relationships   ??? Social connections:     Talks on phone: Not on file     Gets together: Not on file     Attends religious service: Not on file     Active member of club or organization: Not on file     Attends meetings of clubs or organizations: Not on file     Relationship status: Not on file   ??? Intimate partner violence:     Fear of current or ex partner: Not on file     Emotionally abused: Not on file     Physically abused: Not on file     Forced sexual activity: Not on file   Other Topics Concern   ??? Not on file   Social History Narrative   ??? Not on file       Patient does not have an advanced directive on file     Vitals:    09/24/18 0916   BP: 136/85   Pulse: 70   Resp: 12   Temp: 95 ??F (35 ??C)   TempSrc: Oral   SpO2: 98%   Weight: 179 lb (81.2 kg)   Height: 5\' 5"  (1.651 m)   PainSc:   0 - No pain       Physical Exam   Constitutional: She is oriented to person, place, and time. No distress.   Cardiovascular: Normal rate, regular rhythm and normal heart sounds.   Pulmonary/Chest: Effort normal and breath sounds normal.   Lymphadenopathy:     She has no cervical adenopathy.   Neurological: She is alert and oriented to person, place, and time.   Skin: Skin is warm.   Nursing note and vitals reviewed.      No visits with results within 3 Month(s) from this visit.   Latest known visit with results is:   Hospital Outpatient Visit on 04/22/2018   Component Date Value Ref Range Status   ??? WBC 04/22/2018 6.1  4.6 - 13.2 K/uL Final   ??? RBC 04/22/2018 4.85  4.20 - 5.30 M/uL Final   ??? HGB 04/22/2018 15.3  12.0 - 16.0 g/dL Final   ??? HCT 16/09/9603 47.7* 35.0 - 45.0 % Final   ??? MCV 04/22/2018 98.4* 74.0 - 97.0 FL Final   ??? MCH 04/22/2018 31.5  24.0 - 34.0 PG Final   ??? MCHC 04/22/2018 32.1  31.0 - 37.0 g/dL Final   ??? RDW 54/08/8118 13.7  11.6 - 14.5 % Final   ??? PLATELET 04/22/2018 227  135 - 420 K/uL Final   ??? MPV 04/22/2018 10.9  9.2 - 11.8 FL Final   ??? NEUTROPHILS 04/22/2018 61  40 - 73 % Final   ??? LYMPHOCYTES 04/22/2018 29  21 - 52 % Final   ??? MONOCYTES 04/22/2018 9  3 -  10 % Final   ??? EOSINOPHILS 04/22/2018 1  0 - 5 % Final   ??? BASOPHILS 04/22/2018 0  0 - 2 % Final   ??? ABS. NEUTROPHILS 04/22/2018 3.7  1.8 - 8.0 K/UL Final   ??? ABS. LYMPHOCYTES 04/22/2018 1.8  0.9 - 3.6 K/UL Final   ??? ABS. MONOCYTES 04/22/2018 0.6  0.05 - 1.2 K/UL Final   ??? ABS. EOSINOPHILS 04/22/2018 0.1  0.0 - 0.4 K/UL Final   ??? ABS. BASOPHILS 04/22/2018 0.0  0.0 - 0.1 K/UL Final   ??? DF 04/22/2018 AUTOMATED    Final   ??? Sodium 04/22/2018 140  136 - 145 mmol/L Final   ??? Potassium 04/22/2018 4.1  3.5 - 5.5 mmol/L Final    ??? Chloride 04/22/2018 105  100 - 108 mmol/L Final   ??? CO2 04/22/2018 28  21 - 32 mmol/L Final   ??? Anion gap 04/22/2018 7  3.0 - 18 mmol/L Final   ??? Glucose 04/22/2018 84  74 - 99 mg/dL Final   ??? BUN 43/32/9518 10  7.0 - 18 MG/DL Final   ??? Creatinine 04/22/2018 0.63  0.6 - 1.3 MG/DL Final   ??? BUN/Creatinine ratio 04/22/2018 16  12 - 20   Final   ??? GFR est AA 04/22/2018 >60  >60 ml/min/1.3m2 Final   ??? GFR est non-AA 04/22/2018 >60  >60 ml/min/1.56m2 Final    Comment: (NOTE)  Estimated GFR is calculated using the Modification of Diet in Renal   Disease (MDRD) Study equation, reported for both African Americans   (GFRAA) and non-African Americans (GFRNA), and normalized to 1.34m2   body surface area. The physician must decide which value applies to   the patient. The MDRD study equation should only be used in   individuals age 33 or older. It has not been validated for the   following: pregnant women, patients with serious comorbid conditions,   or on certain medications, or persons with extremes of body size,   muscle mass, or nutritional status.     ??? Calcium 04/22/2018 9.7  8.5 - 10.1 MG/DL Final   ??? LIPID PROFILE 04/22/2018        Final   ??? Cholesterol, total 04/22/2018 191  <200 MG/DL Final   ??? Triglyceride 04/22/2018 206* <150 MG/DL Final    Comment: The drugs N-acetylcysteine (NAC) and  Metamiszole have been found to cause falsely  low results in this chemical assay. Please  be sure to submit blood samples obtained  BEFORE administration of either of these  drugs to assure correct results.     ??? HDL Cholesterol 04/22/2018 52  40 - 60 MG/DL Final   ??? LDL, calculated 04/22/2018 97.8  0 - 100 MG/DL Final   ??? VLDL, calculated 04/22/2018 41.2  MG/DL Final   ??? CHOL/HDL Ratio 04/22/2018 3.7  0 - 5.0   Final       .No results found for any visits on 09/24/18.    Assessment / Plan:      ICD-10-CM ICD-9-CM    1. Encounter for vitamin deficiency screening Z13.21 V77.99     2. Atrial fibrillation, unspecified type (HCC) I48.91 427.31 apixaban (ELIQUIS) 5 mg tablet   3. Hypertriglyceridemia E78.1 272.1 fenofibrate nanocrystallized (TRICOR) 145 mg tablet   4. Hypertension, unspecified type I10 401.9 hydroCHLOROthiazide (HYDRODIURIL) 12.5 mg tablet      metoprolol tartrate (LOPRESSOR) 25 mg tablet      metoprolol tartrate (LOPRESSOR) 50 mg tablet  LIPID PANEL      METABOLIC PANEL, COMPREHENSIVE      CBC WITH AUTOMATED DIFF   5. Encounter for immunization Z23 V03.89 INFLUENZA VACCINE INACTIVATED (IIV), SUBUNIT, ADJUVANTED, IM      ADMIN INFLUENZA VIRUS VAC   6. Menopause Z78.0 627.2      Patient informed that Dr. Burnadette Peter, cardiologist office is attempting to reschedule her appointment.   Refill metoprolol 1 fenofibrate, Eliquis and Antivert  Fasting labs next visit  Hypertension-blood pressure controlled  Follow-up in 4 months    Follow-up and Dispositions    ?? Return in about 4 months (around 01/25/2019), or if symptoms worsen or fail to improve.         I asked the patient if she  had any questions and answered her  questions.  The patient stated that she understands the treatment plan and agrees with the treatment plan    This document was created with a voice activated dictation system and may contain transcription errors.

## 2018-10-22 DIAGNOSIS — L600 Ingrowing nail: Secondary | ICD-10-CM | POA: Diagnosis not present

## 2018-10-22 DIAGNOSIS — M79675 Pain in left toe(s): Secondary | ICD-10-CM | POA: Diagnosis not present

## 2018-10-22 DIAGNOSIS — M79674 Pain in right toe(s): Secondary | ICD-10-CM | POA: Diagnosis not present

## 2018-10-22 DIAGNOSIS — B351 Tinea unguium: Secondary | ICD-10-CM | POA: Diagnosis not present

## 2018-11-08 ENCOUNTER — Encounter

## 2018-11-08 MED ORDER — FENOFIBRATE NANOCRYSTALLIZED 145 MG TAB
145 mg | ORAL_TABLET | Freq: Every day | ORAL | 1 refills | Status: DC
Start: 2018-11-08 — End: 2019-04-08

## 2018-11-08 NOTE — Telephone Encounter (Signed)
Last seen 09/24/18  Next appt   12/03/18    Requested Prescriptions     Pending Prescriptions Disp Refills   ??? fenofibrate nanocrystallized (TRICOR) 145 mg tablet 90 Tab 1     Sig: Take 1 Tab by mouth daily.

## 2018-11-12 DIAGNOSIS — M2022 Hallux rigidus, left foot: Secondary | ICD-10-CM | POA: Diagnosis not present

## 2018-11-12 DIAGNOSIS — M79671 Pain in right foot: Secondary | ICD-10-CM | POA: Diagnosis not present

## 2018-11-12 DIAGNOSIS — M2021 Hallux rigidus, right foot: Secondary | ICD-10-CM | POA: Diagnosis not present

## 2018-11-12 DIAGNOSIS — M79672 Pain in left foot: Secondary | ICD-10-CM | POA: Diagnosis not present

## 2018-12-03 ENCOUNTER — Ambulatory Visit: Admit: 2018-12-03 | Discharge: 2018-12-03 | Payer: MEDICARE | Attending: Family | Primary: Internal Medicine

## 2018-12-03 ENCOUNTER — Ambulatory Visit: Attending: Family | Primary: Internal Medicine

## 2018-12-03 DIAGNOSIS — I10 Essential (primary) hypertension: Secondary | ICD-10-CM

## 2018-12-03 DIAGNOSIS — H543 Unqualified visual loss, both eyes: Secondary | ICD-10-CM | POA: Diagnosis not present

## 2018-12-03 DIAGNOSIS — I4891 Unspecified atrial fibrillation: Secondary | ICD-10-CM | POA: Diagnosis not present

## 2018-12-03 DIAGNOSIS — Z6829 Body mass index (BMI) 29.0-29.9, adult: Secondary | ICD-10-CM | POA: Diagnosis not present

## 2018-12-03 NOTE — Progress Notes (Signed)
Emily Henson is a 83 y.o. female presenting today for Hypertension (follow-up)    Hypertension    Pertinent negatives include no chest pain, no palpitations, no headaches and no dizziness.   :  Emily Henson presents to the office today for hypertension and hyperlipidemia follow-up care.  Patient presents today stating she feels well.  She denies any chest pain, palpitation lower extremity edema.  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  She and her husband plan to move to [place] with the daughter who is surgery for home.  Patient has a history of hypertension and notes that she is compliant with taking her medicines every day.  Her blood pressure is controlled today at 115/67.  She has a history of atrial fibrillation and is followed by Dr. Burnadette Peter.  She is negative for any chest pain, palpitation or lower extremity edema.      Review of Systems   Respiratory: Negative for cough.    Cardiovascular: Negative for chest pain, palpitations and leg swelling.   Gastrointestinal: Negative for abdominal pain, constipation and diarrhea.   Musculoskeletal: Negative for back pain and myalgias.   Neurological: Negative for dizziness and headaches.       No Known Allergies    Current Outpatient Medications   Medication Sig Dispense Refill   ??? fenofibrate nanocrystallized (TRICOR) 145 mg tablet Take 1 Tab by mouth daily. 90 Tab 1   ??? cyanocobalamin (VITAMIN B-12) 1,000 mcg tablet Take 1,000 mcg by mouth daily.     ??? cinnamon bark (CINNAMON PO) Take  by mouth.     ??? apixaban (ELIQUIS) 5 mg tablet Take 1 Tab by mouth two (2) times a day. 180 Tab 1   ??? hydroCHLOROthiazide (HYDRODIURIL) 12.5 mg tablet Take 1 Tab by mouth daily. 90 Tab 1   ??? metoprolol tartrate (LOPRESSOR) 25 mg tablet Take 1 Tab by mouth two (2) times a day. 180 Tab 2   ??? metoprolol tartrate (LOPRESSOR) 50 mg tablet Take 1 Tab by mouth two (2) times a day. 180 Tab 1   ??? meclizine (ANTIVERT) 25 mg tablet Take 1 Tab by mouth every six (6) hours as needed  for Dizziness. 120 Tab 1   ??? verapamil ER (VERELAN) 240 mg ER capsule Take 240 mg by mouth daily.     ??? cholecalciferol, vitamin D3, (VITAMIN D3) 2,000 unit tab Take 1 Tab by mouth daily. 30 Tab 2       Past Medical History:   Diagnosis Date   ??? Arrhythmia     Atrial Fibrillation   ??? Arthritis    ??? CAD (coronary artery disease)    ??? Hypercholesterolemia     hypertriglycerides   ??? Hypertension        Past Surgical History:   Procedure Laterality Date   ??? HX APPENDECTOMY     ??? HX BREAST REDUCTION  1990   ??? HX CHOLECYSTECTOMY     ??? HX COLECTOMY     ??? HX COLECTOMY     ??? HX COLONOSCOPY     ??? HX GYN      hysterectomy   ??? HX PACEMAKER         Social History     Socioeconomic History   ??? Marital status: MARRIED     Spouse name: Not on file   ??? Number of children: Not on file   ??? Years of education: Not on file   ??? Highest education level: Not on file  Occupational History   ??? Not on file   Social Needs   ??? Financial resource strain: Not on file   ??? Food insecurity:     Worry: Not on file     Inability: Not on file   ??? Transportation needs:     Medical: Not on file     Non-medical: Not on file   Tobacco Use   ??? Smoking status: Former Smoker   ??? Smokeless tobacco: Never Used   Substance and Sexual Activity   ??? Alcohol use: No   ??? Drug use: No   ??? Sexual activity: Never     Partners: Male   Lifestyle   ??? Physical activity:     Days per week: Not on file     Minutes per session: Not on file   ??? Stress: Not on file   Relationships   ??? Social connections:     Talks on phone: Not on file     Gets together: Not on file     Attends religious service: Not on file     Active member of club or organization: Not on file     Attends meetings of clubs or organizations: Not on file     Relationship status: Not on file   ??? Intimate partner violence:     Fear of current or ex partner: Not on file     Emotionally abused: Not on file     Physically abused: Not on file     Forced sexual activity: Not on file   Other Topics Concern   ??? Not on  file   Social History Narrative   ??? Not on file       Patient does not have an advanced directive on file    Vitals:    12/03/18 1017   BP: 115/67   Pulse: 87   Resp: 12   Temp: 95.9 ??F (35.5 ??C)   TempSrc: Oral   SpO2: 99%   Weight: 176 lb (79.8 kg)   Height: 5\' 5"  (1.651 m)   PainSc:   0 - No pain       Physical Exam   Constitutional: She is oriented to person, place, and time. No distress.   Cardiovascular: Normal rate, regular rhythm and normal heart sounds.   Pulmonary/Chest: Effort normal and breath sounds normal.   Lymphadenopathy:     She has no cervical adenopathy.   Neurological: She is alert and oriented to person, place, and time.   Skin: Skin is warm.   Nursing note and vitals reviewed.      No visits with results within 3 Month(s) from this visit.   Latest known visit with results is:   Hospital Outpatient Visit on 04/22/2018   Component Date Value Ref Range Status   ??? WBC 04/22/2018 6.1  4.6 - 13.2 K/uL Final   ??? RBC 04/22/2018 4.85  4.20 - 5.30 M/uL Final   ??? HGB 04/22/2018 15.3  12.0 - 16.0 g/dL Final   ??? HCT 16/09/9603 47.7* 35.0 - 45.0 % Final   ??? MCV 04/22/2018 98.4* 74.0 - 97.0 FL Final   ??? MCH 04/22/2018 31.5  24.0 - 34.0 PG Final   ??? MCHC 04/22/2018 32.1  31.0 - 37.0 g/dL Final   ??? RDW 54/08/8118 13.7  11.6 - 14.5 % Final   ??? PLATELET 04/22/2018 227  135 - 420 K/uL Final   ??? MPV 04/22/2018 10.9  9.2 - 11.8 FL Final   ???  NEUTROPHILS 04/22/2018 61  40 - 73 % Final   ??? LYMPHOCYTES 04/22/2018 29  21 - 52 % Final   ??? MONOCYTES 04/22/2018 9  3 - 10 % Final   ??? EOSINOPHILS 04/22/2018 1  0 - 5 % Final   ??? BASOPHILS 04/22/2018 0  0 - 2 % Final   ??? ABS. NEUTROPHILS 04/22/2018 3.7  1.8 - 8.0 K/UL Final   ??? ABS. LYMPHOCYTES 04/22/2018 1.8  0.9 - 3.6 K/UL Final   ??? ABS. MONOCYTES 04/22/2018 0.6  0.05 - 1.2 K/UL Final   ??? ABS. EOSINOPHILS 04/22/2018 0.1  0.0 - 0.4 K/UL Final   ??? ABS. BASOPHILS 04/22/2018 0.0  0.0 - 0.1 K/UL Final   ??? DF 04/22/2018 AUTOMATED    Final   ??? Sodium 04/22/2018 140  136 - 145  mmol/L Final   ??? Potassium 04/22/2018 4.1  3.5 - 5.5 mmol/L Final   ??? Chloride 04/22/2018 105  100 - 108 mmol/L Final   ??? CO2 04/22/2018 28  21 - 32 mmol/L Final   ??? Anion gap 04/22/2018 7  3.0 - 18 mmol/L Final   ??? Glucose 04/22/2018 84  74 - 99 mg/dL Final   ??? BUN 16/09/9603 10  7.0 - 18 MG/DL Final   ??? Creatinine 04/22/2018 0.63  0.6 - 1.3 MG/DL Final   ??? BUN/Creatinine ratio 04/22/2018 16  12 - 20   Final   ??? GFR est AA 04/22/2018 >60  >60 ml/min/1.83m2 Final   ??? GFR est non-AA 04/22/2018 >60  >60 ml/min/1.93m2 Final    Comment: (NOTE)  Estimated GFR is calculated using the Modification of Diet in Renal   Disease (MDRD) Study equation, reported for both African Americans   (GFRAA) and non-African Americans (GFRNA), and normalized to 1.67m2   body surface area. The physician must decide which value applies to   the patient. The MDRD study equation should only be used in   individuals age 72 or older. It has not been validated for the   following: pregnant women, patients with serious comorbid conditions,   or on certain medications, or persons with extremes of body size,   muscle mass, or nutritional status.     ??? Calcium 04/22/2018 9.7  8.5 - 10.1 MG/DL Final   ??? LIPID PROFILE 04/22/2018        Final   ??? Cholesterol, total 04/22/2018 191  <200 MG/DL Final   ??? Triglyceride 04/22/2018 206* <150 MG/DL Final    Comment: The drugs N-acetylcysteine (NAC) and  Metamiszole have been found to cause falsely  low results in this chemical assay. Please  be sure to submit blood samples obtained  BEFORE administration of either of these  drugs to assure correct results.     ??? HDL Cholesterol 04/22/2018 52  40 - 60 MG/DL Final   ??? LDL, calculated 04/22/2018 97.8  0 - 100 MG/DL Final   ??? VLDL, calculated 04/22/2018 41.2  MG/DL Final   ??? CHOL/HDL Ratio 04/22/2018 3.7  0 - 5.0   Final       .No results found for any visits on 12/03/18.    Assessment / Plan:      ICD-10-CM ICD-9-CM    1. Hypertension, unspecified type I10 401.9     2. Atrial fibrillation, unspecified type (HCC) I48.91 427.31    3. Blind in both eyes H54.3 369.00        Fasting labs next visit  Hypertension-blood pressure controlled  Follow-up in 6  months    Follow-up and Dispositions    ?? Return in about 6 months (around 06/04/2019).         I asked the patient if she  had any questions and answered her  questions.  The patient stated that she understands the treatment plan and agrees with the treatment plan    This document was created with a voice activated dictation system and may contain transcription errors.

## 2018-12-03 NOTE — Progress Notes (Signed)
 Visit Vitals  BP 115/67   Pulse 87   Temp 95.9 F (35.5 C) (Oral)   Resp 12   Ht 5' 5 (1.651 m)   Wt 176 lb (79.8 kg)   SpO2 99%   BMI 29.29 kg/m     Emily Henson presents today for   Chief Complaint   Patient presents with   . Hypertension     follow-up       Is someone accompanying this pt? no    Is the patient using any DME equipment during OV? no    Depression Screening:  3 most recent PHQ Screens 12/03/2018   Little interest or pleasure in doing things Not at all   Feeling down, depressed, irritable, or hopeless Not at all   Total Score PHQ 2 0       Learning Assessment:  Learning Assessment 08/21/2017   PRIMARY LEARNER Patient   HIGHEST LEVEL OF EDUCATION - PRIMARY LEARNER  GRADUATED HIGH SCHOOL OR GED   BARRIERS PRIMARY LEARNER VISUAL   CO-LEARNER CAREGIVER No   PRIMARY LANGUAGE ENGLISH   LEARNER PREFERENCE PRIMARY OTHER (COMMENT)   ANSWERED BY patient   RELATIONSHIP SELF       Abuse Screening:  Abuse Screening Questionnaire 08/21/2017   Do you ever feel afraid of your partner? N   Are you in a relationship with someone who physically or mentally threatens you? N   Is it safe for you to go home? Y       Fall Risk  Fall Risk Assessment, last 12 mths 12/03/2018   Able to walk? Yes   Fall in past 12 months? No       Health Maintenance reviewed and discussed and ordered per Provider.    Health Maintenance Due   Topic Date Due   . Shingrix Vaccine Age 29> (1 of 2) 10/01/1985   . GLAUCOMA SCREENING Q2Y  10/01/2000   . Bone Densitometry (Dexa) Screening  10/01/2000   . BREAST CANCER SCRN MAMMOGRAM  10/05/2017           Coordination of Care:  1. Have you been to the ER, urgent care clinic since your last visit? Hospitalized since your last visit? no    2. Have you seen or consulted any other health care providers outside of the [facility] since your last visit? Include any pap smears or colon screening. no

## 2018-12-03 NOTE — Progress Notes (Signed)
Emily Henson is a 84 y.o. female presenting today for Hypertension (follow-up)    Hypertension    Pertinent negatives include no chest pain, no palpitations, no headaches and no dizziness.   :  Emily Henson presents to the office today for hypertension and hyperlipidemia follow-up care.  Patient presents today stating she feels well.  She denies any chest pain, palpitation lower extremity edema.  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  She and her husband plan to move to [place] with the daughter who is surgery for home.  Patient has a history of hypertension and notes that she is compliant with taking her medicines every day.  Her blood pressure is controlled today at 115/67.  She has a history of atrial fibrillation and is followed by Dr. Burnadette Peter.  She is negative for any chest pain, palpitation or lower extremity edema.      Review of Systems   Respiratory: Negative for cough.    Cardiovascular: Negative for chest pain, palpitations and leg swelling.   Gastrointestinal: Negative for abdominal pain, constipation and diarrhea.   Musculoskeletal: Negative for back pain and myalgias.   Neurological: Negative for dizziness and headaches.       No Known Allergies    Current Outpatient Medications   Medication Sig Dispense Refill   ??? fenofibrate nanocrystallized (TRICOR) 145 mg tablet Take 1 Tab by mouth daily. 90 Tab 1   ??? cyanocobalamin (VITAMIN B-12) 1,000 mcg tablet Take 1,000 mcg by mouth daily.     ??? cinnamon bark (CINNAMON PO) Take  by mouth.     ??? apixaban (ELIQUIS) 5 mg tablet Take 1 Tab by mouth two (2) times a day. 180 Tab 1   ??? hydroCHLOROthiazide (HYDRODIURIL) 12.5 mg tablet Take 1 Tab by mouth daily. 90 Tab 1   ??? metoprolol tartrate (LOPRESSOR) 25 mg tablet Take 1 Tab by mouth two (2) times a day. 180 Tab 2   ??? metoprolol tartrate (LOPRESSOR) 50 mg tablet Take 1 Tab by mouth two (2) times a day. 180 Tab 1   ??? meclizine (ANTIVERT) 25 mg tablet Take 1 Tab by mouth every six (6)  hours as needed for Dizziness. 120 Tab 1   ??? verapamil ER (VERELAN) 240 mg ER capsule Take 240 mg by mouth daily.     ??? cholecalciferol, vitamin D3, (VITAMIN D3) 2,000 unit tab Take 1 Tab by mouth daily. 30 Tab 2       Past Medical History:   Diagnosis Date   ??? Arrhythmia     Atrial Fibrillation   ??? Arthritis    ??? CAD (coronary artery disease)    ??? Hypercholesterolemia     hypertriglycerides   ??? Hypertension        Past Surgical History:   Procedure Laterality Date   ??? HX APPENDECTOMY     ??? HX BREAST REDUCTION  1990   ??? HX CHOLECYSTECTOMY     ??? HX COLECTOMY     ??? HX COLECTOMY     ??? HX COLONOSCOPY     ??? HX GYN      hysterectomy   ??? HX PACEMAKER         Social History     Socioeconomic History   ??? Marital status: MARRIED     Spouse name: Not on file   ??? Number of children: Not on file   ??? Years of education: Not on file   ??? Highest education level: Not on file  Occupational History   ??? Not on file   Social Needs   ??? Financial resource strain: Not on file   ??? Food insecurity:     Worry: Not on file     Inability: Not on file   ??? Transportation needs:     Medical: Not on file     Non-medical: Not on file   Tobacco Use   ??? Smoking status: Former Smoker   ??? Smokeless tobacco: Never Used   Substance and Sexual Activity   ??? Alcohol use: No   ??? Drug use: No   ??? Sexual activity: Never     Partners: Male   Lifestyle   ??? Physical activity:     Days per week: Not on file     Minutes per session: Not on file   ??? Stress: Not on file   Relationships   ??? Social connections:     Talks on phone: Not on file     Gets together: Not on file     Attends religious service: Not on file     Active member of club or organization: Not on file     Attends meetings of clubs or organizations: Not on file     Relationship status: Not on file   ??? Intimate partner violence:     Fear of current or ex partner: Not on file     Emotionally abused: Not on file     Physically abused: Not on file     Forced sexual activity: Not on file    Other Topics Concern   ??? Not on file   Social History Narrative   ??? Not on file       Patient does not have an advanced directive on file    Vitals:    12/03/18 1017   BP: 115/67   Pulse: 87   Resp: 12   Temp: 95.9 ??F (35.5 ??C)   TempSrc: Oral   SpO2: 99%   Weight: 176 lb (79.8 kg)   Height: 5\' 5"  (1.651 m)   PainSc:   0 - No pain       Physical Exam   Constitutional: She is oriented to person, place, and time. No distress.   Cardiovascular: Normal rate, regular rhythm and normal heart sounds.   Pulmonary/Chest: Effort normal and breath sounds normal.   Lymphadenopathy:     She has no cervical adenopathy.   Neurological: She is alert and oriented to person, place, and time.   Skin: Skin is warm.   Nursing note and vitals reviewed.      No visits with results within 3 Month(s) from this visit.   Latest known visit with results is:   Hospital Outpatient Visit on 04/22/2018   Component Date Value Ref Range Status   ??? WBC 04/22/2018 6.1  4.6 - 13.2 K/uL Final   ??? RBC 04/22/2018 4.85  4.20 - 5.30 M/uL Final   ??? HGB 04/22/2018 15.3  12.0 - 16.0 g/dL Final   ??? HCT 16/09/9603 47.7* 35.0 - 45.0 % Final   ??? MCV 04/22/2018 98.4* 74.0 - 97.0 FL Final   ??? MCH 04/22/2018 31.5  24.0 - 34.0 PG Final   ??? MCHC 04/22/2018 32.1  31.0 - 37.0 g/dL Final   ??? RDW 54/08/8118 13.7  11.6 - 14.5 % Final   ??? PLATELET 04/22/2018 227  135 - 420 K/uL Final   ??? MPV 04/22/2018 10.9  9.2 - 11.8 FL Final   ???  NEUTROPHILS 04/22/2018 61  40 - 73 % Final   ??? LYMPHOCYTES 04/22/2018 29  21 - 52 % Final   ??? MONOCYTES 04/22/2018 9  3 - 10 % Final   ??? EOSINOPHILS 04/22/2018 1  0 - 5 % Final   ??? BASOPHILS 04/22/2018 0  0 - 2 % Final   ??? ABS. NEUTROPHILS 04/22/2018 3.7  1.8 - 8.0 K/UL Final   ??? ABS. LYMPHOCYTES 04/22/2018 1.8  0.9 - 3.6 K/UL Final   ??? ABS. MONOCYTES 04/22/2018 0.6  0.05 - 1.2 K/UL Final   ??? ABS. EOSINOPHILS 04/22/2018 0.1  0.0 - 0.4 K/UL Final   ??? ABS. BASOPHILS 04/22/2018 0.0  0.0 - 0.1 K/UL Final   ??? DF 04/22/2018 AUTOMATED    Final    ??? Sodium 04/22/2018 140  136 - 145 mmol/L Final   ??? Potassium 04/22/2018 4.1  3.5 - 5.5 mmol/L Final   ??? Chloride 04/22/2018 105  100 - 108 mmol/L Final   ??? CO2 04/22/2018 28  21 - 32 mmol/L Final   ??? Anion gap 04/22/2018 7  3.0 - 18 mmol/L Final   ??? Glucose 04/22/2018 84  74 - 99 mg/dL Final   ??? BUN 82/95/621305/05/2018 10  7.0 - 18 MG/DL Final   ??? Creatinine 04/22/2018 0.63  0.6 - 1.3 MG/DL Final   ??? BUN/Creatinine ratio 04/22/2018 16  12 - 20   Final   ??? GFR est AA 04/22/2018 >60  >60 ml/min/1.10373m2 Final   ??? GFR est non-AA 04/22/2018 >60  >60 ml/min/1.2273m2 Final    Comment: (NOTE)  Estimated GFR is calculated using the Modification of Diet in Renal   Disease (MDRD) Study equation, reported for both African Americans   (GFRAA) and non-African Americans (GFRNA), and normalized to 1.10373m2   body surface area. The physician must decide which value applies to   the patient. The MDRD study equation should only be used in   individuals age 82 or older. It has not been validated for the   following: pregnant women, patients with serious comorbid conditions,   or on certain medications, or persons with extremes of body size,   muscle mass, or nutritional status.     ??? Calcium 04/22/2018 9.7  8.5 - 10.1 MG/DL Final   ??? LIPID PROFILE 04/22/2018        Final   ??? Cholesterol, total 04/22/2018 191  <200 MG/DL Final   ??? Triglyceride 04/22/2018 206* <150 MG/DL Final    Comment: The drugs N-acetylcysteine (NAC) and  Metamiszole have been found to cause falsely  low results in this chemical assay. Please  be sure to submit blood samples obtained  BEFORE administration of either of these  drugs to assure correct results.     ??? HDL Cholesterol 04/22/2018 52  40 - 60 MG/DL Final   ??? LDL, calculated 04/22/2018 97.8  0 - 100 MG/DL Final   ??? VLDL, calculated 04/22/2018 41.2  MG/DL Final   ??? CHOL/HDL Ratio 04/22/2018 3.7  0 - 5.0   Final       .No results found for any visits on 12/03/18.    Assessment / Plan:      ICD-10-CM ICD-9-CM     1. Hypertension, unspecified type I10 401.9    2. Atrial fibrillation, unspecified type (HCC) I48.91 427.31    3. Blind in both eyes H54.3 369.00        Fasting labs next visit  Hypertension-blood pressure controlled  Follow-up in 6  months    Follow-up and Dispositions    ?? Return in about 6 months (around 06/04/2019).         I asked the patient if she  had any questions and answered her  questions.  The patient stated that she understands the treatment plan and agrees with the treatment plan    This document was created with a voice activated dictation system and may contain transcription errors.

## 2018-12-03 NOTE — Progress Notes (Signed)
Visit Vitals  BP 115/67   Pulse 87   Temp 95.9 ??F (35.5 ??C) (Oral)   Resp 12   Ht 5' 5" (1.651 m)   Wt 176 lb (79.8 kg)   SpO2 99%   BMI 29.29 kg/m??     Emily Henson presents today for   Chief Complaint   Patient presents with   ??? Hypertension     follow-up       Is someone accompanying this pt? no    Is the patient using any DME equipment during OV? no    Depression Screening:  3 most recent PHQ Screens 12/03/2018   Little interest or pleasure in doing things Not at all   Feeling down, depressed, irritable, or hopeless Not at all   Total Score PHQ 2 0       Learning Assessment:  Learning Assessment 08/21/2017   PRIMARY LEARNER Patient   HIGHEST LEVEL OF EDUCATION - PRIMARY LEARNER  GRADUATED HIGH SCHOOL OR GED   BARRIERS PRIMARY LEARNER VISUAL   CO-LEARNER CAREGIVER No   PRIMARY LANGUAGE ENGLISH   LEARNER PREFERENCE PRIMARY OTHER (COMMENT)   ANSWERED BY patient   RELATIONSHIP SELF       Abuse Screening:  Abuse Screening Questionnaire 08/21/2017   Do you ever feel afraid of your partner? N   Are you in a relationship with someone who physically or mentally threatens you? N   Is it safe for you to go home? Y       Fall Risk  Fall Risk Assessment, last 12 mths 12/03/2018   Able to walk? Yes   Fall in past 12 months? No       Health Maintenance reviewed and discussed and ordered per Provider.    Health Maintenance Due   Topic Date Due   ??? Shingrix Vaccine Age 50> (1 of 2) 10/01/1985   ??? GLAUCOMA SCREENING Q2Y  10/01/2000   ??? Bone Densitometry (Dexa) Screening  10/01/2000   ??? BREAST CANCER SCRN MAMMOGRAM  10/05/2017           Coordination of Care:  1. Have you been to the ER, urgent care clinic since your last visit? Hospitalized since your last visit? no    2. Have you seen or consulted any other health care providers outside of the [facility] since your last visit? Include any pap smears or colon screening. no

## 2019-04-02 ENCOUNTER — Encounter

## 2019-04-02 NOTE — Telephone Encounter (Signed)
Requested Prescriptions     Pending Prescriptions Disp Refills   ??? metoprolol tartrate (LOPRESSOR) 50 mg tablet 180 Tab 1     Sig: Take 1 Tab by mouth two (2) times a day.

## 2019-04-03 ENCOUNTER — Inpatient Hospital Stay: Admit: 2019-04-03 | Payer: MEDICARE | Primary: Family

## 2019-04-03 DIAGNOSIS — I10 Essential (primary) hypertension: Secondary | ICD-10-CM

## 2019-04-03 LAB — LIPID PANEL
CHOL/HDL Ratio: 3.4 (ref 0–5.0)
Chol/HDL Ratio: 3.4 (ref 0–5.0)
Cholesterol, Total: 161 MG/DL (ref <200)
Cholesterol, total: 161 MG/DL (ref <200)
HDL Cholesterol: 47 MG/DL (ref 40–60)
HDL: 47 MG/DL (ref 40–60)
LDL Calculated: 82.6 MG/DL (ref 0–100)
LDL, calculated: 82.6 MG/DL (ref 0–100)
Triglyceride: 157 MG/DL — ABNORMAL HIGH (ref <150)
Triglycerides: 157 MG/DL — ABNORMAL HIGH (ref <150)
VLDL Cholesterol Calculated: 31.4 MG/DL
VLDL, calculated: 31.4 MG/DL

## 2019-04-03 LAB — METABOLIC PANEL, COMPREHENSIVE
A-G Ratio: 1 (ref 0.8–1.7)
ALT (SGPT): 26 U/L (ref 13–56)
AST (SGOT): 20 U/L (ref 10–38)
Albumin: 3.5 g/dL (ref 3.4–5.0)
Alk. phosphatase: 63 U/L (ref 45–117)
Anion gap: 6 mmol/L (ref 3.0–18)
BUN/Creatinine ratio: 20 (ref 12–20)
BUN: 17 MG/DL (ref 7.0–18)
Bilirubin, total: 0.5 MG/DL (ref 0.2–1.0)
CO2: 26 mmol/L (ref 21–32)
Calcium: 10.3 MG/DL — ABNORMAL HIGH (ref 8.5–10.1)
Chloride: 110 mmol/L (ref 100–111)
Creatinine: 0.85 MG/DL (ref 0.6–1.3)
GFR est AA: >60 mL/min/{1.73_m2} (ref >60)
GFR est non-AA: >60 mL/min/{1.73_m2} (ref >60)
Globulin: 3.5 g/dL (ref 2.0–4.0)
Glucose: 99 mg/dL (ref 74–99)
Potassium: 4.2 mmol/L (ref 3.5–5.5)
Protein, total: 7 g/dL (ref 6.4–8.2)
Sodium: 142 mmol/L (ref 136–145)

## 2019-04-03 LAB — CBC WITH AUTOMATED DIFF
ABS. BASOPHILS: 0 10*3/uL (ref 0.0–0.1)
ABS. EOSINOPHILS: 0.1 10*3/uL (ref 0.0–0.4)
ABS. LYMPHOCYTES: 2 10*3/uL (ref 0.9–3.6)
ABS. MONOCYTES: 0.7 10*3/uL (ref 0.05–1.2)
ABS. NEUTROPHILS: 4.9 10*3/uL (ref 1.8–8.0)
BASOPHILS: 0 % (ref 0–2)
EOSINOPHILS: 1 % (ref 0–5)
HCT: 45 % (ref 35.0–45.0)
HGB: 15.4 g/dL (ref 12.0–16.0)
LYMPHOCYTES: 27 % (ref 21–52)
MCH: 32.6 PG (ref 24.0–34.0)
MCHC: 34.2 g/dL (ref 31.0–37.0)
MCV: 95.3 FL (ref 74.0–97.0)
MONOCYTES: 9 % (ref 3–10)
MPV: 10.8 FL (ref 9.2–11.8)
NEUTROPHILS: 63 % (ref 40–73)
PLATELET: 266 10*3/uL (ref 135–420)
RBC: 4.72 M/uL (ref 4.20–5.30)
RDW: 13.5 % (ref 11.6–14.5)
WBC: 7.6 10*3/uL (ref 4.6–13.2)

## 2019-04-03 LAB — CBC WITH AUTO DIFFERENTIAL
Basophils %: 0 % (ref 0–2)
Basophils Absolute: 0 10*3/uL (ref 0.0–0.1)
Eosinophils %: 1 % (ref 0–5)
Eosinophils Absolute: 0.1 10*3/uL (ref 0.0–0.4)
Hematocrit: 45 % (ref 35.0–45.0)
Hemoglobin: 15.4 g/dL (ref 12.0–16.0)
Lymphocytes %: 27 % (ref 21–52)
Lymphocytes Absolute: 2 10*3/uL (ref 0.9–3.6)
MCH: 32.6 PG (ref 24.0–34.0)
MCHC: 34.2 g/dL (ref 31.0–37.0)
MCV: 95.3 FL (ref 74.0–97.0)
MPV: 10.8 FL (ref 9.2–11.8)
Monocytes %: 9 % (ref 3–10)
Monocytes Absolute: 0.7 10*3/uL (ref 0.05–1.2)
Neutrophils %: 63 % (ref 40–73)
Neutrophils Absolute: 4.9 10*3/uL (ref 1.8–8.0)
Platelets: 266 10*3/uL (ref 135–420)
RBC: 4.72 M/uL (ref 4.20–5.30)
RDW: 13.5 % (ref 11.6–14.5)
WBC: 7.6 10*3/uL (ref 4.6–13.2)

## 2019-04-03 LAB — COMPREHENSIVE METABOLIC PANEL
ALT: 26 U/L (ref 13–56)
AST: 20 U/L (ref 10–38)
Albumin/Globulin Ratio: 1 (ref 0.8–1.7)
Albumin: 3.5 g/dL (ref 3.4–5.0)
Alkaline Phosphatase: 63 U/L (ref 45–117)
Anion Gap: 6 mmol/L (ref 3.0–18)
BUN: 17 MG/DL (ref 7.0–18)
Bun/Cre Ratio: 20 (ref 12–20)
CO2: 26 mmol/L (ref 21–32)
Calcium: 10.3 MG/DL — ABNORMAL HIGH (ref 8.5–10.1)
Chloride: 110 mmol/L (ref 100–111)
Creatinine: 0.85 MG/DL (ref 0.6–1.3)
EGFR IF NonAfrican American: >60 mL/min/{1.73_m2} (ref >60)
GFR African American: >60 mL/min/{1.73_m2} (ref >60)
Globulin: 3.5 g/dL (ref 2.0–4.0)
Glucose: 99 mg/dL (ref 74–99)
Potassium: 4.2 mmol/L (ref 3.5–5.5)
Sodium: 142 mmol/L (ref 136–145)
Total Bilirubin: 0.5 MG/DL (ref 0.2–1.0)
Total Protein: 7 g/dL (ref 6.4–8.2)

## 2019-04-04 MED ORDER — METOPROLOL TARTRATE 50 MG TAB
50 mg | ORAL_TABLET | Freq: Two times a day (BID) | ORAL | 1 refills | Status: DC
Start: 2019-04-04 — End: 2019-04-22

## 2019-04-08 ENCOUNTER — Telehealth: Admit: 2019-04-08 | Discharge: 2019-04-08 | Payer: MEDICARE | Attending: Family | Primary: Family

## 2019-04-08 ENCOUNTER — Encounter: Attending: Family | Primary: Family

## 2019-04-08 ENCOUNTER — Telehealth: Attending: Family | Primary: Family

## 2019-04-08 DIAGNOSIS — I10 Essential (primary) hypertension: Secondary | ICD-10-CM

## 2019-04-08 MED ORDER — APIXABAN 5 MG TABLET
5 mg | ORAL_TABLET | Freq: Two times a day (BID) | ORAL | 1 refills | Status: DC
Start: 2019-04-08 — End: 2019-04-22

## 2019-04-08 MED ORDER — HYDROCHLOROTHIAZIDE 12.5 MG TAB
12.5 mg | ORAL_TABLET | Freq: Every day | ORAL | 1 refills | Status: DC
Start: 2019-04-08 — End: 2019-04-22

## 2019-04-08 MED ORDER — METOPROLOL TARTRATE 25 MG TAB
25 mg | ORAL_TABLET | Freq: Two times a day (BID) | ORAL | 1 refills | Status: DC
Start: 2019-04-08 — End: 2019-04-22

## 2019-04-08 MED ORDER — FENOFIBRATE NANOCRYSTALLIZED 145 MG TAB
145 mg | ORAL_TABLET | Freq: Every day | ORAL | 1 refills | Status: DC
Start: 2019-04-08 — End: 2019-04-22

## 2019-04-08 NOTE — Progress Notes (Signed)
Consent: Emily Henson, who was seen by synchronous (real-time) audio-video technology, and/or her healthcare decision maker, is aware that this patient-initiated, Telehealth encounter on 04/08/2019 is a billable service, with coverage as determined by her insurance carrier. She is aware that she may receive a bill and has provided verbal consent to proceed: Yes.    AV via [email or web address]  Assessment & Plan:   Diagnoses and all orders for this visit:    1. Hypertension, unspecified type  -     metoprolol tartrate (LOPRESSOR) 25 mg tablet; Take 1 Tab by mouth two (2) times a day.  -     hydroCHLOROthiazide (HYDRODIURIL) 12.5 mg tablet; Take 1 Tab by mouth daily.    2. Hypertriglyceridemia  -     fenofibrate nanocrystallized (TRICOR) 145 mg tablet; Take 1 Tab by mouth daily.    3. Atrial fibrillation, unspecified type (HCC)  -     apixaban (ELIQUIS) 5 mg tablet; Take 1 Tab by mouth two (2) times a day.        Follow-up and Dispositions    ?? Return in about 4 months (around 08/08/2019), or if symptoms worsen or fail to improve.       I spent at least 15 minutes with this established patient, and >50% of the time was spent counseling and/or coordinating care regarding medical evalution, treatment plan, follow-up care  [id number]  Subjective:   Emily Henson is a 85 y.o. female who was seen for Hypertension    The patient presents for an Audio-visual teleconference appointment for hypertension, triglyceridemia and atrial fibrillation follow-up care.  She feels well today and denies any chest pain, palpitation or lower extremity edema.  She is compliant with taking her medication daily and is requesting medication refills.  Hypertension-her blood pressure is mildly elevated today but her last BP in the practice was 115/67.  Atrial fibrillation-her last cardiology visit was in July, 2019.  She is scheduled for annual visits with the cardiologist.  She is prescribed Eliquis twice daily.    Prior to Admission medications    Medication Sig  Start Date End Date Taking? Authorizing Provider   metoprolol tartrate (LOPRESSOR) 25 mg tablet Take 1 Tab by mouth two (2) times a day. 04/08/19  Yes Paula Libra, NP   hydroCHLOROthiazide (HYDRODIURIL) 12.5 mg tablet Take 1 Tab by mouth daily. 04/08/19  Yes Paula Libra, NP   fenofibrate nanocrystallized (TRICOR) 145 mg tablet Take 1 Tab by mouth daily. 04/08/19  Yes Paula Libra, NP   apixaban (ELIQUIS) 5 mg tablet Take 1 Tab by mouth two (2) times a day. 04/08/19  Yes Paula Libra, NP   metoprolol tartrate (LOPRESSOR) 50 mg tablet Take 1 Tab by mouth two (2) times a day. 04/04/19  Yes Paula Libra, NP   cyanocobalamin (VITAMIN B-12) 1,000 mcg tablet Take 1,000 mcg by mouth daily.   Yes Provider, Historical   cinnamon bark (CINNAMON PO) Take  by mouth.   Yes Provider, Historical   meclizine (ANTIVERT) 25 mg tablet Take 1 Tab by mouth every six (6) hours as needed for Dizziness. 07/04/18  Yes Paula Libra, NP   verapamil ER (VERELAN) 240 mg ER capsule Take 240 mg by mouth daily. 08/02/17  Yes Provider, Historical   cholecalciferol, vitamin D3, (VITAMIN D3) 2,000 unit tab Take 1 Tab by mouth daily. 09/26/17  Yes Paula Libra, NP   fenofibrate nanocrystallized (TRICOR) 145 mg tablet Take 1 Tab by mouth daily. 11/08/18 04/08/19  Paula Libra, NP   apixaban (ELIQUIS) 5 mg tablet Take 1 Tab by mouth two (2) times a day. 09/24/18 04/08/19  Paula Libra, NP   hydroCHLOROthiazide (HYDRODIURIL) 12.5 mg tablet Take 1 Tab by mouth daily. 09/24/18 04/08/19  Paula Libra, NP   metoprolol tartrate (LOPRESSOR) 25 mg tablet Take 1 Tab by mouth two (2) times a day. 09/24/18 04/08/19  Paula Libra, NP     No Known Allergies    Patient Active Problem List   Diagnosis Code   ??? 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 Atrial fibrillation (HCC) I48.91   ??? Pacemaker Z95.0   ??? Hypertension I10   ??? Hypertriglyceridemia E78.1     Patient Active Problem List     Diagnosis Date Noted   ??? Blind in both eyes 06/05/2017   ??? Atrial fibrillation (HCC) 06/05/2017   ??? Pacemaker 06/05/2017   ??? Hypertension 06/05/2017   ??? Hypertriglyceridemia 06/05/2017     Current Outpatient Medications   Medication Sig Dispense Refill   ??? metoprolol tartrate (LOPRESSOR) 25 mg tablet Take 1 Tab by mouth two (2) times a day. 180 Tab 1   ??? hydroCHLOROthiazide (HYDRODIURIL) 12.5 mg tablet Take 1 Tab by mouth daily. 180 Tab 1   ??? fenofibrate nanocrystallized (TRICOR) 145 mg tablet Take 1 Tab by mouth daily. 180 Tab 1   ??? apixaban (ELIQUIS) 5 mg tablet Take 1 Tab by mouth two (2) times a day. 180 Tab 1   ??? metoprolol tartrate (LOPRESSOR) 50 mg tablet Take 1 Tab by mouth two (2) times a day. 180 Tab 1   ??? cyanocobalamin (VITAMIN B-12) 1,000 mcg tablet Take 1,000 mcg by mouth daily.     ??? cinnamon bark (CINNAMON PO) Take  by mouth.     ??? meclizine (ANTIVERT) 25 mg tablet Take 1 Tab by mouth every six (6) hours as needed for Dizziness. 120 Tab 1   ??? verapamil ER (VERELAN) 240 mg ER capsule Take 240 mg by mouth daily.     ??? cholecalciferol, vitamin D3, (VITAMIN D3) 2,000 unit tab Take 1 Tab by mouth daily. 30 Tab 2     No Known Allergies  Past Medical History:   Diagnosis Date   ??? Arrhythmia     Atrial Fibrillation   ??? Arthritis    ??? CAD (coronary artery disease)    ??? Hypercholesterolemia     hypertriglycerides   ??? Hypertension        ROS    Constitutional: No apparent distress noted  General- negative for fever, chills or fatigue  Eyes- negative visual changes  CV- denies chest pain, palpitation  Pul: negative cough or SOB  GI: negative nausea, flank pain, diarrhea, constipation  Urinary:- No dysuria or polyuria  MS- negative myalgia, negative joint pain  Neuro- negative headache, dizziness or weakness  Skin- negative for rashes or lesions.      Objective:   Vital Signs: (As obtained by patient/caregiver at home)  Visit Vitals  BP (!) 112/95   Pulse 91        [INSTRUCTIONS:  " " Indicates a positive item   " " Indicates a negative item  -- DELETE ALL ITEMS NOT EXAMINED]    Constitutional:  Appears well-developed and well-nourished  No apparent distress       Abnormal -     Mental status:  Alert and awake   Oriented to person/place/time  Able to follow commands     Abnormal -  Eyes:   EOM    []   Normal    []  Abnormal -   Sclera  [x]   Normal    []  Abnormal -          Discharge [x]   None visible   []  Abnormal -   Patient is blind    HENT: [x]  Normocephalic, atraumatic  []  Abnormal -   [x]  Mouth/Throat: Mucous membranes are moist    External Ears [x]  Normal  []  Abnormal -    Neck: [x]  No visualized mass []  Abnormal -     Pulmonary/Chest: [x]  Respiratory effort normal   [x]  No visualized signs of difficulty breathing or respiratory distress        []  Abnormal -      Musculoskeletal:   [x]  Normal gait with no signs of ataxia         [x]  Normal range of motion of neck        []  Abnormal -     Neurological:        [x]  No Facial Asymmetry (Cranial nerve 7 motor function) (limited exam due to video visit)          [x]  No gaze palsy        []  Abnormal -          Skin:        [x]  No significant exanthematous lesions or discoloration noted on facial skin         []  Abnormal -            Psychiatric:       [x]  Normal Affect []  Abnormal -        [x]  No Hallucinations    Other pertinent observable physical exam findings:-        We discussed the expected course, resolution and complications of the diagnosis(es) in detail.  Medication risks, benefits, costs, interactions, and alternatives were discussed as indicated.  I advised her to contact the office if her condition worsens, changes or fails to improve as anticipated. She expressed understanding with the diagnosis(es) and plan.       Richardo PriestMattie Bhardwaj is a 85 y.o. female being evaluated by a video visit encounter for concerns as above.  A caregiver was present when appropriate. Due to this being a Scientist, research (medical)TeleHealth encounter (During COVID-19 public health emergency),  evaluation of the following organ systems was limited: Vitals/Constitutional/EENT/Resp/CV/GI/GU/MS/Neuro/Skin/Heme-Lymph-Imm.  Pursuant to the emergency declaration under the [place] Act and the [employer] Emergencies Act, 1135 waiver authority and the [employer] Technologies[employer]onavirus Preparedness and [employer]ponse Supplemental Appropriations Act, this Virtual  Visit was conducted, with patient's (and/or legal guardian's) consent, to reduce the patient's risk of exposure to COVID-19 and provide necessary medical care.     Services were provided through a video synchronous discussion virtually to substitute for in-person clinic visit.   Patient and provider were located at their individual homes.        Paula LibraKaren A Fields-Sykes, NP

## 2019-04-08 NOTE — Progress Notes (Signed)
Consent: Emily Henson, who was seen by synchronous (real-time) audio-video technology, and/or her healthcare decision maker, is aware that this patient-initiated, Telehealth encounter on 04/08/2019 is a billable service, with coverage as determined by her insurance carrier. She is aware that she may receive a bill and has provided verbal consent to proceed: Yes.    AV via [email or web address]  Assessment & Plan:   Diagnoses and all orders for this visit:    1. Hypertension, unspecified type  -     metoprolol tartrate (LOPRESSOR) 25 mg tablet; Take 1 Tab by mouth two (2) times a day.  -     hydroCHLOROthiazide (HYDRODIURIL) 12.5 mg tablet; Take 1 Tab by mouth daily.    2. Hypertriglyceridemia  -     fenofibrate nanocrystallized (TRICOR) 145 mg tablet; Take 1 Tab by mouth daily.    3. Atrial fibrillation, unspecified type (HCC)  -     apixaban (ELIQUIS) 5 mg tablet; Take 1 Tab by mouth two (2) times a day.        Follow-up and Dispositions    ?? Return in about 4 months (around 08/08/2019), or if symptoms worsen or fail to improve.       I spent at least 15 minutes with this established patient, and >50% of the time was spent counseling and/or coordinating care regarding medical evalution, treatment plan, follow-up care  [id number]  Subjective:   Emily Henson is a 85 y.o. female who was seen for Hypertension    The patient presents for an Audio-visual teleconference appointment for hypertension, triglyceridemia and atrial fibrillation follow-up care.  She feels well today and denies any chest pain, palpitation or lower extremity edema.  She is compliant with taking her medication daily and is requesting medication refills.  Hypertension-her blood pressure is mildly elevated today but her last BP in the practice was 115/67.  Atrial fibrillation-her last cardiology visit was in July, 2019.  She is scheduled for annual visits with the cardiologist.  She is prescribed Eliquis twice daily.    Prior to Admission medications     Medication Sig Start Date End Date Taking? Authorizing Provider   metoprolol tartrate (LOPRESSOR) 25 mg tablet Take 1 Tab by mouth two (2) times a day. 04/08/19  Yes Paula LibraFields-Sykes, Leshonda Galambos A, NP   hydroCHLOROthiazide (HYDRODIURIL) 12.5 mg tablet Take 1 Tab by mouth daily. 04/08/19  Yes Paula LibraFields-Sykes, Abu Heavin A, NP   fenofibrate nanocrystallized (TRICOR) 145 mg tablet Take 1 Tab by mouth daily. 04/08/19  Yes Paula LibraFields-Sykes, Audray Rumore A, NP   apixaban (ELIQUIS) 5 mg tablet Take 1 Tab by mouth two (2) times a day. 04/08/19  Yes Paula LibraFields-Sykes, Amaryah Mallen A, NP   metoprolol tartrate (LOPRESSOR) 50 mg tablet Take 1 Tab by mouth two (2) times a day. 04/04/19  Yes Paula LibraFields-Sykes, Dilana Mcphie A, NP   cyanocobalamin (VITAMIN B-12) 1,000 mcg tablet Take 1,000 mcg by mouth daily.   Yes Provider, Historical   cinnamon bark (CINNAMON PO) Take  by mouth.   Yes Provider, Historical   meclizine (ANTIVERT) 25 mg tablet Take 1 Tab by mouth every six (6) hours as needed for Dizziness. 07/04/18  Yes Paula LibraFields-Sykes, Tierra Divelbiss A, NP   verapamil ER (VERELAN) 240 mg ER capsule Take 240 mg by mouth daily. 08/02/17  Yes Provider, Historical   cholecalciferol, vitamin D3, (VITAMIN D3) 2,000 unit tab Take 1 Tab by mouth daily. 09/26/17  Yes Paula LibraFields-Sykes, Zanyiah Posten A, NP   fenofibrate nanocrystallized (TRICOR) 145 mg tablet Take 1 Tab by mouth daily. 11/08/18 04/08/19  Paula Libra, NP   apixaban (ELIQUIS) 5 mg tablet Take 1 Tab by mouth two (2) times a day. 09/24/18 04/08/19  Paula Libra, NP   hydroCHLOROthiazide (HYDRODIURIL) 12.5 mg tablet Take 1 Tab by mouth daily. 09/24/18 04/08/19  Paula Libra, NP   metoprolol tartrate (LOPRESSOR) 25 mg tablet Take 1 Tab by mouth two (2) times a day. 09/24/18 04/08/19  Paula Libra, NP     No Known Allergies    Patient Active Problem List   Diagnosis Code   ??? 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 Atrial fibrillation (HCC) I48.91   ??? Pacemaker Z95.0   ??? Hypertension I10   ??? Hypertriglyceridemia E78.1      Patient Active Problem List    Diagnosis Date Noted   ??? Blind in both eyes 06/05/2017   ??? Atrial fibrillation (HCC) 06/05/2017   ??? Pacemaker 06/05/2017   ??? Hypertension 06/05/2017   ??? Hypertriglyceridemia 06/05/2017     Current Outpatient Medications   Medication Sig Dispense Refill   ??? metoprolol tartrate (LOPRESSOR) 25 mg tablet Take 1 Tab by mouth two (2) times a day. 180 Tab 1   ??? hydroCHLOROthiazide (HYDRODIURIL) 12.5 mg tablet Take 1 Tab by mouth daily. 180 Tab 1   ??? fenofibrate nanocrystallized (TRICOR) 145 mg tablet Take 1 Tab by mouth daily. 180 Tab 1   ??? apixaban (ELIQUIS) 5 mg tablet Take 1 Tab by mouth two (2) times a day. 180 Tab 1   ??? metoprolol tartrate (LOPRESSOR) 50 mg tablet Take 1 Tab by mouth two (2) times a day. 180 Tab 1   ??? cyanocobalamin (VITAMIN B-12) 1,000 mcg tablet Take 1,000 mcg by mouth daily.     ??? cinnamon bark (CINNAMON PO) Take  by mouth.     ??? meclizine (ANTIVERT) 25 mg tablet Take 1 Tab by mouth every six (6) hours as needed for Dizziness. 120 Tab 1   ??? verapamil ER (VERELAN) 240 mg ER capsule Take 240 mg by mouth daily.     ??? cholecalciferol, vitamin D3, (VITAMIN D3) 2,000 unit tab Take 1 Tab by mouth daily. 30 Tab 2     No Known Allergies  Past Medical History:   Diagnosis Date   ??? Arrhythmia     Atrial Fibrillation   ??? Arthritis    ??? CAD (coronary artery disease)    ??? Hypercholesterolemia     hypertriglycerides   ??? Hypertension        ROS    Constitutional: No apparent distress noted  General- negative for fever, chills or fatigue  Eyes- negative visual changes  CV- denies chest pain, palpitation  Pul: negative cough or SOB  GI: negative nausea, flank pain, diarrhea, constipation  Urinary:- No dysuria or polyuria  MS- negative myalgia, negative joint pain  Neuro- negative headache, dizziness or weakness  Skin- negative for rashes or lesions.      Objective:   Vital Signs: (As obtained by patient/caregiver at home)  Visit Vitals  BP (!) 112/95   Pulse 91         [INSTRUCTIONS:  "[x] " Indicates a positive item  "[] " Indicates a negative item  -- DELETE ALL ITEMS NOT EXAMINED]    Constitutional: [x]  Appears well-developed and well-nourished [x]  No apparent distress      []  Abnormal -     Mental status: [x]  Alert and awake  [x]  Oriented to person/place/time [x]  Able to follow commands    []  Abnormal -  Eyes:   EOM      Normal     Abnormal -   Sclera    Normal     Abnormal -          Discharge   None visible    Abnormal -   Patient is blind    HENT:  Normocephalic, atraumatic   Abnormal -    Mouth/Throat: Mucous membranes are moist    External Ears  Normal   Abnormal -    Neck:  No visualized mass  Abnormal -     Pulmonary/Chest:  Respiratory effort normal    No visualized signs of difficulty breathing or respiratory distress         Abnormal -      Musculoskeletal:    Normal gait with no signs of ataxia          Normal range of motion of neck         Abnormal -     Neurological:         No Facial Asymmetry (Cranial nerve 7 motor function) (limited exam due to video visit)           No gaze palsy         Abnormal -          Skin:         No significant exanthematous lesions or discoloration noted on facial skin          Abnormal -            Psychiatric:        Normal Affect  Abnormal -         No Hallucinations    Other pertinent observable physical exam findings:-        We discussed the expected course, resolution and complications of the diagnosis(es) in detail.  Medication risks, benefits, costs, interactions, and alternatives were discussed as indicated.  I advised her to contact the office if her condition worsens, changes or fails to improve as anticipated. She expressed understanding with the diagnosis(es) and plan.       Emily Henson is a 85 y.o. female being evaluated by a video visit encounter for concerns as above.  A caregiver was present when  appropriate. Due to this being a Scientist, research (medical) (During COVID-19 public health emergency), evaluation of the following organ systems was limited: Vitals/Constitutional/EENT/Resp/CV/GI/GU/MS/Neuro/Skin/Heme-Lymph-Imm.  Pursuant to the emergency declaration under the [facility] Act and the [employer], 1135 waiver authority and the [employer] and [employer] Act, this Virtual  Visit was conducted, with patient's (and/or legal guardian's) consent, to reduce the patient's risk of exposure to COVID-19 and provide necessary medical care.     Services were provided through a video synchronous discussion virtually to substitute for in-person clinic visit.   Patient and provider were located at their individual homes.        Paula Libra, NP

## 2019-04-22 ENCOUNTER — Encounter

## 2019-04-22 MED ORDER — APIXABAN 5 MG TABLET
5 mg | ORAL_TABLET | Freq: Two times a day (BID) | ORAL | 1 refills | Status: DC
Start: 2019-04-22 — End: 2020-11-04

## 2019-04-22 MED ORDER — METOPROLOL TARTRATE 50 MG TAB
50 mg | ORAL_TABLET | Freq: Two times a day (BID) | ORAL | 1 refills | Status: DC
Start: 2019-04-22 — End: 2019-10-21

## 2019-04-22 MED ORDER — FENOFIBRATE NANOCRYSTALLIZED 145 MG TAB
145 mg | ORAL_TABLET | Freq: Every day | ORAL | 1 refills | Status: DC
Start: 2019-04-22 — End: 2020-04-21

## 2019-04-22 MED ORDER — METOPROLOL TARTRATE 25 MG TAB
25 mg | ORAL_TABLET | Freq: Two times a day (BID) | ORAL | 1 refills | Status: DC
Start: 2019-04-22 — End: 2019-10-21

## 2019-04-22 MED ORDER — HYDROCHLOROTHIAZIDE 12.5 MG TAB
12.5 mg | ORAL_TABLET | Freq: Every day | ORAL | 1 refills | Status: DC
Start: 2019-04-22 — End: 2020-05-04

## 2019-10-21 ENCOUNTER — Encounter

## 2019-10-21 NOTE — Telephone Encounter (Signed)
Last seen   04/08/19  Next appt    Being made    Requested Prescriptions     Pending Prescriptions Disp Refills   ??? metoprolol tartrate (LOPRESSOR) 25 mg tablet 180 Tab 1     Sig: Take 1 Tab by mouth two (2) times a day.   ??? metoprolol tartrate (LOPRESSOR) 50 mg tablet 180 Tab 1     Sig: Take 1 Tab by mouth two (2) times a day.

## 2019-10-23 MED ORDER — METOPROLOL TARTRATE 25 MG TAB
25 mg | ORAL_TABLET | Freq: Two times a day (BID) | ORAL | 1 refills | Status: DC
Start: 2019-10-23 — End: 2020-04-21

## 2019-10-23 MED ORDER — METOPROLOL TARTRATE 50 MG TAB
50 mg | ORAL_TABLET | Freq: Two times a day (BID) | ORAL | 1 refills | Status: DC
Start: 2019-10-23 — End: 2020-04-21

## 2019-11-27 ENCOUNTER — Telehealth: Admit: 2019-11-27 | Discharge: 2019-11-27 | Payer: MEDICARE | Attending: Family | Primary: Family

## 2019-11-27 ENCOUNTER — Telehealth: Attending: Family | Primary: Family

## 2019-11-27 DIAGNOSIS — Z0000 Encounter for general adult medical examination without abnormal findings: Secondary | ICD-10-CM

## 2019-11-27 NOTE — Progress Notes (Signed)
 Emily Henson presents today for   Chief Complaint   Patient presents with   . Hypertension       Virtual/telephone visit    Depression Screening:  3 most recent PHQ Screens 11/27/2019   Little interest or pleasure in doing things Not at all   Feeling down, depressed, irritable, or hopeless Not at all   Total Score PHQ 2 0       Learning Assessment:  Learning Assessment 08/21/2017   PRIMARY LEARNER Patient   HIGHEST LEVEL OF EDUCATION - PRIMARY LEARNER  GRADUATED HIGH SCHOOL OR GED   BARRIERS PRIMARY LEARNER VISUAL   CO-LEARNER CAREGIVER No   PRIMARY LANGUAGE ENGLISH   LEARNER PREFERENCE PRIMARY OTHER (COMMENT)   ANSWERED BY patient   RELATIONSHIP SELF       Abuse Screening:  Abuse Screening Questionnaire 08/21/2017   Do you ever feel afraid of your partner? N   Are you in a relationship with someone who physically or mentally threatens you? N   Is it safe for you to go home? Y       Fall Risk  Fall Risk Assessment, last 12 mths 11/27/2019   Able to walk? Yes   Fall in past 12 months? Yes   Fall with injury? Yes   Number of falls in past 12 months 1   Fall Risk Score 2         Health Maintenance reviewed and discussed and ordered per Provider.    Health Maintenance Due   Topic Date Due   . DTaP/Tdap/Td series (1 - Tdap) 10/01/1956   . Shingrix Vaccine Age 86> (1 of 2) 10/01/1985   . GLAUCOMA SCREENING Q2Y  10/01/2000   . Bone Densitometry (Dexa) Screening  10/01/2000   . Medicare Yearly Exam  04/30/2019   . Flu Vaccine (1) 08/19/2019   .      Coordination of Care:  1. Have you been to the ER, urgent care clinic since your last visit? Hospitalized since your last visit? no    2. Have you seen or consulted any other health care providers outside of the [facility] since your last visit? Include any pap smears or colon screening. no

## 2019-11-27 NOTE — Progress Notes (Signed)
This is the Subsequent Medicare Annual Wellness Exam, performed 12 months or more after the Initial AWV or the last Subsequent AWV    I have reviewed the patient's medical history in detail and updated the computerized patient record.     Depression Risk Factor Screening:     3 most recent PHQ Screens 11/27/2019   Little interest or pleasure in doing things Not at all   Feeling down, depressed, irritable, or hopeless Not at all   Total Score PHQ 2 0       Alcohol Risk Screen   Do you average more than 1 drink per night or more than 7 drinks a week:  No    On any one occasion in the past three months have you have had more than 3 drinks containing alcohol:  No        Functional Ability and Level of Safety:   Hearing: Hearing is good.     Activities of Daily Living:  The home contains: grab bars  Patient does total self care     Ambulation: with no difficulty     Fall Risk:  Fall Risk Assessment, last 12 mths 11/27/2019   Able to walk? Yes   Fall in past 12 months? Yes   Fall with injury? Yes   Number of falls in past 12 months 1   Fall Risk Score 2     Abuse Screen:  Patient is not abused       Cognitive Screening   Has your family/caregiver stated any concerns about your memory: no    Cognitive Screening: Normal - Normal recall    Assessment/Plan   Education and counseling provided:  Are appropriate based on today's review and evaluation    Diagnoses and all orders for this visit:    1. Essential hypertension  -     CBC WITH AUTOMATED DIFF; Future  -     LIPID PANEL; Future  -     METABOLIC PANEL, COMPREHENSIVE; Future    2. Hypertriglyceridemia  -     LIPID PANEL; Future    3. Blind in both eyes        Health Maintenance Due     Health Maintenance Due   Topic Date Due   ??? DTaP/Tdap/Td series (1 - Tdap) 10/01/1956   ??? Shingrix Vaccine Age 9> (1 of 2) 10/01/1985   ??? GLAUCOMA SCREENING Q2Y  10/01/2000   ??? Bone Densitometry (Dexa) Screening  10/01/2000   ??? Medicare Yearly Exam  04/30/2019   ??? Flu Vaccine (1)  08/19/2019       Patient Care Team   Patient Care Team:  Paula Libra, NP as PCP - General (Nurse Practitioner)  Paula Libra, NP as PCP - [facility] Empaneled Provider    History     Patient Active Problem List   Diagnosis Code   ??? 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 Atrial fibrillation (HCC) I48.91   ??? Pacemaker Z95.0   ??? Hypertension I10   ??? Hypertriglyceridemia E78.1     Past Medical History:   Diagnosis Date   ??? Arrhythmia     Atrial Fibrillation   ??? Arthritis    ??? CAD (coronary artery disease)    ??? Hypercholesterolemia     hypertriglycerides   ??? Hypertension       Past Surgical History:   Procedure Laterality Date   ??? HX APPENDECTOMY     ??? HX BREAST REDUCTION  1990   ???  HX CHOLECYSTECTOMY     ??? HX COLECTOMY     ??? HX COLECTOMY     ??? HX COLONOSCOPY     ??? HX GYN      hysterectomy   ??? HX PACEMAKER       Current Outpatient Medications   Medication Sig Dispense Refill   ??? metoprolol tartrate (LOPRESSOR) 25 mg tablet Take 1 Tab by mouth two (2) times a day. 180 Tab 1   ??? metoprolol tartrate (LOPRESSOR) 50 mg tablet Take 1 Tab by mouth two (2) times a day. 180 Tab 1   ??? hydroCHLOROthiazide (HYDRODIURIL) 12.5 mg tablet Take 1 Tab by mouth daily. 180 Tab 1   ??? fenofibrate nanocrystallized (TRICOR) 145 mg tablet Take 1 Tab by mouth daily. 180 Tab 1   ??? apixaban (ELIQUIS) 5 mg tablet Take 1 Tab by mouth two (2) times a day. 180 Tab 1   ??? cyanocobalamin (VITAMIN B-12) 1,000 mcg tablet Take 1,000 mcg by mouth daily.     ??? cinnamon bark (CINNAMON PO) Take  by mouth.     ??? meclizine (ANTIVERT) 25 mg tablet Take 1 Tab by mouth every six (6) hours as needed for Dizziness. 120 Tab 1   ??? verapamil ER (VERELAN) 240 mg ER capsule Take 240 mg by mouth daily.     ??? cholecalciferol, vitamin D3, (VITAMIN D3) 2,000 unit tab Take 1 Tab by mouth daily. 30 Tab 2     No Known Allergies    Family History   Problem Relation Age of Onset   ??? Diabetes Mother    ??? Hypertension Mother      Social History     Tobacco Use   ??? Smoking status:  Former Smoker   ??? Smokeless tobacco: Never Used   Substance Use Topics   ??? Alcohol use: No       Emily Henson, who was evaluated through a synchronous (real-time) audio only encounter, and/or her healthcare decision maker, is aware that it is a billable service, with coverage as determined by her insurance carrier. She provided verbal consent to proceed: n/a- consent obtained within past 12 months, and patient identification was verified. It was conducted pursuant to the emergency declaration under the [place] waiver authority and the [employer] Act. A caregiver was present when appropriate. Ability to conduct physical exam was limited. I was in the office. The patient was at home.    Candie Echevaria, NP

## 2019-11-27 NOTE — Progress Notes (Signed)
Emily Henson is a 85 y.o. female who was seen by synchronous (real-time) audio-video technology on 11/27/2019 for Hypertension    Assessment & Plan:   Diagnoses and all orders for this visit:    1. Essential hypertension  -     CBC WITH AUTOMATED DIFF; Future  -     LIPID PANEL; Future  -     METABOLIC PANEL, COMPREHENSIVE; Future    2. 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  She feels well today and denies any chest pain, palpitation or lower extremity edema.  She is compliant with taking her medication daily and is requesting medication refills.  Hypertension-her BP has been controlled at home and her caregiver/son monitors her BP daily.  BP today is 126/83 and her pulse is 89.  Atrial fibrillation-her last cardiology visit was in September, 2020  she has a medical diagnosis of sick sinus syndrome, cardiac pacemaker in situ, and permanent atrial fibrillation She is prescribed Eliquis twice daily.    Prior to Admission medications    Medication Sig Start Date End Date Taking? Authorizing Provider   metoprolol tartrate (LOPRESSOR) 25 mg tablet Take 1 Tab by mouth two (2) times a day. 10/23/19  Yes Paula Libra, NP   metoprolol tartrate (LOPRESSOR) 50 mg tablet Take 1 Tab by mouth two (2) times a day. 10/23/19  Yes Paula Libra, NP   hydroCHLOROthiazide (HYDRODIURIL) 12.5 mg tablet Take 1 Tab by mouth daily. 04/22/19  Yes Paula Libra, NP   fenofibrate nanocrystallized (TRICOR) 145 mg tablet Take 1 Tab by mouth daily. 04/22/19  Yes Paula Libra, NP   apixaban (ELIQUIS) 5 mg tablet Take 1 Tab by mouth two (2) times a day. 04/22/19  Yes Paula Libra, NP   cyanocobalamin (VITAMIN B-12) 1,000 mcg tablet Take 1,000 mcg by mouth daily.   Yes Provider, Historical   cinnamon bark (CINNAMON  PO) Take  by mouth.   Yes Provider, Historical   meclizine (ANTIVERT) 25 mg tablet Take 1 Tab by mouth every six (6) hours as needed for Dizziness. 07/04/18  Yes Paula Libra, NP   verapamil ER (VERELAN) 240 mg ER capsule Take 240 mg by mouth daily. 08/02/17  Yes Provider, Historical   cholecalciferol, vitamin D3, (VITAMIN D3) 2,000 unit tab Take 1 Tab by mouth daily. 09/26/17  Yes Paula Libra, NP     Patient Active Problem List   Diagnosis Code   ??? 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 Atrial fibrillation (HCC) I48.91   ??? Pacemaker Z95.0   ??? Hypertension I10   ??? Hypertriglyceridemia E78.1     Patient Active Problem List    Diagnosis Date Noted   ??? Blind in both eyes 06/05/2017   ??? Atrial fibrillation (HCC) 06/05/2017   ??? Pacemaker 06/05/2017   ??? Hypertension 06/05/2017   ??? Hypertriglyceridemia 06/05/2017     Current Outpatient Medications   Medication Sig Dispense Refill   ??? metoprolol tartrate (LOPRESSOR) 25 mg tablet Take 1 Tab by mouth two (2) times a day. 180 Tab 1   ??? metoprolol tartrate (LOPRESSOR) 50 mg tablet Take 1 Tab by mouth two (2) times a day. 180 Tab 1   ??? hydroCHLOROthiazide (HYDRODIURIL) 12.5 mg tablet  Take 1 Tab by mouth daily. 180 Tab 1   ??? fenofibrate nanocrystallized (TRICOR) 145 mg tablet Take 1 Tab by mouth daily. 180 Tab 1   ??? apixaban (ELIQUIS) 5 mg tablet Take 1 Tab by mouth two (2) times a day. 180 Tab 1   ??? cyanocobalamin (VITAMIN B-12) 1,000 mcg tablet Take 1,000 mcg by mouth daily.     ??? cinnamon bark (CINNAMON PO) Take  by mouth.     ??? meclizine (ANTIVERT) 25 mg tablet Take 1 Tab by mouth every six (6) hours as needed for Dizziness. 120 Tab 1   ??? verapamil ER (VERELAN) 240 mg ER capsule Take 240 mg by mouth daily.     ??? cholecalciferol, vitamin D3, (VITAMIN D3) 2,000 unit tab Take 1 Tab by mouth daily. 30 Tab 2       ROS    Constitutional: No apparent distress noted  General- negative for fever, chills or fatigue  Eyes- blind in both eyes  CV- denies chest pain,  palpitation  Pul: negative cough or SOB  GI: negative nausea, flank pain, diarrhea, constipation  Urinary:- No dysuria or polyuria  MS- negative myalgia, negative joint pain  Neuro- negative headache, dizziness or weakness  Skin- negative for rashes or lesions.  Psych- denies any anxiety or depression    Objective:     Patient-Reported Vitals 11/27/2019   Patient-Reported Weight 186   Patient-Reported Pulse 89   Patient-Reported Temperature 98.1   Patient-Reported Systolic  657   Patient-Reported Diastolic 83        [INSTRUCTIONS:  "[x] " Indicates a positive item  "[] " Indicates a negative item  -- DELETE ALL ITEMS NOT EXAMINED]    Constitutional: [x]  Appears well-developed and well-nourished [x]  No apparent distress      []  Abnormal -     Mental status: [x]  Alert and awake  [x]  Oriented to person/place/time [x]  Able to follow commands    []  Abnormal -     Eyes:   EOM    [x]   Normal    []  Abnormal -   Sclera  [x]   Normal    []  Abnormal -          Discharge [x]   None visible   []  Abnormal -     HENT: [x]  Normocephalic, atraumatic  []  Abnormal -   [x]  Mouth/Throat: Mucous membranes are moist    External Ears [x]  Normal  []  Abnormal -    Neck: [x]  No visualized mass []  Abnormal -     Pulmonary/Chest: [x]  Respiratory effort normal   [x]  No visualized signs of difficulty breathing or respiratory distress        []  Abnormal -      Musculoskeletal:   [x]  Normal gait with no signs of ataxia         [x]  Normal range of motion of neck        []  Abnormal -     Neurological:        [x]  No Facial Asymmetry (Cranial nerve 7 motor function) (limited exam due to video visit)          [x]  No gaze palsy        []  Abnormal -          Skin:        [x]  No significant exanthematous lesions or discoloration noted on facial skin         []  Abnormal -  Psychiatric:       [x]  Normal Affect []  Abnormal -        [x]  No Hallucinations    Other pertinent observable physical exam findings:-        We discussed the expected course,  resolution and complications of the diagnosis(es) in detail.  Medication risks, benefits, costs, interactions, and alternatives were discussed as indicated.  I advised her to contact the office if her condition worsens, changes or fails to improve as anticipated. She expressed understanding with the diagnosis(es) and plan.       Emily Henson, who was evaluated through a patient-initiated, synchronous (real-time) audio-video encounter, and/or her healthcare decision maker, is aware that it is a billable service, with coverage as determined by her insurance carrier. She provided verbal consent to proceed: Yes, and patient identification was verified. It was conducted pursuant to the emergency declaration under the [employer] Act and the [employer] Act, 1135 waiver authority and the [employer] and [employer] Supplemental Appropriations Act. A caregiver was present when appropriate. Ability to conduct physical exam was limited. I was at home. The patient was at home.      Paula LibraKaren A Fields-Sykes, NP

## 2019-11-27 NOTE — Progress Notes (Signed)
This is the Subsequent Medicare Annual Wellness Exam, performed 12 months or more after the Initial AWV or the last Subsequent AWV    I have reviewed the patient's medical history in detail and updated the computerized patient record.     Depression Risk Factor Screening:     3 most recent PHQ Screens 11/27/2019   Little interest or pleasure in doing things Not at all   Feeling down, depressed, irritable, or hopeless Not at all   Total Score PHQ 2 0       Alcohol Risk Screen   Do you average more than 1 drink per night or more than 7 drinks a week:  No    On any one occasion in the past three months have you have had more than 3 drinks containing alcohol:  No        Functional Ability and Level of Safety:   Hearing: Hearing is good.     Activities of Daily Living:  The home contains: grab bars  Patient does total self care     Ambulation: with no difficulty     Fall Risk:  Fall Risk Assessment, last 12 mths 11/27/2019   Able to walk? Yes   Fall in past 12 months? Yes   Fall with injury? Yes   Number of falls in past 12 months 1   Fall Risk Score 2     Abuse Screen:  Patient is not abused       Cognitive Screening   Has your family/caregiver stated any concerns about your memory: no    Cognitive Screening: Normal - Normal recall    Assessment/Plan   Education and counseling provided:  Are appropriate based on today's review and evaluation    Diagnoses and all orders for this visit:    1. Essential hypertension  -     CBC WITH AUTOMATED DIFF; Future  -     LIPID PANEL; Future  -     METABOLIC PANEL, COMPREHENSIVE; Future    2. Hypertriglyceridemia  -     LIPID PANEL; Future    3. Blind in both eyes        Health Maintenance Due     Health Maintenance Due   Topic Date Due   ??? DTaP/Tdap/Td series (1 - Tdap) 10/01/1956   ??? Shingrix Vaccine Age 46> (1 of 2) 10/01/1985   ??? GLAUCOMA SCREENING Q2Y  10/01/2000   ??? Bone Densitometry (Dexa) Screening  10/01/2000   ??? Medicare Yearly Exam  04/30/2019    ??? Flu Vaccine (1) 08/19/2019       Patient Care Team   Patient Care Team:  Candie Echevaria, NP as PCP - General (Nurse Practitioner)  Candie Echevaria, NP as PCP - [facility] Empaneled Provider    History     Patient Active Problem List   Diagnosis Code   ??? 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 Atrial fibrillation (HCC) I48.91   ??? Pacemaker Z95.0   ??? Hypertension I10   ??? Hypertriglyceridemia E78.1     Past Medical History:   Diagnosis Date   ??? Arrhythmia     Atrial Fibrillation   ??? Arthritis    ??? CAD (coronary artery disease)    ??? Hypercholesterolemia     hypertriglycerides   ??? Hypertension       Past Surgical History:   Procedure Laterality Date   ??? HX APPENDECTOMY     ??? HX BREAST REDUCTION  1990   ???  HX CHOLECYSTECTOMY     ??? HX COLECTOMY     ??? HX COLECTOMY     ??? HX COLONOSCOPY     ??? HX GYN      hysterectomy   ??? HX PACEMAKER       Current Outpatient Medications   Medication Sig Dispense Refill   ??? metoprolol tartrate (LOPRESSOR) 25 mg tablet Take 1 Tab by mouth two (2) times a day. 180 Tab 1   ??? metoprolol tartrate (LOPRESSOR) 50 mg tablet Take 1 Tab by mouth two (2) times a day. 180 Tab 1   ??? hydroCHLOROthiazide (HYDRODIURIL) 12.5 mg tablet Take 1 Tab by mouth daily. 180 Tab 1   ??? fenofibrate nanocrystallized (TRICOR) 145 mg tablet Take 1 Tab by mouth daily. 180 Tab 1   ??? apixaban (ELIQUIS) 5 mg tablet Take 1 Tab by mouth two (2) times a day. 180 Tab 1   ??? cyanocobalamin (VITAMIN B-12) 1,000 mcg tablet Take 1,000 mcg by mouth daily.     ??? cinnamon bark (CINNAMON PO) Take  by mouth.     ??? meclizine (ANTIVERT) 25 mg tablet Take 1 Tab by mouth every six (6) hours as needed for Dizziness. 120 Tab 1   ??? verapamil ER (VERELAN) 240 mg ER capsule Take 240 mg by mouth daily.     ??? cholecalciferol, vitamin D3, (VITAMIN D3) 2,000 unit tab Take 1 Tab by mouth daily. 30 Tab 2     No Known Allergies    Family History   Problem Relation Age of Onset   ??? Diabetes Mother    ??? Hypertension Mother      Social History     Tobacco Use    ??? Smoking status: Former Smoker   ??? Smokeless tobacco: Never Used   Substance Use Topics   ??? Alcohol use: No       Emily Henson, who was evaluated through a synchronous (real-time) audio only encounter, and/or her healthcare decision maker, is aware that it is a billable service, with coverage as determined by her insurance carrier. She provided verbal consent to proceed: n/a- consent obtained within past 12 months, and patient identification was verified. It was conducted pursuant to the emergency declaration under the [employer] and the [employer], 1135 waiver authority and the [employer] and [employer] Act. A caregiver was present when appropriate. Ability to conduct physical exam was limited. I was in the office. The patient was at home.    Emily Libra, NP

## 2019-11-27 NOTE — Progress Notes (Signed)
Emily Henson is a 85 y.o. female who was seen by synchronous (real-time) audio-video technology on 11/27/2019 for Hypertension    Assessment & Plan:   Diagnoses and all orders for this visit:    1. Essential hypertension  -     CBC WITH AUTOMATED DIFF; Future  -     LIPID PANEL; Future  -     METABOLIC PANEL, COMPREHENSIVE; Future    2. 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  She feels well today and denies any chest pain, palpitation or lower extremity edema.  She is compliant with taking her medication daily and is requesting medication refills.  Hypertension-her BP has been controlled at home and her caregiver/son monitors her BP daily.  BP today is 126/83 and her pulse is 89.  Atrial fibrillation-her last cardiology visit was in September, 2020  she has a medical diagnosis of sick sinus syndrome, cardiac pacemaker in situ, and permanent atrial fibrillation She is prescribed Eliquis twice daily.    Prior to Admission medications    Medication Sig Start Date End Date Taking? Authorizing Provider   metoprolol tartrate (LOPRESSOR) 25 mg tablet Take 1 Tab by mouth two (2) times a day. 10/23/19  Yes Candie Echevaria, NP   metoprolol tartrate (LOPRESSOR) 50 mg tablet Take 1 Tab by mouth two (2) times a day. 10/23/19  Yes Candie Echevaria, NP   hydroCHLOROthiazide (HYDRODIURIL) 12.5 mg tablet Take 1 Tab by mouth daily. 04/22/19  Yes Candie Echevaria, NP   fenofibrate nanocrystallized (TRICOR) 145 mg tablet Take 1 Tab by mouth daily. 04/22/19  Yes Candie Echevaria, NP   apixaban (ELIQUIS) 5 mg tablet Take 1 Tab by mouth two (2) times a day. 04/22/19  Yes Candie Echevaria, NP   cyanocobalamin (VITAMIN B-12) 1,000 mcg tablet Take 1,000 mcg by mouth daily.   Yes Provider, Historical    cinnamon bark (CINNAMON PO) Take  by mouth.   Yes Provider, Historical   meclizine (ANTIVERT) 25 mg tablet Take 1 Tab by mouth every six (6) hours as needed for Dizziness. 07/04/18  Yes Candie Echevaria, NP   verapamil ER (VERELAN) 240 mg ER capsule Take 240 mg by mouth daily. 08/02/17  Yes Provider, Historical   cholecalciferol, vitamin D3, (VITAMIN D3) 2,000 unit tab Take 1 Tab by mouth daily. 09/26/17  Yes Candie Echevaria, NP     Patient Active Problem List   Diagnosis Code   ??? 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 Atrial fibrillation ([place]) I48.91   ??? Pacemaker Z95.0   ??? Hypertension I10   ??? Hypertriglyceridemia E78.1     Patient Active Problem List    Diagnosis Date Noted   ??? Blind in both eyes 06/05/2017   ??? Atrial fibrillation ([place]) 06/05/2017   ??? Pacemaker 06/05/2017   ??? Hypertension 06/05/2017   ??? Hypertriglyceridemia 06/05/2017     Current Outpatient Medications   Medication Sig Dispense Refill   ??? metoprolol tartrate (LOPRESSOR) 25 mg tablet Take 1 Tab by mouth two (2) times a day. 180 Tab 1   ??? metoprolol tartrate (LOPRESSOR) 50 mg tablet Take 1 Tab by mouth two (2) times a day. 180 Tab 1   ??? hydroCHLOROthiazide (HYDRODIURIL) 12.5 mg tablet  Take 1 Tab by mouth daily. 180 Tab 1   ??? fenofibrate nanocrystallized (TRICOR) 145 mg tablet Take 1 Tab by mouth daily. 180 Tab 1   ??? apixaban (ELIQUIS) 5 mg tablet Take 1 Tab by mouth two (2) times a day. 180 Tab 1   ??? cyanocobalamin (VITAMIN B-12) 1,000 mcg tablet Take 1,000 mcg by mouth daily.     ??? cinnamon bark (CINNAMON PO) Take  by mouth.     ??? meclizine (ANTIVERT) 25 mg tablet Take 1 Tab by mouth every six (6) hours as needed for Dizziness. 120 Tab 1   ??? verapamil ER (VERELAN) 240 mg ER capsule Take 240 mg by mouth daily.     ??? cholecalciferol, vitamin D3, (VITAMIN D3) 2,000 unit tab Take 1 Tab by mouth daily. 30 Tab 2       ROS    Constitutional: No apparent distress noted  General- negative for fever, chills or fatigue  Eyes- blind in both eyes   CV- denies chest pain, palpitation  Pul: negative cough or SOB  GI: negative nausea, flank pain, diarrhea, constipation  Urinary:- No dysuria or polyuria  MS- negative myalgia, negative joint pain  Neuro- negative headache, dizziness or weakness  Skin- negative for rashes or lesions.  Psych- denies any anxiety or depression    Objective:     Patient-Reported Vitals 11/27/2019   Patient-Reported Weight 186   Patient-Reported Pulse 89   Patient-Reported Temperature 98.1   Patient-Reported Systolic  126   Patient-Reported Diastolic 83        [INSTRUCTIONS:  "[x] " Indicates a positive item  "[] " Indicates a negative item  -- DELETE ALL ITEMS NOT EXAMINED]    Constitutional: [x]  Appears well-developed and well-nourished [x]  No apparent distress      []  Abnormal -     Mental status: [x]  Alert and awake  [x]  Oriented to person/place/time [x]  Able to follow commands    []  Abnormal -     Eyes:   EOM    [x]   Normal    []  Abnormal -   Sclera  [x]   Normal    []  Abnormal -          Discharge [x]   None visible   []  Abnormal -     HENT: [x]  Normocephalic, atraumatic  []  Abnormal -   [x]  Mouth/Throat: Mucous membranes are moist    External Ears [x]  Normal  []  Abnormal -    Neck: [x]  No visualized mass []  Abnormal -     Pulmonary/Chest: [x]  Respiratory effort normal   [x]  No visualized signs of difficulty breathing or respiratory distress        []  Abnormal -      Musculoskeletal:   [x]  Normal gait with no signs of ataxia         [x]  Normal range of motion of neck        []  Abnormal -     Neurological:        [x]  No Facial Asymmetry (Cranial nerve 7 motor function) (limited exam due to video visit)          [x]  No gaze palsy        []  Abnormal -          Skin:        [x]  No significant exanthematous lesions or discoloration noted on facial skin         []  Abnormal -  Psychiatric:       [x]  Normal Affect []  Abnormal -        [x]  No Hallucinations    Other pertinent observable physical exam findings:-         We discussed the expected course, resolution and complications of the diagnosis(es) in detail.  Medication risks, benefits, costs, interactions, and alternatives were discussed as indicated.  I advised her to contact the office if her condition worsens, changes or fails to improve as anticipated. She expressed understanding with the diagnosis(es) and plan.       Abbigal Creely, who was evaluated through a patient-initiated, synchronous (real-time) audio-video encounter, and/or her healthcare decision maker, is aware that it is a billable service, with coverage as determined by her insurance carrier. She provided verbal consent to proceed: Yes, and patient identification was verified. It was conducted pursuant to the emergency declaration under the [employer] Act and the [employer]gen[employer] Act, 1135 waiver authority and the [employer] and [employer] Supplemental Appropriations Act. A caregiver was present when appropriate. Ability to conduct physical exam was limited. I was at home. The patient was at home.      Paula LibraKaren A Fields-Sykes, NP

## 2019-11-27 NOTE — Progress Notes (Signed)
Emily Henson presents today for   Chief Complaint   Patient presents with   ??? Hypertension       Virtual/telephone visit    Depression Screening:  3 most recent PHQ Screens 11/27/2019   Little interest or pleasure in doing things Not at all   Feeling down, depressed, irritable, or hopeless Not at all   Total Score PHQ 2 0       Learning Assessment:  Learning Assessment 08/21/2017   PRIMARY LEARNER Patient   HIGHEST LEVEL OF EDUCATION - PRIMARY LEARNER  GRADUATED HIGH SCHOOL OR GED   BARRIERS PRIMARY LEARNER VISUAL   CO-LEARNER CAREGIVER No   PRIMARY LANGUAGE ENGLISH   LEARNER PREFERENCE PRIMARY OTHER (COMMENT)   ANSWERED BY patient   RELATIONSHIP SELF       Abuse Screening:  Abuse Screening Questionnaire 08/21/2017   Do you ever feel afraid of your partner? N   Are you in a relationship with someone who physically or mentally threatens you? N   Is it safe for you to go home? Y       Fall Risk  Fall Risk Assessment, last 12 mths 11/27/2019   Able to walk? Yes   Fall in past 12 months? Yes   Fall with injury? Yes   Number of falls in past 12 months 1   Fall Risk Score 2         Health Maintenance reviewed and discussed and ordered per Provider.    Health Maintenance Due   Topic Date Due   ??? DTaP/Tdap/Td series (1 - Tdap) 10/01/1956   ??? Shingrix Vaccine Age 50> (1 of 2) 10/01/1985   ??? GLAUCOMA SCREENING Q2Y  10/01/2000   ??? Bone Densitometry (Dexa) Screening  10/01/2000   ??? Medicare Yearly Exam  04/30/2019   ??? Flu Vaccine (1) 08/19/2019   .      Coordination of Care:  1. Have you been to the ER, urgent care clinic since your last visit? Hospitalized since your last visit? no    2. Have you seen or consulted any other health care providers outside of the [facility] since your last visit? Include any pap smears or colon screening. no

## 2019-11-27 NOTE — Patient Instructions (Signed)
Medicare Wellness Visit, Female     The best way to live healthy is to have a lifestyle where you eat a well-balanced diet, exercise regularly, limit alcohol use, and quit all forms of tobacco/nicotine, if applicable.     Regular preventive services are another way to keep healthy. Preventive services (vaccines, screening tests, monitoring & exams) can help personalize your care plan, which helps you manage your own care. Screening tests can find health problems at the earliest stages, when they are easiest to treat.   [facility] follows the current, evidence-based guidelines published by the [place] [employer] (USPSTF) when recommending preventive services for our patients. Because we follow these guidelines, sometimes recommendations change over time as research supports it. (For example, mammograms used to be recommended annually. Even though Medicare will still pay for an annual mammogram, the newer guidelines recommend a mammogram every two years for women of average risk).  Of course, you and your doctor may decide to screen more often for some diseases, based on your risk and your co-morbidities (chronic disease you are already diagnosed with).     Preventive services for you include:  - Medicare offers their members a free annual wellness visit, which is time for you and your primary care provider to discuss and plan for your preventive service needs. Take advantage of this benefit every year!  -All adults over the age of 67 should receive the recommended pneumonia vaccines. Current USPSTF guidelines recommend a series of two vaccines for the best pneumonia protection.   -All adults should have a flu vaccine yearly and a tetanus vaccine every 10 years.   -All adults age 72 and older should receive the shingles vaccines (series of two vaccines).      -All adults age 38-70 who are overweight should have a diabetes screening test once every three years.    -All adults born between [birth year] and 1963 should be screened once for Hepatitis C.  -Other screening tests and preventive services for persons with diabetes include: an eye exam to screen for diabetic retinopathy, a kidney function test, a foot exam, and stricter control over your cholesterol.   -Cardiovascular screening for adults with routine risk involves an electrocardiogram (ECG) at intervals determined by your doctor.   -Colorectal cancer screenings should be done for adults age 58-75 with no increased risk factors for colorectal cancer.  There are a number of acceptable methods of screening for this type of cancer. Each test has its own benefits and drawbacks. Discuss with your doctor what is most appropriate for you during your annual wellness visit. The different tests include: colonoscopy (considered the best screening method), a fecal occult blood test, a fecal DNA test, and sigmoidoscopy.    -A bone mass density test is recommended when a woman turns 65 to screen for osteoporosis. This test is only recommended one time, as a screening. Some providers will use this same test as a disease monitoring tool if you already have osteoporosis.  -Breast cancer screenings are recommended every other year for women of normal risk, age 31-74.  -Cervical cancer screenings for women over age 3 are only recommended with certain risk factors.     Here is a list of your current Health Maintenance items (your personalized list of preventive services) with a due date:  Health Maintenance Due   Topic Date Due   ??? DTaP/Tdap/Td  (1 - Tdap) 10/01/1956   ??? Shingles Vaccine (1 of 2) 10/01/1985   ???  Glaucoma Screening   10/01/2000   ??? Bone Mineral Density   10/01/2000   ??? Annual Well Visit  04/30/2019   ??? Yearly Flu Vaccine (1) 08/19/2019

## 2019-12-22 ENCOUNTER — Ambulatory Visit: Admit: 2019-12-22 | Discharge: 2019-12-22 | Payer: MEDICARE | Primary: Family

## 2019-12-22 DIAGNOSIS — I10 Essential (primary) hypertension: Secondary | ICD-10-CM

## 2019-12-22 NOTE — Progress Notes (Signed)
Please notify patient that lab results were reviewed and the results are okay.

## 2019-12-23 ENCOUNTER — Inpatient Hospital Stay: Admit: 2019-12-23 | Payer: MEDICARE | Primary: Family

## 2019-12-23 LAB — LIPID PANEL
CHOL/HDL Ratio: 3.3 (ref 0–5.0)
Chol/HDL Ratio: 3.3 (ref 0–5.0)
Cholesterol, Total: 158 MG/DL (ref <200)
Cholesterol, total: 158 MG/DL (ref <200)
HDL Cholesterol: 48 MG/DL (ref 40–60)
HDL: 48 MG/DL (ref 40–60)
LDL Calculated: 80.4 MG/DL (ref 0–100)
LDL, calculated: 80.4 MG/DL (ref 0–100)
Triglyceride: 148 MG/DL (ref <150)
Triglycerides: 148 MG/DL (ref <150)
VLDL Cholesterol Calculated: 29.6 MG/DL
VLDL, calculated: 29.6 MG/DL

## 2019-12-23 LAB — CBC WITH AUTOMATED DIFF
ABS. BASOPHILS: 0 10*3/uL (ref 0.0–0.1)
ABS. EOSINOPHILS: 0 10*3/uL (ref 0.0–0.4)
ABS. LYMPHOCYTES: 2.3 10*3/uL (ref 0.9–3.6)
ABS. MONOCYTES: 0.6 10*3/uL (ref 0.05–1.2)
ABS. NEUTROPHILS: 4.5 10*3/uL (ref 1.8–8.0)
BASOPHILS: 0 % (ref 0–2)
EOSINOPHILS: 1 % (ref 0–5)
HCT: 45.6 % — ABNORMAL HIGH (ref 35.0–45.0)
HGB: 14.8 g/dL (ref 12.0–16.0)
LYMPHOCYTES: 31 % (ref 21–52)
MCH: 31.8 PG (ref 24.0–34.0)
MCHC: 32.5 g/dL (ref 31.0–37.0)
MCV: 98.1 FL — ABNORMAL HIGH (ref 74.0–97.0)
MONOCYTES: 8 % (ref 3–10)
MPV: 11.8 FL (ref 9.2–11.8)
NEUTROPHILS: 60 % (ref 40–73)
PLATELET: 281 10*3/uL (ref 135–420)
RBC: 4.65 M/uL (ref 4.20–5.30)
RDW: 13.8 % (ref 11.6–14.5)
WBC: 7.5 10*3/uL (ref 4.6–13.2)

## 2019-12-23 LAB — METABOLIC PANEL, COMPREHENSIVE
A-G Ratio: 1 (ref 0.8–1.7)
ALT (SGPT): 32 U/L (ref 13–56)
AST (SGOT): 16 U/L (ref 10–38)
Albumin: 3.5 g/dL (ref 3.4–5.0)
Alk. phosphatase: 55 U/L (ref 45–117)
Anion gap: 7 mmol/L (ref 3.0–18)
BUN/Creatinine ratio: 16 (ref 12–20)
BUN: 14 MG/DL (ref 7.0–18)
Bilirubin, total: 0.3 MG/DL (ref 0.2–1.0)
CO2: 26 mmol/L (ref 21–32)
Calcium: 10.5 MG/DL — ABNORMAL HIGH (ref 8.5–10.1)
Chloride: 110 mmol/L (ref 100–111)
Creatinine: 0.85 MG/DL (ref 0.6–1.3)
GFR est AA: >60 mL/min/{1.73_m2} (ref >60)
GFR est non-AA: >60 mL/min/{1.73_m2} (ref >60)
Globulin: 3.5 g/dL (ref 2.0–4.0)
Glucose: 81 mg/dL (ref 74–99)
Potassium: 4 mmol/L (ref 3.5–5.5)
Protein, total: 7 g/dL (ref 6.4–8.2)
Sodium: 143 mmol/L (ref 136–145)

## 2019-12-23 LAB — CBC WITH AUTO DIFFERENTIAL
Basophils %: 0 % (ref 0–2)
Basophils Absolute: 0 10*3/uL (ref 0.0–0.1)
Eosinophils %: 1 % (ref 0–5)
Eosinophils Absolute: 0 10*3/uL (ref 0.0–0.4)
Hematocrit: 45.6 % — ABNORMAL HIGH (ref 35.0–45.0)
Hemoglobin: 14.8 g/dL (ref 12.0–16.0)
Lymphocytes %: 31 % (ref 21–52)
Lymphocytes Absolute: 2.3 10*3/uL (ref 0.9–3.6)
MCH: 31.8 PG (ref 24.0–34.0)
MCHC: 32.5 g/dL (ref 31.0–37.0)
MCV: 98.1 FL — ABNORMAL HIGH (ref 74.0–97.0)
MPV: 11.8 FL (ref 9.2–11.8)
Monocytes %: 8 % (ref 3–10)
Monocytes Absolute: 0.6 10*3/uL (ref 0.05–1.2)
Neutrophils %: 60 % (ref 40–73)
Neutrophils Absolute: 4.5 10*3/uL (ref 1.8–8.0)
Platelets: 281 10*3/uL (ref 135–420)
RBC: 4.65 M/uL (ref 4.20–5.30)
RDW: 13.8 % (ref 11.6–14.5)
WBC: 7.5 10*3/uL (ref 4.6–13.2)

## 2019-12-23 LAB — COMPREHENSIVE METABOLIC PANEL
ALT: 32 U/L (ref 13–56)
AST: 16 U/L (ref 10–38)
Albumin/Globulin Ratio: 1 (ref 0.8–1.7)
Albumin: 3.5 g/dL (ref 3.4–5.0)
Alkaline Phosphatase: 55 U/L (ref 45–117)
Anion Gap: 7 mmol/L (ref 3.0–18)
BUN: 14 MG/DL (ref 7.0–18)
Bun/Cre Ratio: 16 (ref 12–20)
CO2: 26 mmol/L (ref 21–32)
Calcium: 10.5 MG/DL — ABNORMAL HIGH (ref 8.5–10.1)
Chloride: 110 mmol/L (ref 100–111)
Creatinine: 0.85 MG/DL (ref 0.6–1.3)
EGFR IF NonAfrican American: >60 mL/min/{1.73_m2} (ref >60)
GFR African American: >60 mL/min/{1.73_m2} (ref >60)
Globulin: 3.5 g/dL (ref 2.0–4.0)
Glucose: 81 mg/dL (ref 74–99)
Potassium: 4 mmol/L (ref 3.5–5.5)
Sodium: 143 mmol/L (ref 136–145)
Total Bilirubin: 0.3 MG/DL (ref 0.2–1.0)
Total Protein: 7 g/dL (ref 6.4–8.2)

## 2020-02-03 NOTE — Telephone Encounter (Signed)
*  please

## 2020-02-03 NOTE — Telephone Encounter (Signed)
Patient son called stating he think she has an UTI.  Please call him back at [phone]

## 2020-02-03 NOTE — Telephone Encounter (Signed)
These call to see if the patient can come in tomorrow for possible UTI.

## 2020-02-04 ENCOUNTER — Ambulatory Visit: Admit: 2020-02-04 | Discharge: 2020-02-04 | Payer: MEDICARE | Attending: Family | Primary: Family

## 2020-02-04 ENCOUNTER — Ambulatory Visit: Attending: Family | Primary: Family

## 2020-02-04 DIAGNOSIS — R399 Unspecified symptoms and signs involving the genitourinary system: Secondary | ICD-10-CM

## 2020-02-04 DIAGNOSIS — R350 Frequency of micturition: Secondary | ICD-10-CM

## 2020-02-04 LAB — AMB POC URINALYSIS DIP STICK AUTO W/O MICRO
Bilirubin (UA POC): NEGATIVE
Bilirubin, Urine, POC: NEGATIVE
Glucose (UA POC): NEGATIVE
Glucose, Urine, POC: NEGATIVE
Ketones (UA POC): NEGATIVE
Ketones, Urine, POC: NEGATIVE
Nitrite, Urine, POC: NEGATIVE
Nitrites (UA POC): NEGATIVE
Specific Gravity, Urine, POC: 1.025 NA (ref 1.001–1.035)
Specific gravity (UA POC): 1.025 (ref 1.001–1.035)
Urobilinogen (UA POC): 0.2 (ref 0.2–1)
Urobilinogen, POC: 0.2 (ref 0.2–1)
pH (UA POC): 5 (ref 4.6–8.0)
pH, Urine, POC: 5 NA (ref 4.6–8.0)

## 2020-02-04 MED ORDER — NITROFURANTOIN (25% MACROCRYSTAL FORM) 100 MG CAP
100 mg | ORAL_CAPSULE | Freq: Two times a day (BID) | ORAL | 0 refills | Status: DC
Start: 2020-02-04 — End: 2020-02-04

## 2020-02-04 MED ORDER — NITROFURANTOIN (25% MACROCRYSTAL FORM) 100 MG CAP
100 mg | ORAL_CAPSULE | Freq: Two times a day (BID) | ORAL | 0 refills | Status: DC
Start: 2020-02-04 — End: 2020-08-10

## 2020-02-04 NOTE — Progress Notes (Signed)
Emily Henson is a 86 y.o. female presenting today for Urinary Frequency    HPI:  Emily Henson presents to the office today for complaints of urgency and frequency.  Her symptoms have been ongoing x2 days.  She denied any dysuria or fever.  Patient presents today with elevated BP.  Her BP readings 146/76.  She is compliant with taking her medication daily and is negative for any chest pain or palpitation.  She is prescribed metoprolol, hydrochlorothiazide as well as verapamil daily.  Cardiac risk factors include obesity.    ROS    ROS:  History obtained from the patient intake forms which are reviewed with the patient  ?? General: negative for - chills, fever, weight changes or malaise  ?? HEENT: patient is blind. no sore throat, nasal congestion or ear problems  ?? Respiratory: no cough, shortness of breath, or wheezing  ?? Cardiovascular: no chest pain, palpitations, or dyspnea on exertion  ?? Gastrointestinal: no abdominal pain, N/V, change in bowel habits, or black or bloody stools  ?? Musculoskeletal: no back pain, joint pain, joint stiffness, muscle pain or muscle weakness  ?? Neurological: no numbness, tingling, headache or dizziness  ?? Endo:  No polyuria or polydipsia.   ?? GU: c/o frequency and urgency  ?? Psychological: negative for - anxiety, depression, sleep disturbances, suicidal or homicidal ideations    Allergies   Allergen Reactions   ??? Caffeine Palpitations       Current Outpatient Medications   Medication Sig Dispense Refill   ??? nitrofurantoin, macrocrystal-monohydrate, (Macrobid) 100 mg capsule Take 1 Cap by mouth two (2) times a day. 14 Cap 0   ??? metoprolol tartrate (LOPRESSOR) 25 mg tablet Take 1 Tab by mouth two (2) times a day. 180 Tab 1   ??? metoprolol tartrate (LOPRESSOR) 50 mg tablet Take 1 Tab by mouth two (2) times a day. 180 Tab 1   ??? hydroCHLOROthiazide (HYDRODIURIL) 12.5 mg tablet Take 1 Tab by mouth daily. 180 Tab 1   ??? fenofibrate nanocrystallized (TRICOR) 145 mg tablet Take 1 Tab by mouth  daily. 180 Tab 1   ??? apixaban (ELIQUIS) 5 mg tablet Take 1 Tab by mouth two (2) times a day. 180 Tab 1   ??? cyanocobalamin (VITAMIN B-12) 1,000 mcg tablet Take 1,000 mcg by mouth daily.     ??? cinnamon bark (CINNAMON PO) Take  by mouth.     ??? meclizine (ANTIVERT) 25 mg tablet Take 1 Tab by mouth every six (6) hours as needed for Dizziness. 120 Tab 1   ??? verapamil ER (VERELAN) 240 mg ER capsule Take 240 mg by mouth daily.     ??? cholecalciferol, vitamin D3, (VITAMIN D3) 2,000 unit tab Take 1 Tab by mouth daily. 30 Tab 2       Past Medical History:   Diagnosis Date   ??? Arrhythmia     Atrial Fibrillation   ??? Arthritis    ??? CAD (coronary artery disease)    ??? Hypercholesterolemia     hypertriglycerides   ??? Hypertension        Past Surgical History:   Procedure Laterality Date   ??? HX APPENDECTOMY     ??? HX BREAST REDUCTION  1990   ??? HX CHOLECYSTECTOMY     ??? HX COLONOSCOPY     ??? HX GYN      hysterectomy   ??? HX PACEMAKER     ??? HX TOTAL COLECTOMY     ??? HX TOTAL COLECTOMY  Social History     Socioeconomic History   ??? Marital status: MARRIED     Spouse name: Not on file   ??? Number of children: Not on file   ??? Years of education: Not on file   ??? Highest education level: Not on file   Occupational History   ??? Not on file   Social Needs   ??? Financial resource strain: Not on file   ??? Food insecurity     Worry: Not on file     Inability: Not on file   ??? Transportation needs     Medical: Not on file     Non-medical: Not on file   Tobacco Use   ??? Smoking status: Former Smoker   ??? Smokeless tobacco: Never Used   Substance and Sexual Activity   ??? Alcohol use: No   ??? Drug use: No   ??? Sexual activity: Never     Partners: Male   Lifestyle   ??? Physical activity     Days per week: Not on file     Minutes per session: Not on file   ??? Stress: Not on file   Relationships   ??? Social Product manager on phone: Not on file     Gets together: Not on file     Attends religious service: Not on file     Active member of club or organization:  Not on file     Attends meetings of clubs or organizations: Not on file     Relationship status: Not on file   ??? Intimate partner violence     Fear of current or ex partner: Not on file     Emotionally abused: Not on file     Physically abused: Not on file     Forced sexual activity: Not on file   Other Topics Concern   ??? Not on file   Social History Narrative   ??? Not on file           Vitals:    02/04/20 1324   BP: (!) 146/76   Pulse: 87   Resp: 16   Temp: 97 ??F (36.1 ??C)   TempSrc: Oral   SpO2: 99%   Weight: 189 lb (85.7 kg)   Height: '5\' 5"'$  (1.651 m)   PainSc:   0 - No pain       Physical Exam  Constitutional:       Appearance: Normal appearance.   Eyes:      Comments: Patient is blind   Cardiovascular:      Rate and Rhythm: Normal rate and regular rhythm.      Pulses: Normal pulses.      Heart sounds: Normal heart sounds.   Abdominal:      General: Abdomen is flat.      Palpations: Abdomen is soft.      Tenderness: There is no abdominal tenderness.   Skin:     General: Skin is warm and dry.   Neurological:      Mental Status: She is alert.         Office Visit on 02/04/2020   Component Date Value Ref Range Status   ??? Color (UA POC) 02/04/2020 Yellow   Final   ??? Clarity (UA POC) 02/04/2020 Cloudy   Final   ??? Glucose (UA POC) 02/04/2020 Negative  Negative Final   ??? Bilirubin (UA POC) 02/04/2020 Negative  Negative Final   ??? Ketones (UA POC)  02/04/2020 Negative  Negative Final   ??? Specific gravity (UA POC) 02/04/2020 1.025  1.001 - 1.035 Final   ??? Blood (UA POC) 02/04/2020 2+  Negative Final   ??? pH (UA POC) 02/04/2020 5.0  4.6 - 8.0 Final   ??? Protein (UA POC) 02/04/2020 1+  Negative Final   ??? Urobilinogen (UA POC) 02/04/2020 0.2 mg/dL  0.2 - 1 Final   ??? Nitrites (UA POC) 02/04/2020 Negative  Negative Final   ??? Leukocyte esterase (UA POC) 02/04/2020 3+  Negative Final   Hospital Outpatient Visit on 12/22/2019   Component Date Value Ref Range Status   ??? WBC 12/22/2019 7.5  4.6 - 13.2 K/uL Final   ??? RBC 12/22/2019 4.65   4.20 - 5.30 M/uL Final   ??? HGB 12/22/2019 14.8  12.0 - 16.0 g/dL Final   ??? HCT 12/22/2019 45.6* 35.0 - 45.0 % Final   ??? MCV 12/22/2019 98.1* 74.0 - 97.0 FL Final   ??? MCH 12/22/2019 31.8  24.0 - 34.0 PG Final   ??? MCHC 12/22/2019 32.5  31.0 - 37.0 g/dL Final   ??? RDW 12/22/2019 13.8  11.6 - 14.5 % Final   ??? PLATELET 12/22/2019 281  135 - 420 K/uL Final   ??? MPV 12/22/2019 11.8  9.2 - 11.8 FL Final   ??? NEUTROPHILS 12/22/2019 60  40 - 73 % Final   ??? LYMPHOCYTES 12/22/2019 31  21 - 52 % Final   ??? MONOCYTES 12/22/2019 8  3 - 10 % Final   ??? EOSINOPHILS 12/22/2019 1  0 - 5 % Final   ??? BASOPHILS 12/22/2019 0  0 - 2 % Final   ??? ABS. NEUTROPHILS 12/22/2019 4.5  1.8 - 8.0 K/UL Final   ??? ABS. LYMPHOCYTES 12/22/2019 2.3  0.9 - 3.6 K/UL Final   ??? ABS. MONOCYTES 12/22/2019 0.6  0.05 - 1.2 K/UL Final   ??? ABS. EOSINOPHILS 12/22/2019 0.0  0.0 - 0.4 K/UL Final   ??? ABS. BASOPHILS 12/22/2019 0.0  0.0 - 0.1 K/UL Final   ??? DF 12/22/2019 AUTOMATED    Final   ??? LIPID PROFILE 12/22/2019        Final   ??? Cholesterol, total 12/22/2019 158  <200 MG/DL Final   ??? Triglyceride 12/22/2019 148  <150 MG/DL Final    Comment: The drugs N-acetylcysteine (NAC) and  Metamiszole have been found to cause falsely  low results in this chemical assay. Please  be sure to submit blood samples obtained  BEFORE administration of either of these  drugs to assure correct results.     ??? HDL Cholesterol 12/22/2019 48  40 - 60 MG/DL Final   ??? LDL, calculated 12/22/2019 80.4  0 - 100 MG/DL Final   ??? VLDL, calculated 12/22/2019 29.6  MG/DL Final   ??? CHOL/HDL Ratio 12/22/2019 3.3  0 - 5.0   Final   ??? Sodium 12/22/2019 143  136 - 145 mmol/L Final   ??? Potassium 12/22/2019 4.0  3.5 - 5.5 mmol/L Final   ??? Chloride 12/22/2019 110  100 - 111 mmol/L Final   ??? CO2 12/22/2019 26  21 - 32 mmol/L Final   ??? Anion gap 12/22/2019 7  3.0 - 18 mmol/L Final   ??? Glucose 12/22/2019 81  74 - 99 mg/dL Final   ??? BUN 12/22/2019 14  7.0 - 18 MG/DL Final   ??? Creatinine 12/22/2019 0.85  0.6 - 1.3  MG/DL Final   ??? BUN/Creatinine ratio 12/22/2019 16  12 -  20   Final   ??? GFR est AA 12/22/2019 >60  >60 ml/min/1.56m Final   ??? GFR est non-AA 12/22/2019 >60  >60 ml/min/1.758mFinal    Comment: (NOTE)  Estimated GFR is calculated using the Modification of Diet in Renal   Disease (MDRD) Study equation, reported for both African Americans   (GFRAA) and non-African Americans (GFRNA), and normalized to 1.7383m body surface area. The physician must decide which value applies to   the patient. The MDRD study equation should only be used in   individuals age 47 65 older. It has not been validated for the   following: pregnant women, patients with serious comorbid conditions,   or on certain medications, or persons with extremes of body size,   muscle mass, or nutritional status.     ??? Calcium 12/22/2019 10.5* 8.5 - 10.1 MG/DL Final   ??? Bilirubin, total 12/22/2019 0.3  0.2 - 1.0 MG/DL Final   ??? ALT (SGPT) 12/22/2019 32  13 - 56 U/L Final   ??? AST (SGOT) 12/22/2019 16  10 - 38 U/L Final   ??? Alk. phosphatase 12/22/2019 55  45 - 117 U/L Final   ??? Protein, total 12/22/2019 7.0  6.4 - 8.2 g/dL Final   ??? Albumin 12/22/2019 3.5  3.4 - 5.0 g/dL Final   ??? Globulin 12/22/2019 3.5  2.0 - 4.0 g/dL Final   ??? A-G Ratio 12/22/2019 1.0  0.8 - 1.7   Final       .  Results for orders placed or performed in visit on 02/04/20   AMB POC URINALYSIS DIP STICK AUTO W/O MICRO   Result Value Ref Range    Color (UA POC) Yellow     Clarity (UA POC) Cloudy     Glucose (UA POC) Negative Negative    Bilirubin (UA POC) Negative Negative    Ketones (UA POC) Negative Negative    Specific gravity (UA POC) 1.025 1.001 - 1.035    Blood (UA POC) 2+ Negative    pH (UA POC) 5.0 4.6 - 8.0    Protein (UA POC) 1+ Negative    Urobilinogen (UA POC) 0.2 mg/dL 0.2 - 1    Nitrites (UA POC) Negative Negative    Leukocyte esterase (UA POC) 3+ Negative       Assessment / Plan:      ICD-10-CM ICD-9-CM    1. UTI symptoms  R39.9 788.99 CULTURE, URINE      nitrofurantoin,  macrocrystal-monohydrate, (Macrobid) 100 mg capsule      DISCONTINUED: nitrofurantoin, macrocrystal-monohydrate, (Macrobid) 100 mg capsule   2. Urine frequency  R35.0 788.41 AMB POC URINALYSIS DIP STICK AUTO W/O MICRO      nitrofurantoin, macrocrystal-monohydrate, (Macrobid) 100 mg capsule      DISCONTINUED: nitrofurantoin, macrocrystal-monohydrate, (Macrobid) 100 mg capsule   3. Encounter for immunization  Z23 V03.89 COVID-19, MRNA, LNP-S, PF, 30MCG/0.3ML DOSE(PFIZER)      PR IMMUNIZ ADMIN,1 SINGLE/COMB VAC/TOXOID   4. Essential hypertension  I10 401.9      Hypertension-BP is mildly elevated today.  Patient took her medication as ordered.  We will continue to monitor no change to treatment plan at this time.  UTI-Macrobid prescription given  Patient requesting Covid vaccine  Follow-up and Dispositions    ?? Return in about 4 months (around 06/03/2020).         I asked the patient if she  had any questions and answered her  questions.  The patient stated that she understands  the treatment plan and agrees with the treatment plan    This document was created with a voice activated dictation system and may contain transcription errors.

## 2020-02-04 NOTE — Progress Notes (Signed)
 Emily Henson presents today for   Chief Complaint   Patient presents with   . Urinary Frequency       Is someone accompanying this pt? son    Is the patient using any DME equipment during OV? cane    Depression Screening:  3 most recent PHQ Screens 02/04/2020   Little interest or pleasure in doing things Not at all   Feeling down, depressed, irritable, or hopeless Not at all   Total Score PHQ 2 0       Learning Assessment:  Learning Assessment 08/21/2017   PRIMARY LEARNER Patient   HIGHEST LEVEL OF EDUCATION - PRIMARY LEARNER  GRADUATED HIGH SCHOOL OR GED   BARRIERS PRIMARY LEARNER VISUAL   CO-LEARNER CAREGIVER No   PRIMARY LANGUAGE ENGLISH   LEARNER PREFERENCE PRIMARY OTHER (COMMENT)   ANSWERED BY patient   RELATIONSHIP SELF       Abuse Screening:  Abuse Screening Questionnaire 08/21/2017   Do you ever feel afraid of your partner? N   Are you in a relationship with someone who physically or mentally threatens you? N   Is it safe for you to go home? Y       Fall Risk  Fall Risk Assessment, last 12 mths 02/04/2020   Able to walk? Yes   Fall in past 12 months? 1   Do you feel unsteady? 0   Are you worried about falling 0   Is TUG test greater than 12 seconds? 0   Is the gait abnormal? 0   Number of falls in past 12 months 1   Fall with injury? 0       Health Maintenance reviewed and discussed and ordered per Provider.    Health Maintenance Due   Topic Date Due   . COVID-19 Vaccine (1 of 2) 10/02/1949   . DTaP/Tdap/Td series (1 - Tdap) 10/01/1956   . Shingrix Vaccine Age 71> (1 of 2) 10/01/1985   . GLAUCOMA SCREENING Q2Y  10/01/2000   . Bone Densitometry (Dexa) Screening  10/01/2000   . Flu Vaccine (1) 08/19/2019   .      Coordination of Care:  1. Have you been to the ER, urgent care clinic since your last visit? Hospitalized since your last visit? no    2. Have you seen or consulted any other health care providers outside of the [facility] since your last visit? Include any pap smears or colon screening.  no

## 2020-02-04 NOTE — Progress Notes (Signed)
Emily Henson presents today for   Chief Complaint   Patient presents with   ??? Urinary Frequency       Is someone accompanying this pt? son    Is the patient using any DME equipment during OV? cane    Depression Screening:  3 most recent PHQ Screens 02/04/2020   Little interest or pleasure in doing things Not at all   Feeling down, depressed, irritable, or hopeless Not at all   Total Score PHQ 2 0       Learning Assessment:  Learning Assessment 08/21/2017   PRIMARY LEARNER Patient   HIGHEST LEVEL OF EDUCATION - PRIMARY LEARNER  GRADUATED HIGH SCHOOL OR GED   BARRIERS PRIMARY LEARNER VISUAL   CO-LEARNER CAREGIVER No   PRIMARY LANGUAGE ENGLISH   LEARNER PREFERENCE PRIMARY OTHER (COMMENT)   ANSWERED BY patient   RELATIONSHIP SELF       Abuse Screening:  Abuse Screening Questionnaire 08/21/2017   Do you ever feel afraid of your partner? N   Are you in a relationship with someone who physically or mentally threatens you? N   Is it safe for you to go home? Y       Fall Risk  Fall Risk Assessment, last 12 mths 02/04/2020   Able to walk? Yes   Fall in past 12 months? 1   Do you feel unsteady? 0   Are you worried about falling 0   Is TUG test greater than 12 seconds? 0   Is the gait abnormal? 0   Number of falls in past 12 months 1   Fall with injury? 0       Health Maintenance reviewed and discussed and ordered per Provider.    Health Maintenance Due   Topic Date Due   ??? COVID-19 Vaccine (1 of 2) 10/02/1949   ??? DTaP/Tdap/Td series (1 - Tdap) 10/01/1956   ??? Shingrix Vaccine Age 68> (1 of 2) 10/01/1985   ??? GLAUCOMA SCREENING Q2Y  10/01/2000   ??? Bone Densitometry (Dexa) Screening  10/01/2000   ??? Flu Vaccine (1) 08/19/2019   .      Coordination of Care:  1. Have you been to the ER, urgent care clinic since your last visit? Hospitalized since your last visit? no     2. Have you seen or consulted any other health care providers outside of the [facility] since your last visit? Include any pap smears or colon screening. no

## 2020-02-04 NOTE — Telephone Encounter (Signed)
TC to patient she will in the office at 1:15 pm today 02/04/2020.

## 2020-02-04 NOTE — Progress Notes (Signed)
Emily Henson is a 86 y.o. female presenting today for Urinary Frequency    HPI:  [name] presents to the office today for complaints of urgency and frequency.  Her symptoms have been ongoing x2 days.  She denied any dysuria or fever.  Patient presents today with elevated BP.  Her BP readings 146/76.  She is compliant with taking her medication daily and is negative for any chest pain or palpitation.  She is prescribed metoprolol, hydrochlorothiazide as well as verapamil daily.  Cardiac risk factors include obesity.    ROS    ROS:  History obtained from the patient intake forms which are reviewed with the patient  ?? General: negative for - chills, fever, weight changes or malaise  ?? HEENT: patient is blind. no sore throat, nasal congestion or ear problems  ?? Respiratory: no cough, shortness of breath, or wheezing  ?? Cardiovascular: no chest pain, palpitations, or dyspnea on exertion  ?? Gastrointestinal: no abdominal pain, N/V, change in bowel habits, or black or bloody stools  ?? Musculoskeletal: no back pain, joint pain, joint stiffness, muscle pain or muscle weakness  ?? Neurological: no numbness, tingling, headache or dizziness  ?? Endo:  No polyuria or polydipsia.   ?? GU: c/o frequency and urgency  ?? Psychological: negative for - anxiety, depression, sleep disturbances, suicidal or homicidal ideations    Allergies   Allergen Reactions   ??? Caffeine Palpitations       Current Outpatient Medications   Medication Sig Dispense Refill   ??? nitrofurantoin, macrocrystal-monohydrate, (Macrobid) 100 mg capsule Take 1 Cap by mouth two (2) times a day. 14 Cap 0   ??? metoprolol tartrate (LOPRESSOR) 25 mg tablet Take 1 Tab by mouth two (2) times a day. 180 Tab 1   ??? metoprolol tartrate (LOPRESSOR) 50 mg tablet Take 1 Tab by mouth two (2) times a day. 180 Tab 1   ??? hydroCHLOROthiazide (HYDRODIURIL) 12.5 mg tablet Take 1 Tab by mouth daily. 180 Tab 1    ??? fenofibrate nanocrystallized (TRICOR) 145 mg tablet Take 1 Tab by mouth daily. 180 Tab 1   ??? apixaban (ELIQUIS) 5 mg tablet Take 1 Tab by mouth two (2) times a day. 180 Tab 1   ??? cyanocobalamin (VITAMIN B-12) 1,000 mcg tablet Take 1,000 mcg by mouth daily.     ??? cinnamon bark (CINNAMON PO) Take  by mouth.     ??? meclizine (ANTIVERT) 25 mg tablet Take 1 Tab by mouth every six (6) hours as needed for Dizziness. 120 Tab 1   ??? verapamil ER (VERELAN) 240 mg ER capsule Take 240 mg by mouth daily.     ??? cholecalciferol, vitamin D3, (VITAMIN D3) 2,000 unit tab Take 1 Tab by mouth daily. 30 Tab 2       Past Medical History:   Diagnosis Date   ??? Arrhythmia     Atrial Fibrillation   ??? Arthritis    ??? CAD (coronary artery disease)    ??? Hypercholesterolemia     hypertriglycerides   ??? Hypertension        Past Surgical History:   Procedure Laterality Date   ??? HX APPENDECTOMY     ??? HX BREAST REDUCTION  1990   ??? HX CHOLECYSTECTOMY     ??? HX COLONOSCOPY     ??? HX GYN      hysterectomy   ??? HX PACEMAKER     ??? HX TOTAL COLECTOMY     ??? HX TOTAL COLECTOMY  Social History     Socioeconomic History   ??? Marital status: MARRIED     Spouse name: Not on file   ??? Number of children: Not on file   ??? Years of education: Not on file   ??? Highest education level: Not on file   Occupational History   ??? Not on file   Social Needs   ??? Financial resource strain: Not on file   ??? Food insecurity     Worry: Not on file     Inability: Not on file   ??? Transportation needs     Medical: Not on file     Non-medical: Not on file   Tobacco Use   ??? Smoking status: Former Smoker   ??? Smokeless tobacco: Never Used   Substance and Sexual Activity   ??? Alcohol use: No   ??? Drug use: No   ??? Sexual activity: Never     Partners: Male   Lifestyle   ??? Physical activity     Days per week: Not on file     Minutes per session: Not on file   ??? Stress: Not on file   Relationships   ??? Social Product manager on phone: Not on file     Gets together: Not on file      Attends religious service: Not on file     Active member of club or organization: Not on file     Attends meetings of clubs or organizations: Not on file     Relationship status: Not on file   ??? Intimate partner violence     Fear of current or ex partner: Not on file     Emotionally abused: Not on file     Physically abused: Not on file     Forced sexual activity: Not on file   Other Topics Concern   ??? Not on file   Social History Narrative   ??? Not on file           Vitals:    02/04/20 1324   BP: (!) 146/76   Pulse: 87   Resp: 16   Temp: 97 ??F (36.1 ??C)   TempSrc: Oral   SpO2: 99%   Weight: 189 lb (85.7 kg)   Height: '5\' 5"'$  (1.651 m)   PainSc:   0 - No pain       Physical Exam  Constitutional:       Appearance: Normal appearance.   Eyes:      Comments: Patient is blind   Cardiovascular:      Rate and Rhythm: Normal rate and regular rhythm.      Pulses: Normal pulses.      Heart sounds: Normal heart sounds.   Abdominal:      General: Abdomen is flat.      Palpations: Abdomen is soft.      Tenderness: There is no abdominal tenderness.   Skin:     General: Skin is warm and dry.   Neurological:      Mental Status: She is alert.         Office Visit on 02/04/2020   Component Date Value Ref Range Status   ??? Color (UA POC) 02/04/2020 Yellow   Final   ??? Clarity (UA POC) 02/04/2020 Cloudy   Final   ??? Glucose (UA POC) 02/04/2020 Negative  Negative Final   ??? Bilirubin (UA POC) 02/04/2020 Negative  Negative Final   ??? Ketones (UA POC)  02/04/2020 Negative  Negative Final   ??? Specific gravity (UA POC) 02/04/2020 1.025  1.001 - 1.035 Final   ??? Blood (UA POC) 02/04/2020 2+  Negative Final   ??? pH (UA POC) 02/04/2020 5.0  4.6 - 8.0 Final   ??? Protein (UA POC) 02/04/2020 1+  Negative Final   ??? Urobilinogen (UA POC) 02/04/2020 0.2 mg/dL  0.2 - 1 Final   ??? Nitrites (UA POC) 02/04/2020 Negative  Negative Final   ??? Leukocyte esterase (UA POC) 02/04/2020 3+  Negative Final   Hospital Outpatient Visit on 12/22/2019    Component Date Value Ref Range Status   ??? WBC 12/22/2019 7.5  4.6 - 13.2 K/uL Final   ??? RBC 12/22/2019 4.65  4.20 - 5.30 M/uL Final   ??? HGB 12/22/2019 14.8  12.0 - 16.0 g/dL Final   ??? HCT 12/22/2019 45.6* 35.0 - 45.0 % Final   ??? MCV 12/22/2019 98.1* 74.0 - 97.0 FL Final   ??? MCH 12/22/2019 31.8  24.0 - 34.0 PG Final   ??? MCHC 12/22/2019 32.5  31.0 - 37.0 g/dL Final   ??? RDW 12/22/2019 13.8  11.6 - 14.5 % Final   ??? PLATELET 12/22/2019 281  135 - 420 K/uL Final   ??? MPV 12/22/2019 11.8  9.2 - 11.8 FL Final   ??? NEUTROPHILS 12/22/2019 60  40 - 73 % Final   ??? LYMPHOCYTES 12/22/2019 31  21 - 52 % Final   ??? MONOCYTES 12/22/2019 8  3 - 10 % Final   ??? EOSINOPHILS 12/22/2019 1  0 - 5 % Final   ??? BASOPHILS 12/22/2019 0  0 - 2 % Final   ??? ABS. NEUTROPHILS 12/22/2019 4.5  1.8 - 8.0 K/UL Final   ??? ABS. LYMPHOCYTES 12/22/2019 2.3  0.9 - 3.6 K/UL Final   ??? ABS. MONOCYTES 12/22/2019 0.6  0.05 - 1.2 K/UL Final   ??? ABS. EOSINOPHILS 12/22/2019 0.0  0.0 - 0.4 K/UL Final   ??? ABS. BASOPHILS 12/22/2019 0.0  0.0 - 0.1 K/UL Final   ??? DF 12/22/2019 AUTOMATED    Final   ??? LIPID PROFILE 12/22/2019        Final   ??? Cholesterol, total 12/22/2019 158  <200 MG/DL Final   ??? Triglyceride 12/22/2019 148  <150 MG/DL Final    Comment: The drugs N-acetylcysteine (NAC) and  Metamiszole have been found to cause falsely  low results in this chemical assay. Please  be sure to submit blood samples obtained  BEFORE administration of either of these  drugs to assure correct results.     ??? HDL Cholesterol 12/22/2019 48  40 - 60 MG/DL Final   ??? LDL, calculated 12/22/2019 80.4  0 - 100 MG/DL Final   ??? VLDL, calculated 12/22/2019 29.6  MG/DL Final   ??? CHOL/HDL Ratio 12/22/2019 3.3  0 - 5.0   Final   ??? Sodium 12/22/2019 143  136 - 145 mmol/L Final   ??? Potassium 12/22/2019 4.0  3.5 - 5.5 mmol/L Final   ??? Chloride 12/22/2019 110  100 - 111 mmol/L Final   ??? CO2 12/22/2019 26  21 - 32 mmol/L Final   ??? Anion gap 12/22/2019 7  3.0 - 18 mmol/L Final    ??? Glucose 12/22/2019 81  74 - 99 mg/dL Final   ??? BUN 12/22/2019 14  7.0 - 18 MG/DL Final   ??? Creatinine 12/22/2019 0.85  0.6 - 1.3 MG/DL Final   ??? BUN/Creatinine ratio 12/22/2019 16  12 -  20   Final   ??? GFR est AA 12/22/2019 >60  >60 ml/min/1.72m Final   ??? GFR est non-AA 12/22/2019 >60  >60 ml/min/1.785mFinal    Comment: (NOTE)  Estimated GFR is calculated using the Modification of Diet in Renal   Disease (MDRD) Study equation, reported for both African Americans   (GFRAA) and non-African Americans (GFRNA), and normalized to 1.7351m body surface area. The physician must decide which value applies to   the patient. The MDRD study equation should only be used in   individuals age 33 66 older. It has not been validated for the   following: pregnant women, patients with serious comorbid conditions,   or on certain medications, or persons with extremes of body size,   muscle mass, or nutritional status.     ??? Calcium 12/22/2019 10.5* 8.5 - 10.1 MG/DL Final   ??? Bilirubin, total 12/22/2019 0.3  0.2 - 1.0 MG/DL Final   ??? ALT (SGPT) 12/22/2019 32  13 - 56 U/L Final   ??? AST (SGOT) 12/22/2019 16  10 - 38 U/L Final   ??? Alk. phosphatase 12/22/2019 55  45 - 117 U/L Final   ??? Protein, total 12/22/2019 7.0  6.4 - 8.2 g/dL Final   ??? Albumin 12/22/2019 3.5  3.4 - 5.0 g/dL Final   ??? Globulin 12/22/2019 3.5  2.0 - 4.0 g/dL Final   ??? A-G Ratio 12/22/2019 1.0  0.8 - 1.7   Final       .  Results for orders placed or performed in visit on 02/04/20   AMB POC URINALYSIS DIP STICK AUTO W/O MICRO   Result Value Ref Range    Color (UA POC) Yellow     Clarity (UA POC) Cloudy     Glucose (UA POC) Negative Negative    Bilirubin (UA POC) Negative Negative    Ketones (UA POC) Negative Negative    Specific gravity (UA POC) 1.025 1.001 - 1.035    Blood (UA POC) 2+ Negative    pH (UA POC) 5.0 4.6 - 8.0    Protein (UA POC) 1+ Negative    Urobilinogen (UA POC) 0.2 mg/dL 0.2 - 1    Nitrites (UA POC) Negative Negative     Leukocyte esterase (UA POC) 3+ Negative       Assessment / Plan:      ICD-10-CM ICD-9-CM    1. UTI symptoms  R39.9 788.99 CULTURE, URINE      nitrofurantoin, macrocrystal-monohydrate, (Macrobid) 100 mg capsule      DISCONTINUED: nitrofurantoin, macrocrystal-monohydrate, (Macrobid) 100 mg capsule   2. Urine frequency  R35.0 788.41 AMB POC URINALYSIS DIP STICK AUTO W/O MICRO      nitrofurantoin, macrocrystal-monohydrate, (Macrobid) 100 mg capsule      DISCONTINUED: nitrofurantoin, macrocrystal-monohydrate, (Macrobid) 100 mg capsule   3. Encounter for immunization  Z23 V03.89 COVID-19, MRNA, LNP-S, PF, 30MCG/0.3ML DOSE(PFIZER)      PR IMMUNIZ ADMIN,1 SINGLE/COMB VAC/TOXOID   4. Essential hypertension  I10 401.9      Hypertension-BP is mildly elevated today.  Patient took her medication as ordered.  We will continue to monitor no change to treatment plan at this time.  UTI-Macrobid prescription given  Patient requesting Covid vaccine  Follow-up and Dispositions    ?? Return in about 4 months (around 06/03/2020).         I asked the patient if she  had any questions and answered her  questions.  The patient stated that she understands  the treatment plan and agrees with the treatment plan    This document was created with a voice activated dictation system and may contain transcription errors.

## 2020-02-25 NOTE — Telephone Encounter (Signed)
TC to patient son Alinda Money left VM that we no longer offer the Covid vaccine. His mother needs to contact [facility] or register with the Health Department to receive vaccination.

## 2020-04-04 ENCOUNTER — Inpatient Hospital Stay: Admit: 2020-04-04 | Discharge: 2020-04-05 | Payer: MEDICARE | Attending: Emergency Medicine

## 2020-04-21 ENCOUNTER — Encounter

## 2020-04-21 NOTE — Telephone Encounter (Signed)
Requested Prescriptions     Pending Prescriptions Disp Refills   ??? fenofibrate nanocrystallized (TRICOR) 145 mg tablet 180 Tab 1     Sig: Take 1 Tab by mouth daily.   ??? metoprolol tartrate (LOPRESSOR) 25 mg tablet 180 Tab 1     Sig: Take 1 Tab by mouth two (2) times a day.   ??? metoprolol tartrate (LOPRESSOR) 50 mg tablet 180 Tab 1     Sig: Take 1 Tab by mouth two (2) times a day.

## 2020-04-27 MED ORDER — METOPROLOL TARTRATE 50 MG TAB
50 mg | ORAL_TABLET | Freq: Two times a day (BID) | ORAL | 1 refills | Status: DC
Start: 2020-04-27 — End: 2020-05-04

## 2020-04-27 MED ORDER — FENOFIBRATE NANOCRYSTALLIZED 145 MG TAB
145 mg | ORAL_TABLET | Freq: Every day | ORAL | 1 refills | Status: DC
Start: 2020-04-27 — End: 2020-05-04

## 2020-04-27 MED ORDER — METOPROLOL TARTRATE 25 MG TAB
25 mg | ORAL_TABLET | Freq: Two times a day (BID) | ORAL | 1 refills | Status: DC
Start: 2020-04-27 — End: 2020-05-04

## 2020-05-04 ENCOUNTER — Encounter

## 2020-05-04 NOTE — Telephone Encounter (Signed)
Requested Prescriptions     Pending Prescriptions Disp Refills   ??? metoprolol tartrate (LOPRESSOR) 25 mg tablet 180 Tab 1     Sig: Take 1 Tab by mouth two (2) times a day.   ??? metoprolol tartrate (LOPRESSOR) 50 mg tablet 180 Tab 1     Sig: Take 1 Tab by mouth two (2) times a day.   ??? hydroCHLOROthiazide (HYDRODIURIL) 12.5 mg tablet 180 Tab 1     Sig: Take 1 Tab by mouth daily.   ??? fenofibrate nanocrystallized (TRICOR) 145 mg tablet 180 Tab 1     Sig: Take 1 Tab by mouth daily.

## 2020-05-10 MED ORDER — FENOFIBRATE NANOCRYSTALLIZED 145 MG TAB
145 mg | ORAL_TABLET | Freq: Every day | ORAL | 1 refills | Status: DC
Start: 2020-05-10 — End: 2020-11-04

## 2020-05-10 MED ORDER — METOPROLOL TARTRATE 25 MG TAB
25 mg | ORAL_TABLET | Freq: Two times a day (BID) | ORAL | 1 refills | Status: DC
Start: 2020-05-10 — End: 2020-11-04

## 2020-05-10 MED ORDER — METOPROLOL TARTRATE 50 MG TAB
50 mg | ORAL_TABLET | Freq: Two times a day (BID) | ORAL | 1 refills | Status: DC
Start: 2020-05-10 — End: 2020-11-04

## 2020-05-10 MED ORDER — HYDROCHLOROTHIAZIDE 12.5 MG TAB
12.5 mg | ORAL_TABLET | Freq: Every day | ORAL | 1 refills | Status: DC
Start: 2020-05-10 — End: 2020-11-04

## 2020-05-13 ENCOUNTER — Ambulatory Visit: Admit: 2020-05-13 | Discharge: 2020-05-13 | Payer: MEDICARE | Attending: Family | Primary: Family

## 2020-05-13 ENCOUNTER — Ambulatory Visit: Attending: Family | Primary: Family

## 2020-05-13 DIAGNOSIS — Z780 Asymptomatic menopausal state: Secondary | ICD-10-CM

## 2020-05-13 NOTE — Progress Notes (Signed)
Emily Henson is a 86 y.o. female presenting today for Hypertension (follow-up)  .     Chief Complaint   Patient presents with   ??? Hypertension     follow-up       HPI:  Tarrin Ahlquist presents to the office today for HTN follow-up care. She feels well today and denies any chest pain or palpitation. She is followed by Cardiology and has a medical diagnosis for atrial fibrillation and pacemaker.   She is compliant with the medication treatment plan and denies any side effects or adverse reactions to the medication treatment plan.   Hy[ertriglceridemia- prescribed Fenofibrate daily. Needs fasting labs.   ROS    ROS:  History obtained from the patient intake forms which are reviewed with the patient  ?? General: negative for - chills, fever, weight changes or malaise  ?? HEENT: no sore throat, nasal congestion, vision problems or ear problems  ?? Respiratory: no cough, shortness of breath, or wheezing  ?? Cardiovascular: no chest pain, palpitations, or dyspnea on exertion  ?? Gastrointestinal: no abdominal pain, N/V, change in bowel habits, or black or bloody stools  ?? Musculoskeletal: no back pain, joint pain, joint stiffness, muscle pain or muscle weakness  ?? Neurological: no numbness, tingling, headache or dizziness  ?? Endo:  No polyuria or polydipsia.   ?? GU: no hematuria, dysuria, frequency, hesitancy, or nocturia.    ?? Psychological: negative for - anxiety, depression, sleep disturbances, suicidal or homicidal ideations    Allergies   Allergen Reactions   ??? Caffeine Palpitations       PHQ Screening   3 most recent PHQ Screens 05/13/2020   Little interest or pleasure in doing things Not at all   Feeling down, depressed, irritable, or hopeless Not at all   Total Score PHQ 2 0       History  Past Medical History:   Diagnosis Date   ??? Arrhythmia     Atrial Fibrillation   ??? Arthritis    ??? CAD (coronary artery disease)    ??? Hypercholesterolemia     hypertriglycerides   ??? Hypertension        Past Surgical History:    Procedure Laterality Date   ??? HX APPENDECTOMY     ??? HX BREAST REDUCTION  1990   ??? HX CHOLECYSTECTOMY     ??? HX COLONOSCOPY     ??? HX GYN      hysterectomy   ??? HX PACEMAKER     ??? HX TOTAL COLECTOMY     ??? HX TOTAL COLECTOMY         Social History     Socioeconomic History   ??? Marital status: MARRIED     Spouse name: Not on file   ??? Number of children: Not on file   ??? Years of education: Not on file   ??? Highest education level: Not on file   Occupational History   ??? Not on file   Tobacco Use   ??? Smoking status: Former Smoker   ??? Smokeless tobacco: Never Used   Substance and Sexual Activity   ??? Alcohol use: No   ??? Drug use: No   ??? Sexual activity: Never     Partners: Male   Other Topics Concern   ??? Not on file   Social History Narrative   ??? Not on file     Social Determinants of Health     Financial Resource Strain:    ??? Difficulty of Paying  Living Expenses:    Food Insecurity:    ??? Worried About Programme researcher, broadcasting/film/video in the Last Year:    ??? Barista in the Last Year:    Transportation Needs:    ??? Freight forwarder (Medical):    ??? Lack of Transportation (Non-Medical):    Physical Activity:    ??? Days of Exercise per Week:    ??? Minutes of Exercise per Session:    Stress:    ??? Feeling of Stress :    Social Connections:    ??? Frequency of Communication with Friends and Family:    ??? Frequency of Social Gatherings with Friends and Family:    ??? Attends Religious Services:    ??? Database administrator or Organizations:    ??? Attends Engineer, structural:    ??? Marital Status:    Intimate Programme researcher, broadcasting/film/video Violence:    ??? Fear of Current or Ex-Partner:    ??? Emotionally Abused:    ??? Physically Abused:    ??? Sexually Abused:        Current Outpatient Medications   Medication Sig Dispense Refill   ??? metoprolol tartrate (LOPRESSOR) 25 mg tablet Take 1 Tablet by mouth two (2) times a day. 180 Tablet 1   ??? metoprolol tartrate (LOPRESSOR) 50 mg tablet Take 1 Tablet by mouth two (2) times a day. 180 Tablet 1   ??? hydroCHLOROthiazide  (HYDRODIURIL) 12.5 mg tablet Take 1 Tablet by mouth daily. 180 Tablet 1   ??? fenofibrate nanocrystallized (TRICOR) 145 mg tablet Take 1 Tablet by mouth daily. 180 Tablet 1   ??? apixaban (ELIQUIS) 5 mg tablet Take 1 Tab by mouth two (2) times a day. 180 Tab 1   ??? cyanocobalamin (VITAMIN B-12) 1,000 mcg tablet Take 1,000 mcg by mouth daily.     ??? cinnamon bark (CINNAMON PO) Take  by mouth.     ??? meclizine (ANTIVERT) 25 mg tablet Take 1 Tab by mouth every six (6) hours as needed for Dizziness. 120 Tab 1   ??? verapamil ER (VERELAN) 240 mg ER capsule Take 240 mg by mouth daily.     ??? cholecalciferol, vitamin D3, (VITAMIN D3) 2,000 unit tab Take 1 Tab by mouth daily. 30 Tab 2   ??? nitrofurantoin, macrocrystal-monohydrate, (Macrobid) 100 mg capsule Take 1 Cap by mouth two (2) times a day. (Patient not taking: Reported on 05/13/2020) 14 Cap 0         Vitals:    05/13/20 1122   BP: 136/86   Pulse: (!) 109   Resp: 16   Temp: (!) 95.4 ??F (35.2 ??C)   TempSrc: Oral   SpO2: 100%   Weight: 183 lb (83 kg)   Height: 5\' 5"  (1.651 m)   PainSc:   0 - No pain       Physical Exam  Vitals and nursing note reviewed.   Constitutional:       Appearance: Normal appearance.   Cardiovascular:      Rate and Rhythm: Normal rate and regular rhythm.      Pulses: Normal pulses.   Pulmonary:      Effort: Pulmonary effort is normal.      Breath sounds: Normal breath sounds.   Skin:     General: Skin is warm and dry.   Neurological:      Mental Status: She is alert.         No visits with results within 3 Month(s) from  this visit.   Latest known visit with results is:   Office Visit on 02/04/2020   Component Date Value Ref Range Status   ??? Color (UA POC) 02/04/2020 Yellow   Final   ??? Clarity (UA POC) 02/04/2020 Cloudy   Final   ??? Glucose (UA POC) 02/04/2020 Negative  Negative Final   ??? Bilirubin (UA POC) 02/04/2020 Negative  Negative Final   ??? Ketones (UA POC) 02/04/2020 Negative  Negative Final   ??? Specific gravity (UA POC) 02/04/2020 1.025  1.001 - 1.035  Final   ??? Blood (UA POC) 02/04/2020 2+  Negative Final   ??? pH (UA POC) 02/04/2020 5.0  4.6 - 8.0 Final   ??? Protein (UA POC) 02/04/2020 1+  Negative Final   ??? Urobilinogen (UA POC) 02/04/2020 0.2 mg/dL  0.2 - 1 Final   ??? Nitrites (UA POC) 02/04/2020 Negative  Negative Final   ??? Leukocyte esterase (UA POC) 02/04/2020 3+  Negative Final       No results found for any visits on 05/13/20.    Patient Care Team:  Patient Care Team:  Candie Echevaria, NP as PCP - General (Nurse Practitioner)  Candie Echevaria, NP as PCP - [facility] Empaneled Provider      Assessment / Plan:      ICD-10-CM ICD-9-CM    1. Menopause  Z78.0 627.2 DEXA BONE DENSITY STUDY AXIAL   2. Atrial fibrillation, unspecified type ([place])  I48.91 427.31    3. Essential hypertension  I10 401.9 CBC WITH AUTOMATED DIFF      METABOLIC PANEL, COMPREHENSIVE   4. Hypertriglyceridemia  E78.1 272.1 LIPID PANEL     Fasting labs  Dexa scan ordered  HTN- continue med plan.  Hypertriglycerides- continue fenofibrate  F/u in 6 months      Follow-up and Dispositions    ?? Return in about 6 months (around 11/13/2020).         I asked the patient if she  had any questions and answered her  questions.  The patient stated that she understands the treatment plan and agrees with the treatment plan    This document was created with a voice activated dictation system and may contain transcription errors.

## 2020-05-13 NOTE — Progress Notes (Signed)
Emily Henson presents today for   Chief Complaint   Patient presents with   . Hypertension     follow-up       Is someone accompanying this pt? son    Is the patient using any DME equipment during OV? cane    Depression Screening:  3 most recent PHQ Screens 05/13/2020   Little interest or pleasure in doing things Not at all   Feeling down, depressed, irritable, or hopeless Not at all   Total Score PHQ 2 0       Learning Assessment:  Learning Assessment 08/21/2017   PRIMARY LEARNER Patient   HIGHEST LEVEL OF EDUCATION - PRIMARY LEARNER  GRADUATED HIGH SCHOOL OR GED   BARRIERS PRIMARY LEARNER VISUAL   CO-LEARNER CAREGIVER No   PRIMARY LANGUAGE ENGLISH   LEARNER PREFERENCE PRIMARY OTHER (COMMENT)   ANSWERED BY patient   RELATIONSHIP SELF       Abuse Screening:  Abuse Screening Questionnaire 08/21/2017   Do you ever feel afraid of your partner? N   Are you in a relationship with someone who physically or mentally threatens you? N   Is it safe for you to go home? Y       Fall Risk  Fall Risk Assessment, last 12 mths 05/13/2020   Able to walk? Yes   Fall in past 12 months? 1   Do you feel unsteady? 0   Are you worried about falling 0   Is TUG test greater than 12 seconds? 0   Is the gait abnormal? 0   Number of falls in past 12 months 1   Fall with injury? 0       Health Maintenance reviewed and discussed and ordered per Provider.    Health Maintenance Due   Topic Date Due   . COVID-19 Vaccine (1) Never done   . DTaP/Tdap/Td series (1 - Tdap) Never done   . Shingrix Vaccine Age 56> (1 of 2) Never done   . Bone Densitometry (Dexa) Screening  Never done   .      Coordination of Care:  1. Have you been to the ER, urgent care clinic since your last visit? Hospitalized since your last visit? no    2. Have you seen or consulted any other health care providers outside of the [facility] since your last visit? Include any pap smears or colon screening. no

## 2020-05-13 NOTE — Progress Notes (Signed)
Emily Henson is a 86 y.o. female presenting today for Hypertension (follow-up)  .     Chief Complaint   Patient presents with   ??? Hypertension     follow-up       HPI:  Emily Henson presents to the office today for ***    ROS    Allergies   Allergen Reactions   ??? Caffeine Palpitations       PHQ Screening   3 most recent PHQ Screens 05/13/2020   Little interest or pleasure in doing things Not at all   Feeling down, depressed, irritable, or hopeless Not at all   Total Score PHQ 2 0       History  Past Medical History:   Diagnosis Date   ??? Arrhythmia     Atrial Fibrillation   ??? Arthritis    ??? CAD (coronary artery disease)    ??? Hypercholesterolemia     hypertriglycerides   ??? Hypertension        Past Surgical History:   Procedure Laterality Date   ??? HX APPENDECTOMY     ??? HX BREAST REDUCTION  1990   ??? HX CHOLECYSTECTOMY     ??? HX COLONOSCOPY     ??? HX GYN      hysterectomy   ??? HX PACEMAKER     ??? HX TOTAL COLECTOMY     ??? HX TOTAL COLECTOMY         Social History     Socioeconomic History   ??? Marital status: MARRIED     Spouse name: Not on file   ??? Number of children: Not on file   ??? Years of education: Not on file   ??? Highest education level: Not on file   Occupational History   ??? Not on file   Tobacco Use   ??? Smoking status: Former Smoker   ??? Smokeless tobacco: Never Used   Substance and Sexual Activity   ??? Alcohol use: No   ??? Drug use: No   ??? Sexual activity: Never     Partners: Male   Other Topics Concern   ??? Not on file   Social History Narrative   ??? Not on file     Social Determinants of Health     Financial Resource Strain:    ??? Difficulty of Paying Living Expenses:    Food Insecurity:    ??? Worried About Programme researcher, broadcasting/film/video in the Last Year:    ??? Barista in the Last Year:    Transportation Needs:    ??? Freight forwarder (Medical):    ??? Lack of Transportation (Non-Medical):    Physical Activity:    ??? Days of Exercise per Week:    ??? Minutes of Exercise per Session:    Stress:    ??? Feeling of Stress :     Social Connections:    ??? Frequency of Communication with Friends and Family:    ??? Frequency of Social Gatherings with Friends and Family:    ??? Attends Religious Services:    ??? Database administrator or Organizations:    ??? Attends Engineer, structural:    ??? Marital Status:    Intimate Programme researcher, broadcasting/film/video Violence:    ??? Fear of Current or Ex-Partner:    ??? Emotionally Abused:    ??? Physically Abused:    ??? Sexually Abused:        Current Outpatient Medications   Medication Sig Dispense Refill   ???  metoprolol tartrate (LOPRESSOR) 25 mg tablet Take 1 Tablet by mouth two (2) times a day. 180 Tablet 1   ??? metoprolol tartrate (LOPRESSOR) 50 mg tablet Take 1 Tablet by mouth two (2) times a day. 180 Tablet 1   ??? hydroCHLOROthiazide (HYDRODIURIL) 12.5 mg tablet Take 1 Tablet by mouth daily. 180 Tablet 1   ??? fenofibrate nanocrystallized (TRICOR) 145 mg tablet Take 1 Tablet by mouth daily. 180 Tablet 1   ??? apixaban (ELIQUIS) 5 mg tablet Take 1 Tab by mouth two (2) times a day. 180 Tab 1   ??? cyanocobalamin (VITAMIN B-12) 1,000 mcg tablet Take 1,000 mcg by mouth daily.     ??? cinnamon bark (CINNAMON PO) Take  by mouth.     ??? meclizine (ANTIVERT) 25 mg tablet Take 1 Tab by mouth every six (6) hours as needed for Dizziness. 120 Tab 1   ??? verapamil ER (VERELAN) 240 mg ER capsule Take 240 mg by mouth daily.     ??? cholecalciferol, vitamin D3, (VITAMIN D3) 2,000 unit tab Take 1 Tab by mouth daily. 30 Tab 2   ??? nitrofurantoin, macrocrystal-monohydrate, (Macrobid) 100 mg capsule Take 1 Cap by mouth two (2) times a day. (Patient not taking: Reported on 05/13/2020) 14 Cap 0         Vitals:    05/13/20 1122   BP: 136/86   Pulse: (!) 109   Resp: 16   Temp: (!) 95.4 ??F (35.2 ??C)   TempSrc: Oral   SpO2: 100%   Weight: 183 lb (83 kg)   Height: 5\' 5"  (1.651 m)   PainSc:   0 - No pain       Physical Exam  Vitals and nursing note reviewed.   Constitutional:       Appearance: Normal appearance.   Cardiovascular:      Rate and Rhythm: Normal rate and  regular rhythm.      Pulses: Normal pulses.   Pulmonary:      Effort: Pulmonary effort is normal.      Breath sounds: Normal breath sounds.   Skin:     General: Skin is warm and dry.   Neurological:      Mental Status: She is alert.         No visits with results within 3 Month(s) from this visit.   Latest known visit with results is:   Office Visit on 02/04/2020   Component Date Value Ref Range Status   ??? Color (UA POC) 02/04/2020 Yellow   Final   ??? Clarity (UA POC) 02/04/2020 Cloudy   Final   ??? Glucose (UA POC) 02/04/2020 Negative  Negative Final   ??? Bilirubin (UA POC) 02/04/2020 Negative  Negative Final   ??? Ketones (UA POC) 02/04/2020 Negative  Negative Final   ??? Specific gravity (UA POC) 02/04/2020 1.025  1.001 - 1.035 Final   ??? Blood (UA POC) 02/04/2020 2+  Negative Final   ??? pH (UA POC) 02/04/2020 5.0  4.6 - 8.0 Final   ??? Protein (UA POC) 02/04/2020 1+  Negative Final   ??? Urobilinogen (UA POC) 02/04/2020 0.2 mg/dL  0.2 - 1 Final   ??? Nitrites (UA POC) 02/04/2020 Negative  Negative Final   ??? Leukocyte esterase (UA POC) 02/04/2020 3+  Negative Final       No results found for any visits on 05/13/20.    Patient Care Team:  Patient Care Team:  Candie Echevaria, NP as PCP - General (Nurse Practitioner)  Paula Libra, NP as PCP - [facility] Empaneled Provider      Assessment / Plan:      ICD-10-CM ICD-9-CM    1. Menopause  Z78.0 627.2    2. Atrial fibrillation, unspecified type (HCC)  I48.91 427.31              I asked the patient if she  had any questions and answered her  questions.  The patient stated that she understands the treatment plan and agrees with the treatment plan    This document was created with a voice activated dictation system and may contain transcription errors.

## 2020-05-13 NOTE — Progress Notes (Signed)
Layliana Elvin presents today for   Chief Complaint   Patient presents with   ??? Hypertension     follow-up       Is someone accompanying this pt? son    Is the patient using any DME equipment during OV? cane    Depression Screening:  3 most recent PHQ Screens 05/13/2020   Little interest or pleasure in doing things Not at all   Feeling down, depressed, irritable, or hopeless Not at all   Total Score PHQ 2 0       Learning Assessment:  Learning Assessment 08/21/2017   PRIMARY LEARNER Patient   HIGHEST LEVEL OF EDUCATION - PRIMARY LEARNER  GRADUATED HIGH SCHOOL OR GED   BARRIERS PRIMARY LEARNER VISUAL   CO-LEARNER CAREGIVER No   PRIMARY LANGUAGE ENGLISH   LEARNER PREFERENCE PRIMARY OTHER (COMMENT)   ANSWERED BY patient   RELATIONSHIP SELF       Abuse Screening:  Abuse Screening Questionnaire 08/21/2017   Do you ever feel afraid of your partner? N   Are you in a relationship with someone who physically or mentally threatens you? N   Is it safe for you to go home? Y       Fall Risk  Fall Risk Assessment, last 12 mths 05/13/2020   Able to walk? Yes   Fall in past 12 months? 1   Do you feel unsteady? 0   Are you worried about falling 0   Is TUG test greater than 12 seconds? 0   Is the gait abnormal? 0   Number of falls in past 12 months 1   Fall with injury? 0       Health Maintenance reviewed and discussed and ordered per Provider.    Health Maintenance Due   Topic Date Due   ??? COVID-19 Vaccine (1) Never done   ??? DTaP/Tdap/Td series (1 - Tdap) Never done   ??? Shingrix Vaccine Age 50> (1 of 2) Never done   ??? Bone Densitometry (Dexa) Screening  Never done   .      Coordination of Care:  1. Have you been to the ER, urgent care clinic since your last visit? Hospitalized since your last visit? no    2. Have you seen or consulted any other health care providers outside of the [facility] since your last visit? Include any pap smears or colon screening. no

## 2020-05-19 ENCOUNTER — Encounter: Primary: Family

## 2020-05-19 ENCOUNTER — Inpatient Hospital Stay: Admit: 2020-05-19 | Payer: MEDICARE | Attending: Family | Primary: Family

## 2020-05-19 DIAGNOSIS — Z780 Asymptomatic menopausal state: Secondary | ICD-10-CM

## 2020-05-19 NOTE — Progress Notes (Signed)
Please let patient know the xray results showed osteopenia. Vitamin D and Calcium daily.

## 2020-08-10 ENCOUNTER — Ambulatory Visit: Attending: Family | Primary: Family

## 2020-08-10 ENCOUNTER — Ambulatory Visit: Admit: 2020-08-10 | Discharge: 2020-08-13 | Payer: MEDICARE | Attending: Family | Primary: Family

## 2020-08-10 DIAGNOSIS — R059 Cough, unspecified: Secondary | ICD-10-CM

## 2020-08-10 MED ORDER — BENZONATATE 100 MG CAP
100 mg | ORAL_CAPSULE | Freq: Three times a day (TID) | ORAL | 0 refills | Status: AC | PRN
Start: 2020-08-10 — End: 2020-08-17

## 2020-08-10 NOTE — Progress Notes (Signed)
Patient here for follow up on her chronic conditions, she says she had a dry cough x 1 month.    1. Have you been to the ER, urgent care clinic since your last visit?  Hospitalized since your last visit?No  2. Have you seen or consulted any other health care providers outside of the [facility] since your last visit?  Include any pap smears or colon screening. No    Medication reconciliation has been completed with patient.  Care team discussed/updated as well as pharmacy.    Health Maintenance Due   Topic Date Due   . COVID-19 Vaccine (1) Never done   . DTaP/Tdap/Td series (1 - Tdap) Never done   . Shingrix Vaccine Age 50> (1 of 2) Never done

## 2020-08-10 NOTE — Progress Notes (Signed)
Emily Henson is a 86 y.o. female presenting today for Follow Up Chronic Condition and Cough  .     Chief Complaint   Patient presents with   ??? Follow Up Chronic Condition   ??? Cough       HPI:  Emily Henson presents to the office today for tension follow-up care.  Patient initial BP reading was 141/80 and repeat 114/72.  She is negative for any chest pain or palpitation.  Patient is complaining of a cough only after eating dinner.  Per her son did eat spicy food and does not think is related to reflux.  Negative for cough when lying flat or while sleeping.    ROS    ROS:  History obtained from the patient intake forms which are reviewed with the patient  ?? General: negative for - chills, fever, weight changes or malaise  ?? HEENT: Blind  ?? Respiratory: Intermittent cough  ?? Cardiovascular: no chest pain, palpitations, or dyspnea on exertion  ?? Gastrointestinal: no abdominal pain, N/V, change in bowel habits, or black or bloody stools  ?? Musculoskeletal: no back pain, joint pain, joint stiffness, muscle pain or muscle weakness  ?? Neurological: no numbness, tingling, headache or dizziness  ?? Endo:  No polyuria or polydipsia.   ?? GU: no hematuria, dysuria, frequency, hesitancy, or nocturia.    ?? Psychological: negative for - anxiety, depression, sleep disturbances, suicidal or homicidal ideations    Allergies   Allergen Reactions   ??? Other Food Rash     "Preservatives"   ??? Caffeine Palpitations       PHQ Screening   3 most recent PHQ Screens 05/13/2020   Little interest or pleasure in doing things Not at all   Feeling down, depressed, irritable, or hopeless Not at all   Total Score PHQ 2 0       History  Past Medical History:   Diagnosis Date   ??? Arrhythmia     Atrial Fibrillation   ??? Arthritis    ??? CAD (coronary artery disease)    ??? Hypercholesterolemia     hypertriglycerides   ??? Hypertension        Past Surgical History:   Procedure Laterality Date   ??? HX APPENDECTOMY     ??? HX BREAST REDUCTION  1990   ??? HX  CHOLECYSTECTOMY     ??? HX COLONOSCOPY     ??? HX GYN      hysterectomy   ??? HX PACEMAKER     ??? HX TOTAL COLECTOMY     ??? HX TOTAL COLECTOMY         Social History     Socioeconomic History   ??? Marital status: MARRIED     Spouse name: Not on file   ??? Number of children: Not on file   ??? Years of education: Not on file   ??? Highest education level: Not on file   Occupational History   ??? Not on file   Tobacco Use   ??? Smoking status: Former Smoker   ??? Smokeless tobacco: Never Used   Substance and Sexual Activity   ??? Alcohol use: No   ??? Drug use: No   ??? Sexual activity: Never     Partners: Male   Other Topics Concern   ??? Not on file   Social History Narrative   ??? Not on file     Social Determinants of Health     Financial Resource Strain:    ???  Difficulty of Paying Living Expenses:    Food Insecurity:    ??? Worried About Programme researcher, broadcasting/film/video in the Last Year:    ??? Barista in the Last Year:    Transportation Needs:    ??? Freight forwarder (Medical):    ??? Lack of Transportation (Non-Medical):    Physical Activity:    ??? Days of Exercise per Week:    ??? Minutes of Exercise per Session:    Stress:    ??? Feeling of Stress :    Social Connections:    ??? Frequency of Communication with Friends and Family:    ??? Frequency of Social Gatherings with Friends and Family:    ??? Attends Religious Services:    ??? Database administrator or Organizations:    ??? Attends Engineer, structural:    ??? Marital Status:    Intimate Programme researcher, broadcasting/film/video Violence:    ??? Fear of Current or Ex-Partner:    ??? Emotionally Abused:    ??? Physically Abused:    ??? Sexually Abused:        Current Outpatient Medications   Medication Sig Dispense Refill   ??? benzonatate (TESSALON) 100 mg capsule Take 1 Capsule by mouth three (3) times daily as needed for Cough for up to 7 days. 21 Capsule 0   ??? metoprolol tartrate (LOPRESSOR) 25 mg tablet Take 1 Tablet by mouth two (2) times a day. 180 Tablet 1   ??? metoprolol tartrate (LOPRESSOR) 50 mg tablet Take 1 Tablet by mouth two (2)  times a day. 180 Tablet 1   ??? hydroCHLOROthiazide (HYDRODIURIL) 12.5 mg tablet Take 1 Tablet by mouth daily. 180 Tablet 1   ??? fenofibrate nanocrystallized (TRICOR) 145 mg tablet Take 1 Tablet by mouth daily. 180 Tablet 1   ??? apixaban (ELIQUIS) 5 mg tablet Take 1 Tab by mouth two (2) times a day. 180 Tab 1   ??? cyanocobalamin (VITAMIN B-12) 1,000 mcg tablet Take 1,000 mcg by mouth daily.     ??? cinnamon bark (CINNAMON PO) Take  by mouth.     ??? verapamil ER (VERELAN) 240 mg ER capsule Take 240 mg by mouth daily.     ??? cholecalciferol, vitamin D3, (VITAMIN D3) 2,000 unit tab Take 1 Tab by mouth daily. 30 Tab 2   ??? meclizine (ANTIVERT) 25 mg tablet Take 1 Tab by mouth every six (6) hours as needed for Dizziness. (Patient not taking: Reported on 08/10/2020) 120 Tab 1         Vitals:    08/10/20 1629 08/10/20 1700   BP: (!) 141/80 114/72   Pulse: 75    Resp: 14    Temp: 97.7 ??F (36.5 ??C)    TempSrc: Oral    SpO2: 99%    Weight: 184 lb 12.8 oz (83.8 kg)    PainSc:   0 - No pain        Physical Exam  Vitals and nursing note reviewed.   Constitutional:       Appearance: Normal appearance.   HENT:      Head: Normocephalic.   Cardiovascular:      Rate and Rhythm: Normal rate and regular rhythm.      Pulses: Normal pulses.      Heart sounds: Normal heart sounds.   Pulmonary:      Effort: Pulmonary effort is normal.      Breath sounds: Normal breath sounds.   Neurological:  Mental Status: She is alert.         No visits with results within 3 Month(s) from this visit.   Latest known visit with results is:   Office Visit on 02/04/2020   Component Date Value Ref Range Status   ??? Color (UA POC) 02/04/2020 Yellow   Final   ??? Clarity (UA POC) 02/04/2020 Cloudy   Final   ??? Glucose (UA POC) 02/04/2020 Negative  Negative Final   ??? Bilirubin (UA POC) 02/04/2020 Negative  Negative Final   ??? Ketones (UA POC) 02/04/2020 Negative  Negative Final   ??? Specific gravity (UA POC) 02/04/2020 1.025  1.001 - 1.035 Final   ??? Blood (UA POC) 02/04/2020  2+  Negative Final   ??? pH (UA POC) 02/04/2020 5.0  4.6 - 8.0 Final   ??? Protein (UA POC) 02/04/2020 1+  Negative Final   ??? Urobilinogen (UA POC) 02/04/2020 0.2 mg/dL  0.2 - 1 Final   ??? Nitrites (UA POC) 02/04/2020 Negative  Negative Final   ??? Leukocyte esterase (UA POC) 02/04/2020 3+  Negative Final       No results found for any visits on 08/10/20.    Patient Care Team:  Patient Care Team:  Paula Libra, NP as PCP - General (Nurse Practitioner)  Paula Libra, NP as PCP - [facility] Empaneled Provider      Assessment / Plan:      ICD-10-CM ICD-9-CM    1. Cough  R05 786.2 benzonatate (TESSALON) 100 mg capsule   2. Essential hypertension  I10 401.9      Hypertension-no change to current treatment plan.  Negative for side effects or adverse reaction  Cough-Tessalon Perles given.  If no improvement will follow up    Follow-up and Dispositions    ?? Return in about 3 months (around 11/10/2020).         I asked the patient if she  had any questions and answered her  questions.  The patient stated that she understands the treatment plan and agrees with the treatment plan    This document was created with a voice activated dictation system and may contain transcription errors.

## 2020-11-04 ENCOUNTER — Ambulatory Visit: Admit: 2020-11-04 | Discharge: 2020-11-04 | Payer: MEDICARE | Attending: Family | Primary: Family

## 2020-11-04 ENCOUNTER — Ambulatory Visit: Attending: Family | Primary: Family

## 2020-11-04 DIAGNOSIS — I10 Essential (primary) hypertension: Secondary | ICD-10-CM

## 2020-11-04 MED ORDER — APIXABAN 5 MG TABLET
5 mg | ORAL_TABLET | Freq: Two times a day (BID) | ORAL | 1 refills | Status: DC
Start: 2020-11-04 — End: 2020-11-04

## 2020-11-04 MED ORDER — FENOFIBRATE NANOCRYSTALLIZED 145 MG TAB
145 mg | ORAL_TABLET | Freq: Every day | ORAL | 1 refills | Status: DC
Start: 2020-11-04 — End: 2021-12-06

## 2020-11-04 MED ORDER — METOPROLOL TARTRATE 25 MG TAB
25 mg | ORAL_TABLET | Freq: Two times a day (BID) | ORAL | 1 refills | Status: DC
Start: 2020-11-04 — End: 2021-05-05

## 2020-11-04 MED ORDER — METOPROLOL TARTRATE 50 MG TAB
50 mg | ORAL_TABLET | Freq: Two times a day (BID) | ORAL | 1 refills | Status: DC
Start: 2020-11-04 — End: 2021-05-05

## 2020-11-04 MED ORDER — CYANOCOBALAMIN 1,000 MCG TAB
1000 mcg | ORAL_TABLET | Freq: Every day | ORAL | 1 refills | Status: AC
Start: 2020-11-04 — End: ?

## 2020-11-04 MED ORDER — HYDROCHLOROTHIAZIDE 12.5 MG TAB
12.5 mg | ORAL_TABLET | Freq: Every day | ORAL | 1 refills | Status: DC
Start: 2020-11-04 — End: 2021-02-09

## 2020-11-04 MED ORDER — DONEPEZIL 5 MG TAB
5 mg | ORAL_TABLET | Freq: Every evening | ORAL | 1 refills | Status: DC
Start: 2020-11-04 — End: 2021-05-05

## 2020-11-04 NOTE — Progress Notes (Signed)
 Alanta Dyches presents today for   Chief Complaint   Patient presents with   . Hypertension     6 month follow-up       Is someone accompanying this pt? no    Is the patient using any DME equipment during OV? no    Depression Screening:  3 most recent PHQ Screens 11/04/2020   Little interest or pleasure in doing things Not at all   Feeling down, depressed, irritable, or hopeless Not at all   Total Score PHQ 2 0       Learning Assessment:  Learning Assessment 08/21/2017   PRIMARY LEARNER Patient   HIGHEST LEVEL OF EDUCATION - PRIMARY LEARNER  GRADUATED HIGH SCHOOL OR GED   BARRIERS PRIMARY LEARNER VISUAL   CO-LEARNER CAREGIVER No   PRIMARY LANGUAGE ENGLISH   LEARNER PREFERENCE PRIMARY OTHER (COMMENT)   ANSWERED BY patient   RELATIONSHIP SELF       Abuse Screening:  Abuse Screening Questionnaire 11/04/2020   Do you ever feel afraid of your partner? N   Are you in a relationship with someone who physically or mentally threatens you? N   Is it safe for you to go home? Y       Fall Risk  Fall Risk Assessment, last 12 mths 11/04/2020   Able to walk? No   Fall in past 12 months? -   Do you feel unsteady? -   Are you worried about falling -   Is TUG test greater than 12 seconds? -   Is the gait abnormal? -   Number of falls in past 12 months -   Fall with injury? -       Health Maintenance reviewed and discussed and ordered per Provider.    Health Maintenance Due   Topic Date Due   . COVID-19 Vaccine (1) Never done   . DTaP/Tdap/Td series (1 - Tdap) Never done   . Shingrix Vaccine Age 86> (1 of 2) Never done   . Flu Vaccine (1) 08/18/2020   . Medicare Yearly Exam  11/27/2020   .      Coordination of Care:  1. Have you been to the ER, urgent care clinic since your last visit? Hospitalized since your last visit? no    2. Have you seen or consulted any other health care providers outside of the [facility] since your last visit? Include any pap smears or colon screening. no

## 2020-11-04 NOTE — Progress Notes (Signed)
Emily Henson is a 86 y.o. female presenting today for Hypertension (6 month follow-up)  .     Chief Complaint   Patient presents with   ??? Hypertension     6 month follow-up       HPI:  Emily Henson presents to the office today for HTN follow-up care. She feels well today and denies any chest pain or palpitation. She is followed by Cardiology and has a medical diagnosis for atrial fibrillation and pacemaker.   She is compliant with the medication treatment plan and denies any side effects or adverse reactions to the medication treatment plan.   Hy[ertriglceridemia- prescribed Fenofibrate daily. Needs fasting labs.   Family is concerned that patient has some mild memory loss.  Per her son the patient is repeating herself during conversations.  He notes recently that she told her granddaughter the same story 3 times in 1 sitting.  ROS    ROS:  History obtained from the patient intake forms which are reviewed with the patient  ?? General: negative for - chills, fever, weight changes or malaise  ?? HEENT: no sore throat, nasal congestion, vision problems or ear problems  ?? Respiratory: no cough, shortness of breath, or wheezing  ?? Cardiovascular: no chest pain, palpitations, or dyspnea on exertion  ?? Gastrointestinal: no abdominal pain, N/V, change in bowel habits, or black or bloody stools  ?? Musculoskeletal: no back pain, joint pain, joint stiffness, muscle pain or muscle weakness  ?? Neurological: no numbness, tingling, headache or dizziness  ?? Endo:  No polyuria or polydipsia.   ?? GU: no hematuria, dysuria, frequency, hesitancy, or nocturia.    ?? Psychological: negative for - anxiety, depression, sleep disturbances, suicidal or homicidal ideations    Allergies   Allergen Reactions   ??? Other Food Rash     "Preservatives"   ??? Caffeine Palpitations       PHQ Screening   3 most recent PHQ Screens 11/04/2020   Little interest or pleasure in doing things Not at all   Feeling down, depressed, irritable, or hopeless Not at  all   Total Score PHQ 2 0       History  Past Medical History:   Diagnosis Date   ??? Arrhythmia     Atrial Fibrillation   ??? Arthritis    ??? CAD (coronary artery disease)    ??? Hypercholesterolemia     hypertriglycerides   ??? Hypertension        Past Surgical History:   Procedure Laterality Date   ??? HX APPENDECTOMY     ??? HX BREAST REDUCTION  1990   ??? HX CHOLECYSTECTOMY     ??? HX COLONOSCOPY     ??? HX GYN      hysterectomy   ??? HX PACEMAKER     ??? HX TOTAL COLECTOMY     ??? HX TOTAL COLECTOMY         Social History     Socioeconomic History   ??? Marital status: MARRIED     Spouse name: Not on file   ??? Number of children: Not on file   ??? Years of education: Not on file   ??? Highest education level: Not on file   Occupational History   ??? Not on file   Tobacco Use   ??? Smoking status: Former Smoker   ??? Smokeless tobacco: Never Used   Substance and Sexual Activity   ??? Alcohol use: No   ??? Drug use: No   ???  Sexual activity: Never     Partners: Male   Other Topics Concern   ??? Not on file   Social History Narrative   ??? Not on file     Social Determinants of Health     Financial Resource Strain:    ??? Difficulty of Paying Living Expenses: Not on file   Food Insecurity:    ??? Worried About Running Out of Food in the Last Year: Not on file   ??? Ran Out of Food in the Last Year: Not on file   Transportation Needs:    ??? Lack of Transportation (Medical): Not on file   ??? Lack of Transportation (Non-Medical): Not on file   Physical Activity:    ??? Days of Exercise per Week: Not on file   ??? Minutes of Exercise per Session: Not on file   Stress:    ??? Feeling of Stress : Not on file   Social Connections:    ??? Frequency of Communication with Friends and Family: Not on file   ??? Frequency of Social Gatherings with Friends and Family: Not on file   ??? Attends Religious Services: Not on file   ??? Active Member of Clubs or Organizations: Not on file   ??? Attends Banker Meetings: Not on file   ??? Marital Status: Not on file   Intimate Partner  Violence:    ??? Fear of Current or Ex-Partner: Not on file   ??? Emotionally Abused: Not on file   ??? Physically Abused: Not on file   ??? Sexually Abused: Not on file   Housing Stability:    ??? Unable to Pay for Housing in the Last Year: Not on file   ??? Number of Places Lived in the Last Year: Not on file   ??? Unstable Housing in the Last Year: Not on file       Current Outpatient Medications   Medication Sig Dispense Refill   ??? metoprolol tartrate (LOPRESSOR) 50 mg tablet Take 1 Tablet by mouth two (2) times a day. 180 Tablet 1   ??? metoprolol tartrate (LOPRESSOR) 25 mg tablet Take 1 Tablet by mouth two (2) times a day. 180 Tablet 1   ??? hydroCHLOROthiazide (HYDRODIURIL) 12.5 mg tablet Take 1 Tablet by mouth daily. 180 Tablet 1   ??? fenofibrate nanocrystallized (TRICOR) 145 mg tablet Take 1 Tablet by mouth daily. 180 Tablet 1   ??? cyanocobalamin (Vitamin B-12) 1,000 mcg tablet Take 1 Tablet by mouth daily. 90 Tablet 1   ??? donepeziL (ARICEPT) 5 mg tablet Take 1 Tablet by mouth nightly. 90 Tablet 1   ??? cinnamon bark (CINNAMON PO) Take  by mouth.     ??? verapamil ER (VERELAN) 240 mg ER capsule Take 240 mg by mouth daily.     ??? cholecalciferol, vitamin D3, (VITAMIN D3) 2,000 unit tab Take 1 Tab by mouth daily. 30 Tab 2   ??? meclizine (ANTIVERT) 25 mg tablet Take 1 Tab by mouth every six (6) hours as needed for Dizziness. (Patient not taking: Reported on 08/10/2020) 120 Tab 1         Vitals:    11/04/20 1125   BP: (!) 155/82   Pulse: (!) 120   Resp: 16   Temp: 97 ??F (36.1 ??C)   TempSrc: Oral   SpO2: 98%   Weight: 164 lb (74.4 kg)   Height: 5\' 5"  (1.651 m)   PainSc:   0 - No pain  Physical Exam  Vitals and nursing note reviewed.   Constitutional:       Appearance: Normal appearance.   Cardiovascular:      Rate and Rhythm: Normal rate and regular rhythm.      Pulses: Normal pulses.   Pulmonary:      Effort: Pulmonary effort is normal.      Breath sounds: Normal breath sounds.   Skin:     General: Skin is warm and dry.    Neurological:      Mental Status: She is alert.   Psychiatric:         Mood and Affect: Mood normal.         Behavior: Behavior normal.         Thought Content: Thought content normal.      Comments: Normal verbal recall         No visits with results within 3 Month(s) from this visit.   Latest known visit with results is:   Office Visit on 02/04/2020   Component Date Value Ref Range Status   ??? Color (UA POC) 02/04/2020 Yellow   Final   ??? Clarity (UA POC) 02/04/2020 Cloudy   Final   ??? Glucose (UA POC) 02/04/2020 Negative  Negative Final   ??? Bilirubin (UA POC) 02/04/2020 Negative  Negative Final   ??? Ketones (UA POC) 02/04/2020 Negative  Negative Final   ??? Specific gravity (UA POC) 02/04/2020 1.025  1.001 - 1.035 Final   ??? Blood (UA POC) 02/04/2020 2+  Negative Final   ??? pH (UA POC) 02/04/2020 5.0  4.6 - 8.0 Final   ??? Protein (UA POC) 02/04/2020 1+  Negative Final   ??? Urobilinogen (UA POC) 02/04/2020 0.2 mg/dL  0.2 - 1 Final   ??? Nitrites (UA POC) 02/04/2020 Negative  Negative Final   ??? Leukocyte esterase (UA POC) 02/04/2020 3+  Negative Final       No results found for any visits on 11/04/20.    Patient Care Team:  Patient Care Team:  Paula Libra, NP as PCP - General (Nurse Practitioner)  Paula Libra, NP as PCP - [facility] Empaneled Provider      Assessment / Plan:      ICD-10-CM ICD-9-CM    1. Hypertension, unspecified type  I10 401.9 metoprolol tartrate (LOPRESSOR) 50 mg tablet      metoprolol tartrate (LOPRESSOR) 25 mg tablet      hydroCHLOROthiazide (HYDRODIURIL) 12.5 mg tablet   2. Needs flu shot  Z23 V04.81 FLU (FLUAD QUAD INFLUENZA VACCINE,QUAD,ADJUVANTED)   3. Hypertriglyceridemia  E78.1 272.1 fenofibrate nanocrystallized (TRICOR) 145 mg tablet   4. Atrial fibrillation, unspecified type (HCC)  I48.91 427.31 DISCONTINUED: apixaban (ELIQUIS) 5 mg tablet   5. Vitamin B 12 deficiency  E53.8 266.2    6. Memory loss  R41.3 780.93 donepeziL (ARICEPT) 5 mg tablet     Fasting labs  HTN- continue med  plan.  Hypertriglycerides- continue fenofibrate  F/u in 6 months      Follow-up and Dispositions    ?? Return in about 6 months (around 05/04/2021).         I asked the patient if she  had any questions and answered her  questions.  The patient stated that she understands the treatment plan and agrees with the treatment plan    This document was created with a voice activated dictation system and may contain transcription errors.

## 2020-11-04 NOTE — Progress Notes (Signed)
Emily Henson is a 86 y.o. female who presents for routine immunizations.   She denies any symptoms , reactions or allergies that would exclude them from being immunized today.  Risks and adverse reactions were discussed and the VIS was given to them. All questions were addressed.  She refused to stay for observation. There were no reaction (s) observed when patient left clinic.

## 2021-02-09 ENCOUNTER — Encounter

## 2021-02-09 NOTE — Telephone Encounter (Signed)
 Requested Prescriptions     Pending Prescriptions Disp Refills   . hydroCHLOROthiazide (HYDRODIURIL) 12.5 mg tablet 180 Tablet 1     Sig: Take 1 Tablet by mouth daily.

## 2021-02-14 MED ORDER — HYDROCHLOROTHIAZIDE 12.5 MG TAB
12.5 mg | ORAL_TABLET | Freq: Every day | ORAL | 1 refills | Status: DC
Start: 2021-02-14 — End: 2021-05-05

## 2021-05-05 ENCOUNTER — Telehealth: Admit: 2021-05-05 | Payer: MEDICARE | Attending: Family | Primary: Family

## 2021-05-05 ENCOUNTER — Telehealth: Attending: Family | Primary: Family

## 2021-05-05 DIAGNOSIS — U071 COVID-19: Secondary | ICD-10-CM

## 2021-05-05 MED ORDER — BENZONATATE 100 MG CAP
100 mg | ORAL_CAPSULE | Freq: Three times a day (TID) | ORAL | 0 refills | Status: AC | PRN
Start: 2021-05-05 — End: 2021-05-12

## 2021-05-05 MED ORDER — DONEPEZIL 5 MG TAB
5 mg | ORAL_TABLET | Freq: Every evening | ORAL | 1 refills | Status: DC
Start: 2021-05-05 — End: 2021-10-11

## 2021-05-05 MED ORDER — METOPROLOL TARTRATE 25 MG TAB
25 mg | ORAL_TABLET | Freq: Two times a day (BID) | ORAL | 1 refills | Status: DC
Start: 2021-05-05 — End: 2021-10-11

## 2021-05-05 MED ORDER — HYDROCHLOROTHIAZIDE 12.5 MG TAB
12.5 mg | ORAL_TABLET | Freq: Every day | ORAL | 1 refills | Status: AC
Start: 2021-05-05 — End: ?

## 2021-05-05 MED ORDER — MECLIZINE 25 MG TAB
25 mg | ORAL_TABLET | Freq: Four times a day (QID) | ORAL | 1 refills | Status: DC | PRN
Start: 2021-05-05 — End: 2022-01-11

## 2021-05-05 MED ORDER — VERAPAMIL ER 240 MG 24 HR CAP
240 mg | ORAL_CAPSULE | Freq: Every day | ORAL | 1 refills | Status: DC
Start: 2021-05-05 — End: 2021-10-11

## 2021-05-05 MED ORDER — METOPROLOL TARTRATE 50 MG TAB
50 mg | ORAL_TABLET | Freq: Two times a day (BID) | ORAL | 1 refills | Status: DC
Start: 2021-05-05 — End: 2021-10-11

## 2021-05-05 NOTE — Progress Notes (Signed)
Emily Henson is a 87 y.o. female who was seen by synchronous (real-time) audio-video technology on 05/05/2021 for Follow Up Chronic Condition and Medication Refill    Assessment & Plan:   Diagnoses and all orders for this visit:    1. COVID-19    2. Memory loss  -     donepeziL (ARICEPT) 5 mg tablet; Take 1 Tablet by mouth nightly.    3. Hypertension, unspecified type  -     hydroCHLOROthiazide (HYDRODIURIL) 12.5 mg tablet; Take 1 Tablet by mouth daily.  -     metoprolol tartrate (LOPRESSOR) 25 mg tablet; Take 1 Tablet by mouth two (2) times a day.  -     verapamil ER (VERELAN) 240 mg ER capsule; Take 1 Capsule by mouth daily.  -     metoprolol tartrate (LOPRESSOR) 50 mg tablet; Take 1 Tablet by mouth two (2) times a day.    4. Dizziness  -     meclizine (ANTIVERT) 25 mg tablet; Take 1 Tablet by mouth every six (6) hours as needed for Dizziness.    5. Cough  -     benzonatate (TESSALON) 100 mg capsule; Take 1 Capsule by mouth three (3) times daily as needed for Cough for up to 7 days.          Subjective:     The patient presents for an Audio-visual teleconference appointment for hypertension, memory loss, atrial fibrillation and dyslipidemia follow-up care.  She feels well today and denies any chest pain, palpitation or lower extremity edema.  She is compliant with taking her medication daily and is requesting medication refills.      Hypertension-her BP has been controlled at home and her caregiver/son monitors her BP daily.    Atrial fibrillation-prescribed cardiology.   She has a medical diagnosis of sick sinus syndrome, cardiac pacemaker in situ, and permanent atrial fibrillation.      Patient was diagnosed with COVID on Apr 21, 2021.  Her symptoms have improved he continues to complain of a cough especially at night.  She is negative for any dyspnea or wheezing.  Prior to Admission medications    Medication Sig Start Date End Date Taking? Authorizing Provider   donepeziL (ARICEPT) 5 mg tablet Take 1 Tablet by  mouth nightly. 05/05/21  Yes Paula Libra, NP   hydroCHLOROthiazide (HYDRODIURIL) 12.5 mg tablet Take 1 Tablet by mouth daily. 05/05/21  Yes Paula Libra, NP   meclizine (ANTIVERT) 25 mg tablet Take 1 Tablet by mouth every six (6) hours as needed for Dizziness. 05/05/21  Yes Paula Libra, NP   metoprolol tartrate (LOPRESSOR) 25 mg tablet Take 1 Tablet by mouth two (2) times a day. 05/05/21  Yes Paula Libra, NP   verapamil ER (VERELAN) 240 mg ER capsule Take 1 Capsule by mouth daily. 05/05/21  Yes Paula Libra, NP   metoprolol tartrate (LOPRESSOR) 50 mg tablet Take 1 Tablet by mouth two (2) times a day. 05/05/21  Yes Paula Libra, NP   benzonatate (TESSALON) 100 mg capsule Take 1 Capsule by mouth three (3) times daily as needed for Cough for up to 7 days. 05/05/21 05/12/21 Yes Paula Libra, NP   fenofibrate nanocrystallized (TRICOR) 145 mg tablet Take 1 Tablet by mouth daily. 11/04/20  Yes Paula Libra, NP   cyanocobalamin (Vitamin B-12) 1,000 mcg tablet Take 1 Tablet by mouth daily. 11/04/20  Yes Paula Libra, NP   cinnamon bark (CINNAMON PO) Take  by mouth.   Yes Provider, Historical   cholecalciferol, vitamin D3, (VITAMIN D3) 2,000 unit tab Take 1 Tab by mouth daily. 09/26/17  Yes Paula Libra, NP     Patient Active Problem List   Diagnosis Code   ??? 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 Atrial fibrillation (HCC) I48.91   ??? Pacemaker Z95.0   ??? Hypertension I10   ??? Hypertriglyceridemia E78.1     Patient Active Problem List    Diagnosis Date Noted   ??? Blind in both eyes 06/05/2017   ??? Atrial fibrillation (HCC) 06/05/2017   ??? Pacemaker 06/05/2017   ??? Hypertension 06/05/2017   ??? Hypertriglyceridemia 06/05/2017     Current Outpatient Medications   Medication Sig Dispense Refill   ??? donepeziL (ARICEPT) 5 mg tablet Take 1 Tablet by mouth nightly. 90 Tablet 1   ??? hydroCHLOROthiazide (HYDRODIURIL) 12.5 mg tablet Take 1 Tablet by mouth daily. 180  Tablet 1   ??? meclizine (ANTIVERT) 25 mg tablet Take 1 Tablet by mouth every six (6) hours as needed for Dizziness. 120 Tablet 1   ??? metoprolol tartrate (LOPRESSOR) 25 mg tablet Take 1 Tablet by mouth two (2) times a day. 180 Tablet 1   ??? verapamil ER (VERELAN) 240 mg ER capsule Take 1 Capsule by mouth daily. 90 Capsule 1   ??? metoprolol tartrate (LOPRESSOR) 50 mg tablet Take 1 Tablet by mouth two (2) times a day. 180 Tablet 1   ??? benzonatate (TESSALON) 100 mg capsule Take 1 Capsule by mouth three (3) times daily as needed for Cough for up to 7 days. 21 Capsule 0   ??? fenofibrate nanocrystallized (TRICOR) 145 mg tablet Take 1 Tablet by mouth daily. 180 Tablet 1   ??? cyanocobalamin (Vitamin B-12) 1,000 mcg tablet Take 1 Tablet by mouth daily. 90 Tablet 1   ??? cinnamon bark (CINNAMON PO) Take  by mouth.     ??? cholecalciferol, vitamin D3, (VITAMIN D3) 2,000 unit tab Take 1 Tab by mouth daily. 30 Tab 2       ROS    Constitutional: No apparent distress noted  General- negative for fever, chills or fatigue  Eyes- blind in both eyes  CV- denies chest pain, palpitation  Pul: negative cough or SOB  GI: negative nausea, flank pain, diarrhea, constipation  Urinary:- No dysuria or polyuria  MS- negative myalgia, negative joint pain  Neuro- negative headache, dizziness or weakness  Skin- negative for rashes or lesions.  Psych- denies any anxiety or depression    Objective:     Patient-Reported Vitals 11/27/2019   Patient-Reported Weight 186   Patient-Reported Pulse 89   Patient-Reported Temperature 98.1   Patient-Reported Systolic  126   Patient-Reported Diastolic 83        [INSTRUCTIONS:  "[x] " Indicates a positive item  "[] " Indicates a negative item  -- DELETE ALL ITEMS NOT EXAMINED]    Constitutional: [x]  Appears well-developed and well-nourished [x]  No apparent distress      []  Abnormal -     Mental status: [x]  Alert and awake  [x]  Oriented to person/place/time [x]  Able to follow commands    []  Abnormal -     Eyes:   EOM    [x]    Normal    []  Abnormal -   Sclera  [x]   Normal    []  Abnormal -          Discharge [x]   None visible   []  Abnormal -  HENT: [x]  Normocephalic, atraumatic  []  Abnormal -   [x]  Mouth/Throat: Mucous membranes are moist    External Ears [x]  Normal  []  Abnormal -    Neck: [x]  No visualized mass []  Abnormal -     Pulmonary/Chest: [x]  Respiratory effort normal   [x]  No visualized signs of difficulty breathing or respiratory distress        []  Abnormal -      Musculoskeletal:   [x]  Normal gait with no signs of ataxia         [x]  Normal range of motion of neck        []  Abnormal -     Neurological:        [x]  No Facial Asymmetry (Cranial nerve 7 motor function) (limited exam due to video visit)          [x]  No gaze palsy        []  Abnormal -          Skin:        [x]  No significant exanthematous lesions or discoloration noted on facial skin         []  Abnormal -            Psychiatric:       [x]  Normal Affect []  Abnormal -        [x]  No Hallucinations    Other pertinent observable physical exam findings:-        We discussed the expected course, resolution and complications of the diagnosis(es) in detail.  Medication risks, benefits, costs, interactions, and alternatives were discussed as indicated.  I advised her to contact the office if her condition worsens, changes or fails to improve as anticipated. She expressed understanding with the diagnosis(es) and plan.       Channah Menor, who was evaluated through a patient-initiated, synchronous (real-time) audio-video encounter, and/or her healthcare decision maker, is aware that it is a billable service, with coverage as determined by her insurance carrier. She provided verbal consent to proceed: Yes, and patient identification was verified. It was conducted pursuant to the emergency declaration under the and the , 1135 waiver authority and the and Act. A caregiver was  present when appropriate. Ability to conduct physical exam was limited. I was at home. The patient was at home.      , NP

## 2021-09-05 ENCOUNTER — Encounter

## 2021-09-05 NOTE — Telephone Encounter (Signed)
This patient contacted office for the following prescriptions to be filled:    Last office visit: 11/04/20  Follow up appointment: 10/11/20  Medication requested :   Requested Prescriptions     Pending Prescriptions Disp Refills    apixaban (ELIQUIS) 5 mg tablet 180 Tablet 1     Sig: Take 1 Tablet by mouth two (2) times a day.       PCP: Silvio Pate (Former [facility])  Mail order or Local pharmacy name [facility]

## 2021-09-07 MED ORDER — APIXABAN 5 MG TABLET
5 mg | ORAL_TABLET | Freq: Two times a day (BID) | ORAL | 1 refills | Status: DC
Start: 2021-09-07 — End: 2021-10-11

## 2021-09-12 ENCOUNTER — Encounter

## 2021-09-12 NOTE — Telephone Encounter (Signed)
Requested Prescriptions     Pending Prescriptions Disp Refills    apixaban (ELIQUIS) 5 mg tablet 180 Tablet 1     Sig: Take 1 Tablet by mouth two (2) times a day.     APPOINTMENT 10/11/21

## 2021-10-11 ENCOUNTER — Ambulatory Visit: Attending: Internal Medicine | Primary: Family

## 2021-10-11 DIAGNOSIS — I10 Essential (primary) hypertension: Secondary | ICD-10-CM

## 2021-10-11 MED ORDER — VERAPAMIL ER 240 MG 24 HR CAP
240 mg | ORAL_CAPSULE | Freq: Every day | ORAL | 1 refills | Status: AC
Start: 2021-10-11 — End: ?

## 2021-10-11 MED ORDER — METOPROLOL TARTRATE 50 MG TAB
50 mg | ORAL_TABLET | Freq: Two times a day (BID) | ORAL | 1 refills | Status: AC
Start: 2021-10-11 — End: ?

## 2021-10-11 MED ORDER — DONEPEZIL 5 MG TAB
5 mg | ORAL_TABLET | Freq: Every evening | ORAL | 1 refills | Status: AC
Start: 2021-10-11 — End: ?

## 2021-10-11 MED ORDER — METOPROLOL TARTRATE 25 MG TAB
25 mg | ORAL_TABLET | Freq: Two times a day (BID) | ORAL | 1 refills | Status: AC
Start: 2021-10-11 — End: ?

## 2021-10-11 MED ORDER — APIXABAN 5 MG TABLET
5 mg | ORAL_TABLET | Freq: Two times a day (BID) | ORAL | 1 refills | Status: AC
Start: 2021-10-11 — End: ?

## 2021-10-11 NOTE — Progress Notes (Signed)
Emily Henson is a 87 y.o. female presenting today for Establish Care (Concerns for hearing and memory. Forgetfulness and constant repeating. Unable to stand on feet for periods of time, pain in bilateral legs. Swelling in hands and feet. )  .     Chief Complaint   Patient presents with    Establish Care     Concerns for hearing and memory. Forgetfulness and constant repeating. Unable to stand on feet for periods of time, pain in bilateral legs. Swelling in hands and feet.        HPI:  Emily Henson presents to the office today to establish care.     Patient has a PMHx of HTN, atrial fibrillation, SSS s/p PPM, blindness, hypertriglyceridemia, dementia.    HTN: Patient is on metoprolol, HCTZ and verapamil.    Atrial fibrillation: She has a diagnosis of SSS s/p pacemaker.  Currently on Eliquis, Lopressor and verapamil.  Follows with cardiology.  Is due for her yearly appointment.    Hypertriglyceridemia: Lipid panel in 1/21 showed LDL 80, HDL 48, triglyceride 148.    Memory loss: Reports forgetting things, has to frequently repeat things, worse in the last 6 months. Patient is donepezil.     Review of Systems   Constitutional:  Negative for chills, diaphoresis, fever, malaise/fatigue and weight loss.   HENT:  Negative for congestion, ear discharge, ear pain, hearing loss, nosebleeds, sinus pain, sore throat and tinnitus.    Eyes:  Negative for blurred vision, double vision and photophobia.   Respiratory:  Negative for cough, sputum production, shortness of breath, wheezing and stridor.    Cardiovascular:  Positive for leg swelling. Negative for chest pain, palpitations, orthopnea and claudication.   Gastrointestinal:  Negative for abdominal pain, constipation, diarrhea, heartburn, nausea and vomiting.   Genitourinary:  Negative for dysuria, flank pain, frequency, hematuria and urgency.   Musculoskeletal:  Positive for joint pain. Negative for back pain, myalgias and neck pain.   Skin:  Negative for rash.    Neurological:  Negative for tingling, tremors, sensory change, speech change, focal weakness, seizures, weakness and headaches.   Psychiatric/Behavioral:  Positive for memory loss. Negative for depression. The patient is not nervous/anxious.    All other systems reviewed and are negative.    Allergies   Allergen Reactions    Other Food Rash     "Preservatives"    Caffeine Palpitations       PHQ Screening   3 most recent PHQ Screens 10/11/2021   Little interest or pleasure in doing things Not at all   Feeling down, depressed, irritable, or hopeless Not at all   Total Score PHQ 2 0       History  Past Medical History:   Diagnosis Date    Arrhythmia     Atrial Fibrillation    Arthritis     CAD (coronary artery disease)     Hypercholesterolemia     hypertriglycerides    Hypertension        Past Surgical History:   Procedure Laterality Date    HX APPENDECTOMY      HX BREAST REDUCTION  1990    HX CHOLECYSTECTOMY      HX COLONOSCOPY      HX GYN      hysterectomy    HX PACEMAKER      HX TOTAL COLECTOMY      HX TOTAL COLECTOMY         Social History     Socioeconomic History  Marital status: MARRIED     Spouse name: Not on file    Number of children: Not on file    Years of education: Not on file    Highest education level: Not on file   Occupational History    Not on file   Tobacco Use    Smoking status: Former    Smokeless tobacco: Never   Substance and Sexual Activity    Alcohol use: No    Drug use: No    Sexual activity: Never     Partners: Male   Other Topics Concern    Not on file   Social History Narrative    Not on file     Social Determinants of Health     Financial Resource Strain: Not on file   Food Insecurity: Not on file   Transportation Needs: Not on file   Physical Activity: Not on file   Stress: Not on file   Social Connections: Not on file   Intimate Partner Violence: Not on file   Housing Stability: Not on file       Current Outpatient Medications   Medication Sig Dispense Refill    donepeziL (ARICEPT)  5 mg tablet Take 1 Tablet by mouth nightly. 90 Tablet 1    verapamil ER (VERELAN) 240 mg ER capsule Take 1 Capsule by mouth daily. 90 Capsule 1    metoprolol tartrate (LOPRESSOR) 25 mg tablet Take 1 Tablet by mouth two (2) times a day. 180 Tablet 1    metoprolol tartrate (LOPRESSOR) 50 mg tablet Take 1 Tablet by mouth two (2) times a day. 180 Tablet 1    apixaban (ELIQUIS) 5 mg tablet Take 1 Tablet by mouth two (2) times a day. 180 Tablet 1    hydroCHLOROthiazide (HYDRODIURIL) 12.5 mg tablet Take 1 Tablet by mouth daily. 180 Tablet 1    meclizine (ANTIVERT) 25 mg tablet Take 1 Tablet by mouth every six (6) hours as needed for Dizziness. 120 Tablet 1    fenofibrate nanocrystallized (TRICOR) 145 mg tablet Take 1 Tablet by mouth daily. 180 Tablet 1    cyanocobalamin (Vitamin B-12) 1,000 mcg tablet Take 1 Tablet by mouth daily. 90 Tablet 1    cholecalciferol, vitamin D3, (VITAMIN D3) 2,000 unit tab Take 1 Tab by mouth daily. 30 Tab 2    cinnamon bark (CINNAMON PO) Take  by mouth. (Patient not taking: Reported on 10/11/2021)           Vitals:    10/11/21 1328   BP: (!) 157/105   Pulse: 86   Resp: 14   SpO2: 97%   Weight: 183 lb (83 kg)   Height: 5\' 5"  (1.651 m)   PainSc:   0 - No pain       Physical Exam  Vitals and nursing note reviewed.   Constitutional:       General: She is not in acute distress.     Appearance: Normal appearance. She is not ill-appearing, toxic-appearing or diaphoretic.   HENT:      Head: Normocephalic and atraumatic.   Cardiovascular:      Rate and Rhythm: Normal rate. Rhythm irregular.      Pulses: Normal pulses.      Heart sounds: No murmur heard.  Pulmonary:      Effort: Pulmonary effort is normal. No respiratory distress.      Breath sounds: Normal breath sounds. No wheezing or rales.   Musculoskeletal:  General: Deformity present. Normal range of motion.      Cervical back: Normal range of motion.      Right lower leg: No edema.      Left lower leg: No edema.      Comments: Arthritic  changes in both hands.   Skin:     General: Skin is warm and dry.      Coloration: Skin is not jaundiced or pale.   Neurological:      General: No focal deficit present.      Mental Status: She is alert. Mental status is at baseline.      Cranial Nerves: No cranial nerve deficit.   Psychiatric:         Mood and Affect: Mood normal.         Behavior: Behavior normal.         Thought Content: Thought content normal.         Judgment: Judgment normal.       No visits with results within 3 Month(s) from this visit.   Latest known visit with results is:   Office Visit on 02/04/2020   Component Date Value Ref Range Status    Color (UA POC) 02/04/2020 Yellow   Final    Clarity (UA POC) 02/04/2020 Cloudy   Final    Glucose (UA POC) 02/04/2020 Negative  Negative Final    Bilirubin (UA POC) 02/04/2020 Negative  Negative Final    Ketones (UA POC) 02/04/2020 Negative  Negative Final    Specific gravity (UA POC) 02/04/2020 1.025  1.001 - 1.035 Final    Blood (UA POC) 02/04/2020 2+  Negative Final    pH (UA POC) 02/04/2020 5.0  4.6 - 8.0 Final    Protein (UA POC) 02/04/2020 1+  Negative Final    Urobilinogen (UA POC) 02/04/2020 0.2 mg/dL  0.2 - 1 Final    Nitrites (UA POC) 02/04/2020 Negative  Negative Final    Leukocyte esterase (UA POC) 02/04/2020 3+  Negative Final       No results found for any visits on 10/11/21.    Patient Care Team:  Patient Care Team:  Paula Libra, NP as PCP - General (Nurse Practitioner)      Assessment / Plan:      ICD-10-CM ICD-9-CM    1. Essential hypertension  I10 401.9       2. Memory loss  R41.3 780.93 donepeziL (ARICEPT) 5 mg tablet      TSH 3RD GENERATION      VITAMIN B12 & FOLATE      3. Hypertension, unspecified type  I10 401.9 verapamil ER (VERELAN) 240 mg ER capsule      metoprolol tartrate (LOPRESSOR) 25 mg tablet      metoprolol tartrate (LOPRESSOR) 50 mg tablet      HEMOGLOBIN A1C WITH EAG      4. Atrial fibrillation, unspecified type (HCC)  I48.91 427.31 apixaban (ELIQUIS) 5 mg  tablet      METABOLIC PANEL, COMPREHENSIVE      CBC WITH AUTOMATED DIFF      5. Needs flu shot  Z23 V04.81 INFLUENZA, FLUAD, (AGE 79 Y+), IM, PF, 0.5 ML      6. Hypertriglyceridemia  E78.1 272.1 HEMOGLOBIN A1C WITH EAG      LIPID PANEL      7. Blind in both eyes  H54.3 369.00       8. Personal history of other endocrine, nutritional and metabolic disease   Z86.39 V12.29 HEMOGLOBIN  A1C WITH EAG      9. Encounter for medical examination to establish care  Z00.00 V70.9         HTN: BP is elevated today.  Recheck at next visit.    Hypertriglyceridemia: Check lipid panel and HbA1c.    Memory loss: Patient has a family history of dementia.  Check vitamin B12, folate and TSH.  Continue donepezil.    Atrial fibrillation: Following with cardiology.  Continue Eliquis.    Labs ordered.    Received flu shot.    Follow-up and Dispositions    Return in about 3 months (around 01/11/2022) for Medicare Wellness, 30 mins.         I asked the patient if she  had any questions and answered her  questions.  The patient stated that she understands the treatment plan and agrees with the treatment plan    This document was created with a voice activated dictation system and may contain transcription errors.

## 2021-12-06 ENCOUNTER — Encounter

## 2021-12-06 NOTE — Telephone Encounter (Signed)
Requested Prescriptions     Pending Prescriptions Disp Refills    fenofibrate nanocrystallized (TRICOR) 145 mg tablet 180 Tablet 1     Sig: Take 1 Tablet by mouth daily.

## 2021-12-07 MED ORDER — FENOFIBRATE NANOCRYSTALLIZED 145 MG TAB
145 mg | ORAL_TABLET | Freq: Every day | ORAL | 1 refills | Status: AC
Start: 2021-12-07 — End: ?

## 2022-01-03 ENCOUNTER — Ambulatory Visit: Admit: 2022-01-03 | Discharge: 2022-01-03 | Payer: MEDICARE | Primary: Internal Medicine

## 2022-01-03 ENCOUNTER — Inpatient Hospital Stay: Admit: 2022-01-03 | Payer: MEDICARE | Primary: Internal Medicine

## 2022-01-03 DIAGNOSIS — I10 Essential (primary) hypertension: Secondary | ICD-10-CM

## 2022-01-04 ENCOUNTER — Encounter: Primary: Internal Medicine

## 2022-01-04 LAB — CBC WITH AUTOMATED DIFF
ABS. BASOPHILS: 0 10*3/uL (ref 0.0–0.1)
ABS. EOSINOPHILS: 0 10*3/uL (ref 0.0–0.4)
ABS. IMM. GRANS.: 0.1 10*3/uL — ABNORMAL HIGH (ref 0.00–0.04)
ABS. LYMPHOCYTES: 1.9 10*3/uL (ref 0.9–3.6)
ABS. MONOCYTES: 0.6 10*3/uL (ref 0.05–1.2)
ABS. NEUTROPHILS: 4.6 10*3/uL (ref 1.8–8.0)
ABSOLUTE NRBC: 0 10*3/uL (ref 0.00–0.01)
BASOPHILS: 0 % (ref 0–2)
EOSINOPHILS: 1 % (ref 0–5)
HCT: 46.7 % — ABNORMAL HIGH (ref 35.0–45.0)
HGB: 14.8 g/dL (ref 12.0–16.0)
IMMATURE GRANULOCYTES: 1 % — ABNORMAL HIGH (ref 0.0–0.5)
LYMPHOCYTES: 27 % (ref 21–52)
MCH: 30.9 PG (ref 24.0–34.0)
MCHC: 31.7 g/dL (ref 31.0–37.0)
MCV: 97.5 FL (ref 78.0–100.0)
MONOCYTES: 8 % (ref 3–10)
MPV: 10.9 FL (ref 9.2–11.8)
NEUTROPHILS: 64 % (ref 40–73)
NRBC: 0 PER 100 WBC
PLATELET: 256 10*3/uL (ref 135–420)
RBC: 4.79 M/uL (ref 4.20–5.30)
RDW: 13.8 % (ref 11.6–14.5)
WBC: 7.2 10*3/uL (ref 4.6–13.2)

## 2022-01-04 LAB — LIPID PANEL
CHOL/HDL Ratio: 4.1 (ref 0–5.0)
Chol/HDL Ratio: 4.1 (ref 0–5.0)
Cholesterol, Total: 195 MG/DL (ref <200)
Cholesterol, total: 195 MG/DL (ref <200)
HDL Cholesterol: 48 MG/DL (ref 40–60)
HDL: 48 MG/DL (ref 40–60)
LDL Calculated: 104 MG/DL — ABNORMAL HIGH (ref 0–100)
LDL, calculated: 104 MG/DL — ABNORMAL HIGH (ref 0–100)
Triglyceride: 215 MG/DL — ABNORMAL HIGH (ref <150)
Triglycerides: 215 MG/DL — ABNORMAL HIGH (ref <150)
VLDL Cholesterol Calculated: 43 MG/DL
VLDL, calculated: 43 MG/DL

## 2022-01-04 LAB — METABOLIC PANEL, COMPREHENSIVE
A-G Ratio: 1 (ref 0.8–1.7)
ALT (SGPT): 25 U/L (ref 13–56)
AST (SGOT): 14 U/L (ref 10–38)
Albumin: 3.5 g/dL (ref 3.4–5.0)
Alk. phosphatase: 64 U/L (ref 45–117)
Anion gap: 7 mmol/L (ref 3.0–18)
BUN/Creatinine ratio: 13 (ref 12–20)
BUN: 9 MG/DL (ref 7.0–18)
Bilirubin, total: 0.8 MG/DL (ref 0.2–1.0)
CO2: 26 mmol/L (ref 21–32)
Calcium: 9.9 MG/DL (ref 8.5–10.1)
Chloride: 107 mmol/L (ref 100–111)
Creatinine: 0.72 MG/DL (ref 0.6–1.3)
Globulin: 3.6 g/dL (ref 2.0–4.0)
Glucose: 92 mg/dL (ref 74–99)
Potassium: 3.9 mmol/L (ref 3.5–5.5)
Protein, total: 7.1 g/dL (ref 6.4–8.2)
Sodium: 140 mmol/L (ref 136–145)
eGFR: >60 mL/min/{1.73_m2} (ref >60)

## 2022-01-04 LAB — VITAMIN B12 & FOLATE
Folate: 7.2 ng/mL (ref 3.10–17.50)
Folate: 7.2 ng/mL (ref 3.10–17.50)
Vitamin B-12: 1958 pg/mL — ABNORMAL HIGH (ref 211–911)
Vitamin B12: 1958 pg/mL — ABNORMAL HIGH (ref 211–911)

## 2022-01-04 LAB — TSH 3RD GENERATION
TSH: 1.74 u[IU]/mL (ref 0.36–3.74)
TSH: 1.74 u[IU]/mL (ref 0.36–3.74)

## 2022-01-04 LAB — HEMOGLOBIN A1C WITH EAG
Est. average glucose: 114 mg/dL
Hemoglobin A1c: 5.6 % (ref 4.2–5.6)

## 2022-01-04 LAB — CBC WITH AUTO DIFFERENTIAL
Basophils %: 0 % (ref 0–2)
Basophils Absolute: 0 10*3/uL (ref 0.0–0.1)
Eosinophils %: 1 % (ref 0–5)
Eosinophils Absolute: 0 10*3/uL (ref 0.0–0.4)
Granulocyte Absolute Count: 0.1 10*3/uL — ABNORMAL HIGH (ref 0.00–0.04)
Hematocrit: 46.7 % — ABNORMAL HIGH (ref 35.0–45.0)
Hemoglobin: 14.8 g/dL (ref 12.0–16.0)
Immature Granulocytes: 1 % — ABNORMAL HIGH (ref 0.0–0.5)
Lymphocytes %: 27 % (ref 21–52)
Lymphocytes Absolute: 1.9 10*3/uL (ref 0.9–3.6)
MCH: 30.9 PG (ref 24.0–34.0)
MCHC: 31.7 g/dL (ref 31.0–37.0)
MCV: 97.5 FL (ref 78.0–100.0)
MPV: 10.9 FL (ref 9.2–11.8)
Monocytes %: 8 % (ref 3–10)
Monocytes Absolute: 0.6 10*3/uL (ref 0.05–1.2)
NRBC Absolute: 0 10*3/uL (ref 0.00–0.01)
Neutrophils %: 64 % (ref 40–73)
Neutrophils Absolute: 4.6 10*3/uL (ref 1.8–8.0)
Nucleated RBCs: 0 PER 100 WBC
Platelets: 256 10*3/uL (ref 135–420)
RBC: 4.79 M/uL (ref 4.20–5.30)
RDW: 13.8 % (ref 11.6–14.5)
WBC: 7.2 10*3/uL (ref 4.6–13.2)

## 2022-01-04 LAB — COMPREHENSIVE METABOLIC PANEL
ALT: 25 U/L (ref 13–56)
AST: 14 U/L (ref 10–38)
Albumin/Globulin Ratio: 1 (ref 0.8–1.7)
Albumin: 3.5 g/dL (ref 3.4–5.0)
Alkaline Phosphatase: 64 U/L (ref 45–117)
Anion Gap: 7 mmol/L (ref 3.0–18)
BUN: 9 MG/DL (ref 7.0–18)
Bun/Cre Ratio: 13 (ref 12–20)
CO2: 26 mmol/L (ref 21–32)
Calcium: 9.9 MG/DL (ref 8.5–10.1)
Chloride: 107 mmol/L (ref 100–111)
Creatinine: 0.72 MG/DL (ref 0.6–1.3)
ESTIMATED GLOMERULAR FILTRATION RATE: >60 mL/min/{1.73_m2} (ref >60)
Globulin: 3.6 g/dL (ref 2.0–4.0)
Glucose: 92 mg/dL (ref 74–99)
Potassium: 3.9 mmol/L (ref 3.5–5.5)
Sodium: 140 mmol/L (ref 136–145)
Total Bilirubin: 0.8 MG/DL (ref 0.2–1.0)
Total Protein: 7.1 g/dL (ref 6.4–8.2)

## 2022-01-04 LAB — HEMOGLOBIN A1C W/EAG
Hemoglobin A1C: 5.6 % (ref 4.2–5.6)
eAG: 114 mg/dL

## 2022-01-11 ENCOUNTER — Ambulatory Visit: Admit: 2022-01-11 | Discharge: 2022-01-11 | Payer: MEDICARE | Attending: Internal Medicine | Primary: Internal Medicine

## 2022-01-11 ENCOUNTER — Encounter

## 2022-01-11 DIAGNOSIS — Z0000 Encounter for general adult medical examination without abnormal findings: Secondary | ICD-10-CM

## 2022-01-11 NOTE — Progress Notes (Signed)
Emily Henson is a 88 y.o. female presenting today for Follow Up Chronic Condition (Fatigue with minimal exertion. Body aches, knee and back. Unable to stand for long periods. )  .     Chief Complaint   Patient presents with    Follow Up Chronic Condition     Fatigue with minimal exertion. Body aches, knee and back. Unable to stand for long periods.        HPI:  Emily Henson presents to the office today for follow up.     Patient has a PMHx of HTN, atrial fibrillation, SSS s/p PPM, blindness, hypertriglyceridemia, dementia.    HTN: Patient is on metoprolol, HCTZ and verapamil.    Atrial fibrillation: She has a diagnosis of SSS s/p pacemaker.  Currently on Eliquis, Lopressor and verapamil.  Follows with cardiology.     Hypertriglyceridemia: Lipid panel in 1/23 showed triglyceride 215, LDL 104.  HbA1c was 5.6%.    Memory loss: Reports forgetting things, has to frequently repeat things, worse in the last 6 months. Patient is donepezil.  Vitamin B12, folate, TSH were all normal.    Today, patient is complaining of generalized body pains and back pain.    Review of Systems   Constitutional:  Negative for chills, diaphoresis, fever, malaise/fatigue and weight loss.   HENT:  Negative for congestion, ear discharge, ear pain, hearing loss, nosebleeds, sinus pain, sore throat and tinnitus.    Eyes:  Negative for blurred vision, double vision and photophobia.   Respiratory:  Negative for cough, sputum production, shortness of breath, wheezing and stridor.    Cardiovascular:  Positive for leg swelling. Negative for chest pain, palpitations, orthopnea and claudication.   Gastrointestinal:  Negative for abdominal pain, constipation, diarrhea, heartburn, nausea and vomiting.   Genitourinary:  Negative for dysuria, flank pain, frequency, hematuria and urgency.   Musculoskeletal:  Positive for back pain and joint pain. Negative for myalgias and neck pain.   Skin:  Negative for rash.   Neurological:  Negative for tingling,  tremors, sensory change, speech change, focal weakness, seizures, weakness and headaches.   Psychiatric/Behavioral:  Positive for memory loss. Negative for depression. The patient is not nervous/anxious.    All other systems reviewed and are negative.    Allergies   Allergen Reactions    Other Food Rash     "Preservatives"    Caffeine Palpitations       PHQ Screening   3 most recent PHQ Screens 01/11/2022   Little interest or pleasure in doing things Not at all   Feeling down, depressed, irritable, or hopeless Not at all   Total Score PHQ 2 0       History  Past Medical History:   Diagnosis Date    Arrhythmia     Atrial Fibrillation    Arthritis     CAD (coronary artery disease)     Hypercholesterolemia     hypertriglycerides    Hypertension        Past Surgical History:   Procedure Laterality Date    HX APPENDECTOMY      HX BREAST REDUCTION  1990    HX CHOLECYSTECTOMY      HX COLONOSCOPY      HX GYN      hysterectomy    HX PACEMAKER      HX TOTAL COLECTOMY      HX TOTAL COLECTOMY         Social History     Socioeconomic History    Marital  status: MARRIED     Spouse name: Not on file    Number of children: Not on file    Years of education: Not on file    Highest education level: Not on file   Occupational History    Not on file   Tobacco Use    Smoking status: Former    Smokeless tobacco: Never   Substance and Sexual Activity    Alcohol use: No    Drug use: No    Sexual activity: Never     Partners: Male   Other Topics Concern    Not on file   Social History Narrative    Not on file     Social Determinants of Health     Financial Resource Strain: Not on file   Food Insecurity: Not on file   Transportation Needs: Not on file   Physical Activity: Not on file   Stress: Not on file   Social Connections: Not on file   Intimate Partner Violence: Not on file   Housing Stability: Not on file       Current Outpatient Medications   Medication Sig Dispense Refill    calcium carbonate (CALTREX) 600 mg calcium (1,500 mg) tablet  Take 1 Tablet by mouth daily.      fenofibrate nanocrystallized (TRICOR) 145 mg tablet Take 1 Tablet by mouth daily. 180 Tablet 1    donepeziL (ARICEPT) 5 mg tablet Take 1 Tablet by mouth nightly. 90 Tablet 1    verapamil ER (VERELAN) 240 mg ER capsule Take 1 Capsule by mouth daily. 90 Capsule 1    metoprolol tartrate (LOPRESSOR) 25 mg tablet Take 1 Tablet by mouth two (2) times a day. 180 Tablet 1    metoprolol tartrate (LOPRESSOR) 50 mg tablet Take 1 Tablet by mouth two (2) times a day. 180 Tablet 1    apixaban (ELIQUIS) 5 mg tablet Take 1 Tablet by mouth two (2) times a day. 180 Tablet 1    hydroCHLOROthiazide (HYDRODIURIL) 12.5 mg tablet Take 1 Tablet by mouth daily. 180 Tablet 1    cyanocobalamin (Vitamin B-12) 1,000 mcg tablet Take 1 Tablet by mouth daily. 90 Tablet 1    cholecalciferol, vitamin D3, (VITAMIN D3) 2,000 unit tab Take 1 Tab by mouth daily. 30 Tab 2         Vitals:    01/11/22 1305   BP: 139/87   Pulse: 93   Resp: 14   SpO2: 96%   Weight: 186 lb 6.4 oz (84.6 kg)   Height: 5' 5"  (1.651 m)   PainSc:   0 - No pain       Physical Exam  Vitals and nursing note reviewed.   Constitutional:       General: She is not in acute distress.     Appearance: Normal appearance. She is not ill-appearing, toxic-appearing or diaphoretic.   HENT:      Head: Normocephalic and atraumatic.   Eyes:      Comments: blind   Cardiovascular:      Rate and Rhythm: Normal rate. Rhythm irregular.      Pulses: Normal pulses.      Heart sounds: No murmur heard.  Pulmonary:      Effort: Pulmonary effort is normal. No respiratory distress.      Breath sounds: Normal breath sounds. No wheezing or rales.   Musculoskeletal:         General: Deformity present. Normal range of motion.      Cervical  back: Normal range of motion.      Right lower leg: No edema.      Left lower leg: No edema.      Comments: Arthritic changes in both hands.   Skin:     General: Skin is warm and dry.      Coloration: Skin is not jaundiced or pale.    Neurological:      General: No focal deficit present.      Mental Status: She is alert. Mental status is at baseline.      Cranial Nerves: No cranial nerve deficit.   Psychiatric:         Mood and Affect: Mood normal.         Behavior: Behavior normal.         Thought Content: Thought content normal.         Judgment: Judgment normal.       Hospital Outpatient Visit on 01/03/2022   Component Date Value Ref Range Status    Hemoglobin A1c 01/03/2022 5.6  4.2 - 5.6 % Final    Comment: (NOTE)  HbA1C Interpretive Ranges  <5.7              Normal  5.7 - 6.4         Consider Prediabetes  >6.5              Consider Diabetes      Est. average glucose 01/03/2022 114  mg/dL Final    Comment: (NOTE)  The eAG should be interpreted with patient characteristics in mind   since ethnicity, interindividual differences, red cell lifespan,   variation in rates of glycation, etc. may affect the validity of the   calculation.      LIPID PROFILE 01/03/2022        Final    Cholesterol, total 01/03/2022 195  <200 MG/DL Final    Triglyceride 01/03/2022 215 (A)  <150 MG/DL Final    Comment: The drugs N-acetylcysteine (NAC) and  Metamiszole have been found to cause falsely  low results in this chemical assay. Please  be sure to submit blood samples obtained  BEFORE administration of either of these  drugs to assure correct results.      HDL Cholesterol 01/03/2022 48  40 - 60 MG/DL Final    LDL, calculated 01/03/2022 104 (A)  0 - 100 MG/DL Final    VLDL, calculated 01/03/2022 43  MG/DL Final    CHOL/HDL Ratio 01/03/2022 4.1  0 - 5.0   Final    Sodium 01/03/2022 140  136 - 145 mmol/L Final    Potassium 01/03/2022 3.9  3.5 - 5.5 mmol/L Final    Chloride 01/03/2022 107  100 - 111 mmol/L Final    CO2 01/03/2022 26  21 - 32 mmol/L Final    Anion gap 01/03/2022 7  3.0 - 18 mmol/L Final    Glucose 01/03/2022 92  74 - 99 mg/dL Final    BUN 01/03/2022 9  7.0 - 18 MG/DL Final    Creatinine 01/03/2022 0.72  0.6 - 1.3 MG/DL Final    BUN/Creatinine ratio  01/03/2022 13  12 - 20   Final    eGFR 01/03/2022 >60  >60 ml/min/1.3m Final    Comment:      Pediatric calculator link: https://www.kidney.org/professionals/kdoqi/gfr_calculatorped       These results are not intended for use in patients <162years of age.       eGFR results are calculated without a race factor  using  the 2021 CKD-EPI equation. Careful clinical correlation is recommended, particularly when comparing to results calculated using previous equations.  The CKD-EPI equation is less accurate in patients with extremes of muscle mass, extra-renal metabolism of creatinine, excessive creatine ingestion, or following therapy that affects renal tubular secretion.      Calcium 01/03/2022 9.9  8.5 - 10.1 MG/DL Final    Bilirubin, total 01/03/2022 0.8  0.2 - 1.0 MG/DL Final    ALT (SGPT) 01/03/2022 25  13 - 56 U/L Final    AST (SGOT) 01/03/2022 14  10 - 38 U/L Final    Alk. phosphatase 01/03/2022 64  45 - 117 U/L Final    Protein, total 01/03/2022 7.1  6.4 - 8.2 g/dL Final    Albumin 01/03/2022 3.5  3.4 - 5.0 g/dL Final    Globulin 01/03/2022 3.6  2.0 - 4.0 g/dL Final    A-G Ratio 01/03/2022 1.0  0.8 - 1.7   Final    WBC 01/03/2022 7.2  4.6 - 13.2 K/uL Final    RBC 01/03/2022 4.79  4.20 - 5.30 M/uL Final    HGB 01/03/2022 14.8  12.0 - 16.0 g/dL Final    HCT 01/03/2022 46.7 (A)  35.0 - 45.0 % Final    MCV 01/03/2022 97.5  78.0 - 100.0 FL Final    MCH 01/03/2022 30.9  24.0 - 34.0 PG Final    MCHC 01/03/2022 31.7  31.0 - 37.0 g/dL Final    RDW 01/03/2022 13.8  11.6 - 14.5 % Final    PLATELET 01/03/2022 256  135 - 420 K/uL Final    MPV 01/03/2022 10.9  9.2 - 11.8 FL Final    NRBC 01/03/2022 0.0  0 PER 100 WBC Final    ABSOLUTE NRBC 01/03/2022 0.00  0.00 - 0.01 K/uL Final    NEUTROPHILS 01/03/2022 64  40 - 73 % Final    LYMPHOCYTES 01/03/2022 27  21 - 52 % Final    MONOCYTES 01/03/2022 8  3 - 10 % Final    EOSINOPHILS 01/03/2022 1  0 - 5 % Final    BASOPHILS 01/03/2022 0  0 - 2 % Final    IMMATURE GRANULOCYTES  01/03/2022 1 (A)  0.0 - 0.5 % Final    ABS. NEUTROPHILS 01/03/2022 4.6  1.8 - 8.0 K/UL Final    ABS. LYMPHOCYTES 01/03/2022 1.9  0.9 - 3.6 K/UL Final    ABS. MONOCYTES 01/03/2022 0.6  0.05 - 1.2 K/UL Final    ABS. EOSINOPHILS 01/03/2022 0.0  0.0 - 0.4 K/UL Final    ABS. BASOPHILS 01/03/2022 0.0  0.0 - 0.1 K/UL Final    ABS. IMM. GRANS. 01/03/2022 0.1 (A)  0.00 - 0.04 K/UL Final    DF 01/03/2022 AUTOMATED    Final    TSH 01/03/2022 1.74  0.36 - 3.74 uIU/mL Final    Vitamin B12 01/03/2022 1,958 (A)  211 - 911 pg/mL Final    Folate 01/03/2022 7.2  3.10 - 17.50 ng/mL Final       No results found for any visits on 01/11/22.    Patient Care Team:  Patient Care Team:  Kizzie Fantasia, MD as PCP - General (Internal Medicine Physician)  Kizzie Fantasia, MD as PCP - [facility] Empaneled Provider      Assessment / Plan:      ICD-10-CM ICD-9-CM    1. Essential hypertension  I10 401.9       2. Atrial fibrillation, unspecified type ([facility])  I48.91  427.31       3. Hypertriglyceridemia  E78.1 272.1       4. Memory loss  R41.3 780.93       5. Blind in both eyes  H54.3 369.00       6. Primary osteoarthritis involving multiple joints  M15.9 715.98         Labs reviewed and discussed with patient.    HTN: BP acceptable.     Hypertriglyceridemia: Continue fenofibrate.     Memory loss: Likely has dementia. Continue donepezil, Lopressor and HCTZ.    Atrial fibrillation: Following with cardiology.  Continue Eliquis, Lopressor 75 mg every day.  Has appointment with cardiology next month.    Osteoarthritis of multiple joints: Patient advised to take Tylenol arthritis.              I asked the patient if she  had any questions and answered her  questions.  The patient stated that she understands the treatment plan and agrees with the treatment plan    This document was created with a voice activated dictation system and may contain transcription errors.

## 2022-01-11 NOTE — Progress Notes (Signed)
This is the Subsequent Medicare Annual Wellness Exam, performed 12 months or more after the Initial AWV or the last Subsequent AWV    I have reviewed the patient's medical history in detail and updated the computerized patient record.       Assessment/Plan   Education and counseling provided:  Are appropriate based on today's review and evaluation  End-of-Life planning (with patient's consent)    1. Medicare annual wellness visit, subsequent  2. Atrial fibrillation, unspecified type (HCC)  3. Hypertriglyceridemia  4. Memory loss  5. Essential hypertension  6. Blind in both eyes  7. Primary osteoarthritis involving multiple joints     Discussed advance care planning.  Patient and her son provided with forms to review and complete at home.    Depression Risk Factor Screening     3 most recent PHQ Screens 01/11/2022   Little interest or pleasure in doing things Not at all   Feeling down, depressed, irritable, or hopeless Not at all   Total Score PHQ 2 0       Alcohol & Drug Abuse Risk Screen    Do you average more than 1 drink per night or more than 7 drinks a week:  No    On any one occasion in the past three months have you have had more than 3 drinks containing alcohol:  No          Functional Ability and Level of Safety    Hearing: Hearing is good.      Activities of Daily Living:  The home contains: handrails and grab bars  Patient needs help with:  transportation, shopping, preparing meals, laundry, housework, managing medications, managing money, dressing, bathing, hygiene, bathroom needs, and walking      Ambulation: with difficulty, uses a cane     Fall Risk:  Fall Risk Assessment, last 12 mths 01/11/2022   Able to walk? Yes   Fall in past 12 months? 0   Do you feel unsteady? 0   Are you worried about falling 1   Is TUG test greater than 12 seconds? 0   Is the gait abnormal? 1   Number of falls in past 12 months 0   Fall with injury? -      Abuse Screen:  Patient is not abused       Cognitive Screening    Has  your family/caregiver stated any concerns about your memory: yes.      Cognitive Screening: Abnormal - word recall 2/3    Health Maintenance Due     Health Maintenance Due   Topic Date Due    Shingles Vaccine (1 of 2) Never done    Medicare Yearly Exam  11/27/2020    COVID-19 Vaccine (2 - Pfizer series) 02/02/2021       Patient Care Team   Patient Care Team:  Emily Gustin, MD as PCP - General (Internal Medicine Physician)  Emily Gustin, MD as PCP - [facility] Empaneled Provider    History     Patient Active Problem List   Diagnosis Code    Blind in both eyes H54.3    Atrial fibrillation [facility]) I48.91    Pacemaker Z95.0    Hypertension I10    Hypertriglyceridemia E78.1     Past Medical History:   Diagnosis Date    Arrhythmia     Atrial Fibrillation    Arthritis     CAD (coronary artery disease)     Hypercholesterolemia  hypertriglycerides    Hypertension       Past Surgical History:   Procedure Laterality Date    HX APPENDECTOMY      HX BREAST REDUCTION  1990    HX CHOLECYSTECTOMY      HX COLONOSCOPY      HX GYN      hysterectomy    HX PACEMAKER      HX TOTAL COLECTOMY      HX TOTAL COLECTOMY       Current Outpatient Medications   Medication Sig Dispense Refill    calcium carbonate (CALTREX) 600 mg calcium (1,500 mg) tablet Take 1 Tablet by mouth daily.      fenofibrate nanocrystallized (TRICOR) 145 mg tablet Take 1 Tablet by mouth daily. 180 Tablet 1    donepeziL (ARICEPT) 5 mg tablet Take 1 Tablet by mouth nightly. 90 Tablet 1    verapamil ER (VERELAN) 240 mg ER capsule Take 1 Capsule by mouth daily. 90 Capsule 1    metoprolol tartrate (LOPRESSOR) 25 mg tablet Take 1 Tablet by mouth two (2) times a day. 180 Tablet 1    metoprolol tartrate (LOPRESSOR) 50 mg tablet Take 1 Tablet by mouth two (2) times a day. 180 Tablet 1    apixaban (ELIQUIS) 5 mg tablet Take 1 Tablet by mouth two (2) times a day. 180 Tablet 1    hydroCHLOROthiazide (HYDRODIURIL) 12.5 mg tablet Take 1 Tablet by mouth daily. 180 Tablet 1     cyanocobalamin (Vitamin B-12) 1,000 mcg tablet Take 1 Tablet by mouth daily. 90 Tablet 1    cholecalciferol, vitamin D3, (VITAMIN D3) 2,000 unit tab Take 1 Tab by mouth daily. 30 Tab 2     Allergies   Allergen Reactions    Other Food Rash     "Preservatives"    Caffeine Palpitations       Family History   Problem Relation Age of Onset    Diabetes Mother     Hypertension Mother      Social History     Tobacco Use    Smoking status: Former    Smokeless tobacco: Never   Substance Use Topics    Alcohol use: No         Emily Rend, LPN

## 2022-01-31 ENCOUNTER — Encounter

## 2022-01-31 MED ORDER — DONEPEZIL HCL 5 MG PO TABS
5 MG | ORAL_TABLET | Freq: Every evening | ORAL | 3 refills | Status: DC
Start: 2022-01-31 — End: 2022-05-04

## 2022-01-31 MED ORDER — FENOFIBRATE 145 MG PO TABS
145 MG | ORAL_TABLET | Freq: Every day | ORAL | 2 refills | Status: DC
Start: 2022-01-31 — End: 2022-05-04

## 2022-01-31 MED ORDER — VERAPAMIL HCL ER 240 MG PO CP24
240 MG | ORAL_CAPSULE | Freq: Every day | ORAL | 2 refills | Status: DC
Start: 2022-01-31 — End: 2022-05-04

## 2022-01-31 MED ORDER — METOPROLOL TARTRATE 50 MG PO TABS
50 MG | ORAL_TABLET | Freq: Two times a day (BID) | ORAL | 2 refills | Status: AC
Start: 2022-01-31 — End: 2022-05-04

## 2022-01-31 NOTE — Telephone Encounter (Signed)
Med refill sent to PCP.

## 2022-01-31 NOTE — Telephone Encounter (Signed)
Patients son Alinda Money called with questions regarding several of [facility] Tower[facility]'s medication. Please advise and follow up

## 2022-04-28 ENCOUNTER — Encounter

## 2022-04-28 MED ORDER — METOPROLOL TARTRATE 25 MG PO TABS
25 MG | ORAL_TABLET | Freq: Two times a day (BID) | ORAL | 2 refills | Status: DC
Start: 2022-04-28 — End: 2022-05-04

## 2022-04-28 NOTE — Telephone Encounter (Signed)
Requested Prescriptions     Pending Prescriptions Disp Refills    metoprolol tartrate (LOPRESSOR) 25 MG tablet 60 tablet      Sig: Take 1 tablet by mouth 2 times daily

## 2022-05-04 ENCOUNTER — Encounter

## 2022-05-04 MED ORDER — METOPROLOL TARTRATE 50 MG PO TABS
50 MG | ORAL_TABLET | Freq: Two times a day (BID) | ORAL | 2 refills | Status: DC
Start: 2022-05-04 — End: 2022-05-10

## 2022-05-04 MED ORDER — FENOFIBRATE 145 MG PO TABS
145 MG | ORAL_TABLET | Freq: Every day | ORAL | 2 refills | Status: DC
Start: 2022-05-04 — End: 2022-05-10

## 2022-05-04 MED ORDER — METOPROLOL TARTRATE 25 MG PO TABS
25 MG | ORAL_TABLET | Freq: Two times a day (BID) | ORAL | 2 refills | Status: DC
Start: 2022-05-04 — End: 2022-05-10

## 2022-05-04 MED ORDER — DONEPEZIL HCL 5 MG PO TABS
5 MG | ORAL_TABLET | Freq: Every evening | ORAL | 3 refills | Status: DC
Start: 2022-05-04 — End: 2022-05-10

## 2022-05-04 MED ORDER — VERAPAMIL HCL ER 240 MG PO CP24
240 MG | ORAL_CAPSULE | Freq: Every day | ORAL | 2 refills | Status: DC
Start: 2022-05-04 — End: 2022-05-10

## 2022-05-04 NOTE — Telephone Encounter (Signed)
Requested Prescriptions     Pending Prescriptions Disp Refills    metoprolol tartrate (LOPRESSOR) 25 MG tablet 60 tablet 2     Sig: Take 1 tablet by mouth 2 times daily    metoprolol tartrate (LOPRESSOR) 50 MG tablet 60 tablet 2     Sig: Take 1 tablet by mouth 2 times daily    donepezil (ARICEPT) 5 MG tablet 30 tablet 3     Sig: Take 1 tablet by mouth nightly    fenofibrate (TRICOR) 145 MG tablet 30 tablet 2     Sig: Take 1 tablet by mouth daily    verapamil (VERELAN) 240 MG extended release capsule 30 capsule 2     Sig: Take 1 capsule by mouth daily

## 2022-05-10 ENCOUNTER — Encounter: Attending: Internal Medicine | Primary: Internal Medicine

## 2022-05-10 ENCOUNTER — Telehealth: Admit: 2022-05-10 | Payer: MEDICARE | Attending: Internal Medicine | Primary: Internal Medicine

## 2022-05-10 DIAGNOSIS — I10 Essential (primary) hypertension: Secondary | ICD-10-CM

## 2022-05-10 MED ORDER — DONEPEZIL HCL 5 MG PO TABS
5 MG | ORAL_TABLET | Freq: Every evening | ORAL | 3 refills | Status: DC
Start: 2022-05-10 — End: 2022-08-25

## 2022-05-10 MED ORDER — VERAPAMIL HCL ER 240 MG PO CP24
240 MG | ORAL_CAPSULE | Freq: Every day | ORAL | 2 refills | Status: AC
Start: 2022-05-10 — End: 2022-08-25

## 2022-05-10 MED ORDER — FENOFIBRATE 145 MG PO TABS
145 MG | ORAL_TABLET | Freq: Every day | ORAL | 2 refills | Status: AC
Start: 2022-05-10 — End: 2022-08-25

## 2022-05-10 MED ORDER — METOPROLOL TARTRATE 50 MG PO TABS
50 MG | ORAL_TABLET | Freq: Two times a day (BID) | ORAL | 2 refills | Status: AC
Start: 2022-05-10 — End: 2022-08-25

## 2022-05-10 MED ORDER — METOPROLOL TARTRATE 25 MG PO TABS
25 MG | ORAL_TABLET | Freq: Two times a day (BID) | ORAL | 2 refills | Status: AC
Start: 2022-05-10 — End: 2022-08-25

## 2022-05-10 MED ORDER — METOPROLOL TARTRATE 50 MG PO TABS
50 MG | ORAL_TABLET | Freq: Two times a day (BID) | ORAL | 2 refills | Status: DC
Start: 2022-05-10 — End: 2022-05-10

## 2022-05-10 NOTE — Progress Notes (Signed)
Emily Henson presents today for   Chief Complaint   Patient presents with    Follow-up    Memory Loss         Depression Screening:  PHQ-9 Questionaire 05/10/2022   Little interest or pleasure in doing things 0   Feeling down, depressed, or hopeless 0   PHQ-9 Total Score 0       Abuse Screening:  AMB Abuse Screening 05/10/2022   Do you ever feel afraid of your partner? N   Are you in a relationship with someone who physically or mentally threatens you? N   Is it safe for you to go home? Y       Fall Screening  Fall Risk 05/10/2022   2 or more falls in past year? no   Fall with injury in past year? yes       Generalized Anxiety  GAD-7 SCREENING 05/10/2022   Feeling nervous, anxious, or on edge Not at all   Not being able to stop or control worrying Not at all   Worrying too much about different things Not at all   Trouble relaxing Not at all   Being so restless that it is hard to sit still Not at all   Becoming easily annoyed or irritable Not at all   Feeling afraid as if something awful might happen Not at all   GAD-7 Total Score 0        Health Maintenance Due   Topic Date Due    Shingles vaccine (2 of 3) 12/11/2012    COVID-19 Vaccine (2 - Pfizer series) 02/02/2021   .      Health Maintenance reviewed and discussed and ordered per Provider.  Vaccines Due   Screenings Due       Emily Henson is updated on all HM    1. "Have you been to the ER, urgent care clinic since your last visit?  Hospitalized since your last visit?" No    2. "Have you seen or consulted any other health care providers outside of the [facility] since your last visit?" No     3. For patients aged 28-75: Has the patient had a colonoscopy / FIT/ Cologuard? N/A     If the patient is female:    4. For patients aged 24-74: Has the patient had a mammogram within the past 2 years? N/A    5. For patients aged 21-65: Has the patient had a pap smear? N/A

## 2022-05-10 NOTE — Progress Notes (Signed)
Emily Henson (DOB:  27-Jan-1933) is a Established patient, presenting virtually for evaluation of the following:    Assessment & Plan   Below is the assessment and plan developed based on review of pertinent history, physical exam, labs, studies, and medications.  1. Essential (primary) hypertension  -     metoprolol tartrate (LOPRESSOR) 50 MG tablet; Take 1 tablet by mouth 2 times daily, Disp-60 tablet, R-2Patient is taking 75 mg dailyNormal  2. Atrial fibrillation, unspecified type (HCC)  -     verapamil (VERELAN) 240 MG extended release capsule; Take 1 capsule by mouth daily, Disp-30 capsule, R-2Normal  -     metoprolol tartrate (LOPRESSOR) 25 MG tablet; Take 1 tablet by mouth 2 times daily, Disp-60 tablet, R-2Normal  -     metoprolol tartrate (LOPRESSOR) 50 MG tablet; Take 1 tablet by mouth 2 times daily, Disp-60 tablet, R-2Patient is taking 75 mg dailyNormal  3. Moderate Alzheimer's dementia without behavioral disturbance, psychotic disturbance, mood disturbance, or anxiety, unspecified timing of dementia onset (HCC)  -     External Referral To Neurology  -     donepezil (ARICEPT) 5 MG tablet; Take 1 tablet by mouth nightly, Disp-30 tablet, R-3Normal  4. Pure hyperglyceridemia  -     fenofibrate (TRICOR) 145 MG tablet; Take 1 tablet by mouth daily, Disp-30 tablet, R-2Normal  5. Primary osteoarthritis involving multiple joints  6. Sensorineural hearing loss (SNHL) of both ears  -     External Referral To ENT    Return in about 4 months (around 09/10/2022).          HTN: Patient had stopped taking the HCTZ.  Advised to keep a BP log.    Hypertriglyceridemia: Continue fenofibrate.     Memory loss: Likely has dementia. Continue donepezil. Refer neurology.    Patient experiencing decreased hearing.  Refer to ENT     Atrial fibrillation: Following with cardiology.  Continue Eliquis, Lopressor 75 mg every day.    OA: Continue Tylenol as needed      Subjective   Patient has a PMHx of HTN, atrial fibrillation, SSS s/p  PPM, blindness, hypertriglyceridemia, dementia.    HTN: Patient is on metoprolol, HCTZ and verapamil.     Atrial fibrillation: She has a diagnosis of SSS s/p pacemaker.  Currently on Eliquis, Lopressor and verapamil.  Follows with cardiology.      Hypertriglyceridemia: Lipid panel in 1/23 showed triglyceride 215, LDL 104.  HbA1c was 5.6%.     Memory loss: Reports forgetting things, has to frequently repeat things, worse in the last 6 months. Patient is donepezil.  Vitamin B12, folate, TSH were all normal.       Review of Systems   Constitutional:  Negative for activity change, appetite change, chills, diaphoresis, fatigue, fever and unexpected weight change.   HENT:  Positive for hearing loss. Negative for congestion, nosebleeds, postnasal drip and rhinorrhea.    Eyes:         Blind   Respiratory:  Negative for cough, chest tightness, shortness of breath and wheezing.    Cardiovascular:  Negative for chest pain.   Gastrointestinal:  Negative for abdominal pain, blood in stool, diarrhea, nausea and vomiting.   Endocrine: Negative for polydipsia and polyphagia.   Genitourinary:  Negative for decreased urine volume, dysuria, frequency and pelvic pain.   Musculoskeletal:  Positive for arthralgias and back pain.   Neurological:  Positive for weakness. Negative for dizziness and headaches.   Psychiatric/Behavioral:  Negative for agitation and confusion.  The patient is not nervous/anxious.         Memory loss   All other systems reviewed and are negative.       Objective   Patient-Reported Vitals  No data recorded     Physical Exam  [INSTRUCTIONS:  "[x] " Indicates a positive item  "[] " Indicates a negative item  -- DELETE ALL ITEMS NOT EXAMINED]    Constitutional: [x]  Appears well-developed and well-nourished [x]  No apparent distress      []  Abnormal -     Mental status: [x]  Alert and awake  [x]  Oriented to person/place/time [x]  Able to follow commands    []  Abnormal -     Eyes:   EOM    [x]   Normal    []  Abnormal -    Sclera  [x]   Normal    []  Abnormal -          Discharge [x]   None visible   []  Abnormal -     HENT: [x]  Normocephalic, atraumatic  []  Abnormal -   [x]  Mouth/Throat: Mucous membranes are moist    External Ears [x]  Normal  []  Abnormal -    Neck: [x]  No visualized mass []  Abnormal -     Pulmonary/Chest: [x]  Respiratory effort normal   [x]  No visualized signs of difficulty breathing or respiratory distress        []  Abnormal -      Musculoskeletal:   [x]  Normal gait with no signs of ataxia         [x]  Normal range of motion of neck        []  Abnormal -     Neurological:        [x]  No Facial Asymmetry (Cranial nerve 7 motor function) (limited exam due to video visit)          [x]  No gaze palsy        []  Abnormal -          Skin:        [x]  No significant exanthematous lesions or discoloration noted on facial skin         []  Abnormal -            Psychiatric:       [x]  Normal Affect []  Abnormal -        [x]  No Hallucinations    Other pertinent observable physical exam findings:-  Decreased hearing           [name], was evaluated through a synchronous (real-time) audio-video encounter. The patient (or guardian if applicable) is aware that this is a billable service, which includes applicable co-pays. This Virtual Visit was conducted with patient's (and/or legal guardian's) consent. Patient identification was verified, and a caregiver was present when appropriate.   The patient was located at Home: [address] [place]   Provider was located at (Appt Dept): [address]  [address]  [place],           -- , MD

## 2022-07-07 ENCOUNTER — Encounter

## 2022-07-07 NOTE — Telephone Encounter (Signed)
Requested Prescriptions     Pending Prescriptions Disp Refills    apixaban (ELIQUIS) 5 MG TABS tablet 60 tablet      Sig: Take 1 tablet by mouth 2 times daily

## 2022-07-11 MED ORDER — APIXABAN 5 MG PO TABS
5 MG | ORAL_TABLET | Freq: Two times a day (BID) | ORAL | 5 refills | Status: DC
Start: 2022-07-11 — End: 2022-12-22

## 2022-08-25 ENCOUNTER — Telehealth

## 2022-08-25 MED ORDER — FENOFIBRATE 145 MG PO TABS
145 MG | ORAL_TABLET | Freq: Every day | ORAL | 2 refills | Status: AC
Start: 2022-08-25 — End: 2023-02-07

## 2022-08-25 MED ORDER — METOPROLOL TARTRATE 50 MG PO TABS
50 MG | ORAL_TABLET | Freq: Two times a day (BID) | ORAL | 2 refills | Status: DC
Start: 2022-08-25 — End: 2022-11-19

## 2022-08-25 MED ORDER — VERAPAMIL HCL ER 240 MG PO CP24
240 MG | ORAL_CAPSULE | Freq: Every day | ORAL | 2 refills | Status: AC
Start: 2022-08-25 — End: 2022-11-19

## 2022-08-25 MED ORDER — METOPROLOL TARTRATE 25 MG PO TABS
25 MG | ORAL_TABLET | Freq: Two times a day (BID) | ORAL | 2 refills | Status: DC
Start: 2022-08-25 — End: 2022-11-19

## 2022-08-25 MED ORDER — DONEPEZIL HCL 5 MG PO TABS
5 MG | ORAL_TABLET | Freq: Every evening | ORAL | 3 refills | Status: AC
Start: 2022-08-25 — End: ?

## 2022-08-25 NOTE — Telephone Encounter (Signed)
Patient called and is requesting a medication refill. Please advise    metoprolol tartrate (LOPRESSOR) 25 MG tablet     metoprolol tartrate (LOPRESSOR) 50 MG tablet     verapamil (VERELAN) 240 MG extended release capsule     donepezil (ARICEPT) 5 MG tablet     fenofibrate (TRICOR) 145 MG tablet

## 2022-09-11 ENCOUNTER — Ambulatory Visit
Admit: 2022-09-11 | Discharge: 2022-09-11 | Payer: TRICARE (CHAMPUS) | Attending: Acute Care | Primary: Internal Medicine

## 2022-09-11 DIAGNOSIS — R0683 Snoring: Secondary | ICD-10-CM

## 2022-09-11 NOTE — Progress Notes (Unsigned)
[facility]  [address], [place] [address]  Office:  [phone]  Fax: [phone]    Referring: ***    No chief complaint on file.      HPI:  ***    She is with her daughter who provides history.  Husband is in hospice now.  Patient is blind.  Feels good.  No pain.  Listens to radio and tv.  Sleeps.  Used to listen to audio books.  Does ADLs.  Daughter helps with placing her medications out.  Hasn't had troube with artirtis lately.  Has had a cane.  Blind in the '80s-'90s.  On Eliquis for A. Fib.  Taking vitamin B12.  Aricept 5 mg.  No significant headaches.  B12 elevated in January.  TSH normal in January.  No improvement noticed on Aricept.  Daughter notices worsening short term memory over time.  Long term is good.  Inability to maneuver through the house.  Not too much misplacing items.  Feels good mental health wise.  Admits she gets antsy sometimes.  Nothing significant.  Some yelling.  4 people in the house.  +delusions per daughter, thinking there are people in the house (the kids).  Not bothersome to her per patient but daughter thinks it is.  Has 4 steps into house, does not go upstairs.  Hearing is ok but daughter wonders about it.  PCP referred to ENT due to hearing loss.  No history of stroke, seizures, significant head injury, or LOC.  No tremor.  No urinary incontinence.  Sometimes dysuria if she does not drink enough water.  Some weight gain.  Eating/drinking ok, swallowing ok.  Sleep is good.  No acting out dreams.  Snores, sometimes loud.  When she is supine.  Was cooking up until 5 years ago.  Does not drive.  Was living in [place], [place], [place] before there.      Social history: Worked at [employer], Psychologist, counselling.  Retired around 1988.  Lives with husband, daughter, son.  No smoking, alcohol, or drug use.  Rare glass of wine.      Family history: Her mother had atherosclerosis.  Sister had sleep apnea.  Half sister has dementia.  Mother had diabetes.      Social  History     Socioeconomic History    Marital status: Married     Spouse name: Not on file    Number of children: Not on file    Years of education: Not on file    Highest education level: Not on file   Occupational History    Not on file   Tobacco Use    Smoking status: Former    Smokeless tobacco: Never   Substance and Sexual Activity    Alcohol use: No    Drug use: No    Sexual activity: Not on file   Other Topics Concern    Not on file   Social History Narrative    Not on file     Social Determinants of Health     Financial Resource Strain: Not on file   Food Insecurity: Not on file   Transportation Needs: Not on file   Physical Activity: Not on file   Stress: Not on file   Social Connections: Not on file   Intimate Partner Violence: Not on file   Housing Stability: Not on file       Family History   Problem Relation Age of Onset  Diabetes Mother     Hypertension Mother        Current Outpatient Medications   Medication Sig Dispense Refill    metoprolol tartrate (LOPRESSOR) 50 MG tablet Take 1 tablet by mouth 2 times daily 60 tablet 2    metoprolol tartrate (LOPRESSOR) 25 MG tablet Take 1 tablet by mouth 2 times daily 60 tablet 2    verapamil (VERELAN) 240 MG extended release capsule Take 1 capsule by mouth daily 30 capsule 2    donepezil (ARICEPT) 5 MG tablet Take 1 tablet by mouth nightly 30 tablet 3    fenofibrate (TRICOR) 145 MG tablet Take 1 tablet by mouth daily 30 tablet 2    apixaban (ELIQUIS) 5 MG TABS tablet Take 1 tablet by mouth 2 times daily 60 tablet 5    polyethyl glycol-propyl glycol 0.4-0.3 % (SYSTANE) 0.4-0.3 % ophthalmic solution Apply 2 drops to eye 2 times daily      calcium carbonate 1500 (600 Ca) MG TABS tablet Take 1 tablet by mouth daily      Cholecalciferol 50 MCG (2000 UT) TABS Take 1 tablet by mouth daily      cyanocobalamin 1000 MCG tablet Take 1 tablet by mouth daily      hydroCHLOROthiazide (HYDRODIURIL) 12.5 MG tablet Take 1 tablet by mouth daily      meclizine (ANTIVERT) 25 MG  tablet Take 1 tablet by mouth every 6 hours as needed       No current facility-administered medications for this visit.       Past Medical History:   Diagnosis Date    Arrhythmia     Atrial Fibrillation    Arthritis     CAD (coronary artery disease)     Hypercholesterolemia     hypertriglycerides    Hypertension        Past Surgical History:   Procedure Laterality Date    APPENDECTOMY      BREAST REDUCTION SURGERY  1990    CHOLECYSTECTOMY      COLONOSCOPY      GYN      hysterectomy    PACEMAKER      TOTAL COLECTOMY      TOTAL COLECTOMY         Allergies   Allergen Reactions    Caffeine Palpitations    Other Rash     "Preservatives"       Patient Active Problem List   Diagnosis    Pacemaker    Hypertension    Hypertriglyceridemia    Atrial fibrillation (HCC)    Blind in both eyes         Review of Systems:   Constitutional: no fever or chills  Skin denies rash or itching  HEENT:  Denies tinnitus, hearing loss, or visual changes  Respiratory: denies shortness of breath  Cardiovascular: denies chest pain, dyspnea on exertion  Gastrointestinal: does not report nausea or vomiting  Genitourinary: does not report dysuria or incontinence  Musculoskeletal: does not report joint pain or swelling  Endocrine: denies weight change  Hematology: denies easy bruising or bleeding   Neurological: as above in HPI      PHYSICAL EXAMINATION:      VITAL SIGNS:  BP 124/84   Pulse 66   Resp 18   Ht 5\' 5"  (1.651 m)   Wt 175 lb (79.4 kg)   SpO2 99%   BMI 29.12 kg/m     GENERAL: Well developed, well nourished, in no apparent distress.   HEART: RR,  no murmurs heard, no carotid bruits.  LUNGS:                      Respirations regular and unlabored.  EXTREMITIES: No clubbing, cyanosis, or edema is identified.  Pulses 2+    and symmetrical.  HEAD:   Normocephalic, atraumatic.    NEUROLOGIC EXAMINATION    MENTAL STATUS: Awake, alert, and oriented x 4. Attention and STM are grossly normal. There is no aphasia. Fund of knowledge is  adequate.  Mood and affect are appropriate.  CRANIAL NERVES: Visual fields are full to confrontation. No fundus anomalies observed. Pupils are reactive to light and accommodation.   Extraocular movements are intact and there is no nystagmus.   Facial sensation is normal.  Face is symmetrical.   Hearing is grossly intact.   SCM/TPZ 5/5.  Palate rises symmetrically. Tongue is in the midline.        MOTOR:  Normal tone, bulk, and strength, 5/5 muscle strength throughout.  No cogwheel rigidity or clonus present.  No drift of the bilateral upper extremities.  Fine finger movements symmetrical.    CEREBELLAR: Finger to nose was normal.   No tremors or dysmetria.    SENSORY: Normal PP, vibration, LT  Decr pp distal LE    DTRs: +2 throughout, toes downgoing.     GAIT:   Normal gait.        I have personally reviewed imaging studies.     {DATA RFFMBW:[id number]}      Impression/Plan:   Arlynn Stare is a 88 y.o. female whose history and physical are consistent with ***.      Ct head  Aricept at 5 mg  Possible dementia  Neuropsych referral  Sleep specialist referral  Hearing eval    There are no diagnoses linked to this encounter.    I spent *** minutes with the patient in face-to-face consultation, with *** minutes spent in counseling and coordination of care as described above.      Signed By: Maurine Simmering, APRN - NP              PLEASE NOTE:   Portions of this document may have been produced using voice recognition software. Unrecognized errors in transcription may be present.

## 2022-09-11 NOTE — Patient Instructions (Addendum)
Patient instructions:  -CT head- scheduling will call  -sleep specialist evaluation: Sleep specialist- [facility]- [phone] Dr. Ardelia Mems  -check on ENT referral for hearing loss [facility])  -Aricept at 5 mg daily  -neuropsychological evaluation-- call if you do not hear about this in 1 week

## 2022-09-20 ENCOUNTER — Inpatient Hospital Stay: Admit: 2022-09-20 | Payer: MEDICARE | Primary: Internal Medicine

## 2022-09-20 DIAGNOSIS — R413 Other amnesia: Secondary | ICD-10-CM

## 2022-09-25 ENCOUNTER — Encounter

## 2022-09-28 MED ORDER — DONEPEZIL HCL 5 MG PO TABS
5 MG | ORAL_TABLET | Freq: Every evening | ORAL | 3 refills | Status: DC
Start: 2022-09-28 — End: 2023-01-11

## 2022-11-19 ENCOUNTER — Encounter

## 2022-11-27 MED ORDER — METOPROLOL TARTRATE 25 MG PO TABS
25 MG | ORAL_TABLET | Freq: Two times a day (BID) | ORAL | 2 refills | Status: AC
Start: 2022-11-27 — End: 2023-03-05

## 2022-11-27 MED ORDER — VERAPAMIL HCL ER 240 MG PO CP24
240 MG | ORAL_CAPSULE | Freq: Every day | ORAL | 2 refills | Status: DC
Start: 2022-11-27 — End: 2023-03-05

## 2022-11-27 MED ORDER — METOPROLOL TARTRATE 50 MG PO TABS
50 MG | ORAL_TABLET | Freq: Two times a day (BID) | ORAL | 2 refills | Status: DC
Start: 2022-11-27 — End: 2023-03-05

## 2022-12-22 ENCOUNTER — Telehealth

## 2022-12-22 MED ORDER — APIXABAN 5 MG PO TABS
5 MG | ORAL_TABLET | Freq: Two times a day (BID) | ORAL | 5 refills | Status: AC
Start: 2022-12-22 — End: 2023-07-19

## 2022-12-22 MED ORDER — BENZONATATE 100 MG PO CAPS
100 MG | ORAL_CAPSULE | Freq: Three times a day (TID) | ORAL | 0 refills | Status: AC | PRN
Start: 2022-12-22 — End: 2023-01-01

## 2022-12-22 NOTE — Telephone Encounter (Signed)
Daughter request Med. Due to mother has a cold, and running nose, and cough.... biggest concern....    Currently taking   Nyquil      Needs a refill on Eliquis    Location  [address] [place] [address]    Request call to daughter if new med for cough is given

## 2022-12-22 NOTE — Telephone Encounter (Signed)
Please inform that I have prescribed Tessalon Perles for cough.

## 2023-01-11 ENCOUNTER — Ambulatory Visit: Admit: 2023-01-11 | Discharge: 2023-01-11 | Payer: MEDICARE | Attending: Acute Care | Primary: Internal Medicine

## 2023-01-11 DIAGNOSIS — F03A0 Unspecified dementia, mild, without behavioral disturbance, psychotic disturbance, mood disturbance, and anxiety: Secondary | ICD-10-CM

## 2023-01-11 MED ORDER — DONEPEZIL HCL 10 MG PO TABS
10 MG | ORAL_TABLET | Freq: Every evening | ORAL | 5 refills | Status: AC
Start: 2023-01-11 — End: 2023-02-07

## 2023-01-11 NOTE — Patient Instructions (Signed)
[facility]- [phone]    Home health referral    Increase donepezil.

## 2023-01-11 NOTE — Progress Notes (Unsigned)
[name]  [address], [name], [place] [address]  Office:  [phone]  Fax: [phone]  Chief Complaint   Patient presents with    Follow-up       HPI: ***    She presents today in follow-up.  She is with her daughter.  Continues to have short term memory impairment.  Does not do much- sleeps a lot.  Continues Aricept 5 mg nightly.  Sometimes had weird dreams after her husband passed away recently.  Eating/drinking well.  No hallucinations, behavior change, or safety issues.  Needs some help with ADLs, getting in the shower.  Not walking much.  Taking short steps.  No falls, but near misses.  Guides with cane due to blindness.        From previous encounter 09/11/2022:  "HPI:  Emily Henson presents in evaluation for dementia.  She has medical history to include hypertension, hypertriglyceridemia, atrial fibrillation, CAD, arthritis, blind in both eyes, and has a pacemaker.     She presents today in evaluation.  She is with her daughter who provides history.  Her husband has dementia and is in hospice now.  Patient's daughter and son care for them.  Patient is blind.  Feels good.  No pain.  Listens to radio and tv.  Used to listen to audio books.  Does ADLs.  Daughter helps with placing her medications out.  Hasn't had trouble with arthritis lately.  Has had a cane.  Blind since the '80s-'90s.  On Eliquis for A. Fib.  Taking vitamin B12.  Aricept 5 mg.  No improvement noticed on Aricept.  No significant headaches.  Vitamin B12 level elevated in January.  TSH normal in January.  Daughter notices worsening short term memory over time.  Long term is good.  Inability to maneuver through the house.  Not too much misplacing items.  Feels good mental health wise.  Admits she gets antsy sometimes.  Nothing significant.  Some yelling.  4 people in the house.  +delusions per daughter, thinking there are people in the house (the kids).  Not bothersome to her per patient but daughter  thinks it is.  Has 4 steps into house, does not go upstairs.  Hearing is ok but daughter wonders about it.  PCP referred to ENT due to hearing loss.  No history of stroke, seizures, significant head injury, or LOC.  No tremor.  No urinary incontinence.  Sometimes dysuria if she does not drink enough water.  Some weight gain.  Eating/drinking ok, swallowing ok.  Sleep is good.  No acting out dreams.  Snores, sometimes loud.  When she is supine.  Was cooking up until 5 years ago.  Does not drive.  Was living in [place], [place], [place] before here.       Social history: Worked at [employer], Dispensing optician.  Retired around 1988.  Lives with husband, daughter, son.  No smoking, alcohol, or drug use.  Rare glass of wine.       Family history: Her mother had atherosclerosis.  Sister had sleep apnea.  Half sister has dementia.  Mother had diabetes."    Past Medical History:   Diagnosis Date    Arrhythmia     Atrial Fibrillation    Arthritis     CAD (coronary artery disease)     Hypercholesterolemia     hypertriglycerides    Hypertension        Past Surgical History:   Procedure Laterality Date  APPENDECTOMY      BREAST REDUCTION SURGERY  1990    CHOLECYSTECTOMY      COLONOSCOPY      GYN      hysterectomy    PACEMAKER      TOTAL COLECTOMY      TOTAL COLECTOMY         Current Outpatient Medications   Medication Sig Dispense Refill    apixaban (ELIQUIS) 5 MG TABS tablet Take 1 tablet by mouth 2 times daily 60 tablet 5    metoprolol tartrate (LOPRESSOR) 50 MG tablet Take 1 tablet by mouth 2 times daily 60 tablet 2    metoprolol tartrate (LOPRESSOR) 25 MG tablet Take 1 tablet by mouth 2 times daily 60 tablet 2    verapamil (VERELAN) 240 MG extended release capsule Take 1 capsule by mouth daily 30 capsule 2    donepezil (ARICEPT) 5 MG tablet Take 1 tablet by mouth nightly 30 tablet 3    fenofibrate (TRICOR) 145 MG tablet Take 1 tablet by mouth daily 30 tablet 2    polyethyl glycol-propyl glycol 0.4-0.3 % (SYSTANE) 0.4-0.3 % ophthalmic solution  Apply 2 drops to eye 2 times daily      calcium carbonate 1500 (600 Ca) MG TABS tablet Take 1 tablet by mouth daily      Cholecalciferol 50 MCG (2000 UT) TABS Take 1 tablet by mouth daily      cyanocobalamin 1000 MCG tablet Take 1 tablet by mouth daily      hydroCHLOROthiazide (HYDRODIURIL) 12.5 MG tablet Take 1 tablet by mouth daily      meclizine (ANTIVERT) 25 MG tablet Take 1 tablet by mouth every 6 hours as needed       No current facility-administered medications for this visit.        Allergies   Allergen Reactions    Caffeine Palpitations    Other Rash     "Preservatives"       Social History     Tobacco Use    Smoking status: Former    Smokeless tobacco: Never   Substance Use Topics    Alcohol use: No    Drug use: No       Family History   Problem Relation Age of Onset    Diabetes Mother     Hypertension Mother        Review of Systems:  GENERAL: Denies fever or fatigue  CARDIAC: No CP or SOB  PULMONARY: No cough or SOB  MUSCULOSKELETAL: No new joint pain  NEURO: SEE HPI    Physical Examination:  BP 124/87 (Site: Left Upper Arm, Position: Sitting, Cuff Size: Large Adult)   Pulse (!) 107   Temp (!) 96.3 F (35.7 C) (Oral)   Resp 20   Ht 1.651 m (5\' 5" )   Wt 79.7 kg (175 lb 9.6 oz)   SpO2 98%   BMI 29.22 kg/m     Alert, in NAD. Heart is regular. Oriented x3. Doctor's office, state. Not town.  Correct month, year. Speech is fluent. Speech clear. EOMs are full, PERRL, VFFTC, no nystagmus. No facial asymmetry. Tongue is midline. Strength and tone are normal. No drift of the bilateral upper extremities. Fine finger movements symmetrical. FNF intact bilaterally. DTRs +2, gait symmetric.    CT HEAD  Narrative & Impression  EXAM: CT HEAD WO CONTRAST     CLINICAL INDICATION/HISTORY: Other amnesia     COMPARISON: No comparisons      TECHNIQUE: Axial images of the  brain were obtained, without the administration  of intravenous contrast. Coronal and sagittal reconstructions were generated.     All CT scans at  this facility are performed using dose optimization technique as  appropriate to a performed exam, to include automated exposure control,  adjustment of the MA and/or kV according to patient size (including appropriate  matching for site-specific examinations) or use of  iterative reconstruction  technique.     FINDINGS:      Brain: Gray-white matter differentiation is preserved. There is no mass or mass  effect. There are moderate periventricular and deep white matter hypodensities  consistent with chronic microvascular ischemia. There is global atrophy.      Paranasal sinuses and mastoid air cells: Paranasal sinuses are clear. Mastoid  air cells are clear.     Calvarium: No acute fracture.        IMPRESSION:     1.  No acute intracranial process.  2.  Sequela of chronic microvascular ischemia and global atrophy.      Impression/Plan:  Emily Henson is a 89 y.o. female whose history and physical are consistent with ***.      There are no diagnoses linked to this encounter.  Total time *** minutes with *** minutes spent in counseling.     Signed By: Maurine Simmering, APRN - NP        PLEASE NOTE:   Portions of this document may have been produced using voice recognition software. Unrecognized errors in transcription may be present.

## 2023-01-14 ENCOUNTER — Encounter: Payer: BLUE CROSS/BLUE SHIELD | Primary: Internal Medicine

## 2023-01-15 ENCOUNTER — Encounter: Payer: BLUE CROSS/BLUE SHIELD | Primary: Internal Medicine

## 2023-01-16 ENCOUNTER — Encounter: Admit: 2023-01-16 | Discharge: 2023-01-16 | Payer: MEDICARE | Primary: Internal Medicine

## 2023-01-16 NOTE — Home Health (Signed)
Skilled services/Home bound verification:     Skilled Reason for admission/summary of clinical condition: Patient is an 89 y.o female with PMHx of Arrhythmia Atrial Fibrillation Arthritis CAD (coronary artery disease) Hypercholesterolemia hypertriglycerides Hypertension referred by Estevan Oaks, APRN - NP on 01/13/23 to [facility] for Evaluate and Treat, strength training, gait training, balance training, safety training, functional mobility, and home assessment due to fall risk (Pt is blind), [employer] [facility] for assistance with Activities of Daily Living. MSW.    This patient is homebound for the following reasons Requires considerable and taxing effort to leave the home , Requires the assistance of 1 or more persons to leave the home , only leaves the home for medical reasons or religious services and are infrequent and of short duration for other reasons  and decreased mental status requiring 24-hour supervision .    Caregiver: Daughter. Discussed with patient and caregiver(s) availability and willingness to provide care.    Primary caregiver is available 24/7 and is able to assist with bathing, dressing, walking, turn in bed, bathroom, meal prep and setup, medication management, grocery shopping, household chores, transportation to MD appointment, home exercise program, transferring from bed to chair and transferring to assistive device.    Medications reconciled and all medications are available in the home this visit.   The following education was provided regarding medications: Eliquis.  Medications  are EFFECTIVE at this time.     High risk medication teaching regarding anticoagulants, hyperglycemic agents or opioids, narcotics performed (specify) Eliquis- Instructed patient/caregiver to notify SN/PT of signs and symptoms of anticoagulant adverse effects including bleeding gums, blood in urine or stool, easily bruising.  Instructed on importance of fall prevention due to risk for bleeding.      Estevan Oaks, APRN - NP notified of any discrepancies/look a like medications/medication interactions .     Home health supplies by type and quantity ordered/delivered this visit include NA.    Pt/Caregiver instructed on plan of care and ARE agreeable to plan of care at this time.     Physician  Estevan Oaks, APRN - NP notified of patient admission to home health and plan of care including anticipated frequency of 2w5, 1w1 and treatments/interventions/modalities of education on pain management techniques, graded therapeutic exercises, coordination and balance training, bed mobility training, transfers training, HEP ed, education on energy conservation techniques, gait/steps management training and d/c planning.    Discharge planning discussed with patient and caregiver. Discharge planning as follows: Discharge to self, family and under the supervision of MD once patient has met all goals or reached maximum potential.  Pt/Caregiver  DID verbalize understanding of discharge planning.     Next MD appointment 02/07/23 with Kizzie Fantasia, MD. Patient/caregiver encouraged/instructed to keep appointment as lack of follow through with physician appointment could result in discontinuation of home care services for non-compliance.    A written copy of the [facility] of Rights was given and verbally reviewed on this visit. The patient or representative was given the opportunity to ask pertinent questions regarding the bill of rights. [facility] of rights information was received by patient. Reviewed with patient and daughter. Admission handbook left in the home.   Emergency Evacuation Information   Reviewed emergency plan with patient/caregiver.  -  Subjective:   Pain: Patient c/o intermittent achy  pain on back  with highest pain level of 8/10 and least pain level of 4/10 for the past 24 hours. Patient instructed on pain management  techniques such as application of ice packs for soreness and achy  type of pain /hot  packs for  achy, stiffness type of pain on painful area x 20 mins, positioning and relaxation.  -  Objective  Balance:  Unsupported sitting: Normal  Static Standing balance: Fair  Dynamic standing balance: NA d/t safety reasons  Tinetti balance and gait test: 6/28 indicative of high fall risks.  ROM: WFL on bilateral LEs.  BLE MMT:   R hip flex 3+/5   R hip Abd 4-/5   R hip add 4-/5   R knee flex 4-/5  R knee ext. 3+/5    R ankle DF 4-/5  L hip flex 3+/5  L hip Abd 4-/5  L hip add 4-/5  L knee flex 4-/5  L knee ext. 3+/5  L ankle DF 4-/5    FTSTS:  unable. requires bil UE assistance and intermittent touching assistance to safely complete sit to stand.  -  Assessment:    Patient is an 89 y.o female with PMHx of Arrhythmia Atrial Fibrillation Arthritis CAD (coronary artery disease) Hypercholesterolemia hypertriglycerides Hypertension referred by Estevan Oaks, APRN - NP on 01/13/23 to [facility] for Evaluate and Treat, strength training, gait training, balance training, safety training, functional mobility, and home assessment due to fall risk (Pt is blind), [employer] [facility] for assistance with Activities of Daily Living. MSW.    Patient is now back to one level private residence with 4 steps with rail from front door.    Patient lives with daughter. Daughter is primary cg present during this visit.     Discussed with patient and caregiver(s) availability and willingness to provide care.    Primary caregiver is available 24/7 and is able to assist with bathing, dressing, walking, turn in bed, bathroom, meal prep and setup, medication management,  grocery shopping, household chores, transportation to MD appointment, home exercise program, transferring from bed to chair and transferring to assistive device.    Patient has a PLOF of supervision assistance with ADLs, bed mobility, transfers and ambulation using white stick both for household and community distances.    During this visit, patient requires touching assistance in bed  mobility for  LE management and constant verbal cues for proper techniques, minimal  assistance in safe transfers to and from bed, chair and toilet using white stick with constant verbal cues for proper hand placements and techniques.     Patient is limited in ambulation to 70 ft using white stick requiring minimal assistance and constant verbal cues for redirection. Impairments in gait include forward lean, downward gaze, decreased  hip/knee flexion during swing phase, unequal step length, decreased cadence, wide BOS, ataxic gait.    Deferred step management assessment d/t safety reasons.   -  PATIENT EDUCATION PROVIDED THIS VISIT TO INCLUDE:   FALL PREVENTION TECHNIQUES: monitor medication that may alter mental status,  keeping walking pathways clean and clear of clutter and throw rugs, keeping night lights on for ability to see and easily, good visibility for safe transitioning thresholds and proper use and good compliance of using AD.  PAIN MANAGEMENT TECHNIQUES:  application of cold pack for soreness and achy  type of pain /hot  packs for achy, stiffness type of pain on painful area x 20 mins, positioning and relaxation.  PRESSURE ULCER PREVENTION TECHNIQUES:  proper positioning strategies including frequency of repositioning, prevention of shear and friction, appropriate skin hygiene and protection from moisture.  ENERGY CONSERVATION TECHNIQUES:  rest, slow  down, pacing, and complete tasks in manageable steps.  PRECAUTIONS: Blind on both eyes, PACEMAKER    PATIENT LEVEL OF UNDERSTANDING OF EDUCATION PROVIDED: Patient verbalized understanding and able to partially teach back information provided.    PATIENT RESPONSE TO PROCEDURE PERFORMED: Patient exhibiting difficulty in bed mobility, transfers, gait,  steps management and unable safely care for herself. These functional deficits were caused by bil LE weakness, decreased balance, poor activity tolerance as evidenced by functional tests. FTSTS: unable, Tinetti  and gait test: 6/28.    Patient unsafe to ambulate and  transfers to and from bed, chair, and toilet d/t weakness on bil LE, impaired balance, pain on back, SOBs with moderate exertion and impaired safety awareness.    Skilled PT services are necessary to increase safety bed mobility, transfers, and gait to decrease risk of rehospitalization, decrease risk for falls  and restore patient to a level of functionality that CG can manage at home. Without therapy, patient is at risk for falls, further immobility, increased dependence upon caregivers and failure to return to PLOF.  -  Plan: Patient is seen for initial PT eval today and POC established. Discussed to patient/cg POC and visit frequency of 2w5, 1w1. Patient/cg verbalized agreement. HHPT to work on education on pain management techniques, graded therapeutic exercises, balance and coordination  training, bed mobility training, transfers training, HEP ed, education on energy conservation techniques, fall prevention  ed, pressure ulcer prevention techniques, gait/steps management training and d/c planning.

## 2023-01-18 ENCOUNTER — Encounter: Admit: 2023-01-18 | Discharge: 2023-01-18 | Payer: MEDICARE | Primary: Internal Medicine

## 2023-01-18 NOTE — Home Health (Signed)
SUBJECTIVE: Pt up in chair when arrived, no c/o pain, no falls reported, no changes in medications daughter present during tx session    CAREGIVER INVOLVEMENT/ASSISTANCE NEEDED FOR: Daughter assist pt as needed   OBJECTIVE: See interventions  PATIENT EDUCATION PROVIDED THIS VISIT: Pt educated on fall prevention strategies, energy conservation tech and pressure relief  PATIENT RESPONSE TO EDUCATION: Pt verbalizes understanding of education   PATIENT RESPONSE TO TREATMENT/ASSESSMENT OF PROGRESS TOWARD GOALS: Established HEP consisting of seated strengthening to bilateral LEs instructed to perform 2x day    pt instructed to perform 2x day, sit to stand transfer tng from armed chair instruction to improve body mechanics and safety, gait tng on even surfaces w/ white cane VC/TC to improve safety pt instructed to be mindful of changes in surfaces, doorways to get a better feel for her location. Pt complains of minimal fatigue following tx session 4/10. Skilled PT intervention needed to address pain and swelling, deficits in strength, gait, transfers and balance to improve functional mobility and safety and lower risk for falls or injury.   PLAN FOR NEXT VISIT: Next visit will address gait, balance and strength  DISCHARGE PLANS: POC and Discharge plans discussed pt understands and agrees eventual DC once goals have been met or max HC benefits have been achieved. 9 visits remaining anticipated DC 02/19/2023

## 2023-01-19 ENCOUNTER — Encounter: Admit: 2023-01-19 | Discharge: 2023-01-19 | Payer: BLUE CROSS/BLUE SHIELD | Primary: Internal Medicine

## 2023-01-19 ENCOUNTER — Encounter: Payer: BLUE CROSS/BLUE SHIELD | Primary: Internal Medicine

## 2023-01-19 NOTE — Home Health (Signed)
MSW assessment completed with pt and her daughter/caregiver James Ivanoff, who resides in the home and does not work outside of the home. Pt's daughter also provided support for pt's deceased spouse, who also had Dementia. MSW discussed respite support and provided resource information for the blind in person and via e-mail ([email or web address]), and invited a return call if resource questions arise. Pt and daughter plan to relocate to more accessible housing in the future.

## 2023-01-22 ENCOUNTER — Encounter: Payer: BLUE CROSS/BLUE SHIELD | Primary: Internal Medicine

## 2023-01-23 ENCOUNTER — Encounter: Admit: 2023-01-23 | Discharge: 2023-01-23 | Payer: MEDICARE | Primary: Internal Medicine

## 2023-01-23 NOTE — Home Health (Addendum)
SUBJECTIVE: Pt up in chair when arrived, no c/o pain, no falls reported, no changes in medications    CAREGIVER INVOLVEMENT/ASSISTANCE NEEDED FOR: Daughter assist pt as needed   OBJECTIVE: See interventions  PATIENT EDUCATION PROVIDED THIS VISIT: Pt educated on fall prevention strategies, pressure relief and purpose/complications associated with use of Anticoagulants  PATIENT RESPONSE TO EDUCATION: Pt verbalizes understanding of education   PATIENT RESPONSE TO TREATMENT/ASSESSMENT OF PROGRESS TOWARD GOALS: Upgraded HEP to standing handout provided to daughter to assit pt with HEP instructed to perofrm HEP 2x day with assistance, gait tng on even surfaces requires use of AD due to pt being blind, VC/TC to negotiate around obstables and thresholds. Pt demonstrates improved safety with transfers following transfer tng, addressed standing balance pt tolerated well without loss of balance. Pt complains of minimal fatigue following tx session 4/10. Skilled PT intervention needed to address pain and swelling, deficits in strength, gait, transfers and balance to improve functional mobility and safety and lower risk for falls or injury.   PLAN FOR NEXT VISIT: Next visit will address gait, balance and strength  DISCHARGE PLANS: POC and Discharge plans discussed pt understands and agrees eventual DC once goals have been met or max HC benefits have been achieved. 8 visits remaining anticipated DC 02/19/2023    Unable to have pt sign note due to computer issues

## 2023-01-25 ENCOUNTER — Encounter: Admit: 2023-01-25 | Discharge: 2023-01-25 | Payer: MEDICARE | Primary: Internal Medicine

## 2023-01-25 NOTE — Home Health (Signed)
SUBJECTIVE: Pt states she is doing well no c/o pain, no changes in medications no falls reported  CAREGIVER INVOLVEMENT/ASSISTANCE NEEDED FOR: Daughter Selinda Eon assist pt as needed  OBJECTIVE: See interventions  PATIENT EDUCATION PROVIDED THIS VISIT: Pt educated in fall prevention strategies   PATIENT RESPONSE TO EDUCATION: Pt verbalizes understanding of education   PATIENT RESPONSE TO TREATMENT/ASSESSMENT OF PROGRESS TOWARD GOALS: Pt instructed in standing strengthening to bilateral LEs pt able to recall exercises requires VCs to improve tech, gait tng on even surfaces w/ white cane TC/VCs to negotiate around obstacles, Ind transfers from multiple surfaces in home,, pt declined gait tng outside. Skilled PT intervention needed to address gait, transfers, stair negotiation and balance to improve functional mobility and safety and lower risk for falls or injury.   PLAN FOR NEXT VISIT: Next visit will address balance, gait on uneven surfaces, stair negotiation  DISCHARGE PLANS: POC and Discharge plans discussed pt understands and agrees eventual DC once goals have been met or max HC benefits have been achieved. 7 visits remaining anticipated DC 02/19/2023

## 2023-01-26 ENCOUNTER — Encounter: Payer: BLUE CROSS/BLUE SHIELD | Primary: Internal Medicine

## 2023-01-30 ENCOUNTER — Encounter: Admit: 2023-01-30 | Discharge: 2023-01-30 | Payer: MEDICARE | Primary: Internal Medicine

## 2023-02-02 ENCOUNTER — Encounter: Payer: BLUE CROSS/BLUE SHIELD | Primary: Internal Medicine

## 2023-02-02 NOTE — Unmapped (Signed)
I certify that this patient is confined to his/her home and needs intermittent skilled nursing care, physical therapy and/or speech therapy or continues to need occupational therapy.  The patient is under my care, and I have authorized services on this plan of care and will periodically review the plan.  The patient had a face-to face encounter with an allowed provider type on 01/05/23 & 01/11/23 and the encounter was related to the primary reason for home health care.

## 2023-02-02 NOTE — Unmapped (Signed)
Code Status: Full Code

## 2023-02-03 ENCOUNTER — Encounter: Payer: BLUE CROSS/BLUE SHIELD | Primary: Internal Medicine

## 2023-02-06 ENCOUNTER — Encounter: Payer: BLUE CROSS/BLUE SHIELD | Primary: Internal Medicine

## 2023-02-06 ENCOUNTER — Encounter: Admit: 2023-02-06 | Discharge: 2023-02-06 | Payer: MEDICARE | Primary: Internal Medicine

## 2023-02-06 NOTE — Home Health (Signed)
SUBJECTIVE: Pt doing well no c/o pain, no falls reported, no changes in medications   CAREGIVER INVOLVEMENT/ASSISTANCE NEEDED FOR: Daughter assist pt as needed   OBJECTIVE: See interventions  PATIENT EDUCATION PROVIDED THIS VISIT: Pt educated on fall prevention strategies pain management and s/s of infection.   PATIENT RESPONSE TO EDUCATION: Pt verbalizes understanding of education   PATIENT RESPONSE TO TREATMENT/ASSESSMENT OF PROGRESS TOWARD GOALS: Gait tng on uneven surfaces w/cane and assist needed pt tolerated increaed amb dist, stair negotiation to exit/enter home w/single rail assist needed for safety. Pt instructed in seated strenghtening to bilateral LEs demonstrates improved tech and . Pt progressing well toward established goals. Skilled PT intervention needed to address strength, gait and functional mobility to lower risk for falls or injury.   PLAN FOR NEXT VISIT: Next visit will address balance, strength and gait  DISCHARGE PLANS: POC and Discharge plans discussed pt understands and agrees eventual DC once goals have been met or max HC benefits have been achieved. 4 visits remaining anticipated DC 02/19/2023

## 2023-02-07 ENCOUNTER — Encounter: Payer: BLUE CROSS/BLUE SHIELD | Primary: Internal Medicine

## 2023-02-07 ENCOUNTER — Ambulatory Visit
Admit: 2023-02-07 | Discharge: 2023-02-07 | Payer: BLUE CROSS/BLUE SHIELD | Attending: Internal Medicine | Primary: Internal Medicine

## 2023-02-07 DIAGNOSIS — I10 Essential (primary) hypertension: Secondary | ICD-10-CM

## 2023-02-07 MED ORDER — MECLIZINE HCL 25 MG PO TABS
25 | ORAL_TABLET | Freq: Four times a day (QID) | ORAL | 0 refills | Status: DC | PRN
Start: 2023-02-07 — End: 2024-12-16

## 2023-02-07 MED ORDER — FENOFIBRATE 145 MG PO TABS
145 MG | ORAL_TABLET | Freq: Every day | ORAL | 2 refills | Status: AC
Start: 2023-02-07 — End: 2023-05-02

## 2023-02-07 MED ORDER — DONEPEZIL HCL 10 MG PO TABS
10 MG | ORAL_TABLET | Freq: Every evening | ORAL | 5 refills | Status: AC
Start: 2023-02-07 — End: 2023-07-17

## 2023-02-07 NOTE — Progress Notes (Signed)
"  Have you been to the ER, urgent care clinic since your last visit?  Hospitalized since your last visit?"    NO    "Have you seen or consulted any other health care providers outside of [place] since your last visit?"    YES - When: approximately 1 months ago.  Where and Why: Neurologist .

## 2023-02-07 NOTE — Progress Notes (Signed)
Emily Henson is a 89 y.o. female presenting today for Follow-up  .     Chief Complaint   Patient presents with    Follow-up       HPI:  Emily Henson presents to the office today for follow up.    Patient has a PMHx of HTN, atrial fibrillation, SSS s/p PPM, blindness, hypertriglyceridemia, dementia.    Patient present today with her daughter.    HTN: Patient is on metoprolol, HCTZ and verapamil.     Atrial fibrillation: She has a diagnosis of SSS s/p pacemaker.  Currently on Eliquis, Lopressor and verapamil.  Follows with cardiology.      Hypertriglyceridemia: Lipid panel in 1/23 showed triglyceride 215, LDL 104.  HbA1c was 5.6%.     Dementia: Patient is following with neurology.  She is experiencing worsening memory loss.  She has mobility issues due to blindness and poor balance..  Her Aricept was increased to 10 mg nightly.  She was also referred to neuropsychology. She if following with home health for PT.     Daughter reports patient has been more tired and drowsy.  She was referred to sleep medicine by neurology and has to schedule an appointment.    She has also noticed onychomycosis in the patient's toenails and would like to see podiatry    Review of Systems   Constitutional:  Negative for activity change, appetite change, chills, diaphoresis, fatigue, fever and unexpected weight change.   HENT:  Negative for congestion, nosebleeds, postnasal drip and rhinorrhea.    Eyes:  Positive for visual disturbance.   Respiratory:  Negative for cough, chest tightness, shortness of breath and wheezing.    Cardiovascular:  Negative for chest pain and leg swelling.   Gastrointestinal:  Negative for abdominal pain, blood in stool, diarrhea, nausea and vomiting.   Endocrine: Negative for polydipsia and polyphagia.   Genitourinary:  Negative for decreased urine volume, dysuria, frequency and pelvic pain.   Musculoskeletal:  Positive for arthralgias and gait problem. Negative for back pain, myalgias and neck pain.    Neurological:  Negative for dizziness, tremors, seizures, syncope, speech difficulty, weakness, numbness and headaches.   Psychiatric/Behavioral:  Positive for sleep disturbance. Negative for agitation and confusion. The patient is not nervous/anxious.         Memory deficits   All other systems reviewed and are negative.        Allergies   Allergen Reactions    Caffeine Palpitations    Other Rash     "Preservatives"       PHQ Screening   PHQ-9 Total Score: 0 (02/07/2023 10:35 AM)       History  Past Medical History:   Diagnosis Date    Arrhythmia     Atrial Fibrillation    Arthritis     CAD (coronary artery disease)     Hypercholesterolemia     hypertriglycerides    Hypertension        Past Surgical History:   Procedure Laterality Date    APPENDECTOMY      BREAST REDUCTION SURGERY  1990    CHOLECYSTECTOMY      COLONOSCOPY      GYN      hysterectomy    PACEMAKER      TOTAL COLECTOMY      TOTAL COLECTOMY         Social History     Socioeconomic History    Marital status: Married     Spouse name: Not on file  Number of children: Not on file    Years of education: Not on file    Highest education level: Not on file   Occupational History    Not on file   Tobacco Use    Smoking status: Former    Smokeless tobacco: Never   Substance and Sexual Activity    Alcohol use: No    Drug use: No    Sexual activity: Not on file   Other Topics Concern    Not on file   Social History Narrative    Not on file     Social Determinants of Health     Financial Resource Strain: Not on file   Food Insecurity: Not on file (02/06/2023)   Transportation Needs: No Transportation Needs (01/16/2023)    OASIS A1250: Transportation     Lack of Transportation (Medical): No     Lack of Transportation (Non-Medical): No     Patient Unable or Declines to Respond: No   Physical Activity: Not on file   Stress: Not on file   Social Connections: Feeling Socially Integrated (01/16/2023)    [place]IS D0700: Social Isolation     Frequency of experiencing  loneliness or isolation: Rarely   Intimate Partner Violence: Not on file   Housing Stability: Not on file       Current Outpatient Medications   Medication Sig Dispense Refill    donepezil (ARICEPT) 10 MG tablet Take 1 tablet by mouth nightly 30 tablet 5    fenofibrate (TRICOR) 145 MG tablet Take 1 tablet by mouth daily 30 tablet 2    meclizine (ANTIVERT) 25 MG tablet Take 1 tablet by mouth every 6 hours as needed for Dizziness 30 tablet 0    apixaban (ELIQUIS) 5 MG TABS tablet Take 1 tablet by mouth 2 times daily 60 tablet 5    metoprolol tartrate (LOPRESSOR) 50 MG tablet Take 1 tablet by mouth 2 times daily 60 tablet 2    metoprolol tartrate (LOPRESSOR) 25 MG tablet Take 1 tablet by mouth 2 times daily 60 tablet 2    verapamil (VERELAN) 240 MG extended release capsule Take 1 capsule by mouth daily 30 capsule 2    polyethyl glycol-propyl glycol 0.4-0.3 % (SYSTANE) 0.4-0.3 % ophthalmic solution Apply 2 drops to eye 2 times daily      calcium carbonate 1500 (600 Ca) MG TABS tablet Take 1 tablet by mouth daily      Cholecalciferol 50 MCG (2000 UT) TABS Take 1 tablet by mouth daily      cyanocobalamin 1000 MCG tablet Take 1 tablet by mouth daily      hydroCHLOROthiazide (HYDRODIURIL) 12.5 MG tablet Take 1 tablet by mouth daily (Patient not taking: Reported on 02/07/2023)       No current facility-administered medications for this visit.       BP 138/86   Pulse 75   Temp 99.6 F (37.6 C) (Temporal)   Resp 16   Ht 1.651 m (5' 5"$ )   Wt 80.3 kg (177 lb)   SpO2 95%   BMI 29.45 kg/m      Physical Exam  Vitals and nursing note reviewed.   Constitutional:       General: She is not in acute distress.     Appearance: Normal appearance. She is obese. She is not ill-appearing, toxic-appearing or diaphoretic.   HENT:      Head: Normocephalic and atraumatic.   Eyes:      General: No scleral icterus.  Extraocular Movements: Extraocular movements intact.      Conjunctiva/sclera: Conjunctivae normal.      Pupils: Pupils are  equal, round, and reactive to light.   Cardiovascular:      Rate and Rhythm: Normal rate and regular rhythm.      Pulses: Normal pulses.      Heart sounds: Normal heart sounds. No murmur heard.  Pulmonary:      Effort: Pulmonary effort is normal. No respiratory distress.      Breath sounds: Normal breath sounds. No wheezing.   Musculoskeletal:         General: Normal range of motion.      Cervical back: Normal range of motion and neck supple.      Right lower leg: No edema.      Left lower leg: No edema.   Skin:     General: Skin is warm and dry.      Comments: Onychomycosis    Neurological:      General: No focal deficit present.      Mental Status: She is alert. Mental status is at baseline.      Cranial Nerves: No cranial nerve deficit.      Gait: Gait abnormal.   Psychiatric:         Mood and Affect: Mood normal.         Behavior: Behavior normal.         Thought Content: Thought content normal.         Judgment: Judgment normal.          No visits with results within 3 Month(s) from this visit.   Latest known visit with results is:   Office Visit on 02/04/2020   Component Date Value Ref Range Status    Color (UA POC) 02/04/2020 Yellow   Final    Clarity (UA POC) 02/04/2020 Cloudy   Final    Glucose, Urine, POC 02/04/2020 Negative  Negative Final    Bilirubin, Urine, POC 02/04/2020 Negative  Negative Final    Ketones, Urine, POC 02/04/2020 Negative  Negative Final    Specific Gravity, Urine, POC 02/04/2020 1.025  1.001 - 1.035 NA Final    Blood (UA POC) 02/04/2020 2+  Negative Final    pH, Urine, POC 02/04/2020 5.0  4.6 - 8.0 NA Final    Protein, Urine, POC 02/04/2020 1+  Negative Final    Urobilinogen, POC 02/04/2020 0.2 mg/dL  0.2 - 1 Final    Nitrite, Urine, POC 02/04/2020 Negative  Negative Final    Leukocyte Esterase, Urine, POC 02/04/2020 3+  Negative Final       No results found for any visits on 02/07/23.    Patient Care Team:  Patient Care Team:  Kizzie Fantasia, MD as PCP - General  Kizzie Fantasia, MD  as PCP - Empaneled Provider  Riddle, Hewitt Shorts, APRN - NP as Nurse Practitioner (Neurology)      Assessment / Plan:    ICD-10-CM    1. Essential (primary) hypertension  I10       2. Mild dementia without behavioral disturbance, psychotic disturbance, mood disturbance, or anxiety, unspecified dementia type ([place])  F03.A0 donepezil (ARICEPT) 10 MG tablet      3. Pure hyperglyceridemia  E78.1 fenofibrate (TRICOR) 145 MG tablet     CBC with Auto Differential     Comprehensive Metabolic Panel     Lipid Panel      4. Atrial fibrillation, unspecified type ([place])  I48.91  5. Primary osteoarthritis involving multiple joints  M15.9       6. Onychomycosis  B35.1 External Referral To Podiatry      7. Dizziness  R42 meclizine (ANTIVERT) 25 MG tablet      8. Chronic fatigue  R53.82 Ferritin     Iron and TIBC     TSH           HTN: BP acceptable.  Continue verapamil, metoprolol and HCTZ.    Onychomycosis: Refer to podiatry.    Hypertriglyceridemia: Continue fenofibrate.     Dementia: Following with neurology.  Dose of donepezil was increased recently.  Awaiting follow-up with neuropsychology     Atrial fibrillation: Following with cardiology.  Continue Eliquis, Lopressor 75 mg every day.      Osteoarthritis of multiple joints: Patient advised to take Tylenol arthritis.    Labs ordered.    Advised to get COVID-19 booster, flu shot and RSV vaccine    Return in about 8 weeks (around 04/04/2023) for Medicare Wellness.     I asked the patient if she  had any questions and answered her  questions.  The patient stated that she understands the treatment plan and agrees with the treatment plan    This document was created with a voice activated dictation system and may contain transcription errors.

## 2023-02-08 ENCOUNTER — Encounter: Admit: 2023-02-08 | Discharge: 2023-02-08 | Payer: BLUE CROSS/BLUE SHIELD | Primary: Internal Medicine

## 2023-02-08 NOTE — Home Health (Signed)
SUBJECTIVE: No c/o pain, no falls no changes in medications    CAREGIVER INVOLVEMENT/ASSISTANCE NEEDED FOR: Daughter/Michelle assist pt with needs   OBJECTIVE: See interventions  PATIENT EDUCATION PROVIDED THIS VISIT: Pt educated on fall prevention strategies and pressure relief   PATIENT RESPONSE TO EDUCATION: Pt verbalizes understanding of education   PATIENT RESPONSE TO TREATMENT/ASSESSMENT OF PROGRESS TOWARD GOALS: Gait tng outside on uneven surfaces/multiple terrain w/cane and HHA pt tolerated increaed ambulation distance, stair negotiation w/ rails, cane requires assist for safety due to vision, Ind transfers from multiple surfaces. Pt performed seated strengthening to bilateal LEs VCs to recall exercises.  Skilled PT intervention needed to address deficits in strength, gait, strength and balance to improve functional mobility and safety and lower risk for falls or injury. Pt is progressing well toward established goals and is on target for anticipated DC   PLAN FOR NEXT VISIT: Next visit will address balance and strength  DISCHARGE PLANS: POC and Discharge plans discussed pt understands and agrees eventual DC once goals have been met or max HC benefits have been achieved. 3 visits remaining anticipated DC  02/19/2023

## 2023-02-09 ENCOUNTER — Encounter: Payer: BLUE CROSS/BLUE SHIELD | Primary: Internal Medicine

## 2023-02-12 ENCOUNTER — Encounter: Payer: BLUE CROSS/BLUE SHIELD | Primary: Internal Medicine

## 2023-02-13 ENCOUNTER — Encounter: Payer: BLUE CROSS/BLUE SHIELD | Primary: Internal Medicine

## 2023-02-13 ENCOUNTER — Encounter: Admit: 2023-02-13 | Discharge: 2023-02-13 | Payer: BLUE CROSS/BLUE SHIELD | Primary: Internal Medicine

## 2023-02-13 NOTE — Home Health (Incomplete)
SUBJECTIVE: No complaints of pain, no falls no changes in medicaions   CAREGIVER INVOLVEMENT/ASSISTANCE NEEDED FOR: Daughter asssit pt as needed   Assessment and Summary of Care:  Patient's current functional status before discharge is as follows  Strength: 5/5 BLE  ROM:  WNL  Bed Mobility: Ind bed mobility  Transfers: Ind transfers from multiple surfaces   Gait/WC mobility: 262ft outside on uneven surfaces w/ cane, 179ft on even surfac es w/ cane occasional HHA  Stairs: HHA/Rails to exit/enter home  Special Tests: TINETTI: 20/28  Recommendations: Continue HEP daily, walk as tolerated

## 2023-02-15 ENCOUNTER — Encounter: Admit: 2023-02-15 | Discharge: 2023-02-15 | Payer: BLUE CROSS/BLUE SHIELD | Primary: Internal Medicine

## 2023-02-15 NOTE — Telephone Encounter (Signed)
Janette from [facility] [employer] called stating that need recent office notes from our office so she can do a pre-authorization before patient appt. Scheduled tomorrow   Fax: [phone]

## 2023-02-15 NOTE — Telephone Encounter (Signed)
Fax has been sent.

## 2023-02-16 ENCOUNTER — Encounter: Payer: BLUE CROSS/BLUE SHIELD | Primary: Internal Medicine

## 2023-02-19 ENCOUNTER — Encounter: Payer: BLUE CROSS/BLUE SHIELD | Primary: Internal Medicine

## 2023-02-20 ENCOUNTER — Encounter: Payer: BLUE CROSS/BLUE SHIELD | Primary: Internal Medicine

## 2023-02-20 ENCOUNTER — Encounter: Admit: 2023-02-20 | Discharge: 2023-02-20 | Payer: BLUE CROSS/BLUE SHIELD | Primary: Internal Medicine

## 2023-02-20 NOTE — Home Health (Incomplete)
During this visit, patient presents with the following clinical findings:    SUBJECTIVE: Patient denies fall since evaluation.  Patient reported he/she is now able to ***.  Patient c/o ***intermittent *** throbbing, sore, nagging, dull achy pain on *** with highest pain level of ***/10 and least pain level of ***/10. Patient manages pain by taking  pain medication as prescribed by MD, application of cold pack for soreness and achy  type  of pain /*** hot  packs for achy, stiffness type of pain on painful area x 20 mins, positioning and relaxation.     CAREGIVER ASSISTANCE: ***    MEDICATIONS RECONCILED AND UPDATED: no changes in medications at this time    MMT: R hip flex ***/5   R hip abd ***/5   R hip add ***/5   R knee flex ***/5  R knee ext ***/5    R ankle DF ***/5  L hip flex ***/5  L hip abd ***/5  L hip add ***/5  L knee flex ***/5  L knee ext ***/5  L ankle DF  ***/5    FTSTS: ***   compared to *** during initial evaluation.    ROM: noted *** degrees on *** with c/o *** at end range of *** compared to *** during initial evaluation.    BALANCE: Patient is ***  risk of falls evidenced by  ***/28 on Tinetti balance and gait test and completed TUG test in *** seconds using *** compared to *** and *** respectively during initial evaluation.      2-MINUTE WALK TEST:  *** ft with  RPE of *** and O2  sat ranges from *** compared to *** ft during initial evaluation.      BED MOBILITY: Patient requires *** assistance / is now indep in bed mobility with *** cues from *** during initial evaluation.      TRANSFERS: Patient requires *** assistance / is now indep in transfers to and from bed, chair, toilet, and car using *** with *** cues from ***during initial evaluation.    GAIT: Patient is now able to ambulate *** indep *** on even and  uneven surfaces using *** with forward lean, decreased *** hip/knee flexion during swing phase, decreased stance phase on ***, unequal step length, decreased cadence, wide BOS compared  to *** during initial evaluation.    STAIRS: Patient able to negotiates *** outside steps  with railing with ***, *** interior steps with railing, non-reciprocal pattern compared to *** during initial evaluation.    ASSESSMENT: Patient has met the goals e stablished at the start of care. Patient d/c to self, family and under supervision of MD. ***  .  PATIENT EDUCATION PROVIDED THIS VISIT TO INCLUDE:   FALL PREVENTION TECHNIQUES: monitor medication that may alter mental status, keeping walking pathways clean and clear of clutter and throw rugs, keeping night lights on for ability to see and easily, good visibility for safe transitioning thresholds and proper use and good compliance of [place] ing AD.  PAIN MANAGEMENT TECHNIQUES: application of cold pack for soreness and achy type of pain /hot packs for achy, stiffness type of pain on painful area x 20 mins, positioning and relaxation.  PRESSURE ULCER PREVENTION TECHNIQUES: proper positioning strategies including frequency of repositioning, prevention of shear and friction, appropriate skin hygiene and protection from moisture.  ENERGY CONSERVATION TECHNIQUES: rest, slow down , pacing, and complete tasks in manageable steps.      PATIENT LEVEL OF UNDERSTANDING OF EDUCATION PROVIDED: Patient  verbalized understanding and able to teach back information provided.    PATIENT RESPONSE TO PROCEDURE PERFORMED: *** (CLOF)  Patient is at set up fro ADLS,     Patient/caregiver instructed to contact MD if medical issue arises. Pt/cg verbalized understanding.  Care coordination done with PTA regarding POC, progress made  and d/c plans.

## 2023-03-05 ENCOUNTER — Encounter

## 2023-03-09 MED ORDER — METOPROLOL TARTRATE 50 MG PO TABS
50 MG | ORAL_TABLET | Freq: Two times a day (BID) | ORAL | 2 refills | Status: DC
Start: 2023-03-09 — End: 2023-06-07

## 2023-03-09 MED ORDER — METOPROLOL TARTRATE 25 MG PO TABS
25 MG | ORAL_TABLET | Freq: Two times a day (BID) | ORAL | 2 refills | Status: DC
Start: 2023-03-09 — End: 2023-06-07

## 2023-03-09 MED ORDER — VERAPAMIL HCL ER 240 MG PO CP24
240 MG | ORAL_CAPSULE | Freq: Every day | ORAL | 2 refills | Status: DC
Start: 2023-03-09 — End: 2023-06-07

## 2023-03-26 ENCOUNTER — Ambulatory Visit: Payer: BLUE CROSS/BLUE SHIELD | Primary: Internal Medicine

## 2023-03-26 ENCOUNTER — Inpatient Hospital Stay: Admit: 2023-03-26 | Payer: BLUE CROSS/BLUE SHIELD | Primary: Internal Medicine

## 2023-03-26 DIAGNOSIS — E781 Pure hyperglyceridemia: Secondary | ICD-10-CM

## 2023-03-26 LAB — CBC WITH AUTO DIFFERENTIAL
Basophils %: 0 % (ref 0–2)
Basophils Absolute: 0 10*3/uL (ref 0.0–0.1)
Eosinophils %: 1 % (ref 0–5)
Eosinophils Absolute: 0.1 10*3/uL (ref 0.0–0.4)
Hematocrit: 46.9 % — ABNORMAL HIGH (ref 35.0–45.0)
Hemoglobin: 15 g/dL (ref 12.0–16.0)
Immature Granulocytes %: 0 % (ref 0.0–0.5)
Immature Granulocytes Absolute: 0 10*3/uL (ref 0.00–0.04)
Lymphocytes %: 26 % (ref 21–52)
Lymphocytes Absolute: 2.2 10*3/uL (ref 0.9–3.6)
MCH: 30.5 PG (ref 24.0–34.0)
MCHC: 32 g/dL (ref 31.0–37.0)
MCV: 95.3 FL (ref 78.0–100.0)
MPV: 11.2 FL (ref 9.2–11.8)
Monocytes %: 8 % (ref 3–10)
Monocytes Absolute: 0.6 10*3/uL (ref 0.05–1.2)
Neutrophils %: 65 % (ref 40–73)
Neutrophils Absolute: 5.4 10*3/uL (ref 1.8–8.0)
Nucleated RBCs: 0 PER 100 WBC
Platelets: 271 10*3/uL (ref 135–420)
RBC: 4.92 M/uL (ref 4.20–5.30)
RDW: 13.2 % (ref 11.6–14.5)
WBC: 8.3 10*3/uL (ref 4.6–13.2)
nRBC: 0 10*3/uL (ref 0.00–0.01)

## 2023-03-26 LAB — COMPREHENSIVE METABOLIC PANEL
ALT: 18 U/L (ref 13–56)
AST: 15 U/L (ref 10–38)
Albumin/Globulin Ratio: 1 (ref 0.8–1.7)
Albumin: 3.5 g/dL (ref 3.4–5.0)
Alk Phosphatase: 56 U/L (ref 45–117)
Anion Gap: 5 mmol/L (ref 3.0–18)
BUN: 11 MG/DL (ref 7.0–18)
Bun/Cre Ratio: 13 (ref 12–20)
CO2: 26 mmol/L (ref 21–32)
Calcium: 10.3 MG/DL — ABNORMAL HIGH (ref 8.5–10.1)
Chloride: 109 mmol/L (ref 100–111)
Creatinine: 0.83 MG/DL (ref 0.6–1.3)
Est, Glom Filt Rate: 68 mL/min/{1.73_m2} (ref >60)
Globulin: 3.6 g/dL (ref 2.0–4.0)
Glucose: 97 mg/dL (ref 74–99)
Potassium: 3.7 mmol/L (ref 3.5–5.5)
Sodium: 140 mmol/L (ref 136–145)
Total Bilirubin: 0.5 MG/DL (ref 0.2–1.0)
Total Protein: 7.1 g/dL (ref 6.4–8.2)

## 2023-03-26 LAB — LIPID PANEL
Chol/HDL Ratio: 3 (ref 0–5.0)
Cholesterol, Total: 164 MG/DL (ref <200)
HDL: 54 MG/DL (ref 40–60)
LDL Calculated: 86.6 MG/DL (ref 0–100)
Triglycerides: 117 MG/DL (ref <150)
VLDL Cholesterol Calculated: 23.4 MG/DL

## 2023-03-26 LAB — TSH: TSH, 3RD GENERATION: 1.71 u[IU]/mL (ref 0.36–3.74)

## 2023-03-26 LAB — IRON AND TIBC
Iron % Saturation: 15 % — ABNORMAL LOW (ref 20–50)
Iron: 60 ug/dL (ref 50–175)
TIBC: 413 ug/dL (ref 250–450)

## 2023-03-26 LAB — FERRITIN: Ferritin: 139 NG/ML (ref 8–388)

## 2023-04-04 ENCOUNTER — Ambulatory Visit
Admit: 2023-04-04 | Discharge: 2023-04-04 | Payer: BLUE CROSS/BLUE SHIELD | Attending: Internal Medicine | Primary: Internal Medicine

## 2023-04-04 DIAGNOSIS — Z0000 Encounter for general adult medical examination without abnormal findings: Secondary | ICD-10-CM

## 2023-04-04 NOTE — Progress Notes (Signed)
Medicare Annual Wellness Visit    Emily Henson is here for Medicare AWV    Assessment & Plan   Medicare annual wellness visit, subsequent  Advanced care planning/counseling discussion  -     DO NOT RESUSCITATE (DNR)    Recommendations for Preventive Services Due: see orders and patient instructions/AVS.  Recommended screening schedule for the next 5-10 years is provided to the patient in written form: see Patient Instructions/AVS.     Return in about 6 months (around 10/04/2023).     Subjective       Patient's complete Health Risk Assessment and screening values have been reviewed and are found in Flowsheets. The following problems were reviewed today and where indicated follow up appointments were made and/or referrals ordered.    Positive Risk Factor Screenings with Interventions:       Cognitive:      Words recalled: 1 Word Recalled     Total Score Interpretation: Abnormal Mini-Cog  Interventions:  Patient has dementia           General HRA Questions:  Select all that apply: (!) Loneliness, New or Increased Fatigue        Loneliness Interventions:  Patient would like a home health aide.      Activity, Diet, and Weight:  On average, how many days per week do you engage in moderate to strenuous exercise (like a brisk walk)?: 0 days  On average, how many minutes do you engage in exercise at this level?: 0 min    Do you eat balanced/healthy meals regularly?: Yes    Body mass index is 28.12 kg/m.      Inactivity Interventions:  She is limited in her ability to exercise.  Did follow with PT/OT        Dentist Screen:  Have you seen the dentist within the past year?: (!) No    Intervention:  Advised to schedule with their dentist    Hearing Screen:  Do you or your family notice any trouble with your hearing that hasn't been managed with hearing aids?: (!) Yes    Interventions:  Patient declines any further evaluation or treatment    Vision Screen:  Do you have difficulty driving, watching TV, or doing any of your  daily activities because of your eyesight?: (!) Yes (blind)  Have you had an eye exam within the past year?: (!) No  No results found.    Interventions:    Patient is legally blind.     ADL's:   Patient reports needing help with:  Select all that apply:  (blind)  Select all that apply: [employer], Housekeeping, Banking/Finances, Shopping, Telephone Use, Presenter, broadcasting, Transportation, Taking Medications (blind)  Interventions:  Patient needs help with multiple ADLs.  Her daughter is her primary caregiver.  She is interested in a home health aide                  Objective   Vitals:    04/04/23 1055   BP: (!) 138/91   Site: Left Upper Arm   Position: Sitting   Cuff Size: Large Adult   Pulse: 79   Resp: 16   Temp: 96.9 F (36.1 C)   TempSrc: Temporal   SpO2: 93%   Weight: 76.7 kg (169 lb)   Height: 1.651 m ( )      Body mass index is 28.12 kg/m.             Allergies   Allergen Reactions  Caffeine Palpitations    Other Rash     "Preservatives"     Prior to Visit Medications    Medication Sig Taking? Authorizing Provider   vitamin E 1000 units capsule Take 1 capsule by mouth daily Yes [provider]   metoprolol tartrate (LOPRESSOR) 50 MG tablet Take 1 tablet by mouth 2 times daily Yes Derrin Currey R, MD   metoprolol tartrate (LOPRESSOR) 25 MG tablet Take 1 tablet by mouth 2 times daily Yes Kipton Skillen R, MD   verapamil (VERELAN) 240 MG extended release capsule Take 1 capsule by mouth daily Yes Karley Pho R, MD   donepezil (ARICEPT) 10 MG tablet Take 1 tablet by mouth nightly Yes Lizmarie Witters R, MD   fenofibrate (TRICOR) 145 MG tablet Take 1 tablet by mouth daily Yes Avaree Gilberti, James Ivanoff, MD   meclizine (ANTIVERT) 25 MG tablet Take 1 tablet by mouth every 6 hours as needed for Dizziness Yes Rodrigo Mcgranahan, James Ivanoff, MD   apixaban (ELIQUIS) 5 MG TABS tablet Take 1 tablet by mouth 2 times daily Yes Anwita Mencer R, MD   polyethyl glycol-propyl glycol 0.4-0.3 % (SYSTANE) 0.4-0.3 % ophthalmic solution Apply 2  drops to eye 2 times daily Yes [provider]   calcium carbonate 1500 (600 Ca) MG TABS tablet Take 1 tablet by mouth daily Yes [provider]   Cholecalciferol 50 MCG (2000 UT) TABS Take 1 tablet by mouth daily Yes Automatic Reconciliation, Ar   cyanocobalamin 1000 MCG tablet Take 1 tablet by mouth daily Yes Automatic Reconciliation, Ar   hydroCHLOROthiazide (HYDRODIURIL) 12.5 MG tablet Take 1 tablet by mouth daily  Patient not taking: Reported on 02/07/2023  Automatic Reconciliation, Ar       CareTeam (Including outside providers/suppliers regularly involved in providing care):   Patient Care Team:  Cleon Gustin, MD as PCP - General  Cleon Gustin, MD as PCP - Empaneled Provider  Riddle, Gerome Apley, APRN - NP as Nurse Practitioner (Neurology)     Reviewed and updated this visit:  Tobacco  Allergies  Meds  Problems  Med Hx  Surg Hx  Soc Hx  Fam Hx

## 2023-04-04 NOTE — Progress Notes (Deleted)
Medicare Annual Wellness Visit    Emily Henson is here for Medicare AWV    Assessment & Plan   {There are no diagnoses linked to this encounter. (Refresh or delete this SmartLink)}  Recommendations for Preventive Services Due: see orders and patient instructions/AVS.  Recommended screening schedule for the next 5-10 years is provided to the patient in written form: see Patient Instructions/AVS.     No follow-ups on file.     Subjective   {OPTIONAL - WILL AUTO-DELETE IF NOT ZOXW:[id number]}    Patient's complete Health Risk Assessment and screening values have been reviewed and are found in Flowsheets. The following problems were reviewed today and where indicated follow up appointments were made and/or referrals ordered.    Positive Risk Factor Screenings with Interventions:       Cognitive:      Words recalled: 1 Word Recalled     Total Score Interpretation: Abnormal Mini-Cog    Interventions:  {Cogntive Interventions:201460083}           General HRA Questions:  Select all that apply: (!) Loneliness, New or Increased Fatigue    Fatigue Interventions:  {Fatigue Interventions:201460090}    Loneliness Interventions:  {Loneliness Interventions:201460090}      Activity, Diet, and Weight:  On average, how many days per week do you engage in moderate to strenuous exercise (like a brisk walk)?: 0 days  On average, how many minutes do you engage in exercise at this level?: 0 min    Do you eat balanced/healthy meals regularly?: Yes    Body mass index is 28.12 kg/m.        Inactivity Interventions:  {Inactivity Interventions:201460115}        Dentist Screen:  Have you seen the dentist within the past year?: (!) No    Intervention:  Advised to schedule with their dentist    Hearing Screen:  Do you or your family notice any trouble with your hearing that hasn't been managed with hearing aids?: (!) Yes    Interventions:  Patient declines any further evaluation or treatment    Vision Screen:  Do you have difficulty driving,  watching TV, or doing any of your daily activities because of your eyesight?: (!) Yes (blind)  Have you had an eye exam within the past year?: (!) No  No results found.    Interventions:   ***     ADL's:   Patient reports needing help with:  Select all that apply:  (blind)  Select all that apply: [employer], Housekeeping, Banking/Finances, Shopping, Telephone Use, Presenter, broadcasting, Transportation, Taking Medications (blind)    Interventions:  {Medicare AWV HRA Interventions with see above/meds/referrals:201460090}      {OPTIONAL- LDCT, CVD, STI Counseling Statements:[phone]}            Objective   Vitals:    04/04/23 1055   BP: (!) 138/91   Site: Left Upper Arm   Position: Sitting   Cuff Size: Large Adult   Pulse: 79   Resp: 16   Temp: 96.9 F (36.1 C)   TempSrc: Temporal   SpO2: 93%   Weight: 76.7 kg (169 lb)   Height: 1.651 m ( )      Body mass index is 28.12 kg/m.      {OPTIONAL - GENERAL PHYSICAL EXAM (WILL AUTO-DELETE IF NOT XBJY):[id number]}       Allergies   Allergen Reactions    Caffeine Palpitations    Other Rash     "Preservatives"  Prior to Visit Medications    Medication Sig Taking? Authorizing Provider   vitamin E 1000 units capsule Take 1 capsule by mouth daily Yes [provider]   metoprolol tartrate (LOPRESSOR) 50 MG tablet Take 1 tablet by mouth 2 times daily Yes Lannette Avellino R, MD   metoprolol tartrate (LOPRESSOR) 25 MG tablet Take 1 tablet by mouth 2 times daily Yes Rossana Molchan R, MD   verapamil (VERELAN) 240 MG extended release capsule Take 1 capsule by mouth daily Yes Kensley Valladares R, MD   donepezil (ARICEPT) 10 MG tablet Take 1 tablet by mouth nightly Yes Daneya Hartgrove R, MD   fenofibrate (TRICOR) 145 MG tablet Take 1 tablet by mouth daily Yes Khalil Belote, James Ivanoff, MD   meclizine (ANTIVERT) 25 MG tablet Take 1 tablet by mouth every 6 hours as needed for Dizziness Yes Jacky Dross, James Ivanoff, MD   apixaban (ELIQUIS) 5 MG TABS tablet Take 1 tablet by mouth 2 times daily Yes Bridgette Wolden,  Sander Remedios R, MD   polyethyl glycol-propyl glycol 0.4-0.3 % (SYSTANE) 0.4-0.3 % ophthalmic solution Apply 2 drops to eye 2 times daily Yes [provider]   calcium carbonate 1500 (600 Ca) MG TABS tablet Take 1 tablet by mouth daily Yes [provider]   Cholecalciferol 50 MCG (2000 UT) TABS Take 1 tablet by mouth daily Yes Automatic Reconciliation, Ar   cyanocobalamin 1000 MCG tablet Take 1 tablet by mouth daily Yes Automatic Reconciliation, Ar   hydroCHLOROthiazide (HYDRODIURIL) 12.5 MG tablet Take 1 tablet by mouth daily  Patient not taking: Reported on 02/07/2023  Automatic Reconciliation, Ar       CareTeam (Including outside providers/suppliers regularly involved in providing care):   Patient Care Team:  Cleon Gustin, MD as PCP - General  Cleon Gustin, MD as PCP - Empaneled Provider  Riddle, Gerome Apley, APRN - NP as Nurse Practitioner (Neurology)     Reviewed and updated this visit:  Tobacco  Allergies  Meds  Med Hx  Surg Hx  Soc Hx  Fam Hx

## 2023-04-04 NOTE — Progress Notes (Signed)
"  Have you been to the ER, urgent care clinic since your last visit?  Hospitalized since your last visit?"    NO    "Have you seen or consulted any other health care providers outside of [facility] since your last visit?"    NO            Click Here for Release of Records Request

## 2023-04-04 NOTE — Telephone Encounter (Signed)
TC-call was made. Spoke with [name] (pt's daughter) [name] was advised that form and letter is at front desk for pick up. Pt's daughter [name] with understanding.

## 2023-04-04 NOTE — Patient Instructions (Signed)
Preventing Falls: Care Instructions  Injuries and health problems such as trouble walking or poor eyesight can increase your risk of falling. So can some medicines. But there are things you can do to help prevent falls. You can exercise to get stronger. You can also arrange your home to make it safer.    Talk to your doctor about the medicines you take. Ask if any of them increase the risk of falls and whether they can be changed or stopped.   Try to exercise regularly. It can help improve your strength and balance. This can help lower your risk of falling.     Practice fall safety and prevention.    Wear low-heeled shoes that fit well and give your feet good support. Talk to your doctor if you have foot problems that make this hard.  Carry a cellphone or wear a medical alert device that you can use to call for help.  Use stepladders instead of chairs to reach high objects. Don't climb if you're at risk for falls. Ask for help, if needed.  Wear the correct eyeglasses, if you need them.    Make your home safer.    Remove rugs, cords, clutter, and furniture from walkways.  Keep your house well lit. Use night-lights in hallways and bathrooms.  Install and use sturdy handrails on stairways.  Wear nonskid footwear, even inside. Don't walk barefoot or in socks without shoes.    Be safe outside.    Use handrails, curb cuts, and ramps whenever possible.  Keep your hands free by using a shoulder bag or backpack.  Try to walk in well-lit areas. Watch out for uneven ground, changes in pavement, and debris.  Be careful in the winter. Walk on the grass or gravel when sidewalks are slippery. Use de-icer on steps and walkways. Add non-slip devices to shoes.    Put grab bars and nonskid mats in your shower or tub and near the toilet. Try to use a shower chair or bath bench when bathing.   Get into a tub or shower by putting in your weaker leg first. Get out with your strong side first. Have a phone or medical alert device in  the bathroom with you.   Where can you learn more?  Go to https://www.bennett.info/ and enter G117 to learn more about "Preventing Falls: Care Instructions."  Current as of: July 17, 2023Content Version: 14.0   2006-2024 [employer].   Care instructions adapted under license by [facility] If you have questions about a medical condition or this instruction, always ask your healthcare professional. [place] any warranty or liability for your use of this information.           A Healthy Heart: Care Instructions  Overview     Coronary artery disease, also called heart disease, occurs when a substance called plaque builds up in the vessels that supply oxygen-rich blood to your heart muscle. This can narrow the blood vessels and reduce blood flow. A heart attack happens when blood flow is completely blocked. A high-fat diet, smoking, and other factors increase the risk of heart disease.  Your doctor has found that you have a chance of having heart disease. A heart-healthy lifestyle can help keep your heart healthy and prevent heart disease. This lifestyle includes eating healthy, being active, staying at a weight that's healthy for you, and not smoking or using tobacco. It also includes taking medicines as directed, managing other health conditions, and trying to get a  healthy amount of sleep.  Follow-up care is a key part of your treatment and safety. Be sure to make and go to all appointments, and call your doctor if you are having problems. It's also a good idea to know your test results and keep a list of the medicines you take.  How can you care for yourself at home?  Diet   Use less salt when you cook and eat. This helps lower your blood pressure. Taste food before salting. Add only a little salt when you think you need it. With time, your taste buds will adjust to less salt.    Eat fewer snack items, fast foods, canned soups, and other  high-salt, high-fat, processed foods.    Read food labels and try to avoid saturated and trans fats. They increase your risk of heart disease by raising cholesterol levels.    Limit the amount of solid fat--butter, margarine, and shortening--you eat. Use olive, peanut, or canola oil when you cook. Bake, broil, and steam foods instead of frying them.    Eat a variety of fruit and vegetables every day. Dark green, deep orange, red, or yellow fruits and vegetables are especially good for you. Examples include spinach, carrots, peaches, and berries.    Foods high in fiber can reduce your cholesterol and provide important vitamins and minerals. High-fiber foods include whole-grain cereals and breads, oatmeal, beans, brown rice, citrus fruits, and apples.    Eat lean proteins. Heart-healthy proteins include seafood, lean meats and poultry, eggs, beans, peas, nuts, seeds, and soy products.    Limit drinks and foods with added sugar. These include candy, desserts, and soda pop.   Heart-healthy lifestyle   If your doctor recommends it, get more exercise. For many people, walking is a good choice. Or you may want to swim, bike, or do other activities. Bit by bit, increase the time you're active every day. Try for at least 30 minutes on most days of the week.    Try to quit or cut back on using tobacco and other nicotine products. This includes smoking and vaping. If you need help quitting, talk to your doctor about stop-smoking programs and medicines. These can increase your chances of quitting for good. Quitting is one of the most important things you can do to protect your heart. It is never too late to quit. Try to avoid secondhand smoke too.    Stay at a weight that's healthy for you. Talk to your doctor if you need help losing weight.    Try to get 7 to 9 hours of sleep each night.    Limit alcohol to 2 drinks a day for men and 1 drink a day for women. Too much alcohol can cause health problems.    Manage  other health problems such as diabetes, high blood pressure, and high cholesterol. If you think you may have a problem with alcohol or drug use, talk to your doctor.   Medicines   Take your medicines exactly as prescribed. Call your doctor if you think you are having a problem with your medicine.    If your doctor recommends aspirin, take the amount directed each day. Make sure you take aspirin and not another kind of pain reliever, such as acetaminophen (Tylenol).   When should you call for help?   Call 911 if you have symptoms of a heart attack. These may include:   Chest pain or pressure, or a strange feeling in the chest.  Sweating.    Shortness of breath.    Pain, pressure, or a strange feeling in the back, neck, jaw, or upper belly or in one or both shoulders or arms.    Lightheadedness or sudden weakness.    A fast or irregular heartbeat.   After you call 911, the operator may tell you to chew 1 adult-strength or 2 to 4 low-dose aspirin. Wait for an ambulance. Do not try to drive yourself.  Watch closely for changes in your health, and be sure to contact your doctor if you have any problems.  Where can you learn more?  Go to [employer].ca and enter F075 to learn more about "A Healthy Heart: Care Instructions."  Current as of: June 24, 2023Content Version: 14.0   2006-2024 Healthwise, Incorporated.   Care instructions adapted under license by [facility]. If you have questions about a medical condition or this instruction, always ask your healthcare professional. Healthwise, Incorporated disclaims any warranty or liability for your use of this information.      Personalized Preventive Plan for Emily Henson - 04/04/2023  Medicare offers a range of preventive health benefits. Some of the tests and screenings are paid in full while other may be subject to a deductible, co-insurance, and/or copay.    Some of these benefits include a comprehensive review of your  medical history including lifestyle, illnesses that may run in your family, and various assessments and screenings as appropriate.    After reviewing your medical record and screening and assessments performed today your provider may have ordered immunizations, labs, imaging, and/or referrals for you.  A list of these orders (if applicable) as well as your Preventive Care list are included within your After Visit Summary for your review.    Other Preventive Recommendations:    A preventive eye exam performed by an eye specialist is recommended every 1-2 years to screen for glaucoma; cataracts, macular degeneration, and other eye disorders.  A preventive dental visit is recommended every 6 months.  Try to get at least 150 minutes of exercise per week or 10,000 steps per day on a pedometer .  Order or download the FREE "Exercise & Physical Activity: Your Everyday Guide" from [employer]. Call (508)669-4255 or search [employer] online.  You need 1200-1500 mg of calcium and 1000-2000 IU of vitamin D per day. It is possible to meet your calcium requirement with diet alone, but a vitamin D supplement is usually necessary to meet this goal.  When exposed to the sun, use a sunscreen that protects against both UVA and UVB radiation with an SPF of 30 or greater. Reapply every 2 to 3 hours or after sweating, drying off with a towel, or swimming.  Always wear a seat belt when traveling in a car. Always wear a helmet when riding a bicycle or motorcycle.

## 2023-04-04 NOTE — ACP (Advance Care Planning) (Signed)
Advance Care Planning     Advance Care Planning (ACP) Physician/NP/PA Conversation    Date of Conversation: 04/04/2023  Conducted with:  Environmental health practitioner: Next of Kin by law (only applies in absence of above) (name) Emily Henson    Discussed with patient-patient has dementia but was able to identify her healthcare decision-maker who is her daughter.  Patient and daughter both agreed that patient was DNR.    Healthcare Decision Maker:        Click here to Artist of the Healthcare Decision Maker Relationship (ie "Primary")      Care Preferences:    Hospitalization:  "If your health worsens and it becomes clear that your chance of recovery is unlikely, what would be your preference regarding hospitalization?"  The patient would prefer hospitalization.    Ventilation:  "If you were unable to breath on your own and your chance of recovery was unlikely, what would be your preference about the use of a ventilator (breathing machine) if it was available to you?"  The patient would NOT desire the use of a ventilator.    Resuscitation:  "In the event your heart stopped as a result of an underlying serious health condition, would you want attempts made to restart your heart, or would you prefer a natural death?"  No, do NOT attempt to resuscitate.    resuscitation preferences    Conversation Outcomes / Follow-Up Plan:    Reviewed DNR/DNI and patient confirms current DNR status - completed forms on file (place new order if needed)    Length of Voluntary ACP Conversation in minutes:  <16 minutes (Non-Billable)    Cleon Gustin, MD

## 2023-04-30 NOTE — Telephone Encounter (Signed)
Pt's daughter called to report that her mother/pt was disoriented and off balance on Saturday, 04/28/2023.  Pt 's daughter asked pt questions but the pt did not respond with the correct responses.    Pt's daughter is taking her to ED today to get her checked and will call back with results.

## 2023-05-01 ENCOUNTER — Encounter

## 2023-05-02 MED ORDER — FENOFIBRATE 145 MG PO TABS
145 | ORAL_TABLET | Freq: Every day | ORAL | 2 refills | Status: DC
Start: 2023-05-02 — End: 2023-05-08

## 2023-05-08 ENCOUNTER — Encounter

## 2023-05-08 MED ORDER — FENOFIBRATE 145 MG PO TABS
145 MG | ORAL_TABLET | Freq: Every day | ORAL | 2 refills | Status: AC
Start: 2023-05-08 — End: 2023-09-06

## 2023-06-03 ENCOUNTER — Encounter

## 2023-06-07 MED ORDER — METOPROLOL TARTRATE 50 MG PO TABS
50 MG | ORAL_TABLET | Freq: Two times a day (BID) | ORAL | 2 refills | Status: AC
Start: 2023-06-07 — End: 2023-09-05

## 2023-06-07 MED ORDER — METOPROLOL TARTRATE 25 MG PO TABS
25 MG | ORAL_TABLET | Freq: Two times a day (BID) | ORAL | 2 refills | Status: AC
Start: 2023-06-07 — End: 2023-09-05

## 2023-06-07 MED ORDER — VERAPAMIL HCL ER 240 MG PO CP24
240 MG | ORAL_CAPSULE | Freq: Every day | ORAL | 2 refills | Status: AC
Start: 2023-06-07 — End: 2023-09-05

## 2023-06-13 ENCOUNTER — Inpatient Hospital Stay: Admit: 2023-06-13 | Payer: BLUE CROSS/BLUE SHIELD | Primary: Internal Medicine

## 2023-06-13 DIAGNOSIS — R269 Unspecified abnormalities of gait and mobility: Secondary | ICD-10-CM

## 2023-06-13 NOTE — Progress Notes (Signed)
PT DAILY TREATMENT NOTE/NEURO EVAL 11-20    Patient Name: Emily Henson    Date: 06/13/2023    DOB: 1933-05-08  Insurance: Payor: BCBS / Plan: [place] [employer] [place] / Product Type: *No Product type* /      Patient DOB verified yes     Visit #   Current / Total 1 12   Time   In / Out 3:22P 4:07P   Pain   In / Out 0/10 0/10   Subjective Functional Status/Changes: See poc     Treatment Area: Unspecified abnormalities of gait and mobility [R26.9]  History of falling [Z91.81]    Prior Level of Function: has white cane for determining terrain; lives in 1 story home with 4 steps to enter with daughter; enjoys television and listening to audiobooks   Comorbidities: blind, dementia, arthritis, pacemaker     Assessment / key information:  Patient is a 89 year old right-handed female  who presents to therapy with chief complaint of impaired balance with standing. Daughter reports mother takes smaller steps with walking and that her legs have gotten weaker. She had home health PT ending in March but has not been keeping up with exercises Symptoms aggravated with standing >15 min; symptoms reduced with rest. Patient will sleep 18-20 hours a day. Patient presents to therapy ambulating with decreased step length and clearance requiring CGA to min A. Patient able to perform sit to stand transfer without UE support. B LE fair to good overall. Patient able to maintain MSR stance for 3 sec. Daughter reports patient is largely independent for bed mobility. Patient would benefit from skilled outpatient PT to address above mentioned deficits to return to prior level of function, increase independence with ADLs, and improve overall quality of life.    Patient's daughter [name] present for initial evaluation and helped to provide subjective information.     38 min [x] Eval     - untimed          Therapeutic Procedures:  Tx Min Billable or 1:1 Min (if diff from Tx Min) Procedure, Rationale, Specifics   7  [phone] Self Care/Home Management  (timed):  improve patient knowledge and understanding of home safety, activity modification, diagnosis/prognosis, and physical therapy expectations, procedures and progression  to improve patient's ability to progress to PLOF and address remaining functional goals.  (see flow sheet as applicable)     Details if applicable:  HEP review, review of [facility] and [facility] for community resources   Total    7 Total Reminder: MC/BC bill using total billable min of TIMED therapeutic procedures (example: do not include dry needle or estim unattended, both untimed codes, in totals to left)     [x]   Patient Education billed concurrently with other procedures     Other Objective/Functional Measures:     Physical Therapy Evaluation - Neurologic    Posture: []  Poor    []  Fair    []  Good    Describe:       Gait: []  Normal    [x]  Abnormal    Device:    decreased step length; shuffled steps; CGA to min A  Describe:      Strength (MMT):    Hip L (1-5) R (1-5)   Hip Flexion 4- 4   Hip Ext     Hip ABD     Hip ADD     Hip ER 5 5   Hip IR 5 4  Knee L (1-5) R (1-5)   Knee Flexion 4 4+   Knee Extension 5 5   Ankle PF     Ankle DF 5 5   Other       Other test /comments:     Romberg 30 sec  MSR 3 sec    ASSESSMENT/Changes in Function: see poc    Patient will continue to benefit from skilled PT services to modify and progress therapeutic interventions, analyze and address functional mobility deficits, analyze and address ROM deficits, analyze and address strength deficits, analyze and address soft tissue restrictions, analyze and cue for proper movement patterns, analyze and modify for postural abnormalities, analyze and address imbalance/dizziness, and instruct in home and community integration to address functional deficits and attain remaining goals.       [x]   See Plan of Care - for goals and assessment     PLAN  []   Upgrade activities as tolerated     [x]   Continue plan of care  []   Update  interventions per flow sheet       []   Other:_      Reather Converse, PT 06/13/2023  9:07 AM

## 2023-06-13 NOTE — Other (Signed)
[facility] - [facility] PHYSICAL THERAPY  [address] [place], [place] [address] [phone] Fx: [phone]  Plan of Care / Statement of Necessity for Physical Therapy Services     Patient Name: Emily Henson DOB: 04-04-1933   Medical   Diagnosis: Unspecified abnormalities of gait and mobility [R26.9]  History of falling [Z91.81] Treatment Diagnosis: R26.89  Abnormalities of gait and mobility      Onset Date: Aggravated several mon Payor :  Payor: BCBS / Plan: ANTHEM [employer] [place] / Product Type: *No Product type* /    Referral Source: Waldon Reining, MD Start of Care [facility]): 06/13/2023   Prior Hospitalization: See medical history Provider #: [phone]   Prior Level of Function: has white cane for determining terrain; lives in 1 story home with 4 steps to enter with daughter; enjoys television and listening to audiobooks   Comorbidities: blind, dementia, arthritis, pacemaker     Assessment / key information:  Patient is a 89 year old right-handed female  who presents to therapy with chief complaint of impaired balance with standing. Daughter reports mother takes smaller steps with walking and that her legs have gotten weaker. She had home health PT ending in March but has not been keeping up with exercises Symptoms aggravated with standing >15 min; symptoms reduced with rest. Patient will sleep 18-20 hours a day. Patient presents to therapy ambulating with decreased step length and clearance requiring CGA to min A. Patient able to perform sit to stand transfer without UE support. B LE fair to good overall. Patient able to maintain MSR stance for 3 sec. Daughter reports patient is largely independent for bed mobility. Patient would benefit from skilled outpatient PT to address above mentioned deficits to return to prior level of function, increase independence with ADLs, and improve overall quality of life.    Patient's daughter [name] present for initial evaluation and  helped to provide subjective information.     Evaluation Complexity:  History:  HIGH Complexity :3+ comorbidities / personal factors will impact the outcome/ POC ; Examination:  MEDIUM Complexity : 3 Standardized tests and measures addressin body structure, function, activity limitation and / or participation in recreation  ;Presentation:  MEDIUM Complexity : Evolving with changing characteristics  ;Clinical Decision Making: MEDIUM Complexity Based on clinical judgement  Overall Complexity Rating: MEDIUM  Problem List: pain affecting function, decrease strength, impaired gait/balance, decrease ADL/functional abilities, decrease activity tolerance, decrease flexibility/joint mobility, and decrease transfer abilities    Treatment Plan may include any combination of the following: 95621 Therapeutic Exercise, 97112 Neuromuscular Re-Education, 97140 Manual Therapy, 97530 Therapeutic Activity, 97535 Self Care/Home Management, 97014 Electrical Stim unattended, and 97116 Gait Training  Patient / Family readiness to learn indicated by: asking questions, trying to perform skills, interest, return verbalization , and return demonstration   Persons(s) to be included in education: patient (P)  Barriers to Learning/Limitations: sensory deficits-vision/hearing/speech  Measures taken if barriers to learning present: increased physical assistance as needed  Patient Goal (s): "to do more exercise and stand longer"  Patient Self Reported Health Status: good  Rehabilitation Potential: fair    Short Term Goals: To be accomplished in 3 weeks  Patient/caregiver will report compliance with initial HEP to optimize therapy outcomes.  Status at evaluation: established    Long Term Goals: To be accomplished in 4 weeks  Patient will be able to maintain MSR stance for at least 20 sec B in order to perform self care tasks with increased ease.  Status at  evaluation: 3 sec  2. Patient/family will report/demonstrate standing tolerance of at least 30  min in order to complete household management tasks with increased ease.   Status at evaluation: 15 min  3. Patient will report at least 50% improvement with endurance in order to perform household management and functional tasks with increased ease.   Status at evaluation: new goal    Frequency / Duration: Patient to be seen 2-3 times per week for 4 weeks    Patient/ Caregiver education and instruction: Diagnosis, prognosis, self care, activity modification, and exercises [x]   Plan of care has been reviewed with PTA      Reather Converse, PT       06/13/2023       9:07 AM  ===================================================================  I certify that the above Therapy Services are being furnished while the patient is under my care. I agree with the treatment plan and certify that this therapy is necessary.    Physician's Signature:_________________________   DATE:_________   TIME:________                           Waldon Reining, MD    ** Signature, Date and Time must be completed for valid certification **  Please sign and return to [facility] or you may fax the signed copy to [phone].  Thank you.

## 2023-07-03 ENCOUNTER — Inpatient Hospital Stay: Admit: 2023-07-03 | Payer: MEDICARE | Primary: Internal Medicine

## 2023-07-03 DIAGNOSIS — R269 Unspecified abnormalities of gait and mobility: Secondary | ICD-10-CM

## 2023-07-03 NOTE — Progress Notes (Signed)
PHYSICAL / OCCUPATIONAL THERAPY - DAILY TREATMENT NOTE    Patient Name: Emily Henson    Date: 07/03/2023    DOB: Dec 03, 1933  Insurance: Payor: BCBS MEDICARE / Plan: [employer] [facility] [employer] [facility] FFS / Product Type: *No Product type* /      Patient DOB verified yes   Visit #   Current / Total 2 12   Time   In / Out 12:02P 12:42P   Pain   In / Out 0/10 0/10   Subjective Functional Status/Changes: Patient reports things are about the same. She reports she has not been getting up much as it has been hot.      TREATMENT AREA =  Other abnormalities of gait and mobility [R26.89]     OBJECTIVE      Therapeutic Procedures:    Tx Min Billable or 1:1 Min (if diff from Tx Min) Procedure, Rationale, Specifics   40  97110 Therapeutic Exercise (timed):  increase ROM, strength, coordination, balance, and proprioception to improve patient's ability to progress to PLOF and address remaining functional goals. (see flow sheet as applicable)     Details if applicable:       40  MC BC Totals Reminder: bill using total billable min of TIMED therapeutic procedures (example: do not include dry needle or estim unattended, both untimed codes, in totals to left)  8-22 min = 1 unit; 23-37 min = 2 units; 38-52 min = 3 units; 53-67 min = 4 units; 68-82 min = 5 units   Total Total     [x]   Patient Education billed concurrently with other procedures   [x]  Review HEP    []  Progressed/Changed HEP, detail:    []  Other detail:       Objective Information/Functional Measures/Assessment        Reviewed importance of HEP compliance due to limited authorized insurance visits and to optimize therapy outcomes  Ease reported with supine hip abd  Trialed exercises for time due to patient inconsistencies with counting  Limited left knee ext in sitting   Reviewed daughter setting timer and calling out exercises for pt do to for period of time    Initiated activities per flow sheet/POC. Patient with good overall tolerance tolerance to session. She reports  continued limited upright activity at home and HEP compliance. Will continue to progress as able with plan to incorporate more standing into therapy program and progress HEP with seated activities (for patient safety).    Patient will continue to benefit from skilled PT / OT services to modify and progress therapeutic interventions, analyze and address functional mobility deficits, analyze and address ROM deficits, analyze and address strength deficits, analyze and address soft tissue restrictions, analyze and cue for proper movement patterns, analyze and modify for postural abnormalities, analyze and address imbalance/dizziness, and instruct in home and community integration to address functional deficits and attain remaining goals.    Progress toward goals / Updated goals:  []   See Progress Note/Recertification    Short Term Goals: To be accomplished in 3 weeks  Patient/caregiver will report compliance with initial HEP to optimize therapy outcomes.  Status at evaluation: established  Progressing-fair compliance reported 07/03/23     Long Term Goals: To be accomplished in 4 weeks  Patient will be able to maintain MSR stance for at least 20 sec B in order to perform self care tasks with increased ease.  Status at evaluation: 3 sec  2. Patient/family will report/demonstrate standing tolerance of at least  30 min in order to complete household management tasks with increased ease.              Status at evaluation: 15 min  3. Patient will report at least 50% improvement with endurance in order to perform household management and functional tasks with increased ease.              Status at evaluation: new goal       Next PN/ RC due 07/12/23  Auth due (visit number/ date) 6v exp 08/11/23    PLAN  - Continue Plan of Care  - Upgrade activities as tolerated    Reather Converse, [place]    07/03/2023    10:27 AM    Future Appointments   Date Time Provider Department Center   07/03/2023 12:00 PM Hudgins Estanislado Pandy, [facility]  [facility]   07/04/2023  3:00 PM Florestine Avers, DO BS[facility] BS [facility]   07/17/2023 10:00 AM Maurine Simmering, APRN - NP [facility] NEUR BS AMB   07/19/2023 11:40 AM Saand, James Ivanoff, MD ABMA-MO BS AMB

## 2023-07-04 ENCOUNTER — Encounter: Admit: 2023-07-04 | Discharge: 2023-07-04 | Payer: MEDICARE | Attending: Otolaryngology | Primary: Internal Medicine

## 2023-07-04 DIAGNOSIS — R29818 Other symptoms and signs involving the nervous system: Secondary | ICD-10-CM

## 2023-07-04 NOTE — Assessment & Plan Note (Signed)
Monitored by specialist- no acute findings meriting change in the plan

## 2023-07-04 NOTE — Progress Notes (Signed)
Dariane Wilms presents today for   Chief Complaint   Patient presents with    New Patient     Referred by NP Antonieta Iba for fatigue, snoring. No prior sleep studies       Is someone accompanying this pt? Daughter    Is the patient using any DME equipment during OV? No    -DME Company NA    Depression Screening:        04/04/2023    10:50 AM   PHQ-9 Questionaire   Little interest or pleasure in doing things 0   Feeling down, depressed, or hopeless 0   PHQ-9 Total Score 0       Learning Needs Questionnaire:     No question data found.      Fall Risk:         04/04/2023    10:50 AM 02/07/2023    10:35 AM 05/10/2022     1:00 PM   Fall Risk   2 or more falls in past year? no no no   Fall with injury in past year? no no yes        Abuse Screening:         05/10/2022    12:00 PM   AMB Abuse Screening   Do you ever feel afraid of your partner? N   Are you in a relationship with someone who physically or mentally threatens you? N   Is it safe for you to go home? Y         Coordination of Care:    1. Have you been to the ER, urgent care clinic since your last visit? Hospitalized since your last visit? No    2. Have you seen or consulted any other health care providers outside of the [facility] since your last visit? Include any pap smears or colon screening. Cardiology, Podiatry    Medication list has been update per patient.

## 2023-07-04 NOTE — Patient Instructions (Signed)
Please make a follow up appointment to discuss the results of your sleep study. If this is impossible for some reason, please send me a "My Chart" message so that I may get back with you in a timely manner.    The [facility] is located in the [facility], adjacent to [facility]. The lab is on the second floor. The direct number to call for sleep study related questions is: [phone].    Please call our clinic back at [phone] or send a message on MyChart if you have any questions or concerns or if you are experiencing any of the following:     You have not received a follow up appointment within 30 days prior the recommended follow up time.    If you are not tolerating treatment plan and/or not able to obtain equipment or prescribed medication(s).  if you are experiencing any difficulties with the Durable Medical Equipment  (DME) Company you may be using or is assigned to you.  Two weeks have passed and you have not received an appointment for a scheduled procedure.  Two weeks have passed since you underwent a test and/or procedure and you have not received your results.     If you are using a CPAP/BIPAP, or Home Ventilator Device- Please note the following.  Currently, many DMEs are experiencing supply chain difficulties and orders for equipment may be back logged several weeks.     Your  Durable Medical Equipment (DME ) company is supposed to provide you with replacement filters, tubing and masks. You can either call your DME when you need new supplies or you can arrange for an automatic shipment schedule.    Your need to be seen by our office at lat minimum of every 12 months in order to renew the prescription for these supplies.   Please make note of who your DME company is and their phone number.   Please make sure that you clean your mask and hosing on a regular basis.  Your DME can provide you with additional information regarding proper care and cleaning of your  device    Driver Safety is strongly encouraged.  You should not drive if sleepy, tired, distracted and/or fatigued.      Chest Xrays and blood work do not require appointments.  They are considered "Walk-In" services and can be obtained at either [place] or [facility].  Blood work is performed at the Laboratory (Lab) from 08:00am - 04:00 pm (if applicable to your visit)

## 2023-07-04 NOTE — Progress Notes (Signed)
[facility]   Sleep Medicine     Office Progress Note - Initial Evaluation        [address]  [address]  [place] [address]  [phone]  [phone] Fax    Reason for visit/referral: Evaluation for possible obstructive sleep apnea/sleep disordered breathing  Assessment:      1. Suspected sleep apnea  -     PAT - Home Sleep Test; Future  2. Snoring  -     PAT - Home Sleep Test; Future  3. Excessive daytime sleepiness  -     PAT - Home Sleep Test; Future  4. Mild vascular dementia without behavioral disturbance, psychotic disturbance, mood disturbance, or anxiety (HCC)  Assessment & Plan:   Monitored by specialist- no acute findings meriting change in the plan  5. Pacemaker  Assessment & Plan:   Monitored by specialist- no acute findings meriting change in the plan  6. Primary hypertension  7. Blind in both eyes  8. Atrial fibrillation, unspecified type [facility])  Assessment & Plan:   Monitored by specialist- no acute findings meriting change in the plan  9. Hypersomnia, unspecified  -     PAT - Home Sleep Test; Future     The patient's daughter accompanied her mother to the appointment and did most of the talking during the visit.  The main complaint seems to be excessive daytime somnolence and that her mother is "sleeping all day".  This is been going on for maybe a little over a year.    What we discussed today is the fact that she certainly could have obstructive sleep apnea but also concerned about some degree of central sleep apnea due to her cardiac history.    We also discussed the fact that excessive daytime somnolence could be caused or made worse by some of the patient's medications, being blind and age as well as dementia, in general.    Certainly there is a medication for patients that are totally blind in terms of circadian rhythm management but the patient can sleep no matter what time of day it is so not sure how helpful that would be.  I think the first step would be  to evaluate for sleep disordered breathing or central sleep apnea and treat that and see how she does overall.    I have discussed the nature and definition of obstructive sleep apnea and why it would be important to evaluate for this.  We did discuss how undiagnosed/untreated obstructive sleep apnea can affect other medical conditions/disorders, and in particular, cardiovascular disorders.  The consequences of untreated obstructive sleep apnea include the increased risk of stroke, heart disease, hypertension, cognitive difficulties (attention, memory), possible endocrine difficulties to include insulin resistance and possible increased risk of diabetes, increased sleepiness and fatigue and increased risk of motor vehicle accidents.    We did discuss the different kinds of sleep studies and I believe a home sleep apnea test is a reasonable option in this setting.  Certainly would be a lot more difficult to do an in lab study at this juncture.  If we need to do a follow and titration study for some reason then the daughter and I will discuss it.    Patient asked appropriate questions and would like to move forward with obtaining desired study.      Plan:   I will order home sleep apnea test and call the daughter with the results as we discussed today.  We discussed that mild obstructive sleep apnea likely would not be something that all parties might be willing to address.  However, moderate to severe obstructive sleep apnea and certainly central sleep apnea would certainly be something that would all be interested in treating to see if her excessive daytime somnolence improves.    This information was analyzed to assess complexity and medical decision making in regards to further testing and management.      Orders Placed This Encounter   Procedures    PAT - Home Sleep Test     Patient is totally blind and has dementia, daughter has power of attorney     Standing Status:   Future     Standing Expiration Date:    07/03/2024     Scheduling Instructions:      89 year old female with a history of snoring, excessive daytime somnolence, hypertension and A-fib.  Epworth sleep score is 18.  Ordering home sleep apnea test to evaluate for suspected obstructive sleep apnea.     Order Specific Question:   Location For Sleep Study     Answer:   [facility]      Potential consequences of untreated sleep apnea, and/or excessive daytime sleepiness were discussed with the patient.  Educational materials provided.  Treatment options including CPAP, dental appliance, weight reduction measures, positional therapy, surgeries etc were at least briefly discussed.  Healthy lifestyle changes to include weight loss and exercise discussed.  Healthy sleep habits were reviewed and encouraged.  Driver and workplace safety reviewed and discussed as appropriate. Drowsy and/or inattentive driving should be avoided.  Follow up with Primary Care Provider (PCP) as directed and for routine health care maintenance.  Follow-up: 1-2 months (after sleep study complete), sooner should new symptoms or problems arise.  Thank you for allowing me to participate in the care of your patient!  Subjective:     Patient ID: Emily Henson is a 89 y.o. female.    Chief Complaint   Patient presents with    New Patient     Referred by NP Antonieta Iba for fatigue, snoring. No prior sleep studies     HPI:      Emily Henson is a 89 y.o. female referred by Maurine Simmering, APRN -* for a sleep evaluation. She complains of: Sleeping all day.    The patient's daughter moved in with her sometime in the past year.  The patient's husband died last year and the daughter moved in and now cares for her mother into her home.    The daughter says that over the past year her mother seems to be sleeping "all the time".    She dozes off in her chair by 7:30 PM and sleeps for several hours.  She does not snore when sitting in her chair.  The daughter wakes her up and moves her to her bed  sometime between 9 and 10 PM and she has no trouble falling back asleep.  She can be heard to snore and on some nights he can be fairly loud.  The daughter can hear her mom snoring across the hall through a closed door.  The patient denies waking up choking or gasping for air but does wake up coughing sometimes.    The patient does not have many complaints about her sleep overall.    Patient's daughter says that she does "call out" in her sleep sometimes and sometimes the patient remembers and other times she does not.  There are  no reports of acting out dreams    Previous sleep evaluation and treatment has included- None    DOT/CDL - No  FAA/Pilot's license -No    Previous Report(s) Reviewed: historical medical records, office notes, and referral letter(s). Pertinent data has been documented.    Sleep symptoms:  Yes No  Additional Comments   Snoring [x]  []     Witness Apneas []  [x]     Waking snorting/gasping []  [x]     Nocturia []     [x]     Legs kicking at night []  [x]     AM Headaches []  [x]     Non-restorative sleep []  [x]     Daytime sleepiness/fatigue [x]  []     Daytime napping [x]  []     Drowsiness while driving []  []  Does not drive   Memory issues [x]  []  Has dementia   Decreased concentration []  []  ?   Cataplexy []  [x]     Hypnogogic hallucinations []  [x]     Sleep paralysis []  [x]     Bruxism []  [x]     "Clock Watching" []  [x]     Other []  []             07/04/2023     3:05 PM   Sleep Questionnaire   Sitting and reading 3   Watching TV 3   Sitting, inactive in a public place (e.g. a theatre or a meeting) 3   As a passenger in a car for an hour without a break 3   Lying down to rest in the afternoon when circumstances permit 3   Sitting and talking to someone 0   Sitting quietly after a lunch without alcohol 3   In a car, while stopped for a few minutes in traffic 0   Epworth Sleepiness Score 18       Occupation/work hours:  N/A    Caffeine Intake - None.    Social History     Socioeconomic History    Marital status:  Widowed     Spouse name: Not on file    Number of children: Not on file    Years of education: Not on file    Highest education level: Not on file   Occupational History    Not on file   Tobacco Use    Smoking status: Former    Smokeless tobacco: Never   Substance and Sexual Activity    Alcohol use: No    Drug use: No    Sexual activity: Defer   Other Topics Concern    Not on file   Social History Narrative    Not on file     Social Determinants of Health     Financial Resource Strain: Low Risk  (04/04/2023)    Overall Financial Resource Strain (CARDIA)     Difficulty of Paying Living Expenses: Not hard at all   Food Insecurity: No Food Insecurity (04/04/2023)    Hunger Vital Sign     Worried About Running Out of Food in the Last Year: Never true     Ran Out of Food in the Last Year: Never true   Transportation Needs: Unknown (04/04/2023)    PRAPARE - Therapist, art (Medical): Not on file     Lack of Transportation (Non-Medical): No   Physical Activity: Inactive (04/04/2023)    Exercise Vital Sign     Days of Exercise per Week: 0 days     Minutes of Exercise per Session: 0 min   Stress: Not on file  Social Connections: Feeling Socially Integrated (02/20/2023)    OASIS D0700: Social Isolation     Frequency of experiencing loneliness or isolation: Rarely   Intimate Partner Violence: Not on file   Housing Stability: Unknown (04/04/2023)    Housing Stability Vital Sign     Unable to Pay for Housing in the Last Year: Not on file     Number of Places Lived in the Last Year: Not on file     Unstable Housing in the Last Year: No        Current Outpatient Medications   Medication Instructions    apixaban (ELIQUIS) 5 mg, Oral, 2 TIMES DAILY    calcium carbonate 1500 (600 Ca) MG TABS tablet 1 tablet, Oral, DAILY    Cholecalciferol 2,000 Units, Oral, DAILY    cyanocobalamin 1,000 mcg, Oral, DAILY    donepezil (ARICEPT) 10 mg, Oral, NIGHTLY    fenofibrate (TRICOR) 145 mg, Oral, DAILY    meclizine (ANTIVERT)  25 mg, Oral, EVERY 6 HOURS PRN    metoprolol tartrate (LOPRESSOR) 50 mg, Oral, 2 TIMES DAILY    metoprolol tartrate (LOPRESSOR) 25 mg, Oral, 2 TIMES DAILY    polyethyl glycol-propyl glycol 0.4-0.3 % (SYSTANE) 0.4-0.3 % ophthalmic solution 2 drops, Ophthalmic, 2 TIMES DAILY    verapamil (VERELAN) 240 mg, Oral, DAILY    vitamin E 1,000 Units, Oral, DAILY       Allergies as of 07/04/2023 - Fully Reviewed 07/04/2023   Allergen Reaction Noted    Caffeine Palpitations 06/27/2014    Other Rash 06/27/2014       Patient Active Problem List   Diagnosis    Pacemaker    Hypertension    Hypertriglyceridemia    Atrial fibrillation (HCC)    Blind in both eyes    Mild vascular dementia without behavioral disturbance, psychotic disturbance, mood disturbance, or anxiety (HCC)       Past Medical History:   Diagnosis Date    Arrhythmia     Atrial Fibrillation    Arthritis     CAD (coronary artery disease)     Hypercholesterolemia     hypertriglycerides    Hypertension        Past Surgical History:   Procedure Laterality Date    APPENDECTOMY      BREAST REDUCTION SURGERY  1990    CHOLECYSTECTOMY      COLONOSCOPY      GYN      hysterectomy    PACEMAKER      TOTAL COLECTOMY      TOTAL COLECTOMY         Family History   Problem Relation Age of Onset    Diabetes Mother     Hypertension Mother        Review of Systems   Constitutional:  Positive for fatigue. Negative for chills and diaphoresis.   Respiratory:  Positive for shortness of breath (With walking/exertion). Negative for choking and chest tightness.    Psychiatric/Behavioral:  Negative for self-injury and sleep disturbance.        Objective:     Vitals:    Weight BMI   Wt Readings from Last 3 Encounters:   07/04/23 77.7 kg (171 lb 6.4 oz)   04/04/23 76.7 kg (169 lb)   02/07/23 80.3 kg (177 lb)    Body mass index is 28.52 kg/m.     BP HR SaO2   BP Readings from Last 3 Encounters:   07/04/23 (!) 161/92   04/04/23 136/86  02/20/23 134/82    Pulse Readings from Last 3 Encounters:    07/04/23 98   04/04/23 79   02/20/23 64    SpO2 Readings from Last 3 Encounters:   07/04/23 97%   04/04/23 93%   02/20/23 97%        Additional Measurements    07/04/23 1505   Neck (Inches): 18      Physical Exam  Constitutional:       Appearance: Normal appearance.   HENT:      Head: Normocephalic and atraumatic.   Cardiovascular:      Rate and Rhythm: Normal rate.      Heart sounds: No murmur heard.     No gallop.   Pulmonary:      Effort: Pulmonary effort is normal.      Breath sounds: No wheezing or rales.   Neurological:      General: No focal deficit present.      Mental Status: She is alert and oriented to person, place, and time.   Psychiatric:         Mood and Affect: Mood normal.         Behavior: Behavior normal.       Data reviewed:    Hemoglobin A1C   Date Value Ref Range Status   01/03/2022 5.6 4.2 - 5.6 % Final     Comment:     (NOTE)  HbA1C Interpretive Ranges  <5.7              Normal  5.7 - 6.4         Consider Prediabetes  >6.5              Consider Diabetes          Lab Results   Component Value Date/Time    NA 140 03/26/2023 10:48 AM    K 3.7 03/26/2023 10:48 AM    CL 109 03/26/2023 10:48 AM    CO2 26 03/26/2023 10:48 AM    BUN 11 03/26/2023 10:48 AM    CREATININE 0.83 03/26/2023 10:48 AM    GLUCOSE 97 03/26/2023 10:48 AM    CALCIUM 10.3 03/26/2023 10:48 AM    LABGLOM 68 03/26/2023 10:48 AM    LABGLOM >60 01/03/2022 10:14 AM         Lab Results   Component Value Date    FERRITIN 139 03/26/2023      Latest Reference Range & Units Most Recent 07/04/21 - 07/03/23   WBC 4.6 - 13.2 K/uL 8.3  03/26/23 10:48   Hemoglobin Quant 12.0 - 16.0 g/dL 16.1  0/9/60 45:40   Hematocrit 35.0 - 45.0 % 46.9 (H)  03/26/23 10:48   Platelet Count 135 - 420 K/uL 271  03/26/23 10:48   (H): Data is abnormally high      Historical Sleep Testing Data: None    Total time spent on this encounter, including previsit review of of separately obtained history, face to face interaction, performing medically appropriate physical exam,  patient/counseling, ordering tests, care coordination and documentation was  32  minutes.  I reviewed referring provider's notes, reviewed recent labs as documented above.  I spent 20 minutes or so face-to-face with the patient and her daughter and ordered a home sleep apnea test.    This note was dictated utilizing voice recognition software which may lead to typographical errors.  I apologize in advance if the situation occurs.  If questions arise please do not hesitate to contact me or  call our department.    Electronically signed by Florestine Avers, DO on7/17/2024 at 3:39 PM

## 2023-07-05 NOTE — Telephone Encounter (Signed)
See Telephone Note     

## 2023-07-06 NOTE — Telephone Encounter (Signed)
See Telephone Note     

## 2023-07-09 NOTE — Telephone Encounter (Signed)
See Telephone Note     

## 2023-07-13 ENCOUNTER — Inpatient Hospital Stay: Payer: MEDICARE | Primary: Internal Medicine

## 2023-07-17 ENCOUNTER — Ambulatory Visit: Admit: 2023-07-17 | Discharge: 2023-07-17 | Payer: MEDICARE | Attending: Acute Care | Primary: Internal Medicine

## 2023-07-17 DIAGNOSIS — F03A0 Unspecified dementia, mild, without behavioral disturbance, psychotic disturbance, mood disturbance, and anxiety: Secondary | ICD-10-CM

## 2023-07-17 MED ORDER — DONEPEZIL HCL 10 MG PO TABS
10 | ORAL_TABLET | Freq: Every evening | ORAL | 5 refills | Status: AC
Start: 2023-07-17 — End: ?

## 2023-07-17 NOTE — Patient Instructions (Signed)
Patient instructions:  -call regarding neuropsychology appointment: [facility]- [phone]  Let me know if new referral is needed or if we need to go with another office.

## 2023-07-17 NOTE — Progress Notes (Signed)
[name]  [address] [place], [place], [place] [address]  Office:  [phone]  Fax: [phone]  Chief Complaint   Patient presents with    Follow-up     6 month follow up         HPI: Emily Henson presents in follow-up for dementia.  She was last seen here on 01/11/2023.  CT head 09/20/2022 reported sequela of chronic microvascular ischemia and global atrophy.  Unfortunately, her husband passed away recently.  Daughter reported she continued to have short term memory impairment.  Does not do much- sleeps a lot.  Sometimes had weird dreams after her husband passed away.  Aricept was increased to 10 mg daily.  She was referred to home health PT for safety and mobility and would like a home care aide.  Neuropsychological evaluation was pending.    She presents today in follow-up.  She is with her daughter.  Patient reports doing well.  Denies complaints.  She had home health PT then is now in outpatient PT.  She saw sleep specialist Dr. Pollie Meyer.  Will have a home sleep study.  Daughter reports cognitive decline.  Daughter and patient did not notice benefit on Aricept.  Daughter reports more confusion when waking up in the morning, I.e. confused about her medications.  Walking is good.  Eating/drinking is good.  Mood is ok.  Daughter notes anger outbursts sometimes; going off when waking up from a nap.        From initial encounter 09/11/2022:  "HPI:  Emily Henson presents in evaluation for dementia.  She has medical history to include hypertension, hypertriglyceridemia, atrial fibrillation, CAD, arthritis, blind in both eyes, and has a pacemaker.     She presents today in evaluation.  She is with her daughter who provides history.  Her husband has dementia and is in hospice now.  Patient's daughter and son care for them.  Patient is blind.  Feels good.  No pain.  Listens to radio and tv.  Used to listen to audio books.  Does ADLs.  Daughter helps with placing her medications out.   Hasn't had trouble with arthritis lately.  Has had a cane.  Blind since the '80s-'90s.  On Eliquis for A. Fib.  Taking vitamin B12.  Aricept 5 mg.  No improvement noticed on Aricept.  No significant headaches.  Vitamin B12 level elevated in January.  TSH normal in January.  Daughter notices worsening short term memory over time.  Long term is good.  Inability to maneuver through the house.  Not too much misplacing items.  Feels good mental health wise.  Admits she gets antsy sometimes.  Nothing significant.  Some yelling.  4 people in the house.  +delusions per daughter, thinking there are people in the house (the kids).  Not bothersome to her per patient but daughter thinks it is.  Has 4 steps into house, does not go upstairs.  Hearing is ok but daughter wonders about it.  PCP referred to ENT due to hearing loss.  No history of stroke, seizures, significant head injury, or LOC.  No tremor.  No urinary incontinence.  Sometimes dysuria if she does not drink enough water.  Some weight gain.  Eating/drinking ok, swallowing ok.  Sleep is good.  No acting out dreams.  Snores, sometimes loud.  When she is supine.  Was cooking up until 5 years ago.  Does not drive.  Was living in [place], [place], [place] before here.  Social history: Worked at [employer], Dispensing optician.  Retired around 1988.  Lives with husband, daughter, son.  No smoking, alcohol, or drug use.  Rare glass of wine.       Family history: Her mother had atherosclerosis.  Sister had sleep apnea.  Half sister has dementia.  Mother had diabetes."    Past Medical History:   Diagnosis Date    Arrhythmia     Atrial Fibrillation    Arthritis     CAD (coronary artery disease)     Hypercholesterolemia     hypertriglycerides    Hypertension        Past Surgical History:   Procedure Laterality Date    APPENDECTOMY      BREAST REDUCTION SURGERY  1990    CHOLECYSTECTOMY      COLONOSCOPY      GYN      hysterectomy    PACEMAKER      TOTAL COLECTOMY      TOTAL COLECTOMY         Current  Outpatient Medications   Medication Sig Dispense Refill    donepezil (ARICEPT) 10 MG tablet Take 1 tablet by mouth nightly 30 tablet 5    metoprolol tartrate (LOPRESSOR) 50 MG tablet take 1 tablet by mouth twice a day 60 tablet 2    metoprolol tartrate (LOPRESSOR) 25 MG tablet take 1 tablet by mouth twice a day 60 tablet 2    verapamil (VERELAN) 240 MG extended release capsule take 1 capsule by mouth once daily 30 capsule 2    fenofibrate (TRICOR) 145 MG tablet Take 1 tablet by mouth daily 30 tablet 2    vitamin E 1000 units capsule Take 1 capsule by mouth daily      meclizine (ANTIVERT) 25 MG tablet Take 1 tablet by mouth every 6 hours as needed for Dizziness 30 tablet 0    apixaban (ELIQUIS) 5 MG TABS tablet Take 1 tablet by mouth 2 times daily 60 tablet 5    polyethyl glycol-propyl glycol 0.4-0.3 % (SYSTANE) 0.4-0.3 % ophthalmic solution Apply 2 drops to eye 2 times daily      calcium carbonate 1500 (600 Ca) MG TABS tablet Take 1 tablet by mouth daily      Cholecalciferol 50 MCG (2000 UT) TABS Take 1 tablet by mouth daily      cyanocobalamin 1000 MCG tablet Take 1 tablet by mouth daily       No current facility-administered medications for this visit.        Allergies   Allergen Reactions    Caffeine Palpitations    Other Rash     "Preservatives"       Social History     Tobacco Use    Smoking status: Former    Smokeless tobacco: Never   Substance Use Topics    Alcohol use: No    Drug use: No       Family History   Problem Relation Age of Onset    Diabetes Mother     Hypertension Mother        Review of Systems:  GENERAL: Denies fever. +fatigue  CARDIAC: No CP or SOB  PULMONARY: No cough or SOB  MUSCULOSKELETAL: No new joint pain  NEURO: SEE HPI    Physical Examination:  BP 136/78   Pulse 55   Temp 97.7 F (36.5 C) (Oral)   Resp 16   Ht 1.651 m (5\' 5" )   Wt 78.7 kg (173 lb 6.4 oz)  SpO2 94%   BMI 28.86 kg/m     Alert, in NAD. Heart is regular. Oriented x3. Oriented to doctor's office, state, month as  June then July, year. Speech is fluent. Speech clear. She is conversational and appropriate. Daughter helped with history. She is blind.  No facial asymmetry. Strength and tone are normal. No drift of the bilateral upper extremities. Ambulates with cane.     Last MMSE 09/11/2022: which was limited due to: patient is blind. Oriented to year, season, date. Month as October then September. Oriented to state, country, hospital, floor. Spelling 'world' backwards: 'DLO". Serial 7s: 93.       Impression/Plan:  Emily Henson is a 89 y.o. female who presents in follow-up for memory loss, possible age related cognitive decline vs MCI vs mild dementia.  Short-term memory difficulties are reported.  CT head showed sequela of chronic microvascular ischemia and global atrophy.  She does ADLs.  She has help with her daughter for medications, finances, cooking, etc.  No mood or safety concerns.  Daughter notes cognitive decline.  Daughter reports lately some confusion after naps, but not very often.  She is going to be having a home sleep study.  They did not notice benefit with Aricept to 10 mg nightly.  Neuropsychology evaluation is pending.  Encouraged to proceed with this if interested.  Encouraged to keep active.  Follow up in 6 months or after neuropsychology evaluation.      Emily Henson was seen today for follow-up.    Diagnoses and all orders for this visit:    Mild dementia without behavioral disturbance, psychotic disturbance, mood disturbance, or anxiety, unspecified dementia type (HCC)  -     donepezil (ARICEPT) 10 MG tablet; Take 1 tablet by mouth nightly      Signed By: Maurine Simmering, APRN - NP        PLEASE NOTE:   Portions of this document may have been produced using voice recognition software. Unrecognized errors in transcription may be present.

## 2023-07-17 NOTE — Progress Notes (Signed)
Emily Henson is a 89 y.o. female (DOB: 01-Oct-1933) presenting to address:    Chief Complaint   Patient presents with    Follow-up     6 month follow up         Vitals:    07/17/23 1027   BP: (!) 146/80   Pulse: 55   Resp: 16   Temp: 97.7 F (36.5 C)   SpO2: 94%       Coordination of Care Questionaire:   1. "Have you been to the ER, urgent care clinic since your last visit?  Hospitalized since your last visit?" no    2. "Have you seen or consulted any other health care providers outside of the [facility] since your last visit?" yes     3. For patients aged 74-75: Has the patient had a colonoscopy / FIT/ Cologuard? na      If the patient is female:    4. For patients aged 68-74: Has the patient had a mammogram within the past 2 years? na      5. For patients aged 21-65: Has the patient had a pap smear? na    Advanced Directive:   1. Do you have an Advanced Directive? no    2. Would you like information on Advanced Directives? no

## 2023-07-19 ENCOUNTER — Ambulatory Visit
Admit: 2023-07-19 | Discharge: 2023-07-19 | Payer: BLUE CROSS/BLUE SHIELD | Attending: Internal Medicine | Primary: Internal Medicine

## 2023-07-19 DIAGNOSIS — I10 Essential (primary) hypertension: Secondary | ICD-10-CM

## 2023-07-19 MED ORDER — APIXABAN 5 MG PO TABS
5 MG | ORAL_TABLET | Freq: Two times a day (BID) | ORAL | 5 refills | Status: DC
Start: 2023-07-19 — End: 2023-09-20

## 2023-07-19 NOTE — Progress Notes (Signed)
"

## 2023-07-19 NOTE — Progress Notes (Signed)
Emily Henson is a 89 y.o. female presenting today for Follow-up  .     Chief Complaint   Patient presents with    Follow-up       HPI:  Emily Henson presents to the office today for follow up.    Patient has a PMHx of HTN, atrial fibrillation, SSS s/p PPM, blindness, hypertriglyceridemia, dementia.    Patient present today with her daughter.    HTN: Patient is on metoprolol, HCTZ and verapamil.  BP is controlled.     Atrial fibrillation: She has a diagnosis of SSS s/p pacemaker.  Currently on Eliquis, Lopressor and verapamil.  Follows with cardiology.      Hypertriglyceridemia: Lipid panel in 03/2023 showed LDL 86.6.Marland Kitchen  HbA1c was 5.6%.     Dementia: Patient is following with neurology.  She is experiencing worsening memory loss.  She has mobility issues due to blindness and poor balance..  Her Aricept was increased to 10 mg nightly.  She was also referred to neuropsychology. She if following with home health for PT.     Daughter reports patient has been more tired and drowsy.  Patient saw sleep medicine and a sleep test was ordered.        Review of Systems   Constitutional:  Negative for activity change, appetite change, chills, diaphoresis, fatigue, fever and unexpected weight change.   HENT:  Negative for congestion, nosebleeds, postnasal drip and rhinorrhea.    Eyes:  Positive for visual disturbance.   Respiratory:  Negative for cough, chest tightness, shortness of breath and wheezing.    Cardiovascular:  Negative for chest pain and leg swelling.   Gastrointestinal:  Negative for abdominal pain, blood in stool, diarrhea, nausea and vomiting.   Endocrine: Negative for polydipsia and polyphagia.   Genitourinary:  Negative for decreased urine volume, dysuria, frequency and pelvic pain.   Musculoskeletal:  Positive for arthralgias and gait problem. Negative for back pain, myalgias and neck pain.   Neurological:  Negative for dizziness, tremors, seizures, syncope, speech difficulty, weakness, numbness and  headaches.   Psychiatric/Behavioral:  Positive for sleep disturbance. Negative for agitation and confusion. The patient is not nervous/anxious.         Memory deficits   All other systems reviewed and are negative.        Allergies   Allergen Reactions    Caffeine Palpitations    Other Rash     "Preservatives"       PHQ Screening   PHQ-9 Total Score: 0 (07/19/2023 11:48 AM)         History  Past Medical History:   Diagnosis Date    Arrhythmia     Atrial Fibrillation    Arthritis     CAD (coronary artery disease)     Hypercholesterolemia     hypertriglycerides    Hypertension        Past Surgical History:   Procedure Laterality Date    APPENDECTOMY      BREAST REDUCTION SURGERY  1990    CHOLECYSTECTOMY      COLONOSCOPY      GYN      hysterectomy    PACEMAKER      TOTAL COLECTOMY      TOTAL COLECTOMY         Social History     Socioeconomic History    Marital status: Widowed     Spouse name: Not on file    Number of children: Not on file    Years of  education: Not on file    Highest education level: Not on file   Occupational History    Not on file   Tobacco Use    Smoking status: Former    Smokeless tobacco: Never   Substance and Sexual Activity    Alcohol use: No    Drug use: No    Sexual activity: Defer   Other Topics Concern    Not on file   Social History Narrative    Not on file     Social Determinants of Health     Financial Resource Strain: Low Risk  (07/19/2023)    Overall Financial Resource Strain (CARDIA)     Difficulty of Paying Living Expenses: Not hard at all   Food Insecurity: No Food Insecurity (07/19/2023)    Hunger Vital Sign     Worried About Running Out of Food in the Last Year: Never true     Ran Out of Food in the Last Year: Never true   Transportation Needs: Unknown (07/19/2023)    PRAPARE - Therapist, art (Medical): Not on file     Lack of Transportation (Non-Medical): No   Physical Activity: Inactive (04/04/2023)    Exercise Vital Sign     Days of Exercise per Week: 0 days      Minutes of Exercise per Session: 0 min   Stress: Not on file   Social Connections: Feeling Socially Integrated (02/20/2023)    OASIS D0700: Social Isolation     Frequency of experiencing loneliness or isolation: Rarely   Intimate Partner Violence: Not on file   Housing Stability: Unknown (07/19/2023)    Housing Stability Vital Sign     Unable to Pay for Housing in the Last Year: Not on file     Number of Places Lived in the Last Year: Not on file     Unstable Housing in the Last Year: No       Current Outpatient Medications   Medication Sig Dispense Refill    apixaban (ELIQUIS) 5 MG TABS tablet Take 1 tablet by mouth 2 times daily 60 tablet 5    donepezil (ARICEPT) 10 MG tablet Take 1 tablet by mouth nightly 30 tablet 5    metoprolol tartrate (LOPRESSOR) 50 MG tablet take 1 tablet by mouth twice a day 60 tablet 2    metoprolol tartrate (LOPRESSOR) 25 MG tablet take 1 tablet by mouth twice a day 60 tablet 2    verapamil (VERELAN) 240 MG extended release capsule take 1 capsule by mouth once daily 30 capsule 2    fenofibrate (TRICOR) 145 MG tablet Take 1 tablet by mouth daily 30 tablet 2    vitamin E 1000 units capsule Take 1 capsule by mouth daily      meclizine (ANTIVERT) 25 MG tablet Take 1 tablet by mouth every 6 hours as needed for Dizziness 30 tablet 0    polyethyl glycol-propyl glycol 0.4-0.3 % (SYSTANE) 0.4-0.3 % ophthalmic solution Apply 2 drops to eye 2 times daily      calcium carbonate 1500 (600 Ca) MG TABS tablet Take 1 tablet by mouth daily      Cholecalciferol 50 MCG (2000 UT) TABS Take 1 tablet by mouth daily      cyanocobalamin 1000 MCG tablet Take 1 tablet by mouth daily       No current facility-administered medications for this visit.       BP 128/82   Pulse 78   Temp 98.3  F (36.8 C) (Temporal)   Resp 16   Ht 1.651 m (5\' 5" )   Wt 78.9 kg (174 lb)   SpO2 98%   BMI 28.96 kg/m      Physical Exam  Vitals and nursing note reviewed.   Constitutional:       General: She is not in acute distress.      Appearance: Normal appearance. She is obese. She is not ill-appearing, toxic-appearing or diaphoretic.   HENT:      Head: Normocephalic and atraumatic.   Cardiovascular:      Rate and Rhythm: Normal rate. Rhythm irregular.      Pulses: Normal pulses.      Heart sounds: Normal heart sounds. No murmur heard.  Pulmonary:      Effort: Pulmonary effort is normal. No respiratory distress.      Breath sounds: Normal breath sounds.   Musculoskeletal:         General: Normal range of motion.      Cervical back: Normal range of motion and neck supple.      Right lower leg: No edema.      Left lower leg: No edema.   Skin:     General: Skin is warm and dry.   Neurological:      General: No focal deficit present.      Mental Status: She is alert. Mental status is at baseline.      Cranial Nerves: No cranial nerve deficit.      Gait: Gait abnormal.   Psychiatric:         Mood and Affect: Mood normal.         Behavior: Behavior normal.         Thought Content: Thought content normal.         Judgment: Judgment normal.          No visits with results within 3 Month(s) from this visit.   Latest known visit with results is:   Hospital Outpatient Visit on 03/26/2023   Component Date Value Ref Range Status    WBC 03/26/2023 8.3  4.6 - 13.2 K/uL Final    RBC 03/26/2023 4.92  4.20 - 5.30 M/uL Final    Hemoglobin 03/26/2023 15.0  12.0 - 16.0 g/dL Final    Hematocrit 04/54/0981 46.9 (H)  35.0 - 45.0 % Final    MCV 03/26/2023 95.3  78.0 - 100.0 FL Final    MCH 03/26/2023 30.5  24.0 - 34.0 PG Final    MCHC 03/26/2023 32.0  31.0 - 37.0 g/dL Final    RDW 19/14/7829 13.2  11.6 - 14.5 % Final    Platelets 03/26/2023 271  135 - 420 K/uL Final    MPV 03/26/2023 11.2  9.2 - 11.8 FL Final    Nucleated RBCs 03/26/2023 0.0  0 PER 100 WBC Final    nRBC 03/26/2023 0.00  0.00 - 0.01 K/uL Final    Neutrophils % 03/26/2023 65  40 - 73 % Final    Lymphocytes % 03/26/2023 26  21 - 52 % Final    Monocytes % 03/26/2023 8  3 - 10 % Final    Eosinophils %  03/26/2023 1  0 - 5 % Final    Basophils % 03/26/2023 0  0 - 2 % Final    Immature Granulocytes % 03/26/2023 0  0.0 - 0.5 % Final    Neutrophils Absolute 03/26/2023 5.4  1.8 - 8.0 K/UL Final    Lymphocytes Absolute 03/26/2023 2.2  0.9 - 3.6 K/UL Final    Monocytes Absolute 03/26/2023 0.6  0.05 - 1.2 K/UL Final    Eosinophils Absolute 03/26/2023 0.1  0.0 - 0.4 K/UL Final    Basophils Absolute 03/26/2023 0.0  0.0 - 0.1 K/UL Final    Immature Granulocytes Absolute 03/26/2023 0.0  0.00 - 0.04 K/UL Final    Differential Type 03/26/2023 AUTOMATED    Final    Sodium 03/26/2023 140  136 - 145 mmol/L Final    Potassium 03/26/2023 3.7  3.5 - 5.5 mmol/L Final    Chloride 03/26/2023 109  100 - 111 mmol/L Final    CO2 03/26/2023 26  21 - 32 mmol/L Final    Anion Gap 03/26/2023 5  3.0 - 18 mmol/L Final    Glucose 03/26/2023 97  74 - 99 mg/dL Final    BUN 16/09/9603 11  7.0 - 18 MG/DL Final    Creatinine 54/08/8118 0.83  0.6 - 1.3 MG/DL Final    BUN/Creatinine Ratio 03/26/2023 13  12 - 20   Final    Est, Glom Filt Rate 03/26/2023 68  >60 ml/min/1.18m2 Final    Comment:    Pediatric calculator link: https://www.kidney.org/professionals/kdoqi/gfr_calculatorped     These results are not intended for use in patients <80 years of age.     eGFR results are calculated without a race factor using  the 2021 CKD-EPI equation. Careful clinical correlation is recommended, particularly when comparing to results calculated using previous equations.  The CKD-EPI equation is less accurate in patients with extremes of muscle mass, extra-renal metabolism of creatinine, excessive creatine ingestion, or following therapy that affects renal tubular secretion.      Calcium 03/26/2023 10.3 (H)  8.5 - 10.1 MG/DL Final    Total Bilirubin 03/26/2023 0.5  0.2 - 1.0 MG/DL Final    ALT 14/78/2956 18  13 - 56 U/L Final    AST 03/26/2023 15  10 - 38 U/L Final    Alk Phosphatase 03/26/2023 56  45 - 117 U/L Final    Total Protein 03/26/2023 7.1  6.4 - 8.2 g/dL  Final    Albumin 21/30/8657 3.5  3.4 - 5.0 g/dL Final    Globulin 84/69/6295 3.6  2.0 - 4.0 g/dL Final    Albumin/Globulin Ratio 03/26/2023 1.0  0.8 - 1.7   Final    Ferritin 03/26/2023 139  8 - 388 NG/ML Final    Iron 03/26/2023 60  50 - 175 ug/dL Final    Patients receiving metal-binding drugs (e.g. deferoxamine) may show spuriously depressed iron values, as chelated iron may not properly react in the iron assay.    TIBC 03/26/2023 413  250 - 450 ug/dL Final    Iron % Saturation 03/26/2023 15 (L)  20 - 50 % Final    TSH, 3rd Generation 03/26/2023 1.71  0.36 - 3.74 uIU/mL Final    LIPID PANEL 03/26/2023      Final    Cholesterol, Total 03/26/2023 164  <200 MG/DL Final    Triglycerides 03/26/2023 117  <150 MG/DL Final    Comment: The drugs N-acetylcysteine (NAC) and  Metamiszole have been found to cause falsely  low results in this chemical assay. Please  be sure to submit blood samples obtained  BEFORE administration of either of these  drugs to assure correct results.      HDL 03/26/2023 54  40 - 60 MG/DL Final    LDL Calculated 03/26/2023 28.4  0 - 100 MG/DL Final    VLDL  Cholesterol Calculated 03/26/2023 23.4  MG/DL Final    Chol/HDL Ratio 03/26/2023 3.0  0 - 5.0   Final       No results found for any visits on 07/19/23.    Patient Care Team:  Patient Care Team:  Cleon Gustin, MD as PCP - General  Cleon Gustin, MD as PCP - Empaneled Provider  Riddle, Gerome Apley, APRN - NP as Nurse Practitioner (Neurology)      Assessment / Plan:    ICD-10-CM    1. Essential (primary) hypertension  I10 CBC with Auto Differential     Comprehensive Metabolic Panel      2. Atrial fibrillation, unspecified type (HCC)  I48.91 apixaban (ELIQUIS) 5 MG TABS tablet      3. Pure hyperglyceridemia  E78.1 Lipid Panel      4. Mild dementia without behavioral disturbance, psychotic disturbance, mood disturbance, or anxiety, unspecified dementia type (HCC)  F03.A0       5. ACP (advance care planning)  Z71.89            HTN: BP acceptable.   Continue verapamil, metoprolol and HCTZ.    Hypertriglyceridemia: Continue fenofibrate.     Dementia: Following with neurology.  Continue Aricept.     Atrial fibrillation: Advised to schedule follow-up appointment with cardiology.  Continue Eliquis, Lopressor 75 mg every day,verapamil.      Osteoarthritis of multiple joints: Patient advised to take Tylenol arthritis.    Labs reviewed and discussed    Advised to get COVID-19 booster, flu shot and RSV vaccine    ACP documents received -patient is DNR    Return in about 6 months (around 01/19/2024).     I asked the patient if she  had any questions and answered her  questions.  The patient stated that she understands the treatment plan and agrees with the treatment plan    This document was created with a voice activated dictation system and may contain transcription errors.

## 2023-07-20 ENCOUNTER — Inpatient Hospital Stay: Admit: 2023-07-20 | Payer: MEDICARE | Primary: Internal Medicine

## 2023-07-20 NOTE — Other (Cosign Needed)
[facility] [place] [facility] PHYSICAL THERAPY  [address] [place], [place] [address] - Ph: [phone]   Fx: [phone]  PHYSICAL THERAPY PROGRESS NOTE  [x]  Progress Note  []  Discharge Summary    Patient Name: Emily Henson DOB: August 06, 1933   Treatment/Medical Diagnosis: Other abnormalities of gait and mobility [R26.89]   Referral Source: Waldon Reining, MD     Date of Initial Visit: 06/13/23 Attended Visits: 3 Missed Visits: 0       Comorbidities: blind, dementia, arthritis, pacemaker  Prior Level of Function: has white cane for determining terrain; lives in 1 story home with 4 steps to enter with daughter; enjoys television and listening to audiobooks     SUMMARY OF TREATMENT  Patient has been seen for 3 visits since initial evaluation on 06/13/2023. Limited appts due to clinic scheduling availability. Therapy has consisted of therapeutic exercise, therapeutic activity, neuromuscular re-education, gait training, patient education and HEP.      % Improvement: 0  Pain range: 0  Subjective Improvement: none  Deficits: decreased compliance with HEP, balance and LE strength, gait pattern    CURRENT STATUS/Progress towards Goals:  Short Term Goals: To be accomplished in 3 weeks  Patient/caregiver will report compliance with initial HEP to optimize therapy outcomes.  Status at evaluation: established  Current: Progressing: decreased compliance     Long Term Goals: To be accomplished in 4 weeks  Patient will be able to maintain MSR stance for at least 20 sec B in order to perform self care tasks with increased ease.  Status at evaluation: 3 sec  Current: 3 sec     2. Patient/family will report/demonstrate standing tolerance of at least 30 min in order to complete household management tasks with increased ease.              Status at evaluation: 15 min              Current: 10-15 minutes      3. Patient will report at least 50% improvement with endurance in order to perform household  management and functional tasks with increased ease.              Status at evaluation: new goal              Current: 0%      Remaining unmet treatment goals from POC:   Short Term Goals: To be accomplished in 3 weeks  Patient/caregiver will report compliance with initial HEP to optimize therapy outcomes.  Status at last progress note: Progressing: decreased compliance     Long Term Goals: To be accomplished in 4 weeks  Patient will be able to maintain MSR stance for at least 20 sec B in order to perform self care tasks with increased ease.  Status at last progress note: 3 sec, no change     2. Patient/family will report/demonstrate standing tolerance of at least 30 min in order to complete household management tasks with increased ease.              Status at last progress note: 10-15 minutes, no change     3. Patient will report at least 50% improvement with endurance in order to perform household management and functional tasks with increased ease.              Status at last progress note: 0%      Medicare, cannot change goals, cannot adjust frequency/duration, no signature required   Reporting Period: (  date from last Prog Note/Eval to current Prog Note/Recert)  06/13/23 - 07/20/23    RECOMMENDATIONS  Patient would benefit from the continuation of skilled rehab interventions, per initial Plan of Care or most recent Medicare Recert, for functional progress to achieving above stated clinically significant goals.    If you have any questions/comments please contact us directly.  Thank you for allowing [place] to assist in the care of your patient.    Johnnette Barrios, PTA       07/20/2023       7:49 AM

## 2023-07-20 NOTE — Progress Notes (Signed)
PHYSICAL / OCCUPATIONAL THERAPY - DAILY TREATMENT NOTE    Patient Name: Emily Henson    Date: 07/20/2023    DOB: 03/04/1933  Insurance: Payor: [employer] MEDICARE / Plan: Pharmacologist [facility] FFS / Product Type: *No Product type* /      Patient DOB verified yes   Visit #   Current / Total 3 12   Time   In / Out 10:42 11:24   Pain   In / Out 0/10 0/10   Subjective Functional Status/Changes: Pt reports no doing her HEP and denies any recent falls.      TREATMENT AREA =  Other abnormalities of gait and mobility [R26.89]     OBJECTIVE      Therapeutic Procedures:    Tx Min Billable or 1:1 Min (if diff from Tx Min) Procedure, Rationale, Specifics   20  97110 Therapeutic Exercise (timed):  increase ROM, strength, coordination, balance, and proprioception to improve patient's ability to progress to PLOF and address remaining functional goals. (see flow sheet as applicable)     Details if applicable:       14  97530 Therapeutic Activity (timed):  use of dynamic activities replicating functional movements to increase ROM, strength, coordination, balance, and proprioception in order to improve patient's ability to progress to PLOF and address remaining functional goals.  (see flow sheet as applicable)    Details if applicable:  bed mobility, bridges, sit to stands     8  97112 Neuromuscular Re-Education (timed):  improve balance, coordination, kinesthetic sense, posture, core stability and proprioception to improve patient's ability to develop conscious control of individual muscles and awareness of position of extremities in order to progress to PLOF and address remaining functional goals. (see flow sheet as applicable)    Details if applicable:  cuing for TA with therex, scapular retraction for posutre re-ed     42  MC BC Totals Reminder: bill using total billable min of TIMED therapeutic procedures (example: do not include dry needle or estim unattended, both untimed codes, in totals to left)  8-22 min = 1 unit;  23-37 min = 2 units; 38-52 min = 3 units; 53-67 min = 4 units; 68-82 min = 5 units   Total Total     [x]   Patient Education billed concurrently with other procedures   [x]  Review HEP    []  Progressed/Changed HEP, detail:    []  Other detail:       Objective Information/Functional Measures/Assessment  SEE PROGRESS NOTE    % Improvement: 0  Pain range: 0  Subjective Improvement: none  Deficits: decreased compliance with HEP, balance and LE strength, gait pattern    - increased difficulty with right LE marching, cuing for TA and to prevent posterior trunk lean  - cuing not to lift hips off table with HL ball squeezes  - TC for scapular retraction, improved with cuing to pinch 100$ bill    ** patient holds her walking stick on the right, please walk on her left side and hold her left arm to guide her with both tactile and verbal cuing **    Patient will continue to benefit from skilled PT / OT services to modify and progress therapeutic interventions, analyze and address functional mobility deficits, analyze and address ROM deficits, analyze and address strength deficits, analyze and address soft tissue restrictions, analyze and cue for proper movement patterns, analyze and modify for postural abnormalities, analyze and address imbalance/dizziness, and instruct in home and community  integration to address functional deficits and attain remaining goals.    Progress toward goals / Updated goals:  []   See Progress Note/Recertification    Short Term Goals: To be accomplished in 3 weeks  Patient/caregiver will report compliance with initial HEP to optimize therapy outcomes.  Status at evaluation: established  Current: Progressing: decreased compliance     Long Term Goals: To be accomplished in 4 weeks  Patient will be able to maintain MSR stance for at least 20 sec B in order to perform self care tasks with increased ease.  Status at evaluation: 3 sec  Current: 3 sec    2. Patient/family will report/demonstrate standing  tolerance of at least 30 min in order to complete household management tasks with increased ease.              Status at evaluation: 15 min   Current: 10-15 minutes     3. Patient will report at least 50% improvement with endurance in order to perform household management and functional tasks with increased ease.              Status at evaluation: new goal   Current: 0%       Next PN/ RC due 08/19/23  Auth due (visit number/ date) 6v exp 08/11/23    PLAN  - Continue Plan of Care  - Upgrade activities as tolerated    Johnnette Barrios, PTA    07/20/2023    7:46 AM    If an interpreting service was utilized for treatment of this patient, the contents of this document represent the material reviewed with the patient via the interpreter.      Future Appointments   Date Time Provider Department Center   07/20/2023 10:40 AM Herb Grays [facility]    07/23/2023 12:40 PM [facility] [address] [facility]   07/25/2023 11:20 AM Johnnette Barrios, PTA [facility]   07/30/2023  2:00 PM Herb Grays [facility]   08/01/2023 11:20 AM Johnnette Barrios, PTA [facility]   08/07/2023 10:30 AM [facility] [facility]   01/21/2024 10:00 AM Maurine Simmering, APRN - NP [facility] NE[facility] BS [facility]   02/01/2024 10:20 AM Saand, James Ivanoff, MD [facility] DEP

## 2023-07-23 ENCOUNTER — Encounter: Payer: MEDICARE | Primary: Internal Medicine

## 2023-07-23 NOTE — Progress Notes (Signed)
PHYSICAL / OCCUPATIONAL THERAPY - DAILY TREATMENT NOTE    Patient Name: Emily Henson    Date: 07/23/2023    DOB: 15-Aug-1933  Insurance: Payor: BCBS MEDICARE / Plan: [employer]ologist [facility] [employer] FFS / Product Type: *No Product type* /      Patient DOB verified yes   Visit #   Current / Total 4 12   Time   In / Out 1242 126   Pain   In / Out 0 0   Subjective Functional Status/Changes: I guess my doctor wanted me to come in, I dont know why     TREATMENT AREA =  Other abnormalities of gait and mobility [R26.89]     OBJECTIVE      Therapeutic Procedures:    Tx Min Billable or 1:1 Min (if diff from Tx Min) Procedure, Rationale, Specifics   17  97110 Therapeutic Exercise (timed):  increase ROM, strength, coordination, balance, and proprioception to improve patient's ability to progress to PLOF and address remaining functional goals. (see flow sheet as applicable)     Details if applicable:       -  97530 Therapeutic Activity (timed):  use of dynamic activities replicating functional movements to increase ROM, strength, coordination, balance, and proprioception in order to improve patient's ability to progress to PLOF and address remaining functional goals.  (see flow sheet as applicable)    Details if applicable:  bed mobility, bridges, sit to stands     17  97112 Neuromuscular Re-Education (timed):  improve balance, coordination, kinesthetic sense, posture, core stability and proprioception to improve patient's ability to develop conscious control of individual muscles and awareness of position of extremities in order to progress to PLOF and address remaining functional goals. (see flow sheet as applicable)    Details if applicable:  cuing for TA with therex, scapular retraction for posutre re-ed     10  97116 Gait Training (timed):    117 feet with visual walking stick (assistive device) over even surfaces with CGA level of assist. Cuing for use of stick and changes of direction d/t Pt's visual deficits.  To  improve safety and dynamic movement with household/community ambulation.  (see flow sheet as applicable)      44  MC BC Totals Reminder: bill using total billable min of TIMED therapeutic procedures (example: do not include dry needle or estim unattended, both untimed codes, in totals to left)  8-22 min = 1 unit; 23-37 min = 2 units; 38-52 min = 3 units; 53-67 min = 4 units; 68-82 min = 5 units   Total Total     [x]   Patient Education billed concurrently with other procedures   []  Review HEP    []  Progressed/Changed HEP, detail:    []  Other detail:       Objective Information/Functional Measures/Assessment  Pt goals: no specifics   Pt requires further verbal cueing for not lifting hips off of table during HL ball squeezes  Pt tolerates ambulation training with visually impaired walking stick well, she requires verbal cueing to increase lateral wing of walking stick during ambulation forward over even surface with CGA inside clinic for increased awareness of enviornment and decreased risk of fall.  Verbal   HEP, "going slowly"    ** patient holds her walking stick on the right, please walk on her left side and hold her left arm to guide her with both tactile and verbal cuing **      Patient will continue  to benefit from skilled PT / OT services to modify and progress therapeutic interventions, analyze and address functional mobility deficits, analyze and address ROM deficits, analyze and address strength deficits, analyze and address soft tissue restrictions, analyze and cue for proper movement patterns, analyze and modify for postural abnormalities, analyze and address imbalance/dizziness, and instruct in home and community integration to address functional deficits and attain remaining goals.    Progress toward goals / Updated goals:  []   See Progress Note/Recertification  Remaining unmet treatment goals from POC:   Short Term Goals: To be accomplished in 3 weeks  Patient/caregiver will report compliance with  initial HEP to optimize therapy outcomes.  Status at last progress note: Progressing: decreased compliance  Current: progressing,       Long Term Goals: To be accomplished in 4 weeks  Patient will be able to maintain MSR stance for at least 20 sec B in order to perform self care tasks with increased ease.  Status at last progress note: 3 sec, no change     2. Patient/family will report/demonstrate standing tolerance of at least 30 min in order to complete household management tasks with increased ease.              Status at last progress note: 10-15 minutes, no change     3. Patient will report at least 50% improvement with endurance in order to perform household management and functional tasks with increased ease.              Status at last progress note: 0%       Next PN/ RC due 08/19/23  Auth due (visit number/ date) 6v exp 08/11/23, 2 visits left before 08/11/23    PLAN  - Continue Plan of Care  - Upgrade activities as tolerated    Geovana Gebel, PTA    07/23/2023    11:15 AM    If an interpreting service was utilized for treatment of this patient, the contents of this document represent the material reviewed with the patient via the interpreter.      Future Appointments   Date Time Provider Department Center   07/23/2023 12:40 PM [facility] PT [address] [facility]   07/25/2023 11:20 AM Johnnette Barrios, PTA [facility]   07/30/2023  2:00 PM Herb Grays [facility]   08/01/2023 11:20 AM Johnnette Barrios, PTA [facility]   08/07/2023 10:30 AM [facility] HOME SLEEP STUDY [facility]   01/21/2024 10:00 AM Maurine Simmering, APRN - NP [facility] NEUR BS AMB   02/01/2024 10:20 AM Saand, James Ivanoff, MD [facility] DE[facility]

## 2023-07-25 ENCOUNTER — Inpatient Hospital Stay: Admit: 2023-07-25 | Payer: MEDICARE | Primary: Internal Medicine

## 2023-07-25 NOTE — Progress Notes (Signed)
PHYSICAL / OCCUPATIONAL THERAPY - DAILY TREATMENT NOTE    Patient Name: Emily Henson    Date: 07/25/2023    DOB: 03-Dec-1933  Insurance: Payor: BCBS MEDICARE / Plan: Pharmacologist [employer] FFS / Product Type: *No Product type* /      Patient DOB verified yes   Visit #   Current / Total 5 12   Time   In / Out 11:22 12:02   Pain   In / Out 0 0   Subjective Functional Status/Changes: Pt reports non-compliance with HEP, "I have been on the go."     TREATMENT AREA =  Other abnormalities of gait and mobility [R26.89]     OBJECTIVE      Therapeutic Procedures:    Tx Min Billable or 1:1 Min (if diff from Tx Min) Procedure, Rationale, Specifics   15  97110 Therapeutic Exercise (timed):  increase ROM, strength, coordination, balance, and proprioception to improve patient's ability to progress to PLOF and address remaining functional goals. (see flow sheet as applicable)     Details if applicable:       15  97530 Therapeutic Activity (timed):  use of dynamic activities replicating functional movements to increase ROM, strength, coordination, balance, and proprioception in order to improve patient's ability to progress to PLOF and address remaining functional goals.  (see flow sheet as applicable)    Details if applicable:  bed mobility, bridges, sit to stands, step to target     10  97112 Neuromuscular Re-Education (timed):  improve balance, coordination, kinesthetic sense, posture, core stability and proprioception to improve patient's ability to develop conscious control of individual muscles and awareness of position of extremities in order to progress to PLOF and address remaining functional goals. (see flow sheet as applicable)    Details if applicable:  cuing for TA with therex, scapular retraction, row/ext for posture re-ed     40  MC BC Totals Reminder: bill using total billable min of TIMED therapeutic procedures (example: do not include dry needle or estim unattended, both untimed codes, in totals to  left)  8-22 min = 1 unit; 23-37 min = 2 units; 38-52 min = 3 units; 53-67 min = 4 units; 68-82 min = 5 units   Total Total     [x]   Patient Education billed concurrently with other procedures   []  Review HEP    []  Progressed/Changed HEP, detail:    []  Other detail:       Objective Information/Functional Measures/Assessment  - increased reps as noted on FS  - added 2# to LAQ and seated marches  - added seated TB row/ext  - initiated step to target with gait belt and CGA    Patient is progressing slowly in therapy. She continues to ambulate with decreased step length however more d/t hesitancy from blindness as she is able to correct with cuing and guidance. Tolerated increased reps well with mild onset of soreness in lumbar spine. Challenged with step to target with decreased balance.     ** patient holds her walking stick on the right, please walk on her left side and hold her left arm to guide her with both tactile and verbal cuing **    Patient will continue to benefit from skilled PT / OT services to modify and progress therapeutic interventions, analyze and address functional mobility deficits, analyze and address ROM deficits, analyze and address strength deficits, analyze and address soft tissue restrictions, analyze and cue for proper movement patterns,  analyze and modify for postural abnormalities, analyze and address imbalance/dizziness, and instruct in home and community integration to address functional deficits and attain remaining goals.    Progress toward goals / Updated goals:  []   See Progress Note/Recertification  Remaining unmet treatment goals from POC:   Short Term Goals: To be accomplished in 3 weeks  Patient/caregiver will report compliance with initial HEP to optimize therapy outcomes.  Status at last progress note: Progressing: decreased compliance  Current: progressing,       Long Term Goals: To be accomplished in 4 weeks  Patient will be able to maintain MSR stance for at least 20 sec B in  order to perform self care tasks with increased ease.  Status at last progress note: 3 sec, no change     2. Patient/family will report/demonstrate standing tolerance of at least 30 min in order to complete household management tasks with increased ease.              Status at last progress note: 10-15 minutes, no change     3. Patient will report at least 50% improvement with endurance in order to perform household management and functional tasks with increased ease.              Status at last progress note: 0%       Next PN/ RC due 08/19/23  Auth due (visit number/ date) 6v exp 08/11/23, 2 visits left before 08/11/23    PLAN  - Continue Plan of Care  - Upgrade activities as tolerated    Johnnette Barrios, PTA    07/25/2023    8:14 AM    If an interpreting service was utilized for treatment of this patient, the contents of this document represent the material reviewed with the patient via the interpreter.      Future Appointments   Date Time Provider Department Center   07/25/2023 11:20 AM Herb Grays [facility]   07/30/2023  2:00 PM Herb Grays [facility]   08/01/2023 11:20 AM Johnnette Barrios, PTA [facility]   08/07/2023 10:30 AM [facility] HOME SLEEP STUDY [facility]   01/21/2024 10:00 AM Maurine Simmering, APRN - NP HR [facility] NEUR BS AMB   02/01/2024 10:20 AM Saand, James Ivanoff, MD [facility] DE[facility]

## 2023-07-30 ENCOUNTER — Inpatient Hospital Stay: Admit: 2023-07-30 | Payer: MEDICARE | Primary: Internal Medicine

## 2023-07-30 NOTE — Progress Notes (Incomplete)
PHYSICAL / OCCUPATIONAL THERAPY - DAILY TREATMENT NOTE    Patient Name: Emily Henson    Date: 07/30/2023    DOB: Feb 16, 1933  Insurance: Payor: BCBS MEDICARE / Plan: Pharmacologist [employer] FFS / Product Type: *No Product type* /      Patient DOB verified yes   Visit #   Current / Total 6 12   Time   In / Out 2:01 2:40   Pain   In / Out 0 0   Subjective Functional Status/Changes: Pt reports non-compliance with HEP. They are still on the go.      TREATMENT AREA =  Other abnormalities of gait and mobility [R26.89]     OBJECTIVE      Therapeutic Procedures:    Tx Min Billable or 1:1 Min (if diff from Tx Min) Procedure, Rationale, Specifics   18  97110 Therapeutic Exercise (timed):  increase ROM, strength, coordination, balance, and proprioception to improve patient's ability to progress to PLOF and address remaining functional goals. (see flow sheet as applicable)     Details if applicable:       9  97530 Therapeutic Activity (timed):  use of dynamic activities replicating functional movements to increase ROM, strength, coordination, balance, and proprioception in order to improve patient's ability to progress to PLOF and address remaining functional goals.  (see flow sheet as applicable)    Details if applicable:  bridges, sit to stands, gait training <7 min     12  97112 Neuromuscular Re-Education (timed):  improve balance, coordination, kinesthetic sense, posture, core stability and proprioception to improve patient's ability to develop conscious control of individual muscles and awareness of position of extremities in order to progress to PLOF and address remaining functional goals. (see flow sheet as applicable)    Details if applicable:  MSR, cuing for TA with therex, scapular retraction, row/ext for posture re-ed     39  MC BC Totals Reminder: bill using total billable min of TIMED therapeutic procedures (example: do not include dry needle or estim unattended, both untimed codes, in totals to  left)  8-22 min = 1 unit; 23-37 min = 2 units; 38-52 min = 3 units; 53-67 min = 4 units; 68-82 min = 5 units   Total Total     [x]   Patient Education billed concurrently with other procedures   []  Review HEP    []  Progressed/Changed HEP, detail:    []  Other detail:       Objective Information/Functional Measures/Assessment  - gait training with cuing for increased step length and heel to toe  - ***    ***  Patient is progressing slowly in therapy. She continues to ambulate with decreased step length however more d/t hesitancy from blindness as she is able to correct with cuing and guidance. Tolerated increased reps well with mild onset of soreness in lumbar spine. Challenged with step to target with decreased balance.     ** patient holds her walking stick on the right, please walk on her left side and hold her left arm to guide her with both tactile and verbal cuing **    Patient will continue to benefit from skilled PT / OT services to modify and progress therapeutic interventions, analyze and address functional mobility deficits, analyze and address ROM deficits, analyze and address strength deficits, analyze and address soft tissue restrictions, analyze and cue for proper movement patterns, analyze and modify for postural abnormalities, analyze and address imbalance/dizziness, and instruct in home  and community integration to address functional deficits and attain remaining goals.    Progress toward goals / Updated goals:  []   See Progress Note/Recertification  Remaining unmet treatment goals from POC:   Short Term Goals: To be accomplished in 3 weeks  Patient/caregiver will report compliance with initial HEP to optimize therapy outcomes.  Status at last progress note: Progressing: decreased compliance  Current: non-complaint(07/30/23)      Long Term Goals: To be accomplished in 4 weeks  Patient will be able to maintain MSR stance for at least 20 sec B in order to perform self care tasks with increased  ease.  Status at last progress note: 3 sec, no change  Current: Progressing: 30 seconds, increased sway bilaterally     2. Patient/family will report/demonstrate standing tolerance of at least 30 min in order to complete household management tasks with increased ease.              Status at last progress note: 10-15 minutes, no change     3. Patient will report at least 50% improvement with endurance in order to perform household management and functional tasks with increased ease.              Status at last progress note: 0%       Next PN/ RC due 08/19/23  Auth due (visit number/ date) 6v exp 08/11/23, 2 visits left before 08/11/23    PLAN  - Continue Plan of Care  - Upgrade activities as tolerated    Johnnette Barrios, PTA    07/30/2023    7:50 AM    If an interpreting service was utilized for treatment of this patient, the contents of this document represent the material reviewed with the patient via the interpreter.      Future Appointments   Date Time Provider Department Center   07/30/2023  2:00 PM Herb Grays [facility]   08/01/2023 11:20 AM Johnnette Barrios, PTA [facility]   08/07/2023 10:30 AM [facility] HOME SLEEP STUDY [facility]   01/21/2024 10:00 AM Maurine Simmering, APRN - NP HR [facility] NEUR BS AMB   02/01/2024 10:20 AM Saand, James Ivanoff, MD [facility]

## 2023-08-01 ENCOUNTER — Inpatient Hospital Stay: Admit: 2023-08-01 | Payer: MEDICARE | Primary: Internal Medicine

## 2023-08-01 NOTE — Discharge Instructions (Signed)
[facility] - [facility] [address]  [address]  [place], [place] [address]  [phone]  [phone] fax    Physical Therapy Discharge Summary    Patient Name: Schelly Callery DOB: 1933-08-26   Medical   Diagnosis: Unspecified abnormalities of gait and mobility [R26.9]  History of falling [Z91.81] Treatment Diagnosis: R26.89  Abnormalities of gait and mobility      Onset Date: Aggravated several mon Payor :  Payor: BCBS / Plan: ANTHEM [employer] [place] / Product Type: *No Product type* /    Referral Source: Waldon Reining, MD Start of Care [facility]): 06/13/2023   Prior Hospitalization: See medical history Provider #: [phone]   Prior Level of Function: has white cane for determining terrain; lives in 1 story home with 4 steps to enter with daughter; enjoys television and listening to audiobooks   Comorbidities: blind, dementia, arthritis, pacemaker     Visits from Start of Care: 7    Missed Visits: 0    Reporting Period : 06/13/23 to 08/01/23    Summary of Care:  Short Term Goals: To be accomplished in 3 weeks  Patient/caregiver will report compliance with initial HEP to optimize therapy outcomes.  Status at last progress note: Progressing: decreased compliance  Status at discharge: non-complaint     Long Term Goals: To be accomplished in 4 weeks  Patient will be able to maintain MSR stance for at least 20 sec B in order to perform self care tasks with increased ease.  Status at last progress note: 3 sec, no change  Status at discharge: Progressing: 30 seconds, increased sway bilaterally     2. Patient/family will report/demonstrate standing tolerance of at least 30 min in order to complete household management tasks with increased ease.              Status at last progress note: 10-15 minutes, no change   Status at discharge: not assessed     3. Patient will report at least 50% improvement with endurance in order to perform household management and functional tasks with increased ease.              Status  at last progress note: 0%   Status at discharge: 0%      ASSESSMENT/RECOMMENDATIONS:   Ms. Diebel has made slow progress in therapy with most progress in LE strength. No significant change in overall balance or gait pattern.     Discussed at length with pt: Pt reports not doing her HEP much because she is on the go to appts and has to go with her daughter everywhere because they do not want to leave patient alone. Patient reports she never took any classes for blindness and was only provided a stick to use for balance which she has been using 4-5 years. She does not use the stick in the house all of the time and reports she has a map in her head of the layout of the house. Patient reports she does not worry too much about her balance in public either because someone is always with her to guide her. She is able to negotiate the 4 steps into the house and does not have to climb the steps to the second floor once inside.     Goals not fully assessed d/t her daughter informing therapist at end of session that patient would discharge; daughter states pt does not do her HEP, has had no change in gait or balance, and this is her 3rd  trial of therapy for this. At this time, patient is appropriate for discharge from skilled outpatient PT.    [x] Discontinue therapy: [] Patient has reached or is progressing toward set goals      [x] Patient is non-compliant or has abdicated      [x] Due to lack of appreciable progress towards set goals    Johnnette Barrios, PTA   08/01/2023   12:00 PM    Not Medicaid Ins, no signature required for DC

## 2023-08-01 NOTE — Progress Notes (Signed)
PHYSICAL / OCCUPATIONAL THERAPY - DAILY TREATMENT NOTE    Patient Name: Emily Henson    Date: 08/01/2023    DOB: Jul 28, 1933  Insurance: Payor: [employer] MEDICARE / Plan: [employer] [facility] FFS / Product Type: *No Product type* /      Patient DOB verified yes   Visit #   Current / Total 7 12   Time   In / Out 11:22 12:00   Pain   In / Out 0 0   Subjective Functional Status/Changes: Pt report not doing her HEP much. She is on the go to appts and has to go with her daughter everywhere because they don't want to leave her alone.     Patient reports she never took any classes or was taught the clock method with ambulation. She was just given a stick to use for balance. She doesn't use the stick in the house all of the time, she has a map in her head of the layout of the house. She doesn't worry too much in public either because someone is always with her to guide her.      TREATMENT AREA =  Other abnormalities of gait and mobility [R26.89]     OBJECTIVE      Therapeutic Procedures:    Tx Min Billable or 1:1 Min (if diff from Tx Min) Procedure, Rationale, Specifics   20  97110 Therapeutic Exercise (timed):  increase ROM, strength, coordination, balance, and proprioception to improve patient's ability to progress to PLOF and address remaining functional goals. (see flow sheet as applicable)     Details if applicable:       8  97530 Therapeutic Activity (timed):  use of dynamic activities replicating functional movements to increase ROM, strength, coordination, balance, and proprioception in order to improve patient's ability to progress to PLOF and address remaining functional goals.  (see flow sheet as applicable)    Details if applicable:  bridges, bed mobility     10  97112 Neuromuscular Re-Education (timed):  improve balance, coordination, kinesthetic sense, posture, core stability and proprioception to improve patient's ability to develop conscious control of individual muscles and awareness of position  of extremities in order to progress to PLOF and address remaining functional goals. (see flow sheet as applicable)    Details if applicable:  MSR, tandem, foam EO     38  MC BC Totals Reminder: bill using total billable min of TIMED therapeutic procedures (example: do not include dry needle or estim unattended, both untimed codes, in totals to left)  8-22 min = 1 unit; 23-37 min = 2 units; 38-52 min = 3 units; 53-67 min = 4 units; 68-82 min = 5 units   Total Total     [x]   Patient Education billed concurrently with other procedures   []  Review HEP    []  Progressed/Changed HEP, detail:    []  Other detail:       Objective Information/Functional Measures/Assessment  SEE DISCHARGE SUMMARY    % Improvement: 0  Pain range: N/A  Subjective Improvement: none  Deficits: gait and balance    - initiated tandem balance  - initiated foam EO balance  - initiate foam step ups  - progressed to 3# LAQ  - pt daughter informed therapist at end of session that today would be her last day - she stated pt does not do her HEP, has had no change in gait or balance and this is her 3rd round of therapy for this  Patient will continue to benefit from skilled PT / OT services to modify and progress therapeutic interventions, analyze and address functional mobility deficits, analyze and address ROM deficits, analyze and address strength deficits, analyze and address soft tissue restrictions, analyze and cue for proper movement patterns, analyze and modify for postural abnormalities, analyze and address imbalance/dizziness, and instruct in home and community integration to address functional deficits and attain remaining goals.    Progress toward goals / Updated goals:  []   See Progress Note/Recertification  Remaining unmet treatment goals from POC:   Short Term Goals: To be accomplished in 3 weeks  Patient/caregiver will report compliance with initial HEP to optimize therapy outcomes.  Status at last progress note: Progressing: decreased  compliance  Current: non-complaint (07/30/23)      Long Term Goals: To be accomplished in 4 weeks  Patient will be able to maintain MSR stance for at least 20 sec B in order to perform self care tasks with increased ease.  Status at last progress note: 3 sec, no change  Current: Progressing: 30 seconds, increased sway bilaterally     2. Patient/family will report/demonstrate standing tolerance of at least 30 min in order to complete household management tasks with increased ease.              Status at last progress note: 10-15 minutes, no change     3. Patient will report at least 50% improvement with endurance in order to perform household management and functional tasks with increased ease.              Status at last progress note: 0%       PLAN  - Continue Plan of Care  - Upgrade activities as tolerated    Johnnette Barrios, PTA    08/01/2023    8:26 AM    If an interpreting service was utilized for treatment of this patient, the contents of this document represent the material reviewed with the patient via the interpreter.      Future Appointments   Date Time Provider Department Center   08/01/2023 11:20 AM Johnnette Barrios, PTA [facility]   08/07/2023 10:30 AM [facility] HOME SLEEP STUDY [facility]   01/21/2024 10:00 AM Maurine Simmering, APRN - NP [facility]   02/01/2024 10:20 AM Saand, James Ivanoff, MD ABMA-[facility] DEP

## 2023-08-01 NOTE — Progress Notes (Signed)
Physical Therapy Discharge Instructions      [facility]  [address]  [place], [place] [address]  [phone]  [phone] fax    Patient: Emily Henson  DOB: February 26, 1933      Continue Home Exercise Program 4-5 times per week.           Follow up with MD:     []  Upon completion of therapy     [x]  As needed      Recommendations:     [x]    Return to activity with home program    []    Return to activity with the following modifications:       [] Post Rehab Program    [] Join Independent aquatic program     [] Return to/join local gym

## 2023-08-07 ENCOUNTER — Inpatient Hospital Stay: Admit: 2023-08-07 | Discharge: 2023-08-07 | Payer: MEDICARE | Attending: Otolaryngology | Primary: Internal Medicine

## 2023-08-07 DIAGNOSIS — G4719 Other hypersomnia: Secondary | ICD-10-CM

## 2023-08-07 DIAGNOSIS — R29818 Other symptoms and signs involving the nervous system: Secondary | ICD-10-CM

## 2023-08-16 ENCOUNTER — Encounter

## 2023-08-17 MED ORDER — DONEPEZIL HCL 10 MG PO TABS
10 MG | ORAL_TABLET | Freq: Every evening | ORAL | 5 refills | Status: DC
Start: 2023-08-17 — End: 2024-01-21

## 2023-08-21 NOTE — Telephone Encounter (Signed)
 I called the patient's daughter today and went over her mother's sleep study results.  She has mild obstructive sleep apnea with an AHI of 10.7.  The study also demonstrated atrial fibrillation, which is not a new diagnosis.  What we discussed on the phone today is that treating this is not likely going to make a big improvement in daytime functioning/excessive daytime sleepiness or other medical conditions at this point for her mother.  Additionally, due to her dementia, it would be exceedingly difficult to start her on CPAP at this juncture.      We both feel comfortable that the goal of getting the home sleep study was to document the degree of Obstructive Sleep Apnea (if present).  I also wanted to make sure she did not have sleep-related hypoxemia or central sleep apnea, which she does not.    I'll be happy to see them back in the office if needed if things change with respect to sleep.

## 2023-09-01 ENCOUNTER — Encounter

## 2023-09-05 ENCOUNTER — Encounter

## 2023-09-06 ENCOUNTER — Encounter

## 2023-09-06 MED ORDER — METOPROLOL TARTRATE 25 MG PO TABS
25 | ORAL_TABLET | Freq: Two times a day (BID) | ORAL | 2 refills | Status: DC
Start: 2023-09-06 — End: 2023-09-07

## 2023-09-06 MED ORDER — VERAPAMIL HCL ER 240 MG PO CP24
240 | ORAL_CAPSULE | Freq: Every day | ORAL | 2 refills | Status: DC
Start: 2023-09-06 — End: 2023-09-07

## 2023-09-06 MED ORDER — METOPROLOL TARTRATE 50 MG PO TABS
50 | ORAL_TABLET | Freq: Two times a day (BID) | ORAL | 2 refills | Status: DC
Start: 2023-09-06 — End: 2023-09-07

## 2023-09-06 MED ORDER — FENOFIBRATE 145 MG PO TABS
145 MG | ORAL_TABLET | Freq: Every day | ORAL | 2 refills | Status: DC
Start: 2023-09-06 — End: 2024-04-17

## 2023-09-07 MED ORDER — METOPROLOL TARTRATE 50 MG PO TABS
50 | ORAL_TABLET | Freq: Two times a day (BID) | ORAL | 2 refills | 63.50000 days | Status: DC
Start: 2023-09-07 — End: 2024-04-17

## 2023-09-07 MED ORDER — VERAPAMIL HCL ER 240 MG PO CP24
240 | ORAL_CAPSULE | Freq: Every day | ORAL | 2 refills | Status: DC
Start: 2023-09-07 — End: 2024-07-01

## 2023-09-07 MED ORDER — METOPROLOL TARTRATE 25 MG PO TABS
25 | ORAL_TABLET | Freq: Two times a day (BID) | ORAL | 2 refills | 63.50000 days | Status: DC
Start: 2023-09-07 — End: 2024-04-17

## 2023-09-19 ENCOUNTER — Encounter

## 2023-09-20 MED ORDER — ELIQUIS 5 MG PO TABS
5 MG | ORAL_TABLET | Freq: Two times a day (BID) | ORAL | 5 refills | Status: AC
Start: 2023-09-20 — End: 2023-10-04

## 2023-10-04 ENCOUNTER — Telehealth

## 2023-10-04 MED ORDER — APIXABAN 5 MG PO TABS
5 MG | ORAL_TABLET | Freq: Two times a day (BID) | ORAL | 5 refills | Status: DC
Start: 2023-10-04 — End: 2024-01-08

## 2023-10-04 NOTE — Telephone Encounter (Signed)
Sent!

## 2023-10-04 NOTE — Telephone Encounter (Signed)
Pt request medication refill/     ELIQUIS 5 MG     Location     [facility]      Daughter reported they are charging her mother 500.00 request please send to [facility] just the Eliquis

## 2023-10-25 ENCOUNTER — Telehealth

## 2023-10-25 ENCOUNTER — Ambulatory Visit: Payer: MEDICARE | Attending: Internal Medicine | Primary: Internal Medicine

## 2023-10-25 MED ORDER — GUAIFENESIN ER 600 MG PO TB12
600 | ORAL_TABLET | Freq: Two times a day (BID) | ORAL | 0 refills | Status: AC
Start: 2023-10-25 — End: 2023-11-09

## 2023-10-25 MED ORDER — BENZONATATE 100 MG PO CAPS
100 | ORAL_CAPSULE | Freq: Three times a day (TID) | ORAL | 0 refills | Status: AC | PRN
Start: 2023-10-25 — End: 2023-11-04

## 2023-10-25 NOTE — Telephone Encounter (Signed)
Pt's daughter stated that pt has a bad cold with congestion    Offered appt pt's daughter wanted today for VV but was not available    Pt's daughter requesting medication for pt's congestion please    [employer]  [place]     [phone]

## 2023-10-25 NOTE — Telephone Encounter (Signed)
Spoke to patient and the daughter over the phone.  Patient is complaining of sinus congestion and cough.  Likely has a viral sinusitis.  Will prescribe Tessalon Perles and Mucinex

## 2023-11-05 ENCOUNTER — Telehealth: Admit: 2023-11-05 | Admitting: Internal Medicine

## 2023-11-05 DIAGNOSIS — F03A Unspecified dementia, mild, without behavioral disturbance, psychotic disturbance, mood disturbance, and anxiety (HCC): Secondary | ICD-10-CM

## 2023-11-05 NOTE — Telephone Encounter (Signed)
 Pt request referral for Home health,stated a Authorization  from Insurance is requested.    Request call back once completed

## 2023-11-07 NOTE — Telephone Encounter (Signed)
 Referral for home health placed.  Please inform patient's daughter

## 2023-11-07 NOTE — Telephone Encounter (Signed)
 Patient daughter notified via phone.

## 2023-11-23 NOTE — Telephone Encounter (Signed)
 Pt requesting prior authorization for insurance     Informed that the referral for Home health was placed but insurance still stated that prior authorization is needed    Pt's daughter requesting call back - Tamela Gammon: [phone]

## 2023-12-03 ENCOUNTER — Encounter: Payer: TRICARE (CHAMPUS) | Attending: Otolaryngology | Primary: Internal Medicine

## 2023-12-28 NOTE — Telephone Encounter (Addendum)
Patient's daughter dropped off paperwork for provider to sign regarding some supplies relating to patient's blindness that patient needs. Patient's daughter states they need proof she is blind and needing these benefits. Copies were made in Careers information officer and originals placed in provider's box.    Daughter also is following up on the Home Health Aid for her mother as well, the insurance stated they still haven't received the prior auth for that yet.    Please contact Michele/Pt's daughter when completed.

## 2024-01-08 ENCOUNTER — Encounter

## 2024-01-09 MED ORDER — APIXABAN 5 MG PO TABS
5 MG | ORAL_TABLET | Freq: Two times a day (BID) | ORAL | 5 refills | Status: AC
Start: 2024-01-09 — End: 2024-01-15

## 2024-01-15 ENCOUNTER — Telehealth

## 2024-01-15 MED ORDER — APIXABAN 5 MG PO TABS
5 | ORAL_TABLET | Freq: Two times a day (BID) | ORAL | 5 refills | Status: DC
Start: 2024-01-15 — End: 2024-12-16

## 2024-01-15 NOTE — Telephone Encounter (Signed)
Emily Henson Emily Henson stated insurance will not cover medication at [facility] for this 1 medication     Pt request refill sent to another pharmacy for this 1 medication     Eliquis     Location     [facility]      Pt stated [facility] cancel prescription

## 2024-01-15 NOTE — Telephone Encounter (Signed)
Please inform patient it was sent to [facility] pharmacy

## 2024-01-15 NOTE — Telephone Encounter (Signed)
Informed patient of Med fill.        Darrick Grinder, RMA

## 2024-01-21 ENCOUNTER — Ambulatory Visit: Admit: 2024-01-21 | Discharge: 2024-01-21 | Payer: MEDICARE | Attending: Acute Care | Primary: Internal Medicine

## 2024-01-21 MED ORDER — MEMANTINE HCL 5 MG PO TABS
5 | ORAL_TABLET | ORAL | 1 refills | 45.00000 days | Status: DC
Start: 2024-01-21 — End: 2024-12-16

## 2024-01-21 MED ORDER — DONEPEZIL HCL 10 MG PO TABS
10 | ORAL_TABLET | Freq: Every evening | ORAL | 5 refills | 60.00000 days | Status: DC
Start: 2024-01-21 — End: 2024-05-14

## 2024-01-21 NOTE — Progress Notes (Signed)
[name]  [address] [place], [place], [place] [address]  Office:  [phone]  Fax: [phone]  Chief Complaint   Patient presents with    Follow-up     Mild dementia without behavioral disturbance, psychotic disturbance, mood disturbance, or anxiety, unspecified dementia type        HPI: Emily Henson presents in follow-up for dementia. She was last seen here on 07/17/2023.  Continued Aricept 10 mg daily.  Encouraged to proceed with neuropsychology evaluation if interested.      She presents today in follow-up.  She is with her daughter.  Doing ok.  Sleep is ok at night.  But sleeps a lot during the day.  Has the tv on.  Dozes on and off.  Walking: not too good, slowing down.  Daughter thinks she's in the bed too much which contributes.  Stopped outpatient PT in August.  They need an aide.  Daughter works from home.  Daughter reached out regarding resources for the blind.  She is oriented here, but daughter states that is worse at home.  Wakes up and doesn't know who daughter is upon awakening in the afternoon or waking before dinner.  Waking in the morning is ok.  She is on Aricept 10 mg daily.  Has new dx of mild OSA but not being treated.  Did not have sleep-related hypoxemia or central sleep apnea.     Past Medical History:   Diagnosis Date    Arrhythmia     Atrial Fibrillation    Arthritis     CAD (coronary artery disease)     Hypercholesterolemia     hypertriglycerides    Hypertension        Past Surgical History:   Procedure Laterality Date    APPENDECTOMY      BREAST REDUCTION SURGERY  1990    CHOLECYSTECTOMY      COLONOSCOPY      GYN      hysterectomy    PACEMAKER      TOTAL COLECTOMY      TOTAL COLECTOMY         Current Outpatient Medications   Medication Sig Dispense Refill    donepezil (ARICEPT) 10 MG tablet Take 1 tablet by mouth nightly 30 tablet 5    memantine (NAMENDA) 5 MG tablet Take 1 tab by mouth nightly x 1 week then 1 tab twice daily. 180 tablet 1    apixaban  (ELIQUIS) 5 MG TABS tablet Take 1 tablet by mouth 2 times daily 180 tablet 5    verapamil (VERELAN) 240 MG extended release capsule take 1 capsule by mouth once daily 90 capsule 2    metoprolol tartrate (LOPRESSOR) 50 MG tablet take 1 tablet by mouth twice a day 180 tablet 2    metoprolol tartrate (LOPRESSOR) 25 MG tablet take 1 tablet by mouth twice a day 180 tablet 2    fenofibrate (TRICOR) 145 MG tablet take 1 tablet by mouth once daily 90 tablet 2    vitamin E 1000 units capsule Take 1 capsule by mouth daily      meclizine (ANTIVERT) 25 MG tablet Take 1 tablet by mouth every 6 hours as needed for Dizziness 30 tablet 0    polyethyl glycol-propyl glycol 0.4-0.3 % (SYSTANE) 0.4-0.3 % ophthalmic solution Apply 2 drops to eye 2 times daily      calcium carbonate 1500 (600 Ca) MG TABS tablet Take 1 tablet by mouth daily      cyanocobalamin  1000 MCG tablet Take 1 tablet by mouth daily      Cholecalciferol 50 MCG (2000 UT) TABS Take 1 tablet by mouth daily       No current facility-administered medications for this visit.        Allergies   Allergen Reactions    Caffeine Palpitations    Other Rash     "Preservatives"       Social History     Tobacco Use    Smoking status: Former    Smokeless tobacco: Never   Substance Use Topics    Alcohol use: Yes     Comment: occ    Drug use: No       Family History   Problem Relation Age of Onset    Diabetes Mother     Hypertension Mother        Review of Systems:  GENERAL: Denies fever. +fatigue  CARDIAC: No CP or SOB  PULMONARY: No cough or SOB  MUSCULOSKELETAL: No new joint pain  NEURO: SEE HPI    Physical Examination:  BP 125/80 (Site: Left Upper Arm, Position: Sitting, Cuff Size: Medium Adult)   Pulse 98   Temp 98.3 F (36.8 C) (Oral)   Ht 1.651 m (5\' 5" )   Wt 76.3 kg (168 lb 3.2 oz)   SpO2 100%   BMI 27.99 kg/m     Alert, in NAD. Heart is regular. Oriented x3. Oriented to doctor's office, state, month. Year as 2024. Speech is fluent. Speech clear. She is conversational  and appropriate. Daughter helped with history. She is blind.  No facial asymmetry. Strength and tone are normal. No drift of the bilateral upper extremities. Ambulates with walking stick, impaired by vision impairment.      Last MMSE 09/11/2022: which was limited due to: patient is blind. Oriented to year, season, date. Month as October then September. Oriented to state, country, hospital, floor. Spelling 'world' backwards: 'DLO". Serial 7s: 93.       Impression/Plan:  Emily Henson is a 89 y.o. female who presents in follow-up for memory loss, possible mild dementia.  Short-term memory difficulties are reported.  CT head showed sequela of chronic microvascular ischemia and global atrophy.  She does ADLs.  She has help with her daughter for medications, finances, cooking, etc.  No mood or safety concerns.  Daughter notes cognitive decline.  Daughter reports some confusion after naps.  She is okay after waking in the morning but after naps she may not know who her daughters initially.  She had the sleep study which found mild OSA.  Per sleep specialist note, they are holding off on treating this.  She did not have sleep related hypoxemia.  She is currently on Aricept 10 mg daily.  Will start Namenda 5 mg nightly x 1 week then 5 mg twice daily.  Advised on administration and potential side effects of the medication.  Patient had not wanted neuropsychology evaluation.  Encouraged to keep more active during the daytime.  They are going to look into the resources for the blind and maybe get back to doing activities like listening to audiobooks.  Follow up in 3 months.    Emily Henson was seen today for follow-up.    Diagnoses and all orders for this visit:    Mild dementia without behavioral disturbance, psychotic disturbance, mood disturbance, or anxiety, unspecified dementia type (HCC)  -     donepezil (ARICEPT) 10 MG tablet; Take 1 tablet by mouth nightly  -  memantine (NAMENDA) 5 MG tablet; Take 1 tab by mouth nightly x  1 week then 1 tab twice daily.      Signed By: Maurine Simmering, APRN - NP        PLEASE NOTE:   Portions of this document may have been produced using voice recognition software. Unrecognized errors in transcription may be present.

## 2024-01-21 NOTE — Patient Instructions (Addendum)
Patient instructions:  -vitamin B12- can lower to 1000 mcg daily    -donepezil 10 mg daily    -start memantine (Namenda) 5 mg nightly x 1 week the 5 mg twice daily

## 2024-01-21 NOTE — Telephone Encounter (Signed)
Picked up Reader paperwork

## 2024-02-01 ENCOUNTER — Ambulatory Visit: Payer: MEDICARE | Attending: Internal Medicine | Primary: Internal Medicine

## 2024-02-01 NOTE — Progress Notes (Deleted)
 Emily Henson is a 89 y.o. female presenting today for No chief complaint on file.  Emily Henson     No chief complaint on file.      HPI:  Emily Henson presents to the office today for follow up.    Patient has a PMHx of HTN, atrial fibrillation, SSS s/p PPM, blindness, hypertriglyceridemia, dementia.    Patient present today with her daughter.    HTN: Patient is on metoprolol, HCTZ and verapamil.  BP is controlled.     Atrial fibrillation: She has a diagnosis of SSS s/p pacemaker.  Currently on Eliquis, Lopressor and verapamil.  Follows with cardiology.      Hypertriglyceridemia: Lipid panel in 03/2023 showed LDL 86.6.Emily Henson  HbA1c was 5.6%.     Dementia: Patient is following with neurology.  She is experiencing worsening memory loss.  She has mobility issues due to blindness and poor balance..  Her Aricept was increased to 10 mg nightly.  She was also referred to neuropsychology. She if following with home health for PT.     Daughter reports patient has been more tired and drowsy.  Patient saw sleep medicine and a sleep test was ordered.        Review of Systems   Constitutional:  Negative for activity change, appetite change, chills, diaphoresis, fatigue, fever and unexpected weight change.   HENT:  Negative for congestion, nosebleeds, postnasal drip and rhinorrhea.    Eyes:  Positive for visual disturbance.   Respiratory:  Negative for cough, chest tightness, shortness of breath and wheezing.    Cardiovascular:  Negative for chest pain and leg swelling.   Gastrointestinal:  Negative for abdominal pain, blood in stool, diarrhea, nausea and vomiting.   Endocrine: Negative for polydipsia and polyphagia.   Genitourinary:  Negative for decreased urine volume, dysuria, frequency and pelvic pain.   Musculoskeletal:  Positive for arthralgias and gait problem. Negative for back pain, myalgias and neck pain.   Neurological:  Negative for dizziness, tremors, seizures, syncope, speech difficulty, weakness, numbness and headaches.    Psychiatric/Behavioral:  Positive for sleep disturbance. Negative for agitation and confusion. The patient is not nervous/anxious.         Memory deficits   All other systems reviewed and are negative.        Allergies   Allergen Reactions    Caffeine Palpitations    Other Rash     "Preservatives"       PHQ Screening   No data recorded         History  Past Medical History:   Diagnosis Date    Arrhythmia     Atrial Fibrillation    Arthritis     CAD (coronary artery disease)     Hypercholesterolemia     hypertriglycerides    Hypertension        Past Surgical History:   Procedure Laterality Date    APPENDECTOMY      BREAST REDUCTION SURGERY  1990    CHOLECYSTECTOMY      COLONOSCOPY      GYN      hysterectomy    PACEMAKER      TOTAL COLECTOMY      TOTAL COLECTOMY         Social History     Socioeconomic History    Marital status: Widowed     Spouse name: Not on file    Number of children: Not on file    Years of education: Not on file  Highest education level: Not on file   Occupational History    Not on file   Tobacco Use    Smoking status: Former    Smokeless tobacco: Never   Substance and Sexual Activity    Alcohol use: Yes     Comment: occ    Drug use: No    Sexual activity: Defer   Other Topics Concern    Not on file   Social History Narrative    Not on file     Social Determinants of Health     Financial Resource Strain: Low Risk  (07/19/2023)    Overall Financial Resource Strain (CARDIA)     Difficulty of Paying Living Expenses: Not hard at all   Food Insecurity: No Food Insecurity (07/19/2023)    Hunger Vital Sign     Worried About Running Out of Food in the Last Year: Never true     Ran Out of Food in the Last Year: Never true   Transportation Needs: Unknown (07/19/2023)    PRAPARE - Therapist, art (Medical): Not on file     Lack of Transportation (Non-Medical): No   Physical Activity: Inactive (04/04/2023)    Exercise Vital Sign     Days of Exercise per Week: 0 days     Minutes of  Exercise per Session: 0 min   Stress: Not on file   Social Connections: Feeling Socially Integrated (02/20/2023)    OASIS D0700: Social Isolation     Frequency of experiencing loneliness or isolation: Rarely   Intimate Partner Violence: Not on file   Housing Stability: Unknown (07/19/2023)    Housing Stability Vital Sign     Unable to Pay for Housing in the Last Year: Not on file     Number of Places Lived in the Last Year: Not on file     Unstable Housing in the Last Year: No       Current Outpatient Medications   Medication Sig Dispense Refill    donepezil (ARICEPT) 10 MG tablet Take 1 tablet by mouth nightly 30 tablet 5    memantine (NAMENDA) 5 MG tablet Take 1 tab by mouth nightly x 1 week then 1 tab twice daily. 180 tablet 1    apixaban (ELIQUIS) 5 MG TABS tablet Take 1 tablet by mouth 2 times daily 180 tablet 5    verapamil (VERELAN) 240 MG extended release capsule take 1 capsule by mouth once daily 90 capsule 2    metoprolol tartrate (LOPRESSOR) 50 MG tablet take 1 tablet by mouth twice a day 180 tablet 2    metoprolol tartrate (LOPRESSOR) 25 MG tablet take 1 tablet by mouth twice a day 180 tablet 2    fenofibrate (TRICOR) 145 MG tablet take 1 tablet by mouth once daily 90 tablet 2    vitamin E 1000 units capsule Take 1 capsule by mouth daily      meclizine (ANTIVERT) 25 MG tablet Take 1 tablet by mouth every 6 hours as needed for Dizziness 30 tablet 0    polyethyl glycol-propyl glycol 0.4-0.3 % (SYSTANE) 0.4-0.3 % ophthalmic solution Apply 2 drops to eye 2 times daily      calcium carbonate 1500 (600 Ca) MG TABS tablet Take 1 tablet by mouth daily      Cholecalciferol 50 MCG (2000 UT) TABS Take 1 tablet by mouth daily      cyanocobalamin 1000 MCG tablet Take 1 tablet by mouth daily  No current facility-administered medications for this visit.       There were no vitals taken for this visit.     Physical Exam  Vitals and nursing note reviewed.   Constitutional:       General: She is not in acute distress.      Appearance: Normal appearance. She is obese. She is not ill-appearing, toxic-appearing or diaphoretic.   HENT:      Head: Normocephalic and atraumatic.   Cardiovascular:      Rate and Rhythm: Normal rate. Rhythm irregular.      Pulses: Normal pulses.      Heart sounds: Normal heart sounds. No murmur heard.  Pulmonary:      Effort: Pulmonary effort is normal. No respiratory distress.      Breath sounds: Normal breath sounds.   Musculoskeletal:         General: Normal range of motion.      Cervical back: Normal range of motion and neck supple.      Right lower leg: No edema.      Left lower leg: No edema.   Skin:     General: Skin is warm and dry.   Neurological:      General: No focal deficit present.      Mental Status: She is alert. Mental status is at baseline.      Cranial Nerves: No cranial nerve deficit.      Gait: Gait abnormal.   Psychiatric:         Mood and Affect: Mood normal.         Behavior: Behavior normal.         Thought Content: Thought content normal.         Judgment: Judgment normal.          No visits with results within 3 Month(s) from this visit.   Latest known visit with results is:   Hospital Outpatient Visit on 03/26/2023   Component Date Value Ref Range Status    WBC 03/26/2023 8.3  4.6 - 13.2 K/uL Final    RBC 03/26/2023 4.92  4.20 - 5.30 M/uL Final    Hemoglobin 03/26/2023 15.0  12.0 - 16.0 g/dL Final    Hematocrit 16/09/9603 46.9 (H)  35.0 - 45.0 % Final    MCV 03/26/2023 95.3  78.0 - 100.0 FL Final    MCH 03/26/2023 30.5  24.0 - 34.0 PG Final    MCHC 03/26/2023 32.0  31.0 - 37.0 g/dL Final    RDW 54/08/8118 13.2  11.6 - 14.5 % Final    Platelets 03/26/2023 271  135 - 420 K/uL Final    MPV 03/26/2023 11.2  9.2 - 11.8 FL Final    Nucleated RBCs 03/26/2023 0.0  0 PER 100 WBC Final    nRBC 03/26/2023 0.00  0.00 - 0.01 K/uL Final    Neutrophils % 03/26/2023 65  40 - 73 % Final    Lymphocytes % 03/26/2023 26  21 - 52 % Final    Monocytes % 03/26/2023 8  3 - 10 % Final    Eosinophils %  03/26/2023 1  0 - 5 % Final    Basophils % 03/26/2023 0  0 - 2 % Final    Immature Granulocytes % 03/26/2023 0  0.0 - 0.5 % Final    Neutrophils Absolute 03/26/2023 5.4  1.8 - 8.0 K/UL Final    Lymphocytes Absolute 03/26/2023 2.2  0.9 - 3.6 K/UL Final    Monocytes Absolute  03/26/2023 0.6  0.05 - 1.2 K/UL Final    Eosinophils Absolute 03/26/2023 0.1  0.0 - 0.4 K/UL Final    Basophils Absolute 03/26/2023 0.0  0.0 - 0.1 K/UL Final    Immature Granulocytes Absolute 03/26/2023 0.0  0.00 - 0.04 K/UL Final    Differential Type 03/26/2023 AUTOMATED    Final    Sodium 03/26/2023 140  136 - 145 mmol/L Final    Potassium 03/26/2023 3.7  3.5 - 5.5 mmol/L Final    Chloride 03/26/2023 109  100 - 111 mmol/L Final    CO2 03/26/2023 26  21 - 32 mmol/L Final    Anion Gap 03/26/2023 5  3.0 - 18 mmol/L Final    Glucose 03/26/2023 97  74 - 99 mg/dL Final    BUN 16/09/9603 11  7.0 - 18 MG/DL Final    Creatinine 54/08/8118 0.83  0.6 - 1.3 MG/DL Final    BUN/Creatinine Ratio 03/26/2023 13  12 - 20   Final    Est, Glom Filt Rate 03/26/2023 68  >60 ml/min/1.35m2 Final    Comment:    Pediatric calculator link: https://www.kidney.org/professionals/kdoqi/gfr_calculatorped     These results are not intended for use in patients <52 years of age.     eGFR results are calculated without a race factor using  the 2021 CKD-EPI equation. Careful clinical correlation is recommended, particularly when comparing to results calculated using previous equations.  The CKD-EPI equation is less accurate in patients with extremes of muscle mass, extra-renal metabolism of creatinine, excessive creatine ingestion, or following therapy that affects renal tubular secretion.      Calcium 03/26/2023 10.3 (H)  8.5 - 10.1 MG/DL Final    Total Bilirubin 03/26/2023 0.5  0.2 - 1.0 MG/DL Final    ALT 14/78/2956 18  13 - 56 U/L Final    AST 03/26/2023 15  10 - 38 U/L Final    Alk Phosphatase 03/26/2023 56  45 - 117 U/L Final    Total Protein 03/26/2023 7.1  6.4 - 8.2 g/dL  Final    Albumin 21/30/8657 3.5  3.4 - 5.0 g/dL Final    Globulin 84/69/6295 3.6  2.0 - 4.0 g/dL Final    Albumin/Globulin Ratio 03/26/2023 1.0  0.8 - 1.7   Final    Ferritin 03/26/2023 139  8 - 388 NG/ML Final    Iron 03/26/2023 60  50 - 175 ug/dL Final    Patients receiving metal-binding drugs (e.g. deferoxamine) may show spuriously depressed iron values, as chelated iron may not properly react in the iron assay.    TIBC 03/26/2023 413  250 - 450 ug/dL Final    Iron % Saturation 03/26/2023 15 (L)  20 - 50 % Final    TSH, 3rd Generation 03/26/2023 1.71  0.36 - 3.74 uIU/mL Final    LIPID PANEL 03/26/2023      Final    Cholesterol, Total 03/26/2023 164  <200 MG/DL Final    Triglycerides 03/26/2023 117  <150 MG/DL Final    Comment: The drugs N-acetylcysteine (NAC) and  Metamiszole have been found to cause falsely  low results in this chemical assay. Please  be sure to submit blood samples obtained  BEFORE administration of either of these  drugs to assure correct results.      HDL 03/26/2023 54  40 - 60 MG/DL Final    LDL Calculated 03/26/2023 28.4  0 - 100 MG/DL Final    VLDL Cholesterol Calculated 03/26/2023 23.4  MG/DL Final  Chol/HDL Ratio 03/26/2023 3.0  0 - 5.0   Final       No results found for any visits on 02/01/24.    Patient Care Team:  Patient Care Team:  Cleon Gustin, MD as PCP - General  Cleon Gustin, MD as PCP - Empaneled Provider  Riddle, Gerome Apley, APRN - NP as Nurse Practitioner (Neurology)      Assessment / Plan:  No diagnosis found.       HTN: BP acceptable.  Continue verapamil, metoprolol and HCTZ.    Hypertriglyceridemia: Continue fenofibrate.     Dementia: Following with neurology.  Continue Aricept.     Atrial fibrillation: Advised to schedule follow-up appointment with cardiology.  Continue Eliquis, Lopressor 75 mg every day,verapamil.      Osteoarthritis of multiple joints: Patient advised to take Tylenol arthritis.    Labs reviewed and discussed    Advised to get COVID-19 booster, flu  shot and RSV vaccine    ACP documents received -patient is DNR    No follow-ups on file.     I asked the patient if she  had any questions and answered her  questions.  The patient stated that she understands the treatment plan and agrees with the treatment plan    This document was created with a voice activated dictation system and may contain transcription errors.

## 2024-04-11 ENCOUNTER — Inpatient Hospital Stay: Admit: 2024-04-11 | Payer: Medicare (Managed Care) | Primary: Internal Medicine

## 2024-04-11 DIAGNOSIS — I10 Essential (primary) hypertension: Secondary | ICD-10-CM

## 2024-04-11 LAB — CBC WITH AUTO DIFFERENTIAL
Basophils %: 0.1 % (ref 0.0–2.0)
Basophils Absolute: 0.01 10*3/uL (ref 0.00–0.10)
Eosinophils %: 0.8 % (ref 0.0–5.0)
Eosinophils Absolute: 0.06 10*3/uL (ref 0.00–0.40)
Hematocrit: 44.8 % (ref 35.0–45.0)
Hemoglobin: 14.6 g/dL (ref 12.0–16.0)
Immature Granulocytes %: 0.4 % (ref 0.0–0.5)
Immature Granulocytes Absolute: 0.03 10*3/uL (ref 0.00–0.04)
Lymphocytes %: 29.4 % (ref 21.0–52.0)
Lymphocytes Absolute: 2.2 10*3/uL (ref 0.90–3.60)
MCH: 31.1 pg (ref 24.0–34.0)
MCHC: 32.6 g/dL (ref 31.0–37.0)
MCV: 95.5 FL (ref 78.0–100.0)
MPV: 11.5 FL (ref 9.2–11.8)
Monocytes %: 8.2 % (ref 3.0–10.0)
Monocytes Absolute: 0.61 10*3/uL (ref 0.05–1.20)
Neutrophils %: 61.1 % (ref 40.0–73.0)
Neutrophils Absolute: 4.57 10*3/uL (ref 1.80–8.00)
Nucleated RBCs: 0 /100{WBCs}
Platelets: 251 10*3/uL (ref 135–420)
RBC: 4.69 M/uL (ref 4.20–5.30)
RDW: 12.9 % (ref 11.6–14.5)
WBC: 7.5 10*3/uL (ref 4.6–13.2)
nRBC: 0 10*3/uL (ref 0.00–0.01)

## 2024-04-12 LAB — LIPID PANEL
Chol/HDL Ratio: 3.1 (ref 0–5.0)
Cholesterol, Total: 159 mg/dL (ref <200)
HDL: 52 mg/dL (ref 40–60)
LDL Cholesterol: 70.2 mg/dL (ref 0–100)
Triglycerides: 184 mg/dL — ABNORMAL HIGH (ref <150)
VLDL Cholesterol Calculated: 36.8 mg/dL

## 2024-04-12 LAB — COMPREHENSIVE METABOLIC PANEL
ALT: 15 U/L (ref 13–56)
AST: 12 U/L (ref 10–38)
Albumin/Globulin Ratio: 1 (ref 0.8–1.7)
Albumin: 3.4 g/dL (ref 3.4–5.0)
Alk Phosphatase: 48 U/L (ref 45–117)
Anion Gap: 8 mmol/L (ref 3.0–18)
BUN/Creatinine Ratio: 20 (ref 12–20)
BUN: 17 mg/dL (ref 7.0–18)
CO2: 26 mmol/L (ref 21–32)
Calcium: 10.1 mg/dL (ref 8.5–10.1)
Chloride: 107 mmol/L (ref 100–111)
Creatinine: 0.83 mg/dL (ref 0.6–1.3)
Est, Glom Filt Rate: 68 mL/min/{1.73_m2} (ref >60)
Globulin: 3.3 g/dL (ref 2.0–4.0)
Glucose: 96 mg/dL (ref 74–99)
Potassium: 3.6 mmol/L (ref 3.5–5.5)
Sodium: 141 mmol/L (ref 136–145)
Total Bilirubin: 0.4 mg/dL (ref 0.2–1.0)
Total Protein: 6.7 g/dL (ref 6.4–8.2)

## 2024-04-17 ENCOUNTER — Ambulatory Visit
Admit: 2024-04-17 | Discharge: 2024-04-17 | Payer: TRICARE (CHAMPUS) | Attending: Internal Medicine | Primary: Internal Medicine

## 2024-04-17 VITALS — BP 149/98 | HR 97 | Temp 97.60000°F | Resp 16 | Ht 65.0 in | Wt 164.0 lb

## 2024-04-17 DIAGNOSIS — Z0000 Encounter for general adult medical examination without abnormal findings: Secondary | ICD-10-CM

## 2024-04-17 MED ORDER — FENOFIBRATE 145 MG PO TABS
145 | ORAL_TABLET | Freq: Every day | ORAL | 2 refills | 90.00000 days | Status: DC
Start: 2024-04-17 — End: 2024-07-01

## 2024-04-17 NOTE — Progress Notes (Signed)
 Medicare Annual Wellness Visit    Emily Henson is here for Medicare AWV    Assessment & Plan   Medicare annual wellness visit, subsequent    Patient is DNR       Return in about 6 months (around 10/18/2024).     Subjective       Patient's complete Health Risk Assessment and screening values have been reviewed and are found in Flowsheets. The following problems were reviewed today and where indicated follow up appointments were made and/or referrals ordered.    Positive Risk Factor Screenings with Interventions:    Fall Risk:  Do you feel unsteady or are you worried about falling? : (!) yes  2 or more falls in past year?: no  Fall with injury in past year?: no     Interventions:    Reviewed medications, home hazards, visual acuity, and co-morbidities that can increase risk for falls    Cognitive:      Words recalled: 2 Words Recalled     Total Score Interpretation: Abnormal Mini-Cog  Interventions:  Patient has dementia.  Unable to draw a clock-has a history of blindness.  She is following with neurology            Inactivity:  On average, how many days per week do you engage in moderate to strenuous exercise (like a brisk walk)?: 0 days (!) Abnormal  On average, how many minutes do you engage in exercise at this level?: 0 min  Interventions:  Daughter has been trying to get the patient to walk more      Dentist Screen:  Have you seen the dentist within the past year?: (!) No    Intervention:  Advised to schedule with their dentist     Vision Screen:  Do you have difficulty driving, watching TV, or doing any of your daily activities because of your eyesight?: (!) Yes  Have you had an eye exam within the past year?: (!) No  Interventions:    Patient has a history of blindness    Safety:  Do you have non-slip mats or non-slip surfaces or shower bars or grab bars in your shower or bathtub?: (!) No  Interventions:  Patient has a seated shower. Has grab bars    ADL's:   Patient reports needing help with:  Select all that  apply: (!) Grooming, Bathing, Walking/Balance  Select all that apply: [employer], Housekeeping, Banking/Finances, Shopping, Telephone Use, Presenter, broadcasting, Transportation, Taking Medications  Interventions:  Patient's daughter helps with ADLs                  Objective   Vitals:    04/17/24 1037   BP: (!) 149/98   BP Site: Right Upper Arm   Patient Position: Sitting   BP Cuff Size: Large Adult   Pulse: 97   Resp: 16   Temp: 97.6 F (36.4 C)   TempSrc: Oral   SpO2: 95%   Weight: 74.4 kg (164 lb)   Height: 1.651 m (5\' 5" )      Body mass index is 27.29 kg/m.                  Allergies   Allergen Reactions    Caffeine Palpitations    Other Rash     "Preservatives"     Prior to Visit Medications    Medication Sig Taking? Authorizing Provider   metoprolol  (LOPRESSOR ) 100 MG tablet Take 1 tablet by mouth 2 times daily Yes [provider]   fenofibrate  (TRICOR ) 145 MG tablet Take 1 tablet by mouth daily Yes Om Lizotte R, MD   donepezil  (ARICEPT ) 10 MG tablet Take 1 tablet by mouth nightly Yes Ona Bidding, APRN - NP   memantine  (NAMENDA ) 5 MG tablet Take 1 tab by mouth nightly x 1 week then 1 tab twice daily. Yes Ona Bidding, APRN - NP   apixaban  (ELIQUIS ) 5 MG TABS tablet Take 1 tablet by mouth 2 times daily Yes Jonathin Heinicke R, MD   verapamil  (VERELAN ) 240 MG extended release capsule take 1 capsule by mouth once daily Yes Donisha Hoch R, MD   vitamin E 1000 units capsule Take 1 capsule by mouth daily Yes [provider]   meclizine  (ANTIVERT ) 25 MG tablet Take 1 tablet by mouth every 6 hours as needed for Dizziness Yes Jahlani Lorentz R, MD   polyethyl glycol-propyl glycol 0.4-0.3 % (SYSTANE) 0.4-0.3 % ophthalmic solution Apply 2 drops to eye 2 times daily Yes [provider]   calcium carbonate 1500 (600 Ca) MG TABS tablet Take 1 tablet by mouth daily Yes [provider]   Cholecalciferol 50 MCG (2000 UT) TABS Take 1 tablet by mouth daily Yes Automatic Reconciliation, Ar    cyanocobalamin 1000 MCG tablet Take 1 tablet by mouth daily Yes Automatic Reconciliation, Ar       CareTeam (Including outside providers/suppliers regularly involved in providing care):   Patient Care Team:  Anton Kirsten, MD as PCP - General  Anton Kirsten, MD as PCP - Empaneled Provider  Riddle, Sherian Dimitri, APRN - NP as Nurse Practitioner (Neurology)     Recommendations for Preventive Services Due: see orders and patient instructions/AVS.  Recommended screening schedule for the next 5-10 years is provided to the patient in written form: see Patient Instructions/AVS.     Reviewed and updated this visit:  Tobacco  Allergies  Meds  Problems  Med Hx  Surg Hx  Fam Hx  Sexual   Hx      Sexual History: Patient declined to answer.

## 2024-04-17 NOTE — Progress Notes (Signed)
 Emily Henson is a 89 y.o. female presenting today for Medicare AWV  .     Chief Complaint   Patient presents with    Medicare AWV       HPI:  Emily Henson presents to the office today for follow up.    Patient has a PMHx of HTN, atrial fibrillation, SSS s/p PPM, blindness, hypertriglyceridemia, dementia.    Patient present today with her daughter.    HTN: Patient is on metoprolol , HCTZ and verapamil .       Atrial fibrillation: She has a diagnosis of SSS s/p pacemaker.  Currently on Eliquis , Lopressor  and verapamil .  Follows with cardiology.  Dose of metoprolol  was increased due to episodes of tachycardia and dyspnea.  Echo was ordered.  Patient is wearing a heart monitor.     Hypertriglyceridemia: Lipid panel in 03/2024 showed LDL 70.2.Emily Henson      Dementia: Patient is following with neurology.  She is experiencing worsening memory loss.  She has mobility issues due to blindness and poor balance..  Her Aricept  was increased to 10 mg nightly.  Declined neuropsychological evaluation.Emily Henson was recently started on Namenda  -daughter reports the patient has sometimes been more groggy after waking up from sleep.    Daughter reports patient has been more tired and drowsy.  Patient saw sleep medicine and was noted to have mild OSA.  She did not have any sleep related hypoxemia.        Review of Systems   Constitutional:  Negative for activity change, appetite change, chills, diaphoresis, fatigue, fever and unexpected weight change.   HENT:  Negative for congestion, nosebleeds, postnasal drip and rhinorrhea.    Eyes:  Positive for visual disturbance.   Respiratory:  Negative for cough, chest tightness, shortness of breath and wheezing.    Cardiovascular:  Negative for chest pain and leg swelling.   Gastrointestinal:  Negative for abdominal pain, blood in stool, diarrhea, nausea and vomiting.   Endocrine: Negative for polydipsia and polyphagia.   Genitourinary:  Negative for decreased urine volume, dysuria, frequency and pelvic  pain.   Musculoskeletal:  Positive for arthralgias and gait problem. Negative for back pain, myalgias and neck pain.   Neurological:  Negative for dizziness, tremors, seizures, syncope, speech difficulty, weakness, numbness and headaches.   Psychiatric/Behavioral:  Positive for sleep disturbance. Negative for agitation and confusion. The patient is not nervous/anxious.         Memory deficits   All other systems reviewed and are negative.        Allergies   Allergen Reactions    Caffeine Palpitations    Other Rash     "Preservatives"       PHQ Screening   PHQ-9 Total Score: 0 (04/17/2024 10:42 AM)           History  Past Medical History:   Diagnosis Date    Arrhythmia     Atrial Fibrillation    Arthritis     Atrial fibrillation (HCC)     CAD (coronary artery disease)     Dementia (HCC)     Hypercholesterolemia     hypertriglycerides    Hypertension        Past Surgical History:   Procedure Laterality Date    APPENDECTOMY      BREAST REDUCTION SURGERY  1990    CHOLECYSTECTOMY      COLONOSCOPY      GYN      hysterectomy    PACEMAKER      TOTAL COLECTOMY  TOTAL COLECTOMY         Social History     Socioeconomic History    Marital status: Widowed     Spouse name: Not on file    Number of children: Not on file    Years of education: Not on file    Highest education level: Not on file   Occupational History    Not on file   Tobacco Use    Smoking status: Former    Smokeless tobacco: Never   Substance and Sexual Activity    Alcohol use: Yes     Comment: occ    Drug use: No    Sexual activity: Defer   Other Topics Concern    Not on file   Social History Narrative    Not on file     Social Drivers of Health     Financial Resource Strain: Low Risk  (07/19/2023)    Overall Financial Resource Strain (CARDIA)     Difficulty of Paying Living Expenses: Not hard at all   Food Insecurity: No Food Insecurity (04/17/2024)    Hunger Vital Sign     Worried About Running Out of Food in the Last Year: Never true     Ran Out of Food in the  Last Year: Never true   Transportation Needs: No Transportation Needs (04/17/2024)    PRAPARE - Therapist, art (Medical): No     Lack of Transportation (Non-Medical): No   Physical Activity: Inactive (04/17/2024)    Exercise Vital Sign     Days of Exercise per Week: 0 days     Minutes of Exercise per Session: 0 min   Stress: Not on file   Social Connections: Feeling Socially Integrated (02/20/2023)    OASIS D0700: Social Isolation     Frequency of experiencing loneliness or isolation: Rarely   Intimate Partner Violence: Not on file   Housing Stability: Low Risk  (04/17/2024)    Housing Stability Vital Sign     Unable to Pay for Housing in the Last Year: No     Number of Times Moved in the Last Year: 0     Homeless in the Last Year: No       Current Outpatient Medications   Medication Sig Dispense Refill    metoprolol  (LOPRESSOR ) 100 MG tablet Take 1 tablet by mouth 2 times daily      fenofibrate  (TRICOR ) 145 MG tablet Take 1 tablet by mouth daily 90 tablet 2    donepezil  (ARICEPT ) 10 MG tablet Take 1 tablet by mouth nightly 30 tablet 5    memantine  (NAMENDA ) 5 MG tablet Take 1 tab by mouth nightly x 1 week then 1 tab twice daily. 180 tablet 1    apixaban  (ELIQUIS ) 5 MG TABS tablet Take 1 tablet by mouth 2 times daily 180 tablet 5    verapamil  (VERELAN ) 240 MG extended release capsule take 1 capsule by mouth once daily 90 capsule 2    vitamin E 1000 units capsule Take 1 capsule by mouth daily      meclizine  (ANTIVERT ) 25 MG tablet Take 1 tablet by mouth every 6 hours as needed for Dizziness 30 tablet 0    polyethyl glycol-propyl glycol 0.4-0.3 % (SYSTANE) 0.4-0.3 % ophthalmic solution Apply 2 drops to eye 2 times daily      calcium carbonate 1500 (600 Ca) MG TABS tablet Take 1 tablet by mouth daily      Cholecalciferol 50 MCG (  2000 UT) TABS Take 1 tablet by mouth daily      cyanocobalamin 1000 MCG tablet Take 1 tablet by mouth daily       No current facility-administered medications for this visit.        BP (!) 149/98 (BP Site: Right Upper Arm, Patient Position: Sitting, BP Cuff Size: Large Adult)   Pulse 97   Temp 97.6 F (36.4 C) (Oral)   Resp 16   Ht 1.651 m (5\' 5" )   Wt 74.4 kg (164 lb)   SpO2 95%   BMI 27.29 kg/m      Physical Exam  Vitals and nursing note reviewed.   Constitutional:       General: She is not in acute distress.     Appearance: Normal appearance. She is obese. She is not ill-appearing, toxic-appearing or diaphoretic.   HENT:      Head: Normocephalic and atraumatic.   Cardiovascular:      Rate and Rhythm: Normal rate. Rhythm irregular.      Pulses: Normal pulses.      Heart sounds: Normal heart sounds. No murmur heard.  Pulmonary:      Effort: Pulmonary effort is normal. No respiratory distress.      Breath sounds: Normal breath sounds.   Musculoskeletal:         General: Normal range of motion.      Cervical back: Normal range of motion and neck supple.      Right lower leg: No edema.      Left lower leg: No edema.   Skin:     General: Skin is warm and dry.   Neurological:      General: No focal deficit present.      Mental Status: She is alert. Mental status is at baseline.      Cranial Nerves: No cranial nerve deficit.      Gait: Gait abnormal.      Comments: Using a cane   Psychiatric:         Mood and Affect: Mood normal.         Behavior: Behavior normal.         Thought Content: Thought content normal.         Judgment: Judgment normal.          Hospital Outpatient Visit on 04/11/2024   Component Date Value Ref Range Status    WBC 04/11/2024 7.5  4.6 - 13.2 K/uL Final    RBC 04/11/2024 4.69  4.20 - 5.30 M/uL Final    Hemoglobin 04/11/2024 14.6  12.0 - 16.0 g/dL Final    Hematocrit 09/81/1914 44.8  35.0 - 45.0 % Final    MCV 04/11/2024 95.5  78.0 - 100.0 FL Final    MCH 04/11/2024 31.1  24.0 - 34.0 PG Final    MCHC 04/11/2024 32.6  31.0 - 37.0 g/dL Final    RDW 78/29/5621 12.9  11.6 - 14.5 % Final    Platelets 04/11/2024 251  135 - 420 K/uL Final    MPV 04/11/2024 11.5  9.2 -  11.8 FL Final    Nucleated RBCs 04/11/2024 0.0  0 PER 100 WBC Final    nRBC 04/11/2024 0.00  0.00 - 0.01 K/uL Final    Neutrophils % 04/11/2024 61.1  40.0 - 73.0 % Final    Lymphocytes % 04/11/2024 29.4  21.0 - 52.0 % Final    Monocytes % 04/11/2024 8.2  3.0 - 10.0 % Final    Eosinophils %  04/11/2024 0.8  0.0 - 5.0 % Final    Basophils % 04/11/2024 0.1  0.0 - 2.0 % Final    Immature Granulocytes % 04/11/2024 0.4  0.0 - 0.5 % Final    Neutrophils Absolute 04/11/2024 4.57  1.80 - 8.00 K/UL Final    Lymphocytes Absolute 04/11/2024 2.20  0.90 - 3.60 K/UL Final    Monocytes Absolute 04/11/2024 0.61  0.05 - 1.20 K/UL Final    Eosinophils Absolute 04/11/2024 0.06  0.00 - 0.40 K/UL Final    Basophils Absolute 04/11/2024 0.01  0.00 - 0.10 K/UL Final    Immature Granulocytes Absolute 04/11/2024 0.03  0.00 - 0.04 K/UL Final    Differential Type 04/11/2024 AUTOMATED    Final    LIPID PANEL 04/11/2024      Final    Cholesterol, Total 04/11/2024 159  <200 MG/DL Final    Triglycerides 04/11/2024 184 (H)  <150 MG/DL Final    Comment: The drugs N-acetylcysteine (NAC) and  Metamiszole have been found to cause falsely  low results in this chemical assay. Please  be sure to submit blood samples obtained  BEFORE administration of either of these  drugs to assure correct results.      HDL 04/11/2024 52  40 - 60 MG/DL Final    LDL Cholesterol 04/11/2024 70.2  0 - 100 MG/DL Final    VLDL Cholesterol Calculated 04/11/2024 36.8  MG/DL Final    Chol/HDL Ratio 04/11/2024 3.1  0 - 5.0   Final    Sodium 04/11/2024 141  136 - 145 mmol/L Final    Potassium 04/11/2024 3.6  3.5 - 5.5 mmol/L Final    Chloride 04/11/2024 107  100 - 111 mmol/L Final    CO2 04/11/2024 26  21 - 32 mmol/L Final    Anion Gap 04/11/2024 8  3.0 - 18 mmol/L Final    Glucose 04/11/2024 96  74 - 99 mg/dL Final    BUN 95/28/4132 17  7.0 - 18 MG/DL Final    Creatinine 44/12/270 0.83  0.6 - 1.3 MG/DL Final    BUN/Creatinine Ratio 04/11/2024 20  12 - 20   Final    Est, Glom Filt  Rate 04/11/2024 68  >60 ml/min/1.52m2 Final    Comment:    Pediatric calculator link: https://www.kidney.org/professionals/kdoqi/gfr_calculatorped     These results are not intended for use in patients <55 years of age.     eGFR results are calculated without a race factor using  the 2021 CKD-EPI equation. Careful clinical correlation is recommended, particularly when comparing to results calculated using previous equations.  The CKD-EPI equation is less accurate in patients with extremes of muscle mass, extra-renal metabolism of creatinine, excessive creatine ingestion, or following therapy that affects renal tubular secretion.      Calcium 04/11/2024 10.1  8.5 - 10.1 MG/DL Final    Total Bilirubin 04/11/2024 0.4  0.2 - 1.0 MG/DL Final    ALT 53/66/4403 15  13 - 56 U/L Final    AST 04/11/2024 12  10 - 38 U/L Final    Alk Phosphatase 04/11/2024 48  45 - 117 U/L Final    Total Protein 04/11/2024 6.7  6.4 - 8.2 g/dL Final    Albumin 47/42/5956 3.4  3.4 - 5.0 g/dL Final    Globulin 38/75/6433 3.3  2.0 - 4.0 g/dL Final    Albumin/Globulin Ratio 04/11/2024 1.0  0.8 - 1.7   Final       No results found for any visits on 04/17/24.  Patient Care Team:  Patient Care Team:  Anton Kirsten, MD as PCP - General  Anton Kirsten, MD as PCP - Empaneled Provider  Riddle, Sherian Dimitri, APRN - NP as Nurse Practitioner (Neurology)      Assessment / Plan:    ICD-10-CM    1. Essential (primary) hypertension  I10       2. Pure hyperglyceridemia  E78.1 fenofibrate  (TRICOR ) 145 MG tablet      3. Primary osteoarthritis involving multiple joints  M15.0       4. Permanent atrial fibrillation (HCC)  I48.21       5. Moderate Alzheimer's dementia without behavioral disturbance, psychotic disturbance, mood disturbance, or anxiety, unspecified timing of dementia onset (HCC)  G30.9     F02.B0         Labs reviewed and discussed with patient and daughter    HTN: BP acceptable.  Continue verapamil , metoprolol .    Hypertriglyceridemia: Continue  fenofibrate .     Dementia: Following with neurology.  Continue Aricept .  Recently started on Namenda .     Atrial fibrillation: Following with cardiology.  Echo showed permanent A-fib, EF 53%, no significant valvular disease.  She has a heart monitor placed.    Osteoarthritis of multiple joints: Patient advised to take Tylenol arthritis.    ACP documents received -patient is DNR    Return in about 6 months (around 10/18/2024).     I asked the patient if she  had any questions and answered her  questions.  The patient stated that she understands the treatment plan and agrees with the treatment plan    This document was created with a voice activated dictation system and may contain transcription errors.

## 2024-04-17 NOTE — Patient Instructions (Signed)
 Preventing Falls: Care Instructions  Injuries and health problems such as trouble walking or poor eyesight can increase your risk of falling. So can some medicines. But there are things you can do to help prevent falls. You can exercise to get stronger. You can also arrange your home to make it safer.    Talk to your doctor about the medicines you take. Ask if any of them increase the risk of falls and whether they can be changed or stopped.   Try to exercise regularly. It can help improve your strength and balance. This can help lower your risk of falling.         Practice fall safety and prevention.   Wear low-heeled shoes that fit well and give your feet good support. Talk to your doctor if you have foot problems that make this hard.  Carry a cellphone or wear a medical alert device that you can use to call for help.  Use stepladders instead of chairs to reach high objects. Don't climb if you're at risk for falls. Ask for help, if needed.  Wear the correct eyeglasses, if you need them.        Make your home safer.   Remove rugs, cords, clutter, and furniture from walkways.  Keep your house well lit. Use night-lights in hallways and bathrooms.  Install and use sturdy handrails on stairways.  Wear nonskid footwear, even inside. Don't walk barefoot or in socks without shoes.        Be safe outside.   Use handrails, curb cuts, and ramps whenever possible.  Keep your hands free by using a shoulder bag or backpack.  Try to walk in well-lit areas. Watch out for uneven ground, changes in pavement, and debris.  Be careful in the winter. Walk on the grass or gravel when sidewalks are slippery. Use de-icer on steps and walkways. Add non-slip devices to shoes.    Put grab bars and nonskid mats in your shower or tub and near the toilet. Try to use a shower chair or bath bench when bathing.   Get into a tub or shower by putting in your weaker leg first. Get out with your strong side first. Have a phone or medical alert  device in the bathroom with you.   Where can you learn more?  Go to [employer] and enter G117 to learn more about "Preventing Falls: Care Instructions."  Current as of: July 18, 2023  Content Version: 14.4   2024-2025 [place], [place].   Care instructions adapted under license by [facility]. If you have questions about a medical condition or this instruction, always ask your healthcare professional. Laren Player, [facility], disclaims any warranty or liability for your use of this information.         Learning About Emotional Support  When do you need emotional support?     You might find getting support from others helpful when you have a long-term health problem. Often people feel alone, confused, or scared when coping with an illness. But you aren't alone. Other people are going through the same thing you are and know how you feel.  Talking with others about your feelings can help you feel better.  Your family and friends can give you support. So can your doctor, a support group, or a church. If you have a support network, you will not feel as alone. You will learn new ways to deal with your situation, and you may try harder to overcome it.  Where you can get support  Family and friends: They can help you cope by giving you comfort and encouragement.  Counseling: Professional counseling can help you cope with situations that interfere with your life and cause stress. Counseling can help you understand and deal with your illness.  Your doctor: Find a doctor you trust and feel comfortable with. Be open and honest about your fears and concerns. Your doctor can help you get the right medical treatments, including counseling.  Spiritual or religious groups: They can provide comfort and may be able to help you find counseling or other social support services.  Social groups: They can help you meet new people and get involved in activities you enjoy.  Community support groups: In a  support group, you can talk to others who have dealt with the same problems or illness as you. You can encourage one another and learn ways to cope with tough emotions.  How can you find a support group?  Finding a support group that works for you may take time. There are many options. Some groups have a group leader who helps lead discussions or shares information. Others are less formal. Some meet in person, while others meet online.  Try using these resources to help you find the best support group for you.  Your doctor, health care team, or counselor.  People with the same health concern.  Your local church, mosque, synagogue, or other religious group.  A city, state, or national group that provides support for your health concern. Check your local library or community center for a list of these groups. Or look for information online.  Your local community, friends, and family.  Supportive relationships  A supportive relationship includes emotional support such as love, trust, and understanding, as well as advice and concrete help, such as help managing your time.  Reach out to others  Family and friends can help you. Ask them to:  Listen to you and give you encouragement. This can keep you from feeling hopeless or alone.  Help with small daily tasks or with bigger problems. A helping hand can keep you from feeling overwhelmed.  Help you manage a health problem. For example, ask them to go to doctor visits with you. Your loved ones can offer support by being involved in your medical care.  Respect your relationships  A good relationship is also a two-way street. You count on help from others, but they also count on you.  Know your friends' limits. You don't have to see or call your friends every day. If you are going through a rough patch, ask friends if you can contact them outside of the usual boundaries.  Don't always complain or talk about yourself. Know when it's time to stop talking and listen or just  enjoy your friend's company.  Know that good friends can be a bad influence. For example, if a friend encourages you to drink when you know it will harm you, you may want to end the friendship.  Where can you learn more?  Go to [employer] and enter G092 to learn more about "Learning About Emotional Support."  Current as of: July 18, 2023  Content Version: 14.4   2024-2025 [place], [place].   Care instructions adapted under license by [facility]. If you have questions about a medical condition or this instruction, always ask your healthcare professional. Laren Player, [facility], disclaims any warranty or liability for your use of this information.  Learning About Being Active as an Older Adult  Why is being active important as you get older?     Being active is one of the best things you can do for your health. And it's never too late to start. Being active--or getting active, if you aren't already--has definite benefits. It can:  Give you more energy,  Keep your mind sharp.  Improve balance to reduce your risk of falls.  Help you manage chronic illness with fewer medicines.  No matter how old you are, how fit you are, or what health problems you have, there is a form of activity that will work for you. And the more physical activity you can do, the better your overall health will be.  What kinds of activity can help you stay healthy?  Being more active will make your daily activities easier. Physical activity includes planned exercise and things you do in daily life. There are four types of activity:  Aerobic.  Doing aerobic activity makes your heart and lungs strong.  Includes walking, dancing, and gardening.  Aim for at least 2 hours spread throughout the week.  It improves your energy and can help you sleep better.  Muscle-strengthening.  This type of activity can help maintain muscle and strengthen bones.  Includes climbing stairs, using resistance bands, and lifting or  carrying heavy loads.  Aim for at least twice a week.  It can help protect the knees and other joints.  Stretching.  Stretching gives you better range of motion in joints and muscles.  Includes upper arm stretches, calf stretches, and gentle yoga.  Aim for at least twice a week, preferably after your muscles are warmed up from other activities.  It can help you function better in daily life.  Balancing.  This helps you stay coordinated and have good posture.  Includes heel-to-toe walking, tai chi, and certain types of yoga.  Aim for at least 3 days a week.  It can reduce your risk of falling.  Even if you have a hard time meeting the recommendations, it's better to be more active than less active. All activity done in each category counts toward your weekly total. You'd be surprised how daily things like carrying groceries, keeping up with grandchildren, and taking the stairs can add up.  What keeps you from being active?  If you've had a hard time being more active, you're not alone. Maybe you remember being able to do more. Or maybe you've never thought of yourself as being active. It's frustrating when you can't do the things you want. Being more active can help. What's holding you back?  Getting started.  Have a goal, but break it into easy tasks. Small steps build into big accomplishments.  Staying motivated.  If you feel like skipping your activity, remember your goal. Maybe you want to move better and stay independent. Every activity gets you one step closer.  Not feeling your best.  Start with 5 minutes of an activity you enjoy. Prove to yourself you can do it. As you get comfortable, increase your time.  You may not be where you want to be. But you're in the process of getting there. Everyone starts somewhere.  How can you find safe ways to stay active?  Talk with your doctor about any physical challenges you're facing. Make a plan with your doctor if you have a health problem or aren't sure how to get  started with activity.  If you're already active, ask your doctor  if there is anything you should change to stay safe as your body and health change.  If you tend to feel dizzy after you take medicine, avoid activity at that time. Try being active before you take your medicine. This will reduce your risk of falls.  If you plan to be active at home, make sure to clear your space before you get started. Remove things like TV cords, coffee tables, and throw rugs. It's safest to have plenty of space to move freely.  The key to getting more active is to take it slow and steady. Try to improve only a little bit at a time. Pick just one area to improve on at first. And if an activity hurts, stop and talk to your doctor.  Where can you learn more?  Go to [employer] and enter P600 to learn more about "Learning About Being Active as an Older Adult."  Current as of: July 18, 2023  Content Version: 14.4   2024-2025 [place], [place].   Care instructions adapted under license by [facility]. If you have questions about a medical condition or this instruction, always ask your healthcare professional. Laren Player, [facility], disclaims any warranty or liability for your use of this information.         Learning About Dental Care for Older Adults  Dental care for older adults: Overview  Dental care for older people is much the same as for younger adults. But older adults do have concerns that younger adults do not. Older adults may have problems with gum disease and decay on the roots of their teeth. They may need missing teeth replaced or broken fillings fixed. Or they may have dentures that need to be cared for. Some older adults may have trouble holding a toothbrush.  You can help remind the person you are caring for to brush and floss their teeth or to clean their dentures. In some cases, you may need to do the brushing and other dental care tasks. People who have trouble using their hands or who  have dementia may need this extra help.  How can you help with dental care?  Normal dental care  To keep the teeth and gums healthy:  Brush the teeth with fluoride toothpaste twice a day--in the morning and at night--and floss at least once a day. Plaque can quickly build up on the teeth of older adults.  Watch for the signs of gum disease. These signs include gums that bleed after brushing or after eating hard foods, such as apples.  See a dentist regularly. Many experts recommend checkups every 6 months.  Keep the dentist up to date on any new medications the person is taking.  Encourage a balanced diet that includes whole grains, vegetables, and fruits, and that is low in saturated fat and sodium.  Encourage the person you're caring for not to use tobacco products. They can affect dental and general health.  Many older adults have a fixed income and feel that they can't afford dental care. But most towns and cities have programs in which dentists help older adults by lowering fees. Contact your area's public health offices or social services for information about dental care in your area.  Using a toothbrush  Older adults with arthritis sometimes have trouble brushing their teeth because they can't easily hold the toothbrush. Their hands and fingers may be stiff, painful, or weak. If this is the case, you can:  Offer an Mining engineer toothbrush.  Enlarge the handle of  a non-electric toothbrush by wrapping a sponge, an elastic bandage, or adhesive tape around it.  Push the toothbrush handle through a ball made of rubber or soft foam.  Make the handle longer and thicker by taping Popsicle sticks or tongue depressors to it.  You may also be able to buy special toothbrushes, toothpaste dispensers, and floss holders.  Your doctor may recommend a soft-bristle toothbrush if the person you care for bleeds easily. Bleeding can happen because of a health problem or from certain medicines.  A toothpaste for sensitive teeth may  help if the person you care for has sensitive teeth.  How do you brush and floss someone's teeth?  If the person you are caring for has a hard time cleaning their teeth on their own, you may need to brush and floss their teeth for them. It may be easiest to have the person sit and face away from you, and to sit or stand behind them. That way you can steady their head against your arm as you reach around to floss and brush their teeth. Choose a place that has good lighting and is comfortable for both of you.  Before you begin, gather your supplies. You will need gloves, floss, a toothbrush, and a container to hold water if you are not near a sink. Wash and dry your hands well and put on gloves. Start by flossing:  Gently work a piece of floss between each of the teeth toward the gums. A plastic flossing tool may make this easier, and they are available at most drugstores.  Curve the floss around each tooth into a U-shape and gently slide it under the gum line.  Move the floss firmly up and down several times to scrape off the plaque.  After you've finished flossing, throw away the used floss and begin brushing:  Wet the brush and apply toothpaste.  Place the brush at a 45-degree angle where the teeth meet the gums. Press firmly, and move the brush in small circles over the surface of the teeth.  Be careful not to brush too hard. Vigorous brushing can make the gums pull away from the teeth and can scratch the tooth enamel.  Brush all surfaces of the teeth, on the tongue side and on the cheek side. Pay special attention to the front teeth and all surfaces of the back teeth.  Brush chewing surfaces with short back-and-forth strokes.  After you've finished, help the person rinse the remaining toothpaste from their mouth.  Where can you learn more?  Go to [employer] and enter F944 to learn more about "Learning About Dental Care for Older Adults."  Current as of: July 18, 2023  Content Version:  14.4   2024-2025 [place], [place].   Care instructions adapted under license by [facility]. If you have questions about a medical condition or this instruction, always ask your healthcare professional. Laren Player, [facility], disclaims any warranty or liability for your use of this information.         Learning About Vision Tests  What are vision tests?     The four most common vision tests are visual acuity tests, refraction, visual field tests, and color vision tests.  Visual acuity (sharpness) tests  These tests are used:  To see if you need glasses or contact lenses.  To monitor an eye problem.  To check an eye injury.  Visual acuity tests are done as part of routine exams. You may also have this test when you  get your driver's license or apply for some types of jobs.  Visual field tests  These tests are used:  To check for vision loss in any area of your range of vision.  To screen for certain eye diseases.  To look for nerve damage after a stroke, head injury, or other problem that could reduce blood flow to the brain.  Refraction and color tests  A refraction test is done to find the right prescription for glasses and contact lenses.  A color vision test is done to check for color blindness.  Color vision is often tested as part of a routine exam. You may also have this test when you apply for a job where recognizing different colors is important, such as truck driving, Optician, dispensing, or the [employer].  How are vision tests done?  Visual acuity test   You cover one eye at a time.  You read aloud from a wall chart across the room.  You read aloud from a small card that you hold in your hand.  Refraction   You look into a special device.  The device puts lenses of different strengths in front of each eye to see how strong your glasses or contact lenses need to be.  Visual field tests   Your doctor may have you look through special machines.  Or your doctor may simply have you stare straight ahead while  they move a finger into and out of your field of vision.  Color vision test   You look at pieces of printed test patterns in various colors. You say what number or symbol you see.  Your doctor may have you trace the number or symbol using a pointer.  How do these tests feel?  There is very little chance of having a problem from this test. If dilating drops are used for a vision test, they may make the eyes sting and cause a medicine taste in the mouth.  Follow-up care is a key part of your treatment and safety. Be sure to make and go to all appointments, and call your doctor if you are having problems. It's also a good idea to know your test results and keep a list of the medicines you take.  Where can you learn more?  Go to [employer] and enter G551 to learn more about "Learning About Vision Tests."  Current as of: July 18, 2023  Content Version: 14.4   2024-2025 [place], [place].   Care instructions adapted under license by [facility]. If you have questions about a medical condition or this instruction, always ask your healthcare professional. Laren Player, [facility], disclaims any warranty or liability for your use of this information.         Learning About Activities of Daily Living  What are activities of daily living?     Activities of daily living (ADLs) are the basic self-care tasks you do every day. These include eating, bathing, dressing, and moving around.  As you age, and if you have health problems, you may find that it's harder to do some of these tasks. If so, your doctor can suggest ideas that may help.  To measure what kind of help you may need, your doctor will ask how well you are able to do ADLs. Let your doctor know if there are any tasks that you are having trouble doing. This is an important first step to getting help. And when you have the help you need, you can stay as independent as  possible.  How will a doctor assess your ADLs?  Asking about ADLs is part  of a routine health checkup your doctor will likely do as you age. Your health check might be done in a doctor's office, in your home, or at a hospital. The goal is to find out if you are having any problems that could make it hard to care for yourself or that make it unsafe for you to be on your own.  To measure your ADLs, your doctor will ask how hard it is for you to do routine tasks. Your doctor may also want to know if you have changed the way you do a task because of a health problem. Your doctor may watch how you:  Walk back and forth.  Keep your balance while you stand or walk.  Move from sitting to standing or from a bed to a chair.  Button or unbutton a [employer] fast streamer.  Remove and put on your shoes.  It's common to feel a little worried or anxious if you find you can't do all the things you used to be able to do. Talking with your doctor about ADLs is a way to make sure you're as safe as possible and able to care for yourself as well as you can. You may want to bring a caregiver, friend, or family member to your checkup. They can help you talk to your doctor.  Follow-up care is a key part of your treatment and safety. Be sure to make and go to all appointments, and call your doctor if you are having problems. It's also a good idea to know your test results and keep a list of the medicines you take.  Current as of: October 11, 2023  Content Version: 14.4   2024-2025 [place], [place].   Care instructions adapted under license by [facility]. If you have questions about a medical condition or this instruction, always ask your healthcare professional. Laren Player, [facility], disclaims any warranty or liability for your use of this information.         A Healthy Heart: Care Instructions  Overview     Coronary artery disease, also called heart disease, occurs when a substance called plaque builds up in the vessels that supply oxygen-rich blood to your heart muscle. This can narrow the blood vessels and  reduce blood flow. A heart attack happens when blood flow is completely blocked. A high-fat diet, smoking, and other factors increase the risk of heart disease.  Your doctor has found that you have a chance of having heart disease. A heart-healthy lifestyle can help keep your heart healthy and prevent heart disease. This lifestyle includes eating healthy, being active, staying at a weight that's healthy for you, and not smoking or using tobacco. It also includes taking medicines as directed, managing other health conditions, and trying to get a healthy amount of sleep.  Follow-up care is a key part of your treatment and safety. Be sure to make and go to all appointments, and call your doctor if you are having problems. It's also a good idea to know your test results and keep a list of the medicines you take.  How can you care for yourself at home?  Diet    Use less salt when you cook and eat. This helps lower your blood pressure. Taste food before salting. Add only a little salt when you think you need it. With time, your taste buds will adjust to less salt.  Eat fewer snack items, fast foods, canned soups, and other high-salt, high-fat, processed foods.     Read food labels and try to avoid saturated and trans fats. They increase your risk of heart disease by raising cholesterol levels.     Limit the amount of solid fat--butter, margarine, and shortening--you eat. Use olive, peanut, or canola oil when you cook. Bake, broil, and steam foods instead of frying them.     Eat a variety of fruit and vegetables every day. Dark green, deep orange, red, or yellow fruits and vegetables are especially good for you. Examples include spinach, carrots, peaches, and berries.     Foods high in fiber can reduce your cholesterol and provide important vitamins and minerals. High-fiber foods include whole-grain cereals and breads, oatmeal, beans, brown rice, citrus fruits, and apples.     Eat lean proteins. Heart-healthy proteins  include seafood, lean meats and poultry, eggs, beans, peas, nuts, seeds, and soy products.     Limit drinks and foods with added sugar. These include candy, desserts, and soda pop.   Heart-healthy lifestyle    If your doctor recommends it, get more exercise. For many people, walking is a good choice. Or you may want to swim, bike, or do other activities. Bit by bit, increase the time you're active every day. Try for at least 30 minutes on most days of the week.     Try to quit or cut back on using tobacco and other nicotine products. This includes smoking and vaping. If you need help quitting, talk to your doctor about stop-smoking programs and medicines. These can increase your chances of quitting for good. Quitting is one of the most important things you can do to protect your heart. It is never too late to quit. Try to avoid secondhand smoke too.     Stay at a weight that's healthy for you. Talk to your doctor if you need help losing weight.     Try to get 7 to 9 hours of sleep each night.     Limit alcohol to 2 drinks a day for men and 1 drink a day for women. Too much alcohol can cause health problems.     Manage other health problems such as diabetes, high blood pressure, and high cholesterol. If you think you may have a problem with alcohol or drug use, talk to your doctor.   Medicines    Take your medicines exactly as prescribed. Call your doctor if you think you are having a problem with your medicine.     If your doctor recommends aspirin, take the amount directed each day. Make sure you take aspirin and not another kind of pain reliever, such as acetaminophen (Tylenol).   When should you call for help?   Call 911 if you have symptoms of a heart attack. These may include:    Chest pain or pressure, or a strange feeling in the chest.     Sweating.     Shortness of breath.     Pain, pressure, or a strange feeling in the back, neck, jaw, or upper belly or in one or both shoulders or arms.     Lightheadedness  or sudden weakness.     A fast or irregular heartbeat.   After you call 911, the operator may tell you to chew 1 adult-strength or 2 to 4 low-dose aspirin. Wait for an ambulance. Do not try to drive yourself.  Watch closely for changes in your health, and be  sure to contact your doctor if you have any problems.  Where can you learn more?  Go to [employer] and enter F075 to learn more about "A Healthy Heart: Care Instructions."  Current as of: July 18, 2023  Content Version: 14.4   2024-2025 [place], [place].   Care instructions adapted under license by [facility]. If you have questions about a medical condition or this instruction, always ask your healthcare professional. Laren Player, [facility], disclaims any warranty or liability for your use of this information.    Personalized Preventive Plan for Emily Henson - 04/17/2024  Medicare offers a range of preventive health benefits. Some of the tests and screenings are paid in full while other may be subject to a deductible, co-insurance, and/or copay.  Some of these benefits include a comprehensive review of your medical history including lifestyle, illnesses that may run in your family, and various assessments and screenings as appropriate.  After reviewing your medical record and screening and assessments performed today your provider may have ordered immunizations, labs, imaging, and/or referrals for you.  A list of these orders (if applicable) as well as your Preventive Care list are included within your After Visit Summary for your review.

## 2024-04-21 ENCOUNTER — Encounter: Payer: Medicare (Managed Care) | Attending: Acute Care | Primary: Internal Medicine

## 2024-05-05 ENCOUNTER — Encounter

## 2024-05-14 NOTE — Addendum Note (Signed)
 Addended by: Mehdi Gironda A on: 05/14/2024 11:46 PM     Modules accepted: Orders

## 2024-05-15 MED ORDER — DONEPEZIL HCL 10 MG PO TABS
10 | ORAL_TABLET | Freq: Every evening | ORAL | 0 refills | 60.00000 days | Status: DC
Start: 2024-05-15 — End: 2024-05-14

## 2024-05-15 MED ORDER — DONEPEZIL HCL 10 MG PO TABS
10 | ORAL_TABLET | Freq: Every day | ORAL | 0 refills | 28.00000 days | Status: DC
Start: 2024-05-15 — End: 2024-05-21

## 2024-05-20 ENCOUNTER — Telehealth

## 2024-05-20 NOTE — Telephone Encounter (Signed)
 Patient requesting refill on donepezil

## 2024-05-23 MED ORDER — DONEPEZIL HCL 10 MG PO TABS
10 | ORAL_TABLET | Freq: Every day | ORAL | 0 refills | 30.00000 days | Status: DC
Start: 2024-05-23 — End: 2024-05-28

## 2024-05-27 ENCOUNTER — Telehealth

## 2024-05-27 NOTE — Telephone Encounter (Signed)
 Please advise allergy clarification request scanned into media

## 2024-05-28 MED ORDER — DONEPEZIL HCL 10 MG PO TABS
10 | ORAL_TABLET | Freq: Every day | ORAL | 0 refills | 30.00000 days | Status: DC
Start: 2024-05-28 — End: 2024-12-16

## 2024-07-01 ENCOUNTER — Encounter

## 2024-07-01 MED ORDER — VERAPAMIL HCL ER 240 MG PO CP24
240 | ORAL_CAPSULE | Freq: Every day | ORAL | 2 refills | 90.00000 days | Status: DC
Start: 2024-07-01 — End: 2024-12-16

## 2024-07-01 MED ORDER — FENOFIBRATE 145 MG PO TABS
145 | ORAL_TABLET | Freq: Every day | ORAL | 1 refills | 90.00000 days | Status: DC
Start: 2024-07-01 — End: 2024-12-16

## 2024-08-29 NOTE — Telephone Encounter (Signed)
 [facility] medical records scanned in chart

## 2024-10-21 ENCOUNTER — Ambulatory Visit
Admit: 2024-10-21 | Discharge: 2024-10-21 | Payer: TRICARE (CHAMPUS) | Attending: Internal Medicine | Primary: Internal Medicine

## 2024-10-21 VITALS — BP 128/82 | HR 97 | Ht 65.0 in | Wt 176.0 lb

## 2024-10-21 DIAGNOSIS — F02B Dementia in other diseases classified elsewhere, moderate, without behavioral disturbance, psychotic disturbance, mood disturbance, and anxiety (HCC): Principal | ICD-10-CM

## 2024-10-21 NOTE — Progress Notes (Signed)
 "Emily Henson is a 89 y.o. female presenting today for Follow-up  .     Chief Complaint   Patient presents with    Follow-up       HPI:  Emily Henson presents to the office today for follow up.    Patient has a PMHx of HTN, atrial fibrillation, SSS s/p PPM, blindness, hypertriglyceridemia, dementia.    Patient present today with her daughter.    Patient had a home health aide through insurance but no longer covered.  They are looking to get a home health aide through [facility].  Requesting form completion.    HTN: Patient is on metoprolol , HCTZ and verapamil .  BP is controlled.     Atrial fibrillation: She has a diagnosis of SSS s/p pacemaker.  Currently on Eliquis , Lopressor  and verapamil .  Follows with cardiology.  Patient had episodes of dyspnea on exertion.  Holter and repeat echo was done.  Echo showed normal LV function and Holter showing normal ventricular rate average.  Dose of metoprolol  was increased to 100 mg twice daily.     Hypertriglyceridemia: Lipid panel in 03/2024 showed LDL 70.2.SABRA      Dementia: Patient is following with neurology.  She is experiencing worsening memory loss.  She has mobility issues due to blindness and poor balance..  Her Aricept  was increased to 10 mg nightly.  Declined neuropsychological evaluation.SABRAshe was started on Namenda     Daughter reports patient has been more tired and drowsy.  Patient saw sleep medicine and was noted to have mild OSA.  She did not have any sleep related hypoxemia.    Due to the dementia, blindness and mobility issues-patient needs help at home with her ADLs.    Review of Systems   Constitutional:  Negative for activity change, appetite change, chills, diaphoresis, fatigue, fever and unexpected weight change.   HENT:  Negative for congestion, nosebleeds, postnasal drip and rhinorrhea.    Eyes:  Positive for visual disturbance.   Respiratory:  Negative for cough, chest tightness, shortness of breath and wheezing.    Cardiovascular:  Negative for chest  pain and leg swelling.   Gastrointestinal:  Negative for abdominal pain, blood in stool, diarrhea, nausea and vomiting.   Endocrine: Negative for polydipsia and polyphagia.   Genitourinary:  Negative for decreased urine volume, dysuria, frequency and pelvic pain.   Musculoskeletal:  Positive for arthralgias and gait problem. Negative for back pain, myalgias and neck pain.   Neurological:  Negative for dizziness, tremors, seizures, syncope, speech difficulty, weakness, numbness and headaches.   Psychiatric/Behavioral:  Positive for sleep disturbance. Negative for agitation and confusion. The patient is not nervous/anxious.         Memory deficits   All other systems reviewed and are negative.        Allergies   Allergen Reactions    Caffeine Palpitations    Other Rash     Preservatives       PHQ Screening   PHQ-9 Total Score: 0 (10/21/2024 10:50 AM)             History  Past Medical History:   Diagnosis Date    Arrhythmia     Atrial Fibrillation    Arthritis     Atrial fibrillation (HCC)     CAD (coronary artery disease)     Dementia (HCC)     Hypercholesterolemia     hypertriglycerides    Hypertension        Past Surgical History:   Procedure Laterality Date  APPENDECTOMY      BREAST REDUCTION SURGERY  1990    CHOLECYSTECTOMY      COLONOSCOPY      GYN      hysterectomy    PACEMAKER      TOTAL COLECTOMY      TOTAL COLECTOMY         Social History     Socioeconomic History    Marital status: Widowed     Spouse name: Not on file    Number of children: Not on file    Years of education: Not on file    Highest education level: Not on file   Occupational History    Not on file   Tobacco Use    Smoking status: Former    Smokeless tobacco: Never   Substance and Sexual Activity    Alcohol  use: Yes     Comment: occ    Drug use: No    Sexual activity: Not Currently   Other Topics Concern    Not on file   Social History Narrative    Not on file     Social Drivers of Health     Financial Resource Strain: Low Risk  (07/19/2023)     Overall Financial Resource Strain (CARDIA)     Difficulty of Paying Living Expenses: Not hard at all   Food Insecurity: No Food Insecurity (10/21/2024)    Hunger Vital Sign     Worried About Running Out of Food in the Last Year: Never true     Ran Out of Food in the Last Year: Never true   Transportation Needs: No Transportation Needs (10/21/2024)    PRAPARE - Therapist, Art (Medical): No     Lack of Transportation (Non-Medical): No   Physical Activity: Inactive (04/17/2024)    Exercise Vital Sign     Days of Exercise per Week: 0 days     Minutes of Exercise per Session: 0 min   Stress: Not on file   Social Connections: Feeling Socially Integrated (02/20/2023)    OASIS D0700: Social Isolation     Frequency of experiencing loneliness or isolation: Rarely   Intimate Partner Violence: Not on file   Housing Stability: Low Risk  (10/21/2024)    Housing Stability Vital Sign     Unable to Pay for Housing in the Last Year: No     Number of Times Moved in the Last Year: 0     Homeless in the Last Year: No       Current Outpatient Medications   Medication Sig Dispense Refill    verapamil  (VERELAN ) 240 MG extended release capsule TAKE 1 CAPSULE BY MOUTH ONCE DAILY 90 capsule 2    fenofibrate  (TRICOR ) 145 MG tablet TAKE 1 TABLET BY MOUTH DAILY 90 tablet 1    donepezil  (ARICEPT ) 10 MG tablet Take 1 tablet by mouth daily 90 tablet 0    metoprolol  (LOPRESSOR ) 100 MG tablet Take 1 tablet by mouth 2 times daily      memantine  (NAMENDA ) 5 MG tablet Take 1 tab by mouth nightly x 1 week then 1 tab twice daily. 180 tablet 1    apixaban  (ELIQUIS ) 5 MG TABS tablet Take 1 tablet by mouth 2 times daily 180 tablet 5    vitamin E  1000 units capsule Take 1 capsule by mouth daily      meclizine  (ANTIVERT ) 25 MG tablet Take 1 tablet by mouth every 6 hours as needed for Dizziness 30  tablet 0    polyethyl glycol-propyl glycol 0.4-0.3 % (SYSTANE) 0.4-0.3 % ophthalmic solution Apply 2 drops to eye 2 times daily      calcium   carbonate 1500 (600 Ca) MG TABS tablet Take 1 tablet by mouth daily      Cholecalciferol  50 MCG (2000 UT) TABS Take 1 tablet by mouth daily      cyanocobalamin  1000 MCG tablet Take 1 tablet by mouth daily       No current facility-administered medications for this visit.       BP 128/82   Pulse 97   Ht 1.651 m (5' 5)   Wt 79.8 kg (176 lb)   SpO2 97%   BMI 29.29 kg/m      Physical Exam  Vitals and nursing note reviewed.   Constitutional:       General: She is not in acute distress.     Appearance: Normal appearance. She is obese. She is not ill-appearing, toxic-appearing or diaphoretic.   HENT:      Head: Normocephalic and atraumatic.   Cardiovascular:      Rate and Rhythm: Normal rate. Rhythm irregular.      Pulses: Normal pulses.      Heart sounds: Normal heart sounds. No murmur heard.  Pulmonary:      Effort: Pulmonary effort is normal. No respiratory distress.      Breath sounds: Normal breath sounds. No wheezing or rales.   Musculoskeletal:      Cervical back: Normal range of motion and neck supple.      Right lower leg: No edema.      Left lower leg: No edema.   Skin:     General: Skin is warm and dry.   Neurological:      Mental Status: She is alert. Mental status is at baseline.      Cranial Nerves: No cranial nerve deficit.      Gait: Gait abnormal.      Comments: Using a cane   Psychiatric:         Mood and Affect: Mood normal.         Behavior: Behavior normal.         Thought Content: Thought content normal.         Judgment: Judgment normal.          No visits with results within 3 Month(s) from this visit.   Latest known visit with results is:   Hospital Outpatient Visit on 04/11/2024   Component Date Value Ref Range Status    WBC 04/11/2024 7.5  4.6 - 13.2 K/uL Final    RBC 04/11/2024 4.69  4.20 - 5.30 M/uL Final    Hemoglobin 04/11/2024 14.6  12.0 - 16.0 g/dL Final    Hematocrit 95/74/7974 44.8  35.0 - 45.0 % Final    MCV 04/11/2024 95.5  78.0 - 100.0 FL Final    MCH 04/11/2024 31.1  24.0 -  34.0 PG Final    MCHC 04/11/2024 32.6  31.0 - 37.0 g/dL Final    RDW 95/74/7974 12.9  11.6 - 14.5 % Final    Platelets 04/11/2024 251  135 - 420 K/uL Final    MPV 04/11/2024 11.5  9.2 - 11.8 FL Final    Nucleated RBCs 04/11/2024 0.0  0 PER 100 WBC Final    nRBC 04/11/2024 0.00  0.00 - 0.01 K/uL Final    Neutrophils % 04/11/2024 61.1  40.0 - 73.0 % Final    Lymphocytes % 04/11/2024  29.4  21.0 - 52.0 % Final    Monocytes % 04/11/2024 8.2  3.0 - 10.0 % Final    Eosinophils % 04/11/2024 0.8  0.0 - 5.0 % Final    Basophils % 04/11/2024 0.1  0.0 - 2.0 % Final    Immature Granulocytes % 04/11/2024 0.4  0.0 - 0.5 % Final    Neutrophils Absolute 04/11/2024 4.57  1.80 - 8.00 K/UL Final    Lymphocytes Absolute 04/11/2024 2.20  0.90 - 3.60 K/UL Final    Monocytes Absolute 04/11/2024 0.61  0.05 - 1.20 K/UL Final    Eosinophils Absolute 04/11/2024 0.06  0.00 - 0.40 K/UL Final    Basophils Absolute 04/11/2024 0.01  0.00 - 0.10 K/UL Final    Immature Granulocytes Absolute 04/11/2024 0.03  0.00 - 0.04 K/UL Final    Differential Type 04/11/2024 AUTOMATED    Final    LIPID PANEL 04/11/2024      Final    Cholesterol, Total 04/11/2024 159  <200 MG/DL Final    Triglycerides 04/11/2024 184 (H)  <150 MG/DL Final    Comment: The drugs N-acetylcysteine (NAC) and  Metamiszole have been found to cause falsely  low results in this chemical assay. Please  be sure to submit blood samples obtained  BEFORE administration of either of these  drugs to assure correct results.      HDL 04/11/2024 52  40 - 60 MG/DL Final    LDL Cholesterol 04/11/2024 70.2  0 - 100 MG/DL Final    VLDL Cholesterol Calculated 04/11/2024 36.8  MG/DL Final    Chol/HDL Ratio 04/11/2024 3.1  0 - 5.0   Final    Sodium 04/11/2024 141  136 - 145 mmol/L Final    Potassium 04/11/2024 3.6  3.5 - 5.5 mmol/L Final    Chloride 04/11/2024 107  100 - 111 mmol/L Final    CO2 04/11/2024 26  21 - 32 mmol/L Final    Anion Gap 04/11/2024 8  3.0 - 18 mmol/L Final    Glucose 04/11/2024 96  74 -  99 mg/dL Final    BUN 95/74/7974 17  7.0 - 18 MG/DL Final    Creatinine 95/74/7974 0.83  0.6 - 1.3 MG/DL Final    BUN/Creatinine Ratio 04/11/2024 20  12 - 20   Final    Est, Glom Filt Rate 04/11/2024 68  >60 ml/min/1.77m2 Final    Comment:    Pediatric calculator link: https://www.kidney.org/professionals/kdoqi/gfr_calculatorped     These results are not intended for use in patients <73 years of age.     eGFR results are calculated without a race factor using  the 2021 CKD-EPI equation. Careful clinical correlation is recommended, particularly when comparing to results calculated using previous equations.  The CKD-EPI equation is less accurate in patients with extremes of muscle mass, extra-renal metabolism of creatinine, excessive creatine ingestion, or following therapy that affects renal tubular secretion.      Calcium  04/11/2024 10.1  8.5 - 10.1 MG/DL Final    Total Bilirubin 04/11/2024 0.4  0.2 - 1.0 MG/DL Final    ALT 95/74/7974 15  13 - 56 U/L Final    AST 04/11/2024 12  10 - 38 U/L Final    Alk Phosphatase 04/11/2024 48  45 - 117 U/L Final    Total Protein 04/11/2024 6.7  6.4 - 8.2 g/dL Final    Albumin 95/74/7974 3.4  3.4 - 5.0 g/dL Final    Globulin 95/74/7974 3.3  2.0 - 4.0 g/dL Final  Albumin/Globulin Ratio 04/11/2024 1.0  0.8 - 1.7   Final       No results found for any visits on 10/21/24.    Patient Care Team:  Patient Care Team:  Cephas Cletis SAUNDERS, MD as PCP - General  Cephas Cletis SAUNDERS, MD as PCP - Empaneled Provider  Riddle, Leita LABOR, APRN - NP as Nurse Practitioner (Neurology)      Assessment / Plan:    ICD-10-CM    1. Moderate Alzheimer's dementia without behavioral disturbance, psychotic disturbance, mood disturbance, or anxiety, unspecified timing of dementia onset (HCC)  G30.9     F02.B0       2. Encounter for completion of form with patient  Z02.89       3. Pure hyperglyceridemia  E78.1 Lipid Panel      4. Primary osteoarthritis involving multiple joints  M15.0       5. Permanent atrial  fibrillation (HCC)  I48.21       6. Gait instability  R26.81       7. Essential (primary) hypertension  I10 CBC     Comprehensive Metabolic Panel      8. At high risk for falls  Z91.81             HTN: BP acceptable.  Continue verapamil , metoprolol .    Hypertriglyceridemia: Continue fenofibrate .     Dementia: Following with neurology-counseled to schedule follow-up appointment.  Continue Aricept  and Namenda      Atrial fibrillation: Following with cardiology.  Note reviewed.  Continue Eliquis .  She is on diltiazem and Lopressor .    Osteoarthritis of multiple joints: Continue Tylenol  as needed.    ACP documents received -patient is DNR    Patient has already received the flu shot.    Labs ordered    Form completed    On this date 10/21/24 I have spent >40 minutes reviewing previous notes, test results, and other pertinent medical information with the patient, discussing the diagnosis and importance of compliance with the treatment plan as well as documenting, completing forms on the day of the visit.     Return in about 6 months (around 04/20/2025).     I asked the patient if she  had any questions and answered her  questions.  The patient stated that she understands the treatment plan and agrees with the treatment plan    This document was created with a voice activated dictation system and may contain transcription errors.       "

## 2024-12-02 ENCOUNTER — Inpatient Hospital Stay
Admission: EM | Admit: 2024-12-02 | Discharge: 2024-12-02 | Disposition: A | Discharge status: Skilled Nursing Facility | Payer: Medicare (Managed Care) | Arrived: AM | Attending: Emergency Medicine | Admitting: Family Medicine

## 2024-12-02 ENCOUNTER — Emergency Department: Admit: 2024-12-02 | Payer: Medicare (Managed Care) | Primary: Internal Medicine

## 2024-12-02 ENCOUNTER — Inpatient Hospital Stay: Payer: Medicare (Managed Care) | Primary: Internal Medicine

## 2024-12-02 DIAGNOSIS — I639 Cerebral infarction, unspecified: Principal | ICD-10-CM

## 2024-12-02 DIAGNOSIS — I6389 Other cerebral infarction: Secondary | ICD-10-CM

## 2024-12-02 LAB — RESPIRATORY PANEL, MOLECULAR, WITH COVID-19

## 2024-12-02 LAB — COMPREHENSIVE METABOLIC PANEL
ALT: 13 U/L (ref 10–35)
AST: 16 U/L (ref 10–38)
Albumin/Globulin Ratio: 1.1 (ref 0.8–1.7)
Albumin: 3.4 g/dL (ref 3.4–5.0)
Alk Phosphatase: 48 U/L (ref 45–117)
Anion Gap: 10 mmol/L (ref 3.0–18.0)
BUN/Creatinine Ratio: 18 (ref 12–20)
BUN: 12 mg/dL (ref 6–23)
CO2: 28 mmol/L (ref 21–32)
Calcium: 11.4 mg/dL — ABNORMAL HIGH (ref 8.6–10.2)
Chloride: 103 mmol/L (ref 98–107)
Creatinine: 0.67 mg/dL (ref 0.6–1.3)
Est, Glom Filt Rate: 83 ml/min/1.73m2 (ref >60)
Globulin: 3.1 g/dL (ref 2.0–4.0)
Glucose: 128 mg/dL — ABNORMAL HIGH (ref 74–108)
Potassium: 3.9 mmol/L (ref 3.5–5.5)
Sodium: 141 mmol/L (ref 136–145)
Total Bilirubin: 0.5 mg/dL (ref 0.2–1.0)
Total Protein: 6.5 g/dL (ref 6.4–8.2)

## 2024-12-02 LAB — CBC WITH AUTO DIFFERENTIAL
Basophils %: 0.1 % (ref 0.0–2.0)
Basophils Absolute: 0.01 K/UL (ref 0.00–0.10)
Eosinophils %: 0.4 % (ref 0.0–5.0)
Eosinophils Absolute: 0.03 K/UL (ref 0.00–0.40)
Hematocrit: 46.6 % — ABNORMAL HIGH (ref 35.0–45.0)
Hemoglobin: 15.5 g/dL (ref 12.0–16.0)
Immature Granulocytes %: 0.3 % (ref 0.0–0.5)
Immature Granulocytes Absolute: 0.02 K/UL (ref 0.00–0.04)
Lymphocytes %: 21.6 % (ref 21.0–52.0)
Lymphocytes Absolute: 1.54 K/UL (ref 0.90–3.60)
MCH: 31.1 pg (ref 24.0–34.0)
MCHC: 33.3 g/dL (ref 31.0–37.0)
MCV: 93.4 FL (ref 78.0–100.0)
MPV: 11 FL (ref 9.2–11.8)
Monocytes %: 6.3 % (ref 3.0–10.0)
Monocytes Absolute: 0.45 K/UL (ref 0.05–1.20)
Neutrophils %: 71.3 % (ref 40.0–73.0)
Neutrophils Absolute: 5.07 K/UL (ref 1.80–8.00)
Nucleated RBCs: 0 /100{WBCs}
Platelets: 248 K/uL (ref 135–420)
RBC: 4.99 M/uL (ref 4.20–5.30)
RDW: 13.4 % (ref 11.6–14.5)
WBC: 7.1 K/uL (ref 4.6–13.2)
nRBC: 0 K/uL (ref 0.00–0.01)

## 2024-12-02 LAB — URINALYSIS
Bilirubin, Urine: NEGATIVE
Blood, Urine: NEGATIVE
Glucose, Ur: NEGATIVE mg/dL
Ketones, Urine: NEGATIVE mg/dL
Leukocyte Esterase, Urine: NEGATIVE
Nitrite, Urine: NEGATIVE
Protein, UA: NEGATIVE mg/dL
Specific Gravity, UA: 1.017 (ref 1.005–1.030)
Urobilinogen, Urine: 0.2 EU/dL (ref 0.2–1.0)
pH, Urine: 6 (ref 5.0–8.0)

## 2024-12-02 LAB — TROPONIN
Troponin T: 9.9 ng/L (ref 0–14)
Troponin T: 9.3 ng/L (ref 0–14)

## 2024-12-02 LAB — POCT GLUCOSE: POC Glucose: 108 mg/dL (ref 70–110)

## 2024-12-02 LAB — TSH: TSH, 3rd Generation: 1.46 u[IU]/mL (ref 0.27–4.20)

## 2024-12-02 LAB — MAGNESIUM: Magnesium: 1.8 mg/dL (ref 1.6–2.6)

## 2024-12-02 LAB — PROTIME-INR
INR: 1.3 — ABNORMAL HIGH (ref 0.9–1.2)
Protime: 16.4 s — ABNORMAL HIGH (ref 12.0–15.1)

## 2024-12-02 MED ORDER — VERAPAMIL HCL ER 240 MG PO TBCR
240 | Freq: Every day | ORAL | Status: DC
Start: 2024-12-02 — End: 2024-12-07
  Administered 2024-12-03 – 2024-12-06 (×4): 240 mg via ORAL

## 2024-12-02 MED ORDER — POLYVINYL ALCOHOL-POVIDONE PF 1.4-0.6 % OP SOLN
1.4-0.6 | Freq: Two times a day (BID) | OPHTHALMIC | Status: DC
Start: 2024-12-02 — End: 2024-12-07
  Administered 2024-12-03 – 2024-12-06 (×5): 2 [drp] via OPHTHALMIC

## 2024-12-02 MED ORDER — POTASSIUM CHLORIDE CRYS ER 20 MEQ PO TBCR
20 | ORAL | Status: DC | PRN
Start: 2024-12-02 — End: 2024-12-07

## 2024-12-02 MED ORDER — APIXABAN 5 MG PO TABS
5 | Freq: Two times a day (BID) | ORAL | Status: DC
Start: 2024-12-02 — End: 2024-12-07
  Administered 2024-12-04 – 2024-12-07 (×7): 5 mg via ORAL

## 2024-12-02 MED ORDER — IOPAMIDOL 76 % IV SOLN
76 | Freq: Once | INTRAVENOUS | Status: AC | PRN
Start: 2024-12-02 — End: 2024-12-02
  Administered 2024-12-02: 20:00:00 80 mL via INTRAVENOUS

## 2024-12-02 MED ORDER — ACETAMINOPHEN 325 MG PO TABS
325 | ORAL | Status: DC | PRN
Start: 2024-12-02 — End: 2024-12-07

## 2024-12-02 MED ORDER — DONEPEZIL HCL 5 MG PO TABS
5 | Freq: Every day | ORAL | Status: DC
Start: 2024-12-02 — End: 2024-12-07
  Administered 2024-12-02 – 2024-12-06 (×5): 10 mg via ORAL

## 2024-12-02 MED ORDER — POTASSIUM CHLORIDE 10 MEQ/100ML IV SOLN
10 | INTRAVENOUS | Status: DC | PRN
Start: 2024-12-02 — End: 2024-12-07

## 2024-12-02 MED ORDER — LACTATED RINGERS IV SOLN
INTRAVENOUS | Status: AC
Start: 2024-12-02 — End: 2024-12-03
  Administered 2024-12-03: 07:00:00 via INTRAVENOUS

## 2024-12-02 MED ORDER — VITAMIN D 25 MCG (1000 UT) PO TABS
25 | Freq: Every day | ORAL | Status: DC
Start: 2024-12-02 — End: 2024-12-07
  Administered 2024-12-02 – 2024-12-06 (×5): 2000 [IU] via ORAL

## 2024-12-02 MED ORDER — POTASSIUM BICARB-CITRIC ACID 20 MEQ PO TBEF
20 | ORAL | Status: DC | PRN
Start: 2024-12-02 — End: 2024-12-07

## 2024-12-02 MED ORDER — ONDANSETRON 4 MG PO TBDP
4 | Freq: Three times a day (TID) | ORAL | Status: DC | PRN
Start: 2024-12-02 — End: 2024-12-07

## 2024-12-02 MED ORDER — MAGNESIUM SULFATE 2000 MG/50 ML IVPB PREMIX
2 | INTRAVENOUS | Status: DC | PRN
Start: 2024-12-02 — End: 2024-12-07

## 2024-12-02 MED ORDER — MEMANTINE HCL 5 MG PO TABS
5 | Freq: Two times a day (BID) | ORAL | Status: DC
Start: 2024-12-02 — End: 2024-12-07
  Administered 2024-12-03 – 2024-12-07 (×8): 5 mg via ORAL

## 2024-12-02 MED ORDER — ONDANSETRON HCL 4 MG/2ML IJ SOLN
4 | Freq: Four times a day (QID) | INTRAMUSCULAR | Status: DC | PRN
Start: 2024-12-02 — End: 2024-12-07
  Administered 2024-12-03 (×3): 4 mg via INTRAVENOUS

## 2024-12-02 MED ORDER — LABETALOL HCL 5 MG/ML IV SOLN
5 | INTRAVENOUS | Status: DC | PRN
Start: 2024-12-02 — End: 2024-12-07

## 2024-12-02 MED ORDER — ATORVASTATIN CALCIUM 40 MG PO TABS
40 | Freq: Every evening | ORAL | Status: DC
Start: 2024-12-02 — End: 2024-12-07
  Administered 2024-12-04 – 2024-12-07 (×4): 80 mg via ORAL

## 2024-12-02 MED ORDER — ACETAMINOPHEN 650 MG RE SUPP
650 | RECTAL | Status: DC | PRN
Start: 2024-12-02 — End: 2024-12-07

## 2024-12-02 MED ORDER — METOPROLOL TARTRATE 50 MG PO TABS
50 | Freq: Two times a day (BID) | ORAL | Status: DC
Start: 2024-12-02 — End: 2024-12-07
  Administered 2024-12-04 – 2024-12-07 (×7): 100 mg via ORAL

## 2024-12-02 MED ORDER — VITAMIN E 400 UNITS PO CAPS
400 | Freq: Every day | ORAL | Status: DC
Start: 2024-12-02 — End: 2024-12-07
  Administered 2024-12-04: 15:00:00 400 [IU] via ORAL

## 2024-12-02 MED ORDER — CALCIUM CARBONATE 1500 (600 CA) MG PO TABS
1500 | Freq: Every day | ORAL | Status: DC
Start: 2024-12-02 — End: 2024-12-07
  Administered 2024-12-02: 600 via ORAL

## 2024-12-02 MED ORDER — POLYETHYLENE GLYCOL 3350 17 G PO PACK
17 | Freq: Every day | ORAL | Status: DC | PRN
Start: 2024-12-02 — End: 2024-12-07

## 2024-12-02 MED ORDER — NORMAL SALINE FLUSH 0.9 % IV SOLN
0.9 | Freq: Two times a day (BID) | INTRAVENOUS | Status: DC
Start: 2024-12-02 — End: 2024-12-07
  Administered 2024-12-04 – 2024-12-07 (×5): 10 mL via INTRAVENOUS

## 2024-12-02 MED ORDER — SODIUM CHLORIDE 0.9 % IV SOLN
0.9 | INTRAVENOUS | Status: DC | PRN
Start: 2024-12-02 — End: 2024-12-07

## 2024-12-02 MED ORDER — VITAMIN B-12 1000 MCG PO TABS
1000 | Freq: Every day | ORAL | Status: DC
Start: 2024-12-02 — End: 2024-12-07
  Administered 2024-12-02 – 2024-12-06 (×5): 1000 ug via ORAL

## 2024-12-02 MED ORDER — NORMAL SALINE FLUSH 0.9 % IV SOLN
0.9 | INTRAVENOUS | Status: DC | PRN
Start: 2024-12-02 — End: 2024-12-07

## 2024-12-02 MED ORDER — MECLIZINE HCL 12.5 MG PO TABS
12.5 | Freq: Four times a day (QID) | ORAL | Status: DC | PRN
Start: 2024-12-02 — End: 2024-12-07

## 2024-12-02 MED FILL — VERAPAMIL HCL ER 240 MG PO TBCR: 240 mg | ORAL | Qty: 1

## 2024-12-02 MED FILL — CALCIUM CARBONATE 1500 (600 CA) MG PO TABS: 1500 (600 Ca) MG | ORAL | Qty: 1

## 2024-12-02 MED FILL — DONEPEZIL HCL 5 MG PO TABS: 5 mg | ORAL | Qty: 2

## 2024-12-02 MED FILL — LACTATED RINGERS IV SOLN: INTRAVENOUS | Qty: 1000

## 2024-12-02 MED FILL — VITAMIN B-12 1000 MCG PO TABS: 1000 ug | ORAL | Qty: 1

## 2024-12-02 MED FILL — VITAMIN E 400 UNITS PO CAPS: 400 [IU] | ORAL | Qty: 1

## 2024-12-02 MED FILL — BD POSIFLUSH 0.9 % IV SOLN: 0.9 % | INTRAVENOUS | Qty: 40

## 2024-12-02 MED FILL — VITAMIN D (CHOLECALCIFEROL) 25 MCG (1000 UT) PO TABS: 25 MCG (1000 UT) | ORAL | Qty: 2

## 2024-12-02 MED FILL — ISOVUE-370 76 % IV SOLN: 76 % | INTRAVENOUS | Qty: 80

## 2024-12-02 NOTE — ED Provider Notes (Signed)
 "    [facility] EMERGENCY DEPARTMENT  EMERGENCY DEPARTMENT ENCOUNTER    Patient Name: Emily Henson  MRN: [id number]  Birth Date: 04-12-1933  Provider: Sudie Kurk, DO  PCP: Cephas Cletis SAUNDERS, MD   Time/Date of evaluation: 4:13 PM EST on 12/02/24    History of Presenting Illness     History Provided by: Patient, Son, EMS  History is limited by: Dementia    HISTORY:   Emily Henson is a 89 y.o. female with a past medical history of moderate Alzheimer's dementia, hyperglycemia, primary osteoarthritis, permanent atrial fibrillation, gait instability, essential hypertension, and a high risk of falls, who presents with altered mental status. The onset of symptoms was noted by her son around 11:15 AM, when she appeared confused during a phone call. Upon further assessment, she exhibited mild slurred speech and possible right-sided facial droop, though she was able to answer questions appropriately. She denies headache, chest pain, shortness of breath, or urinary symptoms. The patient reports feeling weak and sluggish overall. She has a pacemaker and is on blood thinners, specifically Eliquis , for atrial fibrillation. Her son mentioned that she had weakness in her left upper and lower extremities the previous evening. The history was obtained from the patient and her son.      Nursing Notes were all reviewed and agreed with or any disagreements were addressed in the HPI.    Past History     PAST MEDICAL HISTORY:  Past Medical History:   Diagnosis Date    Arrhythmia     Atrial Fibrillation    Arthritis     Atrial fibrillation (HCC)     CAD (coronary artery disease)     Dementia (HCC)     Hypercholesterolemia     hypertriglycerides    Hypertension        PAST SURGICAL HISTORY:  Past Surgical History:   Procedure Laterality Date    APPENDECTOMY      BREAST REDUCTION SURGERY  1990    CHOLECYSTECTOMY      COLONOSCOPY      GYN      hysterectomy    PACEMAKER      TOTAL COLECTOMY      TOTAL COLECTOMY         FAMILY  HISTORY:  Family History   Problem Relation Age of Onset    Diabetes Mother     Hypertension Mother        SOCIAL HISTORY:  Social History     Tobacco Use    Smoking status: Former    Smokeless tobacco: Never   Substance Use Topics    Alcohol  use: Yes     Comment: occ    Drug use: No       MEDICATIONS:  No current facility-administered medications for this encounter.     Current Outpatient Medications   Medication Sig Dispense Refill    verapamil  (VERELAN ) 240 MG extended release capsule TAKE 1 CAPSULE BY MOUTH ONCE DAILY 90 capsule 2    fenofibrate  (TRICOR ) 145 MG tablet TAKE 1 TABLET BY MOUTH DAILY 90 tablet 1    donepezil  (ARICEPT ) 10 MG tablet Take 1 tablet by mouth daily 90 tablet 0    metoprolol  (LOPRESSOR ) 100 MG tablet Take 1 tablet by mouth 2 times daily      memantine  (NAMENDA ) 5 MG tablet Take 1 tab by mouth nightly x 1 week then 1 tab twice daily. 180 tablet 1    apixaban  (ELIQUIS ) 5 MG TABS tablet Take 1  tablet by mouth 2 times daily 180 tablet 5    vitamin E  1000 units capsule Take 1 capsule by mouth daily      meclizine  (ANTIVERT ) 25 MG tablet Take 1 tablet by mouth every 6 hours as needed for Dizziness 30 tablet 0    polyethyl glycol-propyl glycol 0.4-0.3 % (SYSTANE) 0.4-0.3 % ophthalmic solution Apply 2 drops to eye 2 times daily      calcium  carbonate 1500 (600 Ca) MG TABS tablet Take 1 tablet by mouth daily      Cholecalciferol  50 MCG (2000 UT) TABS Take 1 tablet by mouth daily      cyanocobalamin  1000 MCG tablet Take 1 tablet by mouth daily         ALLERGIES:  Allergies   Allergen Reactions    Caffeine Palpitations    Other Rash     Preservatives       SOCIAL DETERMINANTS OF HEALTH:  Social Drivers of Health     Tobacco Use: Medium Risk (10/21/2024)    Patient History     Smoking Tobacco Use: Former     Smokeless Tobacco Use: Never     Passive Exposure: Not on file   Alcohol  Use: Not At Risk (04/17/2024)    AUDIT-C     Frequency of Alcohol  Consumption: Monthly or less     Average Number of Drinks:  1 or 2     Frequency of Binge Drinking: Never   Financial Resource Strain: Low Risk  (07/19/2023)    Overall Financial Resource Strain (CARDIA)     Difficulty of Paying Living Expenses: Not hard at all   Food Insecurity: No Food Insecurity (10/21/2024)    Hunger Vital Sign     Worried About Running Out of Food in the Last Year: Never true     Ran Out of Food in the Last Year: Never true   Transportation Needs: No Transportation Needs (10/21/2024)    PRAPARE - Therapist, Art (Medical): No     Lack of Transportation (Non-Medical): No   Physical Activity: Inactive (04/17/2024)    Exercise Vital Sign     Days of Exercise per Week: 0 days     Minutes of Exercise per Session: 0 min   Stress: Not on file   Social Connections: Feeling Socially Integrated (02/20/2023)    OASIS D0700: Social Isolation     Frequency of experiencing loneliness or isolation: Rarely   Intimate Partner Violence: Not on file   Depression: Not at risk (10/21/2024)    PHQ-2     PHQ-2 Score: 0   Housing Stability: Low Risk  (10/21/2024)    Housing Stability Vital Sign     Unable to Pay for Housing in the Last Year: No     Number of Times Moved in the Last Year: 0     Homeless in the Last Year: No   Interpersonal Safety: Not on file   Utilities: Not At Risk (10/21/2024)    [employer] Utilities     Threatened with loss of utilities: No       Review of Systems   General: Reports feeling weak and sluggish; denies fever, chills, or weight loss.  ENT: Denies sore throat or runny nose.  Cardiac: Denies chest pain.  Pulmonary: Denies shortness of breath or cough.  Neurological: Reports altered mental status and slurred speech; denies headache.      Negative except as listed above in HPI.    Physical Exam  Vitals:    12/02/24 1358 12/02/24 1400 12/02/24 1401 12/02/24 1415   BP:  (!) 161/87 (!) 161/87 (!) 178/85   Pulse: 75 76 73 70   Resp:  14 16 14    SpO2:  98% 98% 97%   Weight:   79.8 kg (176 lb)    Height:   1.651 m (5' 5)         Constitutional: 89 year old African-American female well nourished, well developed, appears stated age. Alert and oriented.  HEENT: Normal cephalic/atraumatic.  neck supple without meningismus. PERRLA, no conjunctival injection. EOM intact, sclera anicteric.  Pharynx clear without exudates.  Cardiovascular: RRR, S1/S2, no murmurs, rubs, or gallops.  Warm, well-perfused extremities  Respiratory: Clear to auscultation bilaterally, no wheezing, rales, or rhonchi.  Unlabored respiratory effort  Abdominal: Soft, nontender, nondistended, no rebound, no guarding, no rigidity.  No CVA tenderness  Musculoskeletal: Full range of motion all extremities. No deformities. No peripheral edema.  Skin: Warm, dry. No rashes  Vascular: Pulses equal in all 4 extremities.  Neuro: CNs II-XII grossly intact. Sensation and motor function intact.  No focal neurological deficits.  Psych: Appropriate mood and affect.    Diagnostic Study Results     LABS:  Recent Results (from the past 12 hours)   EKG 12 Lead (Abn HR)    Collection Time: 12/02/24  1:33 PM   Result Value Ref Range    Ventricular Rate 73 BPM    Atrial Rate 98 BPM    QRS Duration 102 ms    Q-T Interval 376 ms    QTc Calculation (Bazett) 414 ms    R Axis -6 degrees    T Axis -55 degrees    Diagnosis       Atrial fibrillation  Nonspecific ST and T wave abnormality  Abnormal ECG  No previous ECGs available     TSH    Collection Time: 12/02/24  1:39 PM   Result Value Ref Range    TSH, 3rd Generation 1.460 0.27 - 4.20 uIU/mL   Troponin    Collection Time: 12/02/24  1:39 PM   Result Value Ref Range    Troponin T 9.9 0 - 14 ng/L   Magnesium     Collection Time: 12/02/24  1:39 PM   Result Value Ref Range    Magnesium  1.8 1.6 - 2.6 mg/dL   CMP    Collection Time: 12/02/24  1:39 PM   Result Value Ref Range    Sodium 141 136 - 145 mmol/L    Potassium 3.9 3.5 - 5.5 mmol/L    Chloride 103 98 - 107 mmol/L    CO2 28 21 - 32 mmol/L    Anion Gap 10 3.0 - 18.0 mmol/L    Glucose 128 (H) 74 -  108 mg/dL    BUN 12 6 - 23 MG/DL    Creatinine 9.32 0.6 - 1.3 MG/DL    BUN/Creatinine Ratio 18 12 - 20      Est, Glom Filt Rate 83 >60 ml/min/1.23m2    Calcium  11.4 (H) 8.6 - 10.2 MG/DL    Total Bilirubin 0.5 0.2 - 1.0 MG/DL    ALT 13 10 - 35 U/L    AST 16 10 - 38 U/L    Alk Phosphatase 48 45 - 117 U/L    Total Protein 6.5 6.4 - 8.2 g/dL    Albumin 3.4 3.4 - 5.0 g/dL    Globulin 3.1 2.0 - 4.0 g/dL    Albumin/Globulin Ratio 1.1 0.8 -  1.7     CBC with Auto Differential    Collection Time: 12/02/24  1:39 PM   Result Value Ref Range    WBC 7.1 4.6 - 13.2 K/uL    RBC 4.99 4.20 - 5.30 M/uL    Hemoglobin 15.5 12.0 - 16.0 g/dL    Hematocrit 53.3 (H) 35.0 - 45.0 %    MCV 93.4 78.0 - 100.0 FL    MCH 31.1 24.0 - 34.0 PG    MCHC 33.3 31.0 - 37.0 g/dL    RDW 86.5 88.3 - 85.4 %    Platelets 248 135 - 420 K/uL    MPV 11.0 9.2 - 11.8 FL    Nucleated RBCs 0.0 0 PER 100 WBC    nRBC 0.00 0.00 - 0.01 K/uL    Neutrophils % 71.3 40.0 - 73.0 %    Lymphocytes % 21.6 21.0 - 52.0 %    Monocytes % 6.3 3.0 - 10.0 %    Eosinophils % 0.4 0.0 - 5.0 %    Basophils % 0.1 0.0 - 2.0 %    Immature Granulocytes % 0.3 0.0 - 0.5 %    Neutrophils Absolute 5.07 1.80 - 8.00 K/UL    Lymphocytes Absolute 1.54 0.90 - 3.60 K/UL    Monocytes Absolute 0.45 0.05 - 1.20 K/UL    Eosinophils Absolute 0.03 0.00 - 0.40 K/UL    Basophils Absolute 0.01 0.00 - 0.10 K/UL    Immature Granulocytes Absolute 0.02 0.00 - 0.04 K/UL    Differential Type AUTOMATED     Protime-INR    Collection Time: 12/02/24  1:39 PM   Result Value Ref Range    Protime 16.4 (H) 12.0 - 15.1 sec    INR 1.3 (H) 0.9 - 1.2     Respiratory Panel, Molecular, with COVID-19 (Restricted: peds pts or suitable admitted adults)    Collection Time: 12/02/24  1:40 PM    Specimen: Nasopharynx   Result Value Ref Range    Adenovirus by PCR Not detected NOTD      Coronavirus 229E by PCR Not detected NOTD      Coronavirus HKU1 by PCR Not detected NOTD      Coronavirus NL63 by PCR Not detected NOTD      Coronavirus  OC43 by PCR Not detected NOTD      SARS-CoV-2, PCR Not detected NOTD      Human Metapneumovirus by PCR Not detected NOTD      Rhinovirus Enterovirus PCR Not detected NOTD      Influenza A by PCR Not detected NOTD      Influenza B PCR Not detected NOTD      Parainfluenza 1 PCR Not detected NOTD      Parainfluenza 2 PCR Not detected NOTD      Parainfluenza 3 PCR Not detected NOTD      Parainfluenza 4 PCR Not detected NOTD      Respiratory Syncytial Virus by PCR Not detected NOTD      Bordetella parapertussis by PCR Not detected NOTD      Bordetella pertussis by PCR Not detected NOTD      Chlamydophila Pneumonia PCR Not detected NOTD      Mycoplasma pneumo by PCR Not detected NOTD     Urinalysis    Collection Time: 12/02/24  1:52 PM   Result Value Ref Range    Color, UA YELLOW      Appearance CLEAR      Specific Gravity, UA 1.017 1.005 - 1.030  pH, Urine 6.0 5.0 - 8.0      Protein, UA Negative NEG mg/dL    Glucose, Ur Negative NEG mg/dL    Ketones, Urine Negative NEG mg/dL    Bilirubin, Urine Negative NEG      Blood, Urine Negative NEG      Urobilinogen, Urine 0.2 0.2 - 1.0 EU/dL    Nitrite, Urine Negative NEG      Leukocyte Esterase, Urine Negative NEG     POCT Glucose    Collection Time: 12/02/24  2:23 PM   Result Value Ref Range    POC Glucose 108 70 - 110 mg/dL   POCT Glucose    Collection Time: 12/02/24  2:56 PM   Result Value Ref Range    QC OK? y    Troponin    Collection Time: 12/02/24  3:13 PM   Result Value Ref Range    Troponin T 9.3 0 - 14 ng/L       RADIOLOGIC STUDIES:   Non x-ray images such as CT, Ultrasound and MRI are read by the radiologist. X-ray images are visualized and preliminarily interpreted by the ED Provider with the findings as listed in the ED Course section below.     Interpretation per the Radiologist is listed below, if available at the time of this note:    CTA HEAD NECK W WO CONTRAST   Final Result   Severe stenosis in the P1 segment of the left posterior cerebral   artery. There  are also mild intracranial vascular stenoses as described above.   No evidence of large vessel occlusion.      Electronically signed by Charlie JONELLE Brigham      CT Head W/O Contrast   Final Result   No intracranial mass lesion or hemorrhage is demonstrated. Findings   suggestive of moderate microvascular ischemic changes.         Electronically signed by Charlie JONELLE Brigham      XR CHEST PORTABLE    (Results Pending)       Procedures     Critical Care    Performed by: Orval Baxter, DO  Authorized by: Orval Baxter, DO    Critical care provider statement:     Critical care time (minutes):  40    Critical care time was exclusive of:  Separately billable procedures and treating other patients and teaching time    Critical care was necessary to treat or prevent imminent or life-threatening deterioration of the following conditions:  CNS failure or compromise, dehydration and metabolic crisis    Critical care was time spent personally by me on the following activities:  Blood draw for specimens, development of treatment plan with patient or surrogate, discussions with consultants, evaluation of patient's response to treatment, examination of patient, interpretation of cardiac output measurements, obtaining history from patient or surrogate, ordering and performing treatments and interventions, ordering and review of laboratory studies, ordering and review of radiographic studies, pulse oximetry, re-evaluation of patient's condition and review of old charts    I assumed direction of critical care for this patient from another provider in my specialty: no        ED Course and Medical Decision Making     4:13 PM EST I Cozette KANDICE Orval, DO) am the first provider for this patient. Initial assessment performed. I reviewed the vital signs, available nursing notes, past medical history, past surgical history, family history and social history. The patients presenting problems have been discussed, and they are  in agreement with  the care plan formulated and outlined with them. I have encouraged them to ask questions as they arise throughout their visit.     My differential diagnosis included but was not limited to TIA, anemia, generalized weakness, CVA      Provider Notes (Medical Decision Making):     MDM  Initial Evaluation:  - Patient presents with altered mental status, slurred speech, and possible right-sided facial droop.  Plan:  - Obtain teleneurology consultation  - Perform CT and CTA of head and neck  Re-evaluation 1: \[2024-12-02 04:01 PM EST\]  - EKG shows atrial fibrillation at 73 bpm with normal axis and nonspecific ST-T wave changes  - CT head without contrast is unremarkable  - CTA shows severe stenosis in P1 segment of left posterior cerebral artery and mild intracranial vascular stenoses  - Teleneurology recommends admission, further imaging, and continuation of Eliquis   - Reassessment by nursing noted patient was unsteady  - Son reports patient's weakness in left upper and lower extremities previous day  Re-evaluation 2: \[2024-12-02 04:03 PM EST\]  - Troponin T is 9.9  - Calcium  is 11.4  - Prothrombin time is 16.4, INR is 1.3        ED Course as of 12/02/24 1613   Tue Dec 02, 2024   1345 EKG performed at 1:33 PM  Rhythm: Atrial fibrillation. Rate (approx.): 73; Axis: normal; ; QRS interval: normal ; ST/T wave: Nonspecific ST/T wave changes; i This EKG was interpreted by Sudie Kurk, DO,ED Provider.   [CC]      ED Course User Index  [CC] Kurk Sudie, DO     ED Course: The patient, an 89 year old female with a history of moderate Alzheimer's dementia, atrial fibrillation, and other chronic conditions, was brought to the ED by EMS with altered mental status. The patient's son reported potential confusion and slurred speech, but no significant facial droop was noted. The patient denied pain, headache, or chest pain, and reported feeling weak and sluggish. Initial examination showed no focal neurological deficits, but  a slight droop on the right side was observed. The patient was on Eliquis  for atrial fibrillation. EKG revealed atrial fibrillation at 73 bpm with nonspecific ST-T changes. A CT scan of the head was unremarkable, and CTA of the head and neck showed no large vessel occlusion. However, severe stenosis in the P1 segment of the left posterior cerebral artery was noted. A teleneurology consultation recommended admission for further evaluation, including MRI of the brain and MRA of the neck. The patient was admitted to the telemetry unit for further management.    Differential Diagnosis: Differential diagnosis includes but is not limited to: transient ischemic attack (TIA), cerebrovascular accident (CVA), metabolic encephalopathy, and medication side effects.    Diagnostics Review    EKG Interpretation (Interpreted by me): EKG performed at 01:33 PM shows atrial fibrillation at 73 bpm, normal axis, nonspecific ST-T changes.    Laboratory Interpretation (Interpreted by me): Troponin T is 9.9. Glucose is 128. Calcium  is elevated at 11.4. Prothrombin time is 16.4 with INR of 1.3. Other labs including CBC, renal function, and liver function tests are within normal limits.    Imaging Interpretation (Interpreted by me): CT head is unremarkable. CTA of the head and neck shows severe stenosis in the P1 segment of the left posterior cerebral artery with mild intracranial vascular stenoses, no large vessel occlusion.    Specialist Consultations Obtained or Considered: Teleneurology consultation obtained.    Historians other than the  patient: Son provided additional history regarding the onset of symptoms and patient's baseline mental status.    External Records Reviewed: None.    Care significantly affected by the following chronic Conditions: The patient's history of atrial fibrillation and Alzheimer's dementia complicates the clinical picture and management, particularly in assessing the risk of stroke and ensuring compliance with  treatment.    Management    Medications: None administered in the ED.    Re-Evaluations/Course of Care: Reassessment after ambulation showed unsteadiness. NIH Stroke Scale was initially zero but later noted as one by nursing staff. Continued monitoring and evaluation were recommended.    Potential for Clinical Deterioration: Yes, given the patient's age, history of atrial fibrillation, and findings of vascular stenosis, there is a risk for stroke or further neurological deterioration.    Clinical Impression & Plan    Primary Diagnosis: Altered mental status, likely secondary to cerebrovascular insufficiency.    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 Recommendations include MRI of the brain, MRA of the neck, permissive hypertension, hydration, and continuation of Eliquis . Additional evaluations such as OT/PT and stroke education are planned.      Patient was given the following medications:  Medications   iopamidol  (ISOVUE -370) 76 % injection 80 mL (80 mLs IntraVENous Given 12/02/24 1443)       CONSULTS: (Who and What was discussed)  IP CONSULT TO TELE-NEUROLOGY    Chronic Conditions: See HPI and PMHx above    Social Determinants affecting Dx or Tx: N/A    Records Reviewed (source and summary of external notes): Nursing Notes, Old Medical Records, Previous electrocardiograms, Ambulance Run Sheet, Previous Radiology Studies, and Previous Laboratory Studies    @procdoc @    Critical Care Time: 40    SCREENING TOOLS:  NIH Stroke Scale  NIH Stroke Scale Assessed: Yes  Interval: Reassessment  Level of Consciousness (1a): Alert  LOC Questions (1b): Answers both correctly  LOC Commands (1c): Performs both tasks correctly  Best Gaze (2): Normal  Visual (3): No visual loss (legally blind)  Facial Palsy  (4): Normal symmetrical movement  Motor Arm, Left (5a): No drift  Motor Arm, Right (5b): No drift  Motor Leg, Left (6a): No drift  Motor Leg, Right (6b): No drift  Limb Ataxia (7): Absent  Sensory (8): Normal  Best Language (9): No aphasia  Dysarthria (10): Mild to moderate, slurs some words  Extinction and Inattention (11): No abnormality  Total: 1    CLINICAL MANAGEMENT TOOLS:  Not Applicable    Positive and negative test results were discussed. My clinical assessment and recommendations were discussed. They agree with the plan of admission. Return precautions were discussed. All questions were answered and they are in agreement with plan.      Is this patient to be included in the SEP-1 core measure? No Exclusion criteria - the patient is NOT to be included for SEP-1 Core Measure due to: Infection is not suspected    MEDICATIONS ADMINISTERED IN THE ED:  Medications   iopamidol  (ISOVUE -370) 76 % injection 80 mL (80 mLs IntraVENous Given 12/02/24 1443)       ED Course as of 12/02/24 1613   Tue Dec 02, 2024   1345 EKG performed at 1:33 PM  Rhythm: Atrial fibrillation.  Rate (approx.): 73; Axis: normal; ; QRS interval: normal ; ST/T wave: Nonspecific ST/T wave changes; i This EKG was interpreted by Sudie Kurk, DO,ED Provider.   [CC]      ED Course User Index  [CC] Kurk Sudie, DO       IP CONSULT TO TELE-NEUROLOGY        Diagnosis and Disposition   Diagnosis:   1. Cerebrovascular accident (CVA), unspecified mechanism (HCC)    2. Hypercalcemia    3. Chronic atrial fibrillation [facility])          Disposition:    DISPOSITION Decision To Admit 12/02/2024 04:10:25 PM         @[email or web address] @      Dragon Disclaimer     Please note that this dictation was completed with [employer], the computer voice recognition software. Quite often unanticipated grammatical, syntax, homophones, and other interpretive errors are inadvertently transcribed by the computer software. Please disregard these errors. Please excuse any errors that  have escaped final proofreading.      LILLETTE Sudie MORTON Kurk, DO am the primary clinician of record.  Sudie MORTON Kurk, DO  (Electronically signed)            Kurk Sudie, DO  12/02/24 1618    "

## 2024-12-02 NOTE — ED Triage Notes (Signed)
"  Pt arrives via EMS from home after son was on the phone with the patient this morning at 11:15 and noticed slurred speech.   On EMS arrival, slurred speech had resolved, EMS only noted minor droop to the right side of her mouth, no other symptoms noted by EMS   EMS BG 131   Son reports pt has intermittent slurring   Denies hx of CVA, DM, HTN   +BT   Pt A&O, pt has hx of dementia   Pt is legally blind   "

## 2024-12-02 NOTE — H&P (Signed)
 "  Hospitalist Admission History and Physical    NAME:  Emily Henson     DOB:   06-01-1933     MRN:   [id number]     PCP:  Cephas Cletis SAUNDERS, MD    Date/Time:  12/02/2024 6:03 PM    DOA: 12/02/2024    Chief Complaint:   Chief Complaint   Patient presents with    Extremity Weakness       SUBJECTIVE:     HISTORY OF PRESENT ILLNESS:     Emily Henson is a 89 y.o.  African American female who presented to the ED via EMS transport due to slurred speech and right-sided facial droop.  Patient's family called 911 after noticing symptoms this morning at 11:15.  On approach, patient resting supine in bed comfortably, accompanied by her daughter at bedside.  Patient's daughter lives with the patient full-time and oversees her medications/act as primary caregiver for the past 2 years.  States the patient takes her home medications regularly, including home anticoagulation Eliquis  after pacemaker placement for permanent A-fib.  Denies any recent changes in patient's medical history or sudden decline in mental status.  Daughter states that patient is confused at baseline due to history of dementia.  During our encounter, patient's speech was mildly slurred but distinguishable.  She was only oriented to self.  Unaware she was in the hospital, stated that she thought she was at home.  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  She denies any symptoms or discomfort.  No headache, F/C/N/V/D, dizziness, abdominal, chest, or back pain.  Per ED documentation, patient was unsteady on her feet and exhibited a shuffling gait with leaning to the right side when asked to ambulate.  Teleneuro was consulted in ED.       Past Medical History:   Diagnosis Date    Arthritis     CAD (coronary artery disease)     Dementia (HCC)     History of abnormal Holter exam 04/07/2024    14 day monitor 04/07/24 100% AF average HR 75 bpm, no symptoms    Hypercholesterolemia     hypertriglycerides    Hypertension     Permanent atrial fibrillation (HCC)     Unclear onset.  VVI pacing since 2013. Rates now controlled. 100% AF per 14d monitor 04/07/24.    Sick sinus syndrome [facility])        Past Surgical History:   Procedure Laterality Date    APPENDECTOMY      BREAST REDUCTION SURGERY  1990    CARDIAC PACEMAKER PLACEMENT  09/25/2012    S/P single chamber pacemaker placement    CHOLECYSTECTOMY      COLONOSCOPY      HYSTERECTOMY (CERVIX STATUS UNKNOWN)      TOTAL COLECTOMY         Family History   Problem Relation Age of Onset    Diabetes Mother     Hypertension Mother        Social History     Socioeconomic History    Marital status: Widowed     Spouse name: None    Number of children: None    Years of education: None    Highest education level: None   Tobacco Use    Smoking status: Former    Smokeless tobacco: Never   Substance and Sexual Activity    Alcohol  use: Yes     Comment: occ    Drug use: No    Sexual activity: Not Currently  Social Drivers of Psychologist, [employer] Strain: Low Risk  (07/19/2023)    Overall Financial Resource Strain (CARDIA)     Difficulty of Paying Living Expenses: Not hard at all   Food Insecurity: No Food Insecurity (10/21/2024)    Hunger Vital Sign     Worried About Running Out of Food in the Last Year: Never true     Ran Out of Food in the Last Year: Never true   Transportation Needs: No Transportation Needs (10/21/2024)    PRAPARE - Therapist, Art (Medical): No     Lack of Transportation (Non-Medical): No   Physical Activity: Inactive (04/17/2024)    Exercise Vital Sign     Days of Exercise per Week: 0 days     Minutes of Exercise per Session: 0 min   Social Connections: Feeling Socially Integrated (02/20/2023)    OASIS D0700: Social Isolation     Frequency of experiencing loneliness or isolation: Rarely   Housing Stability: Low Risk  (10/21/2024)    Housing Stability Vital Sign     Unable to Pay for Housing in the Last Year: No     Number of Times Moved in the Last Year: 0     Homeless in the Last Year: No       Allergies    Allergen Reactions    Caffeine Palpitations        Prior to Admission Medications   Prescriptions Last Dose Informant Patient Reported? Taking?   Cholecalciferol  50 MCG (2000 UT) TABS 12/02/2024  Yes Yes   Sig: Take 1 tablet by mouth daily   apixaban  (ELIQUIS ) 5 MG TABS tablet 12/02/2024  No Yes   Sig: Take 1 tablet by mouth 2 times daily   calcium  carbonate 1500 (600 Ca) MG TABS tablet 12/02/2024  Yes Yes   Sig: Take 1 tablet by mouth daily   cyanocobalamin  1000 MCG tablet 12/02/2024  Yes Yes   Sig: Take 1 tablet by mouth daily   donepezil  (ARICEPT ) 10 MG tablet 12/02/2024  No Yes   Sig: Take 1 tablet by mouth daily   fenofibrate  (TRICOR ) 145 MG tablet 12/02/2024  No Yes   Sig: TAKE 1 TABLET BY MOUTH DAILY   meclizine  (ANTIVERT ) 25 MG tablet   No Yes   Sig: Take 1 tablet by mouth every 6 hours as needed for Dizziness   memantine  (NAMENDA ) 5 MG tablet 12/02/2024  No Yes   Sig: Take 1 tab by mouth nightly x 1 week then 1 tab twice daily.   metoprolol  (LOPRESSOR ) 100 MG tablet 12/02/2024  Yes Yes   Sig: Take 1 tablet by mouth 2 times daily   polyethyl glycol-propyl glycol 0.4-0.3 % (SYSTANE) 0.4-0.3 % ophthalmic solution   Yes Yes   Sig: Apply 2 drops to eye 2 times daily   verapamil  (VERELAN ) 240 MG extended release capsule 12/02/2024  No Yes   Sig: TAKE 1 CAPSULE BY MOUTH ONCE DAILY   vitamin E  1000 units capsule 12/02/2024  Yes Yes   Sig: Take 1 capsule by mouth daily      Facility-Administered Medications: None          OBJECTIVE:     BP (!) 178/85   Pulse 70   Temp 98.2 F (36.8 C) (Oral)   Resp 14   Ht 1.651 m (5' 5)   Wt 79.8 kg (176 lb)   SpO2 97%   BMI 29.29 kg/m  Temp (24hrs), Avg:98.2 F (36.8 C), Min:98.2 F (36.8 C), Max:98.2 F (36.8 C)    No intake/output data recorded.        PHYSICAL EXAM:   General:  Alert, cooperative, no acute distress    HEENT: Moist mucous membranes, blind in both eyes.  Pulmonary:  CTAB. Symmetric chest expansion. No accessory muscle use.   Cardiovascular:  irregularly irregular   GI:  Soft, NTND, NABS  Extremities:  No edema, cyanosis, or clubbing.  Psych: Limited insight. Not anxious or agitated.  Neurologic: Oriented to self only. CN grossly intact. Strength of UE and LE intact but asymmetric, L>R.      LAB DATA REVIEWED:    Lab Results   Component Value Date    WBC 7.1 12/02/2024    HGB 15.5 12/02/2024    HCT 46.6 (H) 12/02/2024    MCV 93.4 12/02/2024    PLT 248 12/02/2024    LYMPHOPCT 21.6 12/02/2024    RBC 4.99 12/02/2024    MCH 31.1 12/02/2024    MCHC 33.3 12/02/2024    RDW 13.4 12/02/2024       Lab Results   Component Value Date    NA 141 12/02/2024    K 3.9 12/02/2024    CL 103 12/02/2024    CO2 28 12/02/2024    BUN 12 12/02/2024    CREATININE 0.67 12/02/2024    GLUCOSE 128 (H) 12/02/2024    CALCIUM  11.4 (H) 12/02/2024    BILITOT 0.5 12/02/2024    ALKPHOS 48 12/02/2024    AST 16 12/02/2024    ALT 13 12/02/2024    LABGLOM 83 12/02/2024    GLOB 3.1 12/02/2024       Lab Results   Component Value Date/Time    APPEARANCE CLEAR 12/02/2024 01:52 PM    COLORU YELLOW 12/02/2024 01:52 PM    NITRU Negative 12/02/2024 01:52 PM    GLUCOSEU Negative 12/02/2024 01:52 PM    KETUA Negative 12/02/2024 01:52 PM    UROBILINOGEN 0.2 12/02/2024 01:52 PM    BILIRUBINUR Negative 12/02/2024 01:52 PM         IMAGING RESULTS:  CTA HEAD NECK W WO CONTRAST  Result Date: 12/02/2024  Severe stenosis in the P1 segment of the left posterior cerebral artery. There are also mild intracranial vascular stenoses as described above. No evidence of large vessel occlusion. Electronically signed by Charlie JONELLE Brigham    CT Head W/O Contrast  Result Date: 12/02/2024  No intracranial mass lesion or hemorrhage is demonstrated. Findings suggestive of moderate microvascular ischemic changes. Electronically signed by Charlie JONELLE Brigham        ASSESSMENT & PLAN:      #Slurred speech  #CVA vs. TIA  - Telemetry + neuro checks q4h  - Diet NPO until bedside swallow evaluation   If failed, swallow study with ST   If  passed, advance diet as tolerated  - Atorvastatin  + home med Eliquis   - 24h permissive HTN (no higher than 220/110 mmHg)  PRN IV Labetalol  if parameters met  - IV fluids w/ LR at rate of 100 cc/h  - CTA head/neck: severe stenosis of L PCA. No LVO.  - CT head: moderate microvascular ischemic changes  - MRI brain pending  - TSH WNL  - Echo, lipid panel, HgA1c tomorrow AM  - Maintain HOB >30 degrees for aspiration precautions  - Pending consults: neuro, PT, OT, ST, cardio    #Persistent Afib RVR, on Eliquis   #S/P single chamber pacemaker placement  09/25/12  - Concern of cardioembolic stroke, echo ordered  - Cardio consult pending, recommendations appreciated  - Echo 04/04/24 - EF 53%, visually mod enlarged LA  - 14d holter 04/04/24 - 100% AF, average HR 75 bpm, no symptoms    #HTN  - Home med Lopressor   Hold in setting of stroke R/O for permissive HTN    #Alzheimer's dementia  - Home meds Donepezil  + Memantine   - Qtc 414 ms, ok to continue home meds    #HLD  #CAD  - Home med Fenofibrate   - High dose statin per stroke tx protocol    __________________________________________________    [x]  Reviewed outside notes    [x]  Reviewed labs and imaging    Diet: Diet NPO    DVT Prophylaxis:  [] Lovenox  [] Coumadin  [] Hep SQ  [] SCDs  [] Xarelto  [x] Eliquis     Disposition:  [x] Home w/ Family  [] HH PT,OT,RN  [] SNF/LTC  [] SAH/Rehab    Discussed Code Status:  [] Full Code  [x] DNR       Care Plan discussed with:  [x] Patient  [x] Family  [] Case management    __________________________________________________    Admitting Provider:  Maurilio Sharps, PA  [facility]    Disclaimer: Sections of this note are dictated using utilizing voice recognition software. Minor typographical errors may be present. If questions arise, please do not hesitate to contact me or call our department.      "

## 2024-12-02 NOTE — Progress Notes (Signed)
"  Patients pacemaker needs to be identified to see if it is MRI conditional. MRI conditional pacemakers are done Mon-Fri during normal operating hours.   "

## 2024-12-02 NOTE — ED Notes (Signed)
"  Emily Henson, production designer, theatre/television/film and this RN at bedside to verify MRI pacemaker as patient and daughter previously noted that patient does not have any implantable devices. Patient and daughter endorsed that patient does have a pacemaker.     Pacemaker is [employer] pacemaker. Patient and daughter are unsure if the pacemaker is MRI compatible.    Verified with this RN, Careers information officer, patient, and daughter that patient does not have any additional implanted devices.  "

## 2024-12-02 NOTE — ED Notes (Signed)
 "ED TO INPATIENT SBAR HANDOFF    Patient Name: Emily Henson   Preferred Name: [name]  DOB: 1933-02-12  89 y.o.   Family/Caregiver Present: no   Code Status Order: DNR  PO Status: NPO:No  Telemetry Order: Yes  C-SSRS: Risk of Suicide: No Risk  Sitter no   Restraints:     Sepsis Risk Score Sepsis V2 Risk Score: 8.9    Situation  Chief Complaint   Patient presents with    Extremity Weakness     Brief Description of Patient's Condition: Stroke alert  Mental Status: oriented, alert, coherent, logical, thought processes intact, and able to concentrate and follow conversation  Arrived from:Home  Imaging:   CTA HEAD NECK W WO CONTRAST   Final Result   Severe stenosis in the P1 segment of the left posterior cerebral   artery. There are also mild intracranial vascular stenoses as described above.   No evidence of large vessel occlusion.      Electronically signed by Charlie JONELLE Brigham      CT Head W/O Contrast   Final Result   No intracranial mass lesion or hemorrhage is demonstrated. Findings   suggestive of moderate microvascular ischemic changes.         Electronically signed by Charlie JONELLE Brigham      XR CHEST PORTABLE    (Results Pending)   MRI brain without contrast    (Results Pending)     Abnormal labs:   Abnormal Labs Reviewed   COMPREHENSIVE METABOLIC PANEL - Abnormal; Notable for the following components:       Result Value    Glucose 128 (*)     Calcium  11.4 (*)     All other components within normal limits   CBC WITH AUTO DIFFERENTIAL - Abnormal; Notable for the following components:    Hematocrit 46.6 (*)     All other components within normal limits   PROTIME-INR - Abnormal; Notable for the following components:    Protime 16.4 (*)     INR 1.3 (*)     All other components within normal limits       Background  Allergies:   Allergies   Allergen Reactions    Caffeine Palpitations     History:   Past Medical History:   Diagnosis Date    Arthritis     CAD (coronary artery disease)     Dementia (HCC)     Hearing loss      History of abnormal Holter exam 04/07/2024    14 day monitor 04/07/24 100% AF average HR 75 bpm, no symptoms    Hypercholesterolemia     hypertriglycerides    Hypertension     Permanent atrial fibrillation (HCC)     Unclear onset. VVI pacing since 2013. Rates now controlled. 100% AF per 14d monitor 04/07/24.    Sick sinus syndrome (HCC)        Assessment  Vitals:    Level of Consciousness: Alert (0)   Vitals:    12/02/24 1600 12/02/24 1700 12/02/24 1708 12/02/24 1800   BP: (!) 172/80 135/83  (!) 145/83   Pulse: 77 70  85   Resp: 18 21  19    Temp:   98.2 F (36.8 C)    TempSrc:   Oral    SpO2: 96% 97%  97%   Weight:       Height:         Deterioration Index (DI): Deterioration Index: 24.65  Deterioration Index (DI) Interventions  Performed:    O2 Flow Rate: O2 Flow Rate (L/min): 0 L/min  O2 Device: O2 Device: None (Room air)  Cardiac Rhythm:    Critical Lab Results: @LABCR @  Cultures: Cultures:Urine, Flu, and Covid  NIH Score: NIH NIH Stroke Scale  NIH Stroke Scale Assessed: Yes  Interval: Reassessment  Level of Consciousness (1a): Alert  LOC Questions (1b): Answers both correctly  LOC Commands (1c): Performs both tasks correctly  Best Gaze (2): Normal  Visual (3): No visual loss  Facial Palsy (4): Normal symmetrical movement  Motor Arm, Left (5a): No drift  Motor Arm, Right (5b): No drift  Motor Leg, Left (6a): No drift  Motor Leg, Right (6b): No drift  Limb Ataxia (7): Absent  Sensory (8): Normal  Best Language (9): No aphasia  Dysarthria (10): Normal  Extinction and Inattention (11): No abnormality  Total: 0   Active LDA's:   Peripheral IV 12/02/24 Right Antecubital (Active)     Active Central Lines:                          Active Wounds:    Active Foley's:    Active Feeding Tubes:      Administered Medications:   Medications   sodium chloride  flush 0.9 % injection 5-40 mL (has no administration in time range)   sodium chloride  flush 0.9 % injection 5-40 mL (has no administration in time range)   0.9 % sodium  chloride infusion (has no administration in time range)   potassium chloride  (KLOR-CON  M) extended release tablet 40 mEq (has no administration in time range)     Or   potassium bicarb-citric acid  (EFFER-K) effervescent tablet 40 mEq (has no administration in time range)     Or   potassium chloride  10 mEq/100 mL IVPB (Peripheral Line) (has no administration in time range)   magnesium  sulfate 2000 mg in 50 mL IVPB premix (has no administration in time range)   acetaminophen  (TYLENOL ) tablet 650 mg (has no administration in time range)     Or   acetaminophen  (TYLENOL ) suppository 650 mg (has no administration in time range)   ondansetron  (ZOFRAN -ODT) disintegrating tablet 4 mg (has no administration in time range)     Or   ondansetron  (ZOFRAN ) injection 4 mg (has no administration in time range)   polyethylene glycol (GLYCOLAX ) packet 17 g (has no administration in time range)   atorvastatin  (LIPITOR ) tablet 80 mg (has no administration in time range)   labetalol  (NORMODYNE ;TRANDATE ) injection 10 mg (has no administration in time range)   lactated ringers  infusion (has no administration in time range)   donepezil  (ARICEPT ) tablet 10 mg (has no administration in time range)   metoprolol  tartrate (LOPRESSOR ) tablet 100 mg ( Oral Automatically Held 12/05/24 2100)   verapamil  (CALAN  SR) extended release tablet 240 mg (has no administration in time range)   memantine  (NAMENDA ) tablet 5 mg (has no administration in time range)   apixaban  (ELIQUIS ) tablet 5 mg (has no administration in time range)   calcium  carbonate tablet 600 mg (has no administration in time range)   Vitamin D  (CHOLECALCIFEROL ) tablet 2,000 Units (has no administration in time range)   vitamin B-12 (CYANOCOBALAMIN ) tablet 1,000 mcg (has no administration in time range)   Polyvinyl Alcohol -Povidone PF (REFRESH) 1.4-0.6 % ophthalmic solution 2 drop (has no administration in time range)   meclizine  (ANTIVERT ) tablet 25 mg (has no administration in time  range)   vitamin E  capsule 400  Units (has no administration in time range)   iopamidol  (ISOVUE -370) 76 % injection 80 mL (80 mLs IntraVENous Given 12/02/24 1443)     Last documented pain medication administration: None  Pertinent or High Risk Medications/Drips: no   If Yes, please provide details: none  Blood Product Administration: no  If Yes, please provide details: None  Process Protocols/Bundles: Stroke Protocol/Bundle Completion-completed    Recommendation  Incomplete STAT orders: none  Overdue Medications: none  Patient Belongings:    Additional Comments: none  If any further questions, please call Sending RN at [phone]      Admitting Unit Notification  Name of person notified and time: Lauraine, RN at [employer]      Electronically signed by: Electronically signed by Jacquline Kingsley, RN on 12/02/2024 at 6:37 PM      "

## 2024-12-02 NOTE — ED Notes (Signed)
"  Attempted to walk pt per MD Orval, pt unsteady on feet with a shuffling gate   Pt leaning to right side when standing and walking   MD Orval made aware   "

## 2024-12-03 ENCOUNTER — Inpatient Hospital Stay: Payer: Medicare (Managed Care) | Primary: Internal Medicine

## 2024-12-03 ENCOUNTER — Inpatient Hospital Stay: Admit: 2024-12-03 | Payer: Medicare (Managed Care) | Primary: Internal Medicine

## 2024-12-03 LAB — ECHO (TTE) COMPLETE (PRN CONTRAST/BUBBLE/STRAIN/3D)
AR Max Velocity PISA: 3.6 m/s
AR PHT: 731.3 ms
AV Area by Peak Velocity: 1.1 cm2
AV Area by VTI: 1.1 cm2
AV Mean Gradient: 13 mmHg
AV Mean Velocity: 1.7 m/s
AV Peak Gradient: 22 mmHg
AV Peak Velocity: 2.3 m/s
AV VTI: 42 cm
AV Velocity Ratio: 0.3
AVA/BSA Peak Velocity: 0.6 cm2/m2
AVA/BSA VTI: 0.6 cm2/m2
Ao Root Index: 1.71 cm/m2
Aortic Root: 3.2 cm
Ascending Aorta Index: 1.71 cm/m2
Ascending Aorta: 3.2 cm
Body Surface Area: 1.91 m2
EF BP: 57 % (ref 55–100)
EF Physician: 57 %
Est. RA Pressure: 3 mmHg
Fractional Shortening 2D: 31 % (ref 28–44)
IVSd: 1.3 cm — AB (ref 0.6–0.9)
LA Volume A-L A4C: 62 mL — AB (ref 22–52)
LA Volume A-L A4C: 70 mL — AB (ref 22–52)
LA Volume A/L: 79 mL
LA Volume BP: 75 mL — AB (ref 22–52)
LA Volume Index A-L A2C: 33 mL/m2 (ref 16–34)
LA Volume Index A-L A4C: 37 mL/m2 — AB (ref 16–34)
LA Volume Index A/L: 42 mL/m2 (ref 16–34)
LA Volume Index BP: 40 mL/m2 — AB (ref 16–34)
LA Volume Index MOD A2C: 32 mL/m2 (ref 16–34)
LA Volume Index MOD A4C: 36 mL/m2 — AB (ref 16–34)
LA Volume MOD A2C: 59 mL — AB (ref 22–52)
LA Volume MOD A4C: 67 mL — AB (ref 22–52)
LV EDV A2C: 40 mL
LV EDV A4C: 41 mL
LV EDV BP: 41 mL — AB (ref 56–104)
LV EDV Index A2C: 21 mL/m2
LV EDV Index A4C: 22 mL/m2
LV EDV Index BP: 22 mL/m2
LV ESV A2C: 16 mL
LV ESV A4C: 19 mL
LV ESV BP: 18 mL — AB (ref 19–49)
LV ESV Index A2C: 9 mL/m2
LV ESV Index A4C: 10 mL/m2
LV ESV Index BP: 10 mL/m2
LV Ejection Fraction A2C: 60 %
LV Ejection Fraction A4C: 55 %
LV Mass 2D Index: 95.3 g/m2 — AB (ref 43–95)
LV Mass 2D: 178.2 g — AB (ref 67–162)
LV RWT Ratio: 0.52
LVIDd Index: 2.25 cm/m2
LVIDd: 4.2 cm (ref 3.9–5.3)
LVIDs Index: 1.55 cm/m2
LVIDs: 2.9 cm
LVOT Area: 3.5 cm2
LVOT Diameter: 2.1 cm
LVOT Mean Gradient: 1 mmHg
LVOT Peak Gradient: 2 mmHg
LVOT Peak Velocity: 0.7 m/s
LVOT SV: 45.8 mL
LVOT Stroke Volume Index: 24.1 mL/m2
LVOT VTI: 13 cm
LVOT:AV VTI Index: 0.31
LVPWd: 1.1 cm — AB (ref 0.6–0.9)
PV Max Velocity: 1 m/s
PV Peak Gradient: 4 mmHg
RA Volume: 31 mL
RA Volume: 31 mL
RV Basal Dimension: 3.5 cm
RV Free Wall Peak S': 14.9 cm/s
RVSP: 41 mmHg
TAPSE: 1.9 cm (ref 1.7–?)
TR Max Velocity: 3.09 m/s
TR Peak Gradient: 38 mmHg

## 2024-12-03 LAB — BASIC METABOLIC PANEL
Anion Gap: 14 mmol/L (ref 3.0–18.0)
BUN/Creatinine Ratio: 18 (ref 12–20)
BUN: 13 mg/dL (ref 6–23)
CO2: 24 mmol/L (ref 21–32)
Calcium: 11.6 mg/dL — ABNORMAL HIGH (ref 8.6–10.2)
Chloride: 102 mmol/L (ref 98–107)
Creatinine: 0.71 mg/dL (ref 0.6–1.3)
Est, Glom Filt Rate: 81 ml/min/1.73m2 (ref >60)
Glucose: 150 mg/dL — ABNORMAL HIGH (ref 74–108)
Potassium: 3.8 mmol/L (ref 3.5–5.5)
Sodium: 140 mmol/L (ref 136–145)

## 2024-12-03 LAB — CBC
Hematocrit: 48.2 % — ABNORMAL HIGH (ref 35.0–45.0)
Hemoglobin: 15.8 g/dL (ref 12.0–16.0)
MCH: 30.4 pg (ref 24.0–34.0)
MCHC: 32.8 g/dL (ref 31.0–37.0)
MCV: 92.7 FL (ref 78.0–100.0)
MPV: 11.3 FL (ref 9.2–11.8)
Nucleated RBCs: 0 /100{WBCs}
Platelets: 249 K/uL (ref 135–420)
RBC: 5.2 M/uL (ref 4.20–5.30)
RDW: 13.2 % (ref 11.6–14.5)
WBC: 11.5 K/uL (ref 4.6–13.2)
nRBC: 0 K/uL (ref 0.00–0.01)

## 2024-12-03 LAB — EKG 12-LEAD
Atrial Rate: 98 {beats}/min
Q-T Interval: 376 ms
QRS Duration: 102 ms
QTc Calculation (Bazett): 414 ms
R Axis: -6 degrees
T Axis: -55 degrees
Ventricular Rate: 73 {beats}/min

## 2024-12-03 LAB — LIPID PANEL
Chol/HDL Ratio: 3.3 (ref 0–5.0)
Cholesterol, Total: 185 mg/dL
HDL: 56 mg/dL (ref 40–60)
LDL Cholesterol: 104 mg/dL — ABNORMAL HIGH (ref 0–100)
Triglycerides: 126 mg/dL (ref 0–150)
VLDL Cholesterol Calculated: 25 mg/dL

## 2024-12-03 LAB — HEMOGLOBIN A1C
Estimated Avg Glucose: 122 mg/dL
Hemoglobin A1C: 5.9 % — ABNORMAL HIGH (ref 4.2–5.6)

## 2024-12-03 LAB — POCT GLUCOSE
POC Glucose: 141 mg/dL — ABNORMAL HIGH (ref 70–110)
POC Glucose: 152 mg/dL — ABNORMAL HIGH (ref 70–110)

## 2024-12-03 MED ORDER — ASPIRIN 300 MG RE SUPP
300 | Freq: Once | RECTAL | Status: AC
Start: 2024-12-03 — End: 2024-12-03
  Administered 2024-12-03: 11:00:00 300 mg via RECTAL

## 2024-12-03 MED ORDER — SODIUM CHLORIDE 0.9 % IV SOLN
0.9 | INTRAVENOUS | Status: DC
Start: 2024-12-03 — End: 2024-12-05
  Administered 2024-12-03 – 2024-12-04 (×2): via INTRAVENOUS

## 2024-12-03 MED ORDER — IOPAMIDOL 76 % IV SOLN
76 | Freq: Once | INTRAVENOUS | Status: AC | PRN
Start: 2024-12-03 — End: 2024-12-03
  Administered 2024-12-03: 10:00:00 80 mL via INTRAVENOUS

## 2024-12-03 MED ORDER — PROCHLORPERAZINE EDISYLATE 10 MG/2ML IJ SOLN
10 | Freq: Four times a day (QID) | INTRAMUSCULAR | Status: DC | PRN
Start: 2024-12-03 — End: 2024-12-03
  Administered 2024-12-03: 08:00:00 10 mg via INTRAVENOUS

## 2024-12-03 MED ORDER — PROCHLORPERAZINE EDISYLATE 10 MG/2ML IJ SOLN
10 | Freq: Four times a day (QID) | INTRAMUSCULAR | Status: DC | PRN
Start: 2024-12-03 — End: 2024-12-03

## 2024-12-03 MED ORDER — ASPIRIN 325 MG PO TABS
325 | Freq: Once | ORAL | Status: DC
Start: 2024-12-03 — End: 2024-12-03

## 2024-12-03 MED ORDER — PROCHLORPERAZINE EDISYLATE 10 MG/2ML IJ SOLN
10 | Freq: Four times a day (QID) | INTRAMUSCULAR | Status: DC | PRN
Start: 2024-12-03 — End: 2024-12-07

## 2024-12-03 MED FILL — REFRESH 1.4-0.6 % OP SOLN: 1.4-0.6 % | OPHTHALMIC | Qty: 2

## 2024-12-03 MED FILL — ONDANSETRON HCL 4 MG/2ML IJ SOLN: 4 MG/2ML | INTRAMUSCULAR | Qty: 2

## 2024-12-03 NOTE — Progress Notes (Signed)
 "Progress Note  Hospitalist Service    Patient: Emily Henson MRN: [id number]   SSN: [id number]  Date of Birth: Aug 09, 1933   Age: 89 y.o.  Sex: female      Admit Date: 12/02/2024    LOS: 1 day   Chief Complaint   Patient presents with    Extremity Weakness       Subjective:     Patient seen and examined.  Patient with advanced dementia.  Thus far family have been unable to locate pacemaker card.  Patient was some nausea and vomiting this morning    Objective:     Vitals:  BP (!) 152/111 Comment: Notified Nurse Sarah  Pulse 51   Temp 97.3 F (36.3 C) (Axillary)   Resp 17   Ht 1.651 m (5' 5)   Wt 79.8 kg (176 lb)   SpO2 96%   BMI 29.29 kg/m     Physical Exam:   General appearance: appears stated age, cooperative, and fatigued  Lungs: clear to auscultation bilaterally  Heart: regular rate and rhythm, S1, S2 normal, no murmur, click, rub or gallop  Abdomen: soft, non-tender; bowel sounds normal; no masses,  no organomegaly  Pulses: 2+ and symmetric  Skin: Skin color, texture, turgor normal. No rashes or lesions  Neuro:  Sleepy (difficult to arouse). Moving all extremities.     Intake and Output:  Current Shift: No intake/output data recorded.  Last three shifts: No intake/output data recorded.    Lab/Data Review:  Recent Results (from the past 12 hours)   POCT Glucose    Collection Time: 12/03/24  1:49 AM   Result Value Ref Range    POC Glucose 141 (H) 70 - 110 mg/dL   POCT Glucose    Collection Time: 12/03/24  4:24 AM   Result Value Ref Range    POC Glucose 152 (H) 70 - 110 mg/dL   CBC    Collection Time: 12/03/24  5:31 AM   Result Value Ref Range    WBC 11.5 4.6 - 13.2 K/uL    RBC 5.20 4.20 - 5.30 M/uL    Hemoglobin 15.8 12.0 - 16.0 g/dL    Hematocrit 51.7 (H) 35.0 - 45.0 %    MCV 92.7 78.0 - 100.0 FL    MCH 30.4 24.0 - 34.0 PG    MCHC 32.8 31.0 - 37.0 g/dL    RDW 86.7 88.3 - 85.4 %    Platelets 249 135 - 420 K/uL    MPV 11.3 9.2 - 11.8 FL    Nucleated RBCs 0.0 0 PER 100 WBC    nRBC 0.00 0.00 - 0.01 K/uL    Hemoglobin A1c    Collection Time: 12/03/24  5:31 AM   Result Value Ref Range    Hemoglobin A1C 5.9 (H) 4.2 - 5.6 %    Estimated Avg Glucose 122 mg/dL   Lipid Panel    Collection Time: 12/03/24  5:31 AM   Result Value Ref Range    Cholesterol, Total 185 MG/DL    Triglycerides 873 0 - 150 MG/DL    HDL 56 40 - 60 MG/DL    LDL Cholesterol 895 (H) 0 - 100 MG/DL    VLDL Cholesterol Calculated 25 MG/DL    Chol/HDL Ratio 3.3 0 - 5.0     Basic Metabolic Panel    Collection Time: 12/03/24  5:31 AM   Result Value Ref Range    Sodium 140 136 - 145 mmol/L    Potassium 3.8 3.5 -  5.5 mmol/L    Chloride 102 98 - 107 mmol/L    CO2 24 21 - 32 mmol/L    Anion Gap 14 3.0 - 18.0 mmol/L    Glucose 150 (H) 74 - 108 mg/dL    BUN 13 6 - 23 MG/DL    Creatinine 9.28 0.6 - 1.3 MG/DL    BUN/Creatinine Ratio 18 12 - 20      Est, Glom Filt Rate 81 >60 ml/min/1.22m2    Calcium  11.6 (H) 8.6 - 10.2 MG/DL         Key Findings or tests:       Telemetry NONE   Oxygen NONE     Assessment and Plan:     #Slurred speech  #CVA vs. TIA - symptomology is congruent with posterior CVA.   #Nausea and vomiting   - Telemetry + neuro checks q4h  - Atorvastatin  + home med Eliquis   - Will restart home antihypertensives as 24 hour mark post-CVA is ending.   - IV fluids w/ LR at rate of 100 cc/h  - CTA head/neck: severe stenosis of L PCA. No LVO.  - CT head: moderate microvascular ischemic changes  - MRI brain pending - family unable to located card for pacemaker. Will order CT for 12/18 if they are unable to located by tonight.   - Maintain HOB >30 degrees for aspiration precautions     #Persistent Afib RVR, on Eliquis   #S/P single chamber pacemaker placement 09/25/12  - Concern of cardioembolic stroke, echo ordered  - Cardio consult pending, recommendations appreciated  - Echo 04/04/24 - EF 53%, visually mod enlarged LA  - 14d holter 04/04/24 - 100% AF, average HR 75 bpm, no symptoms     #HTN  - Home med Lopressor   Hold in setting of stroke R/O for permissive HTN      #Alzheimer's dementia  - Home meds Donepezil  + Memantine   - Qtc 414 ms, ok to continue home meds     #HLD  #CAD  - Home med Fenofibrate   - High dose statin per stroke tx protocol    Nausea  -PRN compazine  and zofran  on board.       Diet    DVT Prophylax    GI Prophylaxis    Code status    Disposition Likely dc in 24-48 hours         Zane LOISE Hurst, DO, hospitalist   December 03, 2024        "

## 2024-12-03 NOTE — Plan of Care (Signed)
 "  Problem: Occupational Therapy - Adult  Goal: By Discharge: Performs self-care activities at highest level of function for planned discharge setting.  See evaluation for individualized goals.  Description: Occupational Therapy Goals:  Initiated 12/03/2024 to be met within 7-10 days.    1.  Patient will perform self-feeding with minimal assistance.   2.  Patient will perform grooming with minimal assistance.  3.  Patient will perform upper body dressing  with minimal assistance.  4.  Patient will perform toilet transfers with moderate assistance.  5.  Patient will perform all aspects of toileting with moderate assistance.  6.  Patient will participate in upper extremity therapeutic exercise/activities with minimal assistance for 8-10 minutes to increase strength/endurance for ADLs.    7.  Patient will utilize energy conservation techniques during functional activities with verbal and tactile cues.    PLOF: Provided by pt's Daughter at bedside.  Pt lives with her Daughter, did have a caretaker to be with her when she wasn't there but no longer working with her, in a single story home with 4 STE with no handrails.  Pt was ambulating short distances in home with Min/ModA from family utilizing no AD except her blind cane/white cane and required Min/ModA for bed mobility as well. Requires assist for dressing, however refuses assist for bathing. Questionable incontinence at home. Pt spending more time in bed/recliner as of recent.        Outcome: Progressing   Recommendations for interdisciplinary care: up with assist x 2 for SPT to BSC/recliner     OCCUPATIONAL THERAPY EVALUATION    Patient: Emily Henson (89 y.o. female)  Date: 12/03/2024  Primary Diagnosis: Hypercalcemia [E83.52]  Acute coronary syndrome (HCC) [I24.9]  Chronic atrial fibrillation (HCC) [I48.20]  Acute CVA (cerebrovascular accident) (HCC) [I63.9]  Longstanding persistent atrial fibrillation (HCC) [I48.11]  Cerebrovascular accident (CVA), unspecified  mechanism (HCC) [I63.9]       Precautions: Fall Risk, General Precautions, Other (Comment) (pt is blind),  ,  ,  ,  ,  ,  ,      ASSESSMENT :  Pt seen for OT evaluation. Pt with increasing need for assistance at home prior to admission. She is blind at baseline, requires mod-max assist functional mobility and min-max assist ADLs. At this time, pt is requiring overall max assist x1-2 for ADLs and mobility. She appears below her functional baseline, however per discussion with daughter, she has been having a steady decline due to dementia. She may be at her new baseline, however OT will see patient on a 3 day trial to assess for rehab potential.     Recommend for next OT session:  Bedside commode transfer    DEFICITS/IMPAIRMENTS:  Performance deficits / Impairments: Decreased functional mobility ;Decreased endurance;Decreased ADL status;Decreased ROM;Decreased balance;Decreased strength;Decreased safe awareness;Decreased cognition;Decreased high-level IADLs;Decreased vision/visual deficit    Patient will benefit from skilled intervention to address the above impairments.  Patient's rehabilitation potential/Prognosis: Poor.  Factors which may influence rehabilitation potential include:   []              None noted  [x]              Mental ability/status  [x]              Medical condition  [x]              Home/family situation and support systems  [x]              Safety awareness  []   Pain tolerance/management  []              Other:      PLAN :  Recommendations and Planned Interventions:   [x]                Self Care Training                  [x]       Therapeutic Activities  [x]                Functional Mobility Training   [x]       Cognitive Retraining  [x]                Therapeutic Exercises           [x]       Endurance Activities  [x]                Balance Training                    [x]       Neuromuscular Re-Education  []                Visual/Perceptual Training     [x]       Home Safety Training  [x]                 Patient Education                   [x]       Family Training/Education  []                Other (comment):    Frequency/Duration: Patient will be followed by occupational therapy to address goals, 1-2 times per day/3-5 days per week to address goals.    Further Equipment Recommendations for Discharge: hospital bed    AMPAC: AM-PAC Inpatient Daily Activity Raw Score: 9    Current research shows that an AM-PAC score of 17 or less is not associated with a discharge to the patient's home setting.    This AMPAC score should be considered in conjunction with interdisciplinary team recommendations to determine the most appropriate discharge setting. Patient's social support, diagnosis, medical stability, and prior level of function should also be taken into consideration.     SUBJECTIVE:   Patient stated, yeah.    OBJECTIVE DATA SUMMARY:     Past Medical History:   Diagnosis Date    Arthritis     CAD (coronary artery disease)     Dementia (HCC)     Hearing loss     History of abnormal Holter exam 04/07/2024    14 day monitor 04/07/24 100% AF average HR 75 bpm, no symptoms    Hypercholesterolemia     hypertriglycerides    Hypertension     Permanent atrial fibrillation (HCC)     Unclear onset. VVI pacing since 2013. Rates now controlled. 100% AF per 14d monitor 04/07/24.    Sick sinus syndrome [facility])      Past Surgical History:   Procedure Laterality Date    APPENDECTOMY      BREAST REDUCTION SURGERY N/A 1990    CARDIAC PACEMAKER PLACEMENT  09/25/2012    Single chamber Abbott pacemaker implantation for SSS    CHOLECYSTECTOMY N/A 2004    COLECTOMY  2011    Partial colectomy 2011 for diverticulosis    COLONOSCOPY      HYSTERECTOMY, TOTAL ABDOMINAL (CERVIX REMOVED) N/A 1978       Home Situation:  Social/Functional History  Lives With: Daughter  Type of Home: House  Home Layout: One level  Home Access: Stairs to enter with rails  Entrance Stairs - Number of Steps: 4  Entrance Stairs - Rails: None  Home Equipment:  None  Receives Help From: Family  Prior Level of Assist for Ambulation: Needs assistance  Prior Level of Assist for Transfers: Needs assistance  []   Right hand dominant   []   Left hand dominant    Cognitive/Behavioral Status:  Orientation  Overall Orientation Status: Impaired  Orientation Level: Oriented to person  Cognition  Overall Cognitive Status: Exceptions  Arousal/Alertness: Delayed responses to stimuli  Following Commands: Follows one step commands with increased time;Follows one step commands consistently  Attention Span: Difficulty dividing attention  Memory: Impaired  Safety Judgement: Impaired  Problem Solving: Impaired  Insights: Decreased awareness of deficits  Initiation: Requires cues for all  Sequencing: Requires cues for all    Skin: visible skin intact   Edema: none noted    Vision/Perceptual:    Vision  Vision: Impaired  Vision Exceptions: Legally blind        Coordination: BUE  Coordination: Generally decreased, functional      Balance:  Balance  Sitting: Impaired  Sitting - Static: Fair (occasional)  Sitting - Dynamic: Fair (occasional)  Standing: Impaired  Standing - Static: Poor  Standing - Dynamic: Poor    Strength: BUE  Strength: Generally decreased, functional    Range of Motion: BUE  AROM: Generally decreased, functional  PROM: Generally decreased, functional    Functional Mobility and Transfers for ADLs:  Bed Mobility:  Bed Mobility Training  Bed Mobility Training: Yes  Supine to Sit: Substantial/Maximal assistance  Sit to Supine: Substantial/Maximal assistance  Scooting: Substantial/Maximal assistance  Transfers:  Art Therapist: Yes  Sit to Stand: Partial/Moderate assistance;2 Person assistance  Stand to Sit: Partial/Moderate assistance;2 Person assistance    ADL Assessment:   Feeding: Maximum assistance  Grooming: Maximum assistance  UE Bathing: Maximum assistance  LE Bathing: Maximum assistance  UE Dressing: Maximum assistance  LE Dressing:  Dependent/Total  Toileting: Dependent/Total    Pain:  Intensity Pre-treatment: 0/10   Intensity Post-treatment: 0/10  Scale: Numeric Rating Scale    Activity Tolerance:   Activity Tolerance: Treatment limited secondary to decreased cognition  Please refer to the flowsheet for vital signs taken during this treatment.    After treatment:   []  Patient left in no apparent distress sitting up in chair  [x]  Patient left in no apparent distress in bed  [x]  Call bell left within reach  [x]  Nursing notified  [x]  Caregiver present  [x]  Bed/chair alarm activated  []  No alarm    COMMUNICATION/EDUCATION:   Patient Education  Education Given To: Patient;Family  Education Provided: Plan of Care;Role of Therapy;ADL Adaptive Strategies;Transfer Training;Mobility Training  Education Method: Teach Back  Barriers to Learning: Cognition;Vision  Education Outcome: Continued education needed    Thank you for this referral.  Waddell Halim, OT  Minutes: 18    Eval Complexity: Decision Making: High Complexity    "

## 2024-12-03 NOTE — Progress Notes (Signed)
"  Informed RN that at this time patient is unable to undergo MRI safely without having any information about the patient implanted cardiac pacemaker, unless patient or a family memeber can locate patient implant card with model number on it. There is know information within patient chart about the patients implanted pacemaker  "

## 2024-12-03 NOTE — Consults (Signed)
"  Impression:  Possible small vessel stroke    Plan:  MRI brain  Continue Eliquis   Monitor clinical course    HPI:  This is an 89 year old woman with advanced dementia, atrial fibrillation on Eliquis  who presented from home after developing dysarthria, ataxia and drowsiness.  She was brought in as a stroke alert.  Her initial CT and CT angiogram were both unremarkable.  Given her anticoagulation, she was not a candidate for intervention.  Last night she reportedly had increased dysarthria and a repeat stroke alert was instituted.  Repeat CT scan of the brain and CT angiogram were both unremarkable as well.  Patient has been sleepy this morning due to her disruptive night.  Her daughter reports that her dementia is fairly advanced.  She recognizes family and can usually feed herself.  She can partially assist in bathing.  She cannot find her way around the house.  She needs assistance in dressing.  Her gait is reasonably steady..  She has been compliant with her Eliquis .  Her lab work was otherwise unremarkable including urine analysis.    PMH: as noted:    Medication list was reviewed.    Family history is noncontributory.    10 system review of systems is unobtainable.    Physical exam:  Generally: Well-nourished in no acute distress.  HEENT: Pupils equal and reactive.  Conjunctiva clear.  Oropharynx clear.  Neck: Supple without lymphadenopathy.  Cardiovascular: Regular rate and rhythm.  Lungs: Clear to auscultation  Abdomen: Soft and nontender.  Skin: No rash  Extremities: No cyanosis clubbing or edema  Neurologic exam:  Alert and oriented x3.  Speech is fluent and clear.  Follows commands appropriately.  Cranial nerves symmetric appearing face.  Tone is symmetric.  She is very sleepy and did not awaken to cooperate with the exam.  No abnormal movements were seen.  Reflexes were symmetric and physiologic.  Plantar responses are downgoing.    Laboratory studies were reviewed.    Imaging studies were  reviewed.    Summary:  This is an 89 year old woman who reportedly had some significant dysarthria and unsteadiness.  Last night she had some variations along with nausea and vomiting.  There is no reported vertigo.  Her imaging thus far has not shown any ischemic changes.  Her symptoms remain suspicious for a posterior circulation stroke.  I will review the MRI when available.  We could consider advancing her treatment with low-dose aspirin  if ischemia is confirmed.    Thank you for allow me to participate in the care of this patient.  I can be reached at [phone].    "

## 2024-12-03 NOTE — Progress Notes (Signed)
"  MRI screening form needs to be filled out and faxed to [phone] BEFORE MRI can be scheduled.  If unable to obtain information from patient , [employer] needs to be contacted . If patient is claustrophobic or will needs pain meds, please have ordered in advance in order to facilitate exam.   "

## 2024-12-03 NOTE — Progress Notes (Signed)
"  Normal Pacemaker MRI process for routine and STAT mri studies that must be done before a pacemaker patient can be scanned:     The MRI Technologist to check MRI conditionality of the pacemaker (and ensures that there are no abandoned leads or other implanted devices). If the pacemaker is deemed safe to scan;     The [facility] [employer] [facility] REP  comes to the MRI department during normal business hours.      An order from the Cardiologist with the pacemaker settings that will be used during the mri by the [facility] [employer] REP representative must be given to the representative before the mri can be started.      The pacemaker is then placed in MRI Surescan mode by the [facility] REP   representative in the [facility] department immediately before the mri.      An ACLS certified staff member monitors the patient during the study.     The [facility] REP representative then places the pacemaker back in therapy mode immediately after the mri is complete.   "

## 2024-12-03 NOTE — Significant Event (Signed)
 "  [facility]  Rapid/Medical Response Team Note      Patient:    Emily Henson 89 y.o. female, DOB: [birth year]  Patient MRN: [id number]    Admission Date: 12/02/2024   Admission Diagnosis: Hypercalcemia [E83.52]  Acute coronary syndrome (HCC) [I24.9]  Chronic atrial fibrillation (HCC) [I48.20]  Acute CVA (cerebrovascular accident) (HCC) [I63.9]  Longstanding persistent atrial fibrillation (HCC) [I48.11]  Cerebrovascular accident (CVA), unspecified mechanism (HCC) [I63.9]      RAPID RESPONSE  Rapid response called for: CODE STROKE     Emily Henson is a 89 y.o.  African American female who presented to the ED via EMS transport due to slurred speech and right-sided facial droop.  CTA head and neck on 12/02/24 showed severe stenosis of L PCA. No LVO. Per nursing, patient's symptom worsened in the last 4 hours. She was not oriented to person, placed which she previously had been. She wasn't able to lift and hold upper and lower extremities. Speech was more slurred. After obtaining CT head, patient's symptoms seemed to improved and she was able to lift bilateral upper and lower extremities and facial droop had improved.    Last well known was 12:00 AM of 12/03/24. Sugar is 152.     OBJECTIVE    RRT/MRT start time:  4:17 AM             RRT/MRT end time:  Vitals:    12/03/24 0500   BP:    Pulse: 93   Resp:    Temp:    SpO2:         Physical Exam:  General:  Alert, cooperative, no acute distress    HEENT: Moist mucous membranes, blind in both eyes.  Pulmonary:  CTAB. Symmetric chest expansion. No accessory muscle use.   Cardiovascular: irregularly irregular   Extremities:  No edema, cyanosis, or clubbing.  Psych: Limited insight. Not anxious or agitated.  Neurologic: Oriented to self only. CN grossly intact. Strength of UE and LE intact but asymmetric, L>R.      ASSESSMENT/PLAN AND DISPOSITION  Emily Henson is a 89 y.o. year old female admitted for Hypercalcemia [E83.52]  Acute coronary syndrome (HCC)  [I24.9]  Chronic atrial fibrillation (HCC) [I48.20]  Acute CVA (cerebrovascular accident) (HCC) [I63.9]  Longstanding persistent atrial fibrillation (HCC) [I48.11]  Cerebrovascular accident (CVA), unspecified mechanism (HCC) [I63.9]  Rapid Response Team/ Medical Response Team Called For: Code stroke    In setting of code stroke completed the below:    Medications Administered During Rapid:  Aspirin  325mg     [id number]    Labs: none    Imaging:       > CT Head w/o contrast: negative    #Slurred speech  #CVA vs. TIA  -Tele neuro recommendation: Given Aspirin  325 mg  - If pt cannot tolerate oral, give Aspirin  300 mg suppository   -Hold Eliquis  for 12/03/2024  -Proceed with echocardiogram today  -Repeat Head CT tomorrow morning. Resume Eliquis  if scan comes back negative.     - neuro checks q4h  - Diet NPO until bedside swallow evaluation              If failed, swallow study with ST              If passed, advance diet as tolerated  - Atorvastatin   - 24h permissive HTN (no higher than 220/110 mmHg)  PRN IV Labetalol  if parameters met  -- Echo, lipid panel, HgA1c in AM  - Maintain HOB >30  degrees for aspiration precautions     #Persistent Afib RVR, on Eliquis   #S/P single chamber pacemaker placement 09/25/12  - Concern of cardioembolic stroke, echo ordered  - Echo 04/04/24 - EF 53%, visually mod enlarged LA  - 14d holter 04/04/24 - 100% AF, average HR 75 bpm, no symptoms     #HTN  - Home med Lopressor   Hold in setting of stroke R/O for permissive HTN    Disposition: 4 south  Patient condition: Guarded      Rollo Kluver, ACNP notified of rapid response and is in agreement with plan.     Primary team resuming care.     [facility] resident Dr. Luke present during Rapid Response.        [name], DO -PGY 1  [facility]  December 03, 2024 5:26 AM    "

## 2024-12-03 NOTE — Consults (Signed)
 "[facility] - Consult Note    Cardiology consultation request from Maurilio Sharps, [place] for evaluation and management/treatment of atrial fibrillation.    Date of  Admission: 12/02/2024  1:25 PM   Primary Care Physician:  Cephas Cletis SAUNDERS, MD    Attending Cardiologist: Dr. Cecillia    Seen and independently examined.  Patient with a known history of persistent atrial fibrillation and sick sinus syndrome with history of remote pacemaker in 2013.  We reviewed her type of device and it is not compatible with an MRI, so CT imaging is the only option for her head.  Unclear if she had a stroke or if this is just altered mental status in the patient with severe dementia.  From a cardiac standpoint would continue her outpatient Eliquis  and her metoprolol  and verapamil  for rate control.  Historically her LV function was normal on an echocardiogram earlier this year.  Follow-up echocardiogram ordered as part of the stroke protocol, will review once completed.    Oliva Cecillia, MD       Assessment:     - Possible CVA vs TIA. CTA head/neck: severe stenosis of L PCA. No LVO. Neurology following.   - Persistent AF with RVR. On Eliquis .   14d holter 04/04/24 - 100% AF, average HR 75 bpm, no symptoms   - History of SSS s/p single chamber Abbott in 09/2012  - Echo 04/04/24 EF 53%  - Hypertension, on lopressor . Currently allowing for permissive HTN  - Dementia   - Hyperlipidemia    Primary cardiologist: [facility]     Plan:     - PPM IS NOT MRI compatible   - In rate controlled AF, continue lopressor    - OAC with Eliquis    - Echocardiogram ordered, will review when available  - CVA workup/management per primary team     History of Present Illness:     This is a 89 y.o. female admitted for Hypercalcemia [E83.52]  Acute coronary syndrome (HCC) [I24.9]  Chronic atrial fibrillation (HCC) [I48.20]  Acute CVA (cerebrovascular accident) (HCC) [I63.9]  Longstanding persistent atrial fibrillation (HCC)  [I48.11]  Cerebrovascular accident (CVA), unspecified mechanism (HCC) [I63.9].    Patient with a PMH as stated above who presented to the ED with slurred speech and right sided facial droop on 12/02/24. History obtained via record review and daughter at bedside. CTA head/neck showed severe stenosis of L PCA, no LVO. Cardiology is being consulted for atrial fibrillation.     Cardiac risk factors: advanced age (older than 70 for men, 37 for women), dyslipidemia, and hypertension      Review of Symptoms:  Except as stated above include:  Constitutional:  negative  Respiratory:  negative  Cardiovascular:  negative  Gastrointestinal: negative  Genitourinary:  negative  Musculoskeletal:  Negative  Neurological:  Negative  Dermatological:  Negative  Endocrinological: Negative  Psychological:  Negative    Pertinent items are noted in HPI.     Past Medical History:     Past Medical History:   Diagnosis Date    Arthritis     CAD (coronary artery disease)     Dementia (HCC)     Hearing loss     History of abnormal Holter exam 04/07/2024    14 day monitor 04/07/24 100% AF average HR 75 bpm, no symptoms    Hypercholesterolemia     hypertriglycerides    Hypertension     Permanent atrial fibrillation (HCC)     Unclear onset. VVI pacing since 2013.  Rates now controlled. 100% AF per 14d monitor 04/07/24.    Sick sinus syndrome [facility])          Social History:     Social History     Socioeconomic History    Marital status: Widowed     Spouse name: None    Number of children: None    Years of education: None    Highest education level: None   Tobacco Use    Smoking status: Former    Smokeless tobacco: Never   Substance and Sexual Activity    Alcohol  use: Yes     Comment: occ    Drug use: No    Sexual activity: Not Currently     Social Drivers of Health     Financial Resource Strain: Low Risk  (07/19/2023)    Overall Financial Resource Strain (CARDIA)     Difficulty of Paying Living Expenses: Not hard at all   Food Insecurity: No Food  Insecurity (12/02/2024)    Hunger Vital Sign     Worried About Running Out of Food in the Last Year: Never true     Ran Out of Food in the Last Year: Never true   Transportation Needs: No Transportation Needs (12/02/2024)    PRAPARE - Therapist, Art (Medical): No     Lack of Transportation (Non-Medical): No   Physical Activity: Inactive (04/17/2024)    Exercise Vital Sign     Days of Exercise per Week: 0 days     Minutes of Exercise per Session: 0 min   Social Connections: Feeling Socially Integrated (02/20/2023)    OASIS D0700: Social Isolation     Frequency of experiencing loneliness or isolation: Rarely   Housing Stability: Low Risk  (12/02/2024)    Housing Stability Vital Sign     Unable to Pay for Housing in the Last Year: No     Number of Times Moved in the Last Year: 0     Homeless in the Last Year: No        Family History:     Family History   Problem Relation Age of Onset    Diabetes Mother     Hypertension Mother     Alzheimer's Disease Sister     Sleep Apnea Sister         Medications:     Allergies   Allergen Reactions    Caffeine Palpitations        Current Facility-Administered Medications   Medication Dose Route Frequency    prochlorperazine  (COMPAZINE ) injection 5 mg  5 mg IntraVENous Q6H PRN    sodium chloride  flush 0.9 % injection 5-40 mL  5-40 mL IntraVENous 2 times per day    sodium chloride  flush 0.9 % injection 5-40 mL  5-40 mL IntraVENous PRN    0.9 % sodium chloride  infusion   IntraVENous PRN    potassium chloride  (KLOR-CON  M) extended release tablet 40 mEq  40 mEq Oral PRN    Or    potassium bicarb-citric acid  (EFFER-K) effervescent tablet 40 mEq  40 mEq Oral PRN    Or    potassium chloride  10 mEq/100 mL IVPB (Peripheral Line)  10 mEq IntraVENous PRN    magnesium  sulfate 2000 mg in 50 mL IVPB premix  2,000 mg IntraVENous PRN    acetaminophen  (TYLENOL ) tablet 650 mg  650 mg Oral Q4H PRN    Or    acetaminophen  (TYLENOL ) suppository 650 mg  650 mg Rectal  Q4H PRN     ondansetron  (ZOFRAN -ODT) disintegrating tablet 4 mg  4 mg Oral Q8H PRN    Or    ondansetron  (ZOFRAN ) injection 4 mg  4 mg IntraVENous Q6H PRN    polyethylene glycol (GLYCOLAX ) packet 17 g  17 g Oral Daily PRN    atorvastatin  (LIPITOR ) tablet 80 mg  80 mg Oral Nightly    labetalol  (NORMODYNE ;TRANDATE ) injection 10 mg  10 mg IntraVENous Q10 Min PRN    donepezil  (ARICEPT ) tablet 10 mg  10 mg Oral Daily    metoprolol  tartrate (LOPRESSOR ) tablet 100 mg  100 mg Oral BID    verapamil  (CALAN  SR) extended release tablet 240 mg  240 mg Oral Daily    memantine  (NAMENDA ) tablet 5 mg  5 mg Oral BID    apixaban  (ELIQUIS ) tablet 5 mg  5 mg Oral BID    calcium  carbonate tablet 600 mg  1 tablet Oral Daily    Vitamin D  (CHOLECALCIFEROL ) tablet 2,000 Units  2,000 Units Oral Daily    vitamin B-12 (CYANOCOBALAMIN ) tablet 1,000 mcg  1,000 mcg Oral Daily    Polyvinyl Alcohol -Povidone PF (REFRESH) 1.4-0.6 % ophthalmic solution 2 drop  2 drop Both Eyes BID    meclizine  (ANTIVERT ) tablet 25 mg  25 mg Oral Q6H PRN    vitamin E  capsule 400 Units  400 Units Oral Daily         Physical Exam:     Vitals:    12/03/24 1152   BP: (!) 152/111   Pulse: 51   Resp: 17   Temp: 97.3 F (36.3 C)   SpO2: 96%       TELE: AFIB    BP Readings from Last 3 Encounters:   12/03/24 (!) 152/111   10/21/24 128/82   04/17/24 (!) 149/98     Pulse Readings from Last 3 Encounters:   12/03/24 51   10/21/24 97   04/17/24 97     Wt Readings from Last 3 Encounters:   12/02/24 79.8 kg (176 lb)   10/21/24 79.8 kg (176 lb)   04/17/24 74.4 kg (164 lb)       General:  in NAD  Neck:  no JVD  Lungs:  clear to auscultation bilaterally  Heart:  irregularly irregular rhythm  Abdomen:  abdomen is soft without significant tenderness, masses, organomegaly or guarding  Extremities:  extremities normal, atraumatic, no cyanosis or edema  Skin: warm and dry, no hyperpigmentation, vitiligo, or suspicious lesions  Neuro: responsive to name       Data Review:     Recent Labs     12/02/24  1339  12/03/24  0531   WBC 7.1 11.5   HGB 15.5 15.8   HCT 46.6* 48.2*   PLT 248 249     Recent Labs     12/02/24  1339 12/03/24  0531   NA 141 140   K 3.9 3.8   CL 103 102   CO2 28 24   BUN 12 13   MG 1.8  --    ALT 13  --    INR 1.3*  --        @LASTCARDPROC @    All Cardiac Markers in the last 24 hours:    No results found for: TROPHS  No results found for: BNP    Last Lipid:    Lab Results   Component Value Date/Time    CHOL 185 12/03/2024 05:31 AM    HDL 56 12/03/2024 05:31 AM  LDL 104 12/03/2024 05:31 AM    LDL 86.6 03/26/2023 10:48 AM       Cardiographics:     Encounter Date: 12/02/24   EKG 12 Lead (Abn HR)   Result Value    Ventricular Rate 73    Atrial Rate 98    QRS Duration 102    Q-T Interval 376    QTc Calculation (Bazett) 414    R Axis -6    T Axis -55    Diagnosis      Atrial fibrillation  Nonspecific ST and T wave abnormality  Abnormal ECG  No previous ECGs available  Confirmed by Rea, MD, Jun ([id number]) on 12/03/2024 9:32:41 AM       No results found for this or any previous visit.    No results found for this or any previous visit.    No results found for this or any previous visit.    Xray Result (most recent):  No results found for this or any previous visit from the past 3650 days.      Signed By: Jordan L Cox, ACNP     December 03, 2024       "

## 2024-12-03 NOTE — Progress Notes (Signed)
"  Pt's son unable to locate information on pacemaker at home. Pt's daughter is going to look for the information and if she cannot find it was going to look at [facility]  "

## 2024-12-03 NOTE — Progress Notes (Addendum)
"  Messaged by nursing that patient having vomiting despite use of zofran .  Will trial compazine  prn.  Per nursing patient with + BS last BM reported yesterday.     52 - Code stroke was called due to worsening NIH scale per nursing report.  Patient currently off the floor at CT.     0500 -patient seen in following code stroke earlier today.  Strength appears grossly equal at this time, she remains drowsy but readily alerts to stimuli.  She remains confused oriented to person and somewhat to hospital.  Telemetry neurology note from earlier today reviewed and recommends for repeat head CT in 24 hours and consideration for resume Eliquis  if normal.  Patient has gotten aspirin  suppository.  Checking with neurology if alright to give eliquis  or hold (have held for now).      Promethazine dose decreased for now.    0830 - Spoke with Dr. Theodis neurology whom cleared for resumption of eliquis , unheld per recommendation.        Rollo Kluver, NP  "

## 2024-12-03 NOTE — Progress Notes (Signed)
"  Per dr Cecillia note states Seen and independently examined. Patient with a known history of persistent atrial fibrillation and sick sinus syndrome with history of remote pacemaker in 2013. We reviewed her type of device and it is not compatible with an MRI, so CT imaging is the only option for her head. Unclear if she had a stroke or if this is just altered mental status in the patient with severe dementia. From a cardiac standpoint would continue her outpatient Eliquis  and her metoprolol  and verapamil  for rate control. Historically her LV function was normal on an echocardiogram earlier this year. Follow-up echocardiogram ordered as part of the stroke protocol, will review once completed.   "

## 2024-12-03 NOTE — Plan of Care (Signed)
 "  Problem: Physical Therapy - Adult  Goal: By Discharge: Performs mobility at highest level of function for planned discharge setting.  See evaluation for individualized goals.  Description: Physical Therapy Goals:  Initiated 12/03/2024 to be met within 7-10 days.    1.  Patient will move from supine to sit and sit to supine , scoot up and down, and roll side to side in bed with moderate assistance.    2.  Patient will transfer from bed to chair and chair to bed with moderate assistance using the least restrictive device.  3.  Patient will perform sit to stand with moderate assistance.  4.  Patient will ambulate with moderate assistance for 10 feet with the least restrictive device.   5.  Patient will ascend/descend 4 stairs with b/l handrail(s) with moderate assistance.    PLOF: Provided by pt's Daughter at bedside.  Pt lives with her Daughter, did have a caretaker to be with her when she wasn't there but no longer working with her, in a single story home with 4 STE with no handrails.  Pt was ambulating short distances in home with Min/ModA from family utilizing no AD except her blind cane/white cane and required Min/ModA for bed mobility as well.  Pt spending more time in bed/recliner as of recent.    Outcome: Progressing     Recommendations for interdisciplinary mobility: up with assist x 2 for SPT to BSC/recliner    PHYSICAL THERAPY EVALUATION    Patient: Emily Henson (89 y.o. female)  Date: 12/03/2024  Primary Diagnosis: Hypercalcemia [E83.52]  Acute coronary syndrome (HCC) [I24.9]  Chronic atrial fibrillation (HCC) [I48.20]  Acute CVA (cerebrovascular accident) (HCC) [I63.9]  Longstanding persistent atrial fibrillation (HCC) [I48.11]  Cerebrovascular accident (CVA), unspecified mechanism (HCC) [I63.9]       Precautions: Fall Risk, General Precautions, Other (Comment) (pt is blind),  ,  ,  ,  ,  ,  ,      ASSESSMENT :  Pt resting in bed upon entering room, supportive Daughter at bedside and agreeable to PT  evaluation.  Pt admitted for AMS, slurred speech and generalized weakness.  Stroke rule out with CT being (-).  Daughter provided PLOF and home set up, notes pt has had a steady decline in her functional mobility and is now only ambulating very short distances in home to bed<->recliner/kitchen with assistance.  Pt is legally blind and per Daughter is unable to even make out shadows or shapes.  Pt is A&OX1 to person requiring hand over hand assistance for transfers due to visual impairment and cognition.  Supine to sit transfer with MaxA, fair sitting balance with gross b/l LE strength 3+/5.  STS with ModAx2 with HHA and took 2-3 side steps towards [facility] with ModAx2 demonstrating poor standing balance.  Pt returned supine with MaxA and positioned comfortably, left with all needs met and Daughter still present at bedside.  Pt began actively vomiting at end of treatment session, RN notified.        DEFICITS/IMPAIRMENTS:    , Body Structures, Functions, Activity Limitations Requiring Skilled Therapeutic Intervention: Decreased functional mobility ;Decreased tolerance to work activity;Decreased strength;Decreased cognition;Decreased balance    Patient will benefit from skilled intervention to address the above impairments.  Patient's rehabilitation potential/Therapy Prognosis: Fair;Guarded.  Factors which may influence rehabilitation potential include:   []          None noted  [x]          Mental ability/status  [x]   Medical condition  []          Home/family situation and support systems  [x]          Safety awareness  []          Pain tolerance/management  []          Other:      PLAN :  Recommendations and Planned Interventions:   [x]            Bed Mobility Training             [x]     Neuromuscular Re-Education  [x]            Transfer Training                   []     Orthotic/Prosthetic Training  [x]            Gait Training                          []     Modalities  [x]            Therapeutic Exercises           []      Edema Management/Control  [x]            Therapeutic Activities            [x]     Family Training/Education  [x]            Patient Education  []            Other (comment):    Frequency/Duration: Patient will be followed by physical therapy to address goals, 1-2 times per day/3-5 days per week to address goals.    Further Equipment Recommendations for Discharge: TBD    AMPAC: AM-PAC Inpatient Mobility Raw Score : 11      Current research shows that an AM-PAC score of 17 (13 without stairs) or less is not associated with a discharge to the patient's home setting. Vs home with 24/7 family support    This AMPAC score should be considered in conjunction with interdisciplinary team recommendations to determine the most appropriate discharge setting. Patient's social support, diagnosis, medical stability, and prior level of function should also be taken into consideration.     SUBJECTIVE:   Patient stated I'm ok.    OBJECTIVE DATA SUMMARY:     Past Medical History:   Diagnosis Date    Arthritis     CAD (coronary artery disease)     Dementia (HCC)     Hearing loss     History of abnormal Holter exam 04/07/2024    14 day monitor 04/07/24 100% AF average HR 75 bpm, no symptoms    Hypercholesterolemia     hypertriglycerides    Hypertension     Permanent atrial fibrillation (HCC)     Unclear onset. VVI pacing since 2013. Rates now controlled. 100% AF per 14d monitor 04/07/24.    Sick sinus syndrome [facility])      Past Surgical History:   Procedure Laterality Date    APPENDECTOMY      BREAST REDUCTION SURGERY N/A 1990    CARDIAC PACEMAKER PLACEMENT  09/25/2012    Single chamber Abbott pacemaker implantation for SSS    CHOLECYSTECTOMY N/A 2004    COLECTOMY  2011    Partial colectomy 2011 for diverticulosis    COLONOSCOPY      HYSTERECTOMY, TOTAL ABDOMINAL (CERVIX REMOVED) N/A 1978  Home Situation:  Social/Functional History  Lives With: Daughter  Type of Home: House  Home Layout: One level  Home Access: Stairs to enter with  rails  Entrance Stairs - Number of Steps: 4  Entrance Stairs - Rails: None  Home Equipment: None  Receives Help From: Family  Prior Level of Assist for Ambulation: Needs assistance  Prior Level of Assist for Transfers: Needs assistance  Critical Behavior:  Orientation  Overall Orientation Status: Impaired  Orientation Level: Oriented to person  Cognition  Overall Cognitive Status: Exceptions  Arousal/Alertness: Delayed responses to stimuli  Following Commands: Follows one step commands with increased time;Follows one step commands consistently  Attention Span: Difficulty dividing attention  Memory: Impaired  Safety Judgement: Impaired  Problem Solving: Impaired  Insights: Decreased awareness of deficits  Initiation: Requires cues for all  Sequencing: Requires cues for all    Strength:    Strength: Generally decreased, functional    Tone & Sensation:   Tone: Normal  Sensation: Intact    Range Of Motion:  AROM: Generally decreased, functional  PROM: Generally decreased, functional    Functional Mobility:  Bed Mobility:     Bed Mobility Training  Bed Mobility Training: Yes  Supine to Sit: Substantial/Maximal assistance  Sit to Supine: Substantial/Maximal assistance  Scooting: Substantial/Maximal assistance  Transfers:     Art Therapist: Yes  Sit to Stand: Partial/Moderate assistance;2 Person assistance  Stand to Sit: Partial/Moderate assistance;2 Person assistance  Balance:               Balance  Sitting: Impaired  Sitting - Static: Fair (occasional)  Sitting - Dynamic: Fair (occasional)  Standing: Impaired  Standing - Static: Poor  Standing - Dynamic: Poor  Wheelchair Mobility:        Ambulation/Gait Training:       Soil Scientist: Yes  Overall Level of Assistance: 2 Person assistance;Partial/Moderate assistance  Distance (ft): 3 Feet  Speed/Cadence: Slow;Shuffled  Step Length: Right shortened;Left shortened  Gait Abnormalities: Antalgic;Decreased step clearance          Pain:  Intensity  Pre-treatment: 0/10   Intensity Post-treatment: 0/10      Activity Tolerance:   Activity Tolerance: Patient limited by fatigue;Treatment limited secondary to decreased cognition  Please refer to the flowsheet for vital signs taken during this treatment.    After treatment:   []          Patient left in no apparent distress sitting up in chair  [x]          Patient left in no apparent distress in bed  [x]          Call bell left within reach  [x]          Nursing notified  [x]          Caregiver present  [x]          Bed/chair alarm activated  []    No alarm  []          SCDs applied    COMMUNICATION/EDUCATION:   Patient Education  Education Given To: Patient  Education Provided: Role of Therapy;Plan of Art Therapist  Education Method: Verbal;Teach Back  Barriers to Learning: Cognition;Vision  Education Outcome: Verbalized understanding;Demonstrated understanding;Continued education needed    Thank you for this referral.  Jeoffrey Robert, PT  Minutes: 18      Eval Complexity: Decision Making: Medium Complexity     "

## 2024-12-03 NOTE — Progress Notes (Signed)
"  Pt's pacemaker card reads:    Candia Bottom, M.D.  Mabel D. Paola, RN  [facility]  A Department of [facility]  [address], [address]  [place], [place] [address]  Main [phone]  Fax [phone]  "

## 2024-12-03 NOTE — Plan of Care (Signed)
 "  Problem: SLP Adult - Impaired Swallowing  Goal: By Discharge: Advance to least restrictive diet without signs or symptoms of aspiration for planned discharge setting.  See evaluation for individualized goals.  Description: Patient will:  1. Tolerate PO trials with 0 s/s overt distress in 4/5 trials  2. Utilize compensatory swallow strategies/maneuvers (decrease bite/sip, size/rate, alt. liq/sol) with min cues in 4/5 trials    Rec:     Soft/bite sized diet with thin liquids  Aspiration precautions  HOB >45 during po intake, remain >30 for 30-45 minutes after po   Small bites/sips; alternate liquid/solid with slow feeding rate   Oral care TID  Meds per pt preference  Assistance with all PO  Meds crushed  Outcome: Progressing   SPEECH LANGUAGE PATHOLOGY BEDSIDE SWALLOW EVALUATION/TREATMENT    Patient: Emily Henson (89 y.o. female)  Date: 12/03/2024  Primary Diagnosis: Hypercalcemia [E83.52]  Acute coronary syndrome (HCC) [I24.9]  Chronic atrial fibrillation (HCC) [I48.20]  Acute CVA (cerebrovascular accident) (HCC) [I63.9]  Longstanding persistent atrial fibrillation (HCC) [I48.11]  Cerebrovascular accident (CVA), unspecified mechanism (HCC) [I63.9]       Precautions: Aspiration  PLOF: As per H&P  ASSESSMENT:  Based on the objective data described below, the patient presents with mild oral dysphagia. Per daughter at bedside: pt is blind and requires assistance with PO, but was tolerating regular solids prior to admission without difficulty. She reported that speech was very slurred with significant right facial droop/weakness early this AM which has since resolved. Strength/ROM and coordination of orofacial musculature appeared to be [facility] to informal observation. Pt with no dysarthria when answering simple questions. She tolerated reg solids, puree, and thin liquids via straw consecutive swallows without any overt s/sx of aspiration. Labored mastication of reg solids observed with positive oral clearance. Laryngeal  elevation was functional/timely to palpation across trials. Pt safe for soft/bite sized with thin liquids, aspiration precautions, oral care TID, and meds as tolerated.     TREATMENT:  Skilled therapy initiated; Educated patient's daughter on aspiration precautions and importance of compensatory swallow techniques to decrease aspiration risk (decrease rate of intake & sip/bite size, upright @HOB  for all po intake and ~30 minutes after po); verbalized comprehension. Will continue to follow for further dysphagia management.     Patient will benefit from skilled intervention to address the above impairments.  Patient's rehabilitation potential good.  Factors which may influence rehabilitation potential include:   []             None noted  []             Mental ability/status  [x]             Medical condition  []             Home/family situation and support systems  []             Safety awareness  []             Pain tolerance/management  []             Other:      PLAN :  Recommendations and Planned Interventions:  Dysphagia Goals  Requires SLP Intervention: Yes  Recommendations: Dysphagia treatment  D/C Recommendations: To be determined  Solid Consistency Recommendation: Soft & Bite Sized  Liquid Consistency Recommendation: Thin  Compensatory Swallowing Strategies : Upright as possible for all oral intake;Remain upright for 30-45 minutes after meals;Alternate solids and liquids;Assist feed;Eat/Feed slowly;Small bites/sips  Education Topic: Results/ recommendations of diagnostic testing, Role of SLP  Frequency/Duration: Patient will be followed by speech-language pathology 1-2 times per day/3-5 days per week to address goals.    Discharge Recommendations: D/C Recommendations: To be determined     SUBJECTIVE:   Patient stated, I'm thirsty.    OBJECTIVE:     Past Medical History:   Diagnosis Date    Arthritis     CAD (coronary artery disease)     Dementia (HCC)     Hearing loss     History of abnormal Holter exam  04/07/2024    14 day monitor 04/07/24 100% AF average HR 75 bpm, no symptoms    Hypercholesterolemia     hypertriglycerides    Hypertension     Permanent atrial fibrillation (HCC)     Unclear onset. VVI pacing since 2013. Rates now controlled. 100% AF per 14d monitor 04/07/24.    Sick sinus syndrome [facility])      Past Surgical History:   Procedure Laterality Date    APPENDECTOMY      BREAST REDUCTION SURGERY N/A 1990    CARDIAC PACEMAKER PLACEMENT  09/25/2012    Single chamber Abbott pacemaker implantation for SSS    CHOLECYSTECTOMY N/A 2004    COLECTOMY  2011    Partial colectomy 2011 for diverticulosis    COLONOSCOPY      HYSTERECTOMY, TOTAL ABDOMINAL (CERVIX REMOVED) N/A 1978     Prior Level of Function/Home Situation:  Social/Functional History  Lives With: Daughter  Type of Home: House  Home Layout: One level  Home Access: Stairs to enter with rails  Entrance Stairs - Number of Steps: 4  Entrance Stairs - Rails: None  Home Equipment: None  Receives Help From: Family  Prior Level of Assist for Ambulation: Needs assistance  Prior Level of Assist for Transfers: Needs assistance    Daily Assessment:  Baseline Assessment  Respiratory Status: Room air  Communication Observation: Functional  Follows Directions: Simple  Current Diet : NPO  Current Liquid Diet : NPO    Orientation: Person    Oral Assessment:  Oral Motor: Impaired  Oral/Motor  Oral Motor: Impaired  Oral Hygiene: Moist, Clean  Dentition: Natural  Baseline Vocal Quality: Normal  Lingual Strength: Reduced       P.O. Trials:  PO Trials  Neuromuscular Estim Used: No  Assessment Method(s): Observation, Palpation  Patient Position: 55 at [facility]  Consistency Presented: Regular, Thin, Pureed  How Presented: SLP-fed/Presented, Straw, Successive Swallows, Spoon  Bolus Acceptance: No impairment  Bolus Formation/Control: Impaired  Type of Impairment: Delayed, Mastication  Propulsion: No impairment  Oral Residue: None  Initiation of Swallow: No impairment  Laryngeal  Elevation: Functional  Aspiration Signs/Symptoms: None  Pharyngeal Phase Characteristics: No impairment, issues, or problems  Effective Modifications: Alternate liquids/solids, Small sips and bites  Cues for Modifications: Minimal          Dysphagia Outcome Severity Scale:   Dysphagia Outcome Severity Scale: Level 5: Mild dysphagia- Distant supervision. May need one diet consistency restricted    DYSPHAGIA DIAGNOSIS: Dysphagia Diagnosis: Mild oral stage dysphagia    Pain:  Intensity Pre-treatment: 0/10   Intensity Post-treatment: 0/10  Scale: Numeric Rating Scale    After treatment:   [x]             Patient left in no apparent distress in same position as upon arrival  [x]             Call bell left within reach  [x]   Nursing notified  []             Family/caregiver present    COMMUNICATION/EDUCATION:   [x]             Aspiration precautions; swallow safety; compensatory techniques provided via demonstration, verbalization and teach back of comprehension  [x]          Patient/family have participated as able in goal setting and plan of care.  []             Patient/family agree to work toward stated goals and plan of care.  []             Patient understands intent and goals of therapy, neutral about participation.  []             Patient unable to participate in goal setting/plan of care secondary to cognition, hearing/vision deficits; education ongoing with interdisciplinary staff   []             Handout regarding diet recommendations and thickener instructions provided.  [x]          Posted safety precautions in patient's room.    Thank you for this referral.    Tinnie Norrie, M.S., CCC-SLP/L  Speech-Language Pathologist    "

## 2024-12-04 ENCOUNTER — Inpatient Hospital Stay: Admit: 2024-12-04 | Payer: Medicare (Managed Care) | Primary: Internal Medicine

## 2024-12-04 LAB — BASIC METABOLIC PANEL
Anion Gap: 9 mmol/L (ref 3.0–18.0)
BUN/Creatinine Ratio: 18 (ref 12–20)
BUN: 14 mg/dL (ref 6–23)
CO2: 25 mmol/L (ref 21–32)
Calcium: 10.8 mg/dL — ABNORMAL HIGH (ref 8.6–10.2)
Chloride: 105 mmol/L (ref 98–107)
Creatinine: 0.75 mg/dL (ref 0.6–1.3)
Est, Glom Filt Rate: 76 ml/min/1.73m2 (ref >60)
Glucose: 108 mg/dL (ref 74–108)
Potassium: 3.5 mmol/L (ref 3.5–5.5)
Sodium: 138 mmol/L (ref 136–145)

## 2024-12-04 LAB — CBC
Hematocrit: 43.4 % (ref 35.0–45.0)
Hemoglobin: 14.3 g/dL (ref 12.0–16.0)
MCH: 30.9 pg (ref 24.0–34.0)
MCHC: 32.9 g/dL (ref 31.0–37.0)
MCV: 93.7 FL (ref 78.0–100.0)
MPV: 10.9 FL (ref 9.2–11.8)
Nucleated RBCs: 0 /100{WBCs}
Platelets: 236 K/uL (ref 135–420)
RBC: 4.63 M/uL (ref 4.20–5.30)
RDW: 13.4 % (ref 11.6–14.5)
WBC: 11.2 K/uL (ref 4.6–13.2)
nRBC: 0 K/uL (ref 0.00–0.01)

## 2024-12-04 NOTE — Care Coordination (Signed)
 "   12/04/24 0902   Service Assessment   Information Provided By Child/Family   Patient Orientation Alert;Oriented;Person   Cognition Dementia / Early Alzheimer's   Primary Caregiver Self   Support Systems Family Members;Children   Patient's Healthcare Decision Maker is: Named in Scanned ACP Document   PCP Verified by CM Yes   Last Visit to PCP Within last 3 months   Prior Functional Level Assistance with the following:;Dressing;Cooking;Housework;Shopping;Mobility   History of falls? 0   Current Functional Level at Time of Initial Assessment Assistance with the following:;Dressing;Mobility;Feeding   Can patient return to prior living arrangement Yes   Ability to make needs known: Fair   Family able to assist with home care needs: Yes   Other than yourself, who should be included in your transition plan for discharge from the hospital? Daughter   Receives Help From [facility]   Current Community Resources None   Social/Functional History   Prior Level of Assist for ADLs Needs assistance   Toileting Independent   Prior Level of Assist for [employer] Needs assistance   Homemaking Responsibilities No   Prior Level of Assist for Transfers Independent   Ambulation Assistance Needs assistance   Active Driver No   Patient's Nurse, Children's   Education NA   Occupation Retired   Type of Occupation NA   Surveyor, Minerals Prior to Acute Admission [employer]   Lives With Daughter   Current Services Prior To Admission Horticulturist, Commercial   Current DME Prior to Arrival Other (Comment)  (Handles/Riser for toilet)   Potential Assistance Needed Transportation;Skilled Nursing;Skilled Nature Conservation Officer   Potential DME Psychiatrist;Wheelchair;Walker;Bedside Commode;Cane   Potential Assistance Obtaining Medications No   Patient expects to be discharged to: Skilled nursing facility   Home Layout One level   Home Access Stairs to enter with rails   Entrance Stairs - Number  of Steps 4   Entrance Stairs - Rails Both  (But are not stable)   Estate Agent At/After Discharge   [employer] Information Provided? No   Who will provide transportation at discharge? Other (see comment)  (Medical)   Condition of Participation: Discharge Planning   The Plan for Transition of Care is related to the following treatment goals: Not medically stable. Plan is SNF and medical transport needed.   The Patient and/or Patient Representative was provided with a Choice of Provider? Patient Representative   Name of the Patient Representative who was provided with the Choice of Provider and agrees with the Discharge Plan?  [name]   The Patient and/Or Patient Representative agree with the Discharge Plan? Yes   Freedom of Choice list was provided with basic dialogue that supports the patient's individualized plan of care/goals, treatment preferences, and shares the quality data associated with the providers?  No  (To be provided after Huddle)     Case Management Assessment  Initial Evaluation    Date/Time of Evaluation: 12/04/2024 0902  Assessment Completed by: Rolin Hummer      Patient Name: Emily Henson                   Date of Birth: 05-29-1933  Diagnosis: Hypercalcemia [E83.52]  Acute coronary syndrome [facility]) [I24.9]  Chronic atrial fibrillation (HCC) [I48.20]  Acute CVA (cerebrovascular accident) (HCC) [I63.9]  Longstanding persistent atrial fibrillation (HCC) [I48.11]  Cerebrovascular accident (CVA), unspecified mechanism (HCC) [I63.9]  Date / Time: 12/02/2024  1:25 PM      Met with patient in room. Daughter at bedside. Patient not A&O x4, assessment completed with daughter. HIPAA verified. Initial case management admission assessment completed with patient's daughter permission. Patient Demographics verified. Patient lives with daughter in a 1Story home with 4STE. Prior to admission, patient semi-dependent with ADLs. Patient has handles around the  toilet as her only DME.  Patient's daughter preference for disposition at discharge is SNF in [place] or [place]. Medical transport needed.    Agreeable to referral being sent.     Rolin Hummer, MSW   Inpatient Case Management (ICM)   [facility]    "

## 2024-12-04 NOTE — Progress Notes (Signed)
"  Patient has been more alert and cooperative since yesterday.  She was able to eat some breakfast.  She is able to answer questions.  She would follow some simple commands.    Her pacemaker is not MRI compatible.    Assessment:  Given the circumstances, I would continue current anticoagulation with Eliquis .  Given that we cannot document with certainty an acute ischemic event, I would not advance her treatment with an antiplatelet agent.  She would be high risk for complications.  She seems to be improving, therefore hopefully she can be discharged to skilled nursing or home in the near future.    Recall if you have further questions.  "

## 2024-12-04 NOTE — Plan of Care (Signed)
"    Problem: Chronic Conditions and Co-morbidities  Goal: Patient's chronic conditions and co-morbidity symptoms are monitored and maintained or improved  12/04/2024 1420 by Germaine Remak, RN  Outcome: Progressing  Flowsheets (Taken 12/04/2024 1123)  Care Plan - Patient's Chronic Conditions and Co-Morbidity Symptoms are Monitored and Maintained or Improved: Monitor and assess patient's chronic conditions and comorbid symptoms for stability, deterioration, or improvement  12/04/2024 0341 by Geroge Eleanor LABOR, RN  Outcome: Progressing  Flowsheets (Taken 12/04/2024 0341)  Care Plan - Patient's Chronic Conditions and Co-Morbidity Symptoms are Monitored and Maintained or Improved: Monitor and assess patient's chronic conditions and comorbid symptoms for stability, deterioration, or improvement     Problem: Discharge Planning  Goal: Discharge to home or other facility with appropriate resources  Description: Discharge home or to rehab when medically ready  12/04/2024 1420 by Germaine Remak, RN  Outcome: Progressing  Flowsheets (Taken 12/04/2024 1123)  Discharge to home or other facility with appropriate resources: Identify barriers to discharge with patient and caregiver  12/04/2024 0341 by Geroge Eleanor LABOR, RN  Outcome: Progressing     Problem: Skin/Tissue Integrity  Goal: Skin integrity remains intact  Description: 1.  Monitor for areas of redness and/or skin breakdown  2.  Assess vascular access sites hourly  3.  Every 4-6 hours minimum:  Change oxygen saturation probe site  4.  Every 4-6 hours:  If on nasal continuous positive airway pressure, respiratory therapy assess nares and determine need for appliance change or resting period  12/04/2024 1420 by Germaine Remak, RN  Outcome: Progressing  Flowsheets (Taken 12/04/2024 1123)  Skin Integrity Remains Intact: Monitor for areas of redness and/or skin breakdown  12/04/2024 0341 by Geroge Eleanor LABOR, RN  Outcome: Progressing     Problem: Safety - Adult  Goal: Free from  fall injury  Description: Standard safety precautions in place.   12/04/2024 1420 by Germaine Remak, RN  Outcome: Progressing  12/04/2024 0341 by Geroge Eleanor LABOR, RN  Outcome: Progressing     Problem: ABCDS Injury Assessment  Goal: Absence of physical injury  12/04/2024 1420 by Germaine Remak, RN  Outcome: Progressing  12/04/2024 0341 by Geroge Eleanor LABOR, RN  Outcome: Progressing  Flowsheets (Taken 12/04/2024 0341)  Absence of Physical Injury: Implement safety measures based on patient assessment     Problem: Pain  Goal: Verbalizes/displays adequate comfort level or baseline comfort level  Description: Monitor pain using advanced dementia scale.   12/04/2024 1420 by Germaine Remak, RN  Outcome: Progressing  12/04/2024 0341 by Geroge Eleanor LABOR, RN  Outcome: Progressing     "

## 2024-12-04 NOTE — Progress Notes (Signed)
 "[facility] - Progress Note    Admit Date: 12/02/2024  Attending Cardiologist: Dr. Rea     Assessment:     - Possible CVA vs TIA. CTA head/neck: severe stenosis of L PCA. No LVO. Neurology following.   - Persistent AF with RVR. On Eliquis .   14d holter 04/04/24 - 100% AF, average HR 75 bpm, no symptoms   - History of SSS s/p single chamber Abbott in 09/2012  - Echo 04/04/24 EF 53%  - Hypertension, on lopressor . Currently allowing for permissive HTN  - Dementia   - Hyperlipidemia     Primary cardiologist: [facility]    Plan:     Addendum: Independently seen and evaluated.  Agree with below.  Patient was unresponsive to verbal questioning.  Her daughter was present in the room.  With sudden change in her mental status, she may have had another CVA.  Will defer this to neurology.  Patient has persistent atrial fibrillation on Eliquis .  Would continue Eliquis  as tolerated if okay with neurology.  Continue other cardiac medication regimen.  No new cardiac recommendations at this time.  Will be available as needed.  Thank you.    - Tele reviewed, patient in rate controlled AF. Continue current cardiac medication regimen to include metoprolol  and verapamil .  - Echo yesterday showed EF 55-60%, indeterminate diastolic function due at AF, mild AS  - OAC with Eliquis    - CVA workup/management per primary team   - Will sign off and be available as needed     Subjective:     No new complaints.     Objective:      Patient Vitals for the past 8 hrs:   Temp Pulse Resp BP SpO2   12/04/24 1123 97.5 F (36.4 C) 85 19 (!) 149/80 92 %   12/04/24 0803 97.3 F (36.3 C) 61 18 (!) 145/89 94 %   12/04/24 0515 -- -- -- (!) 177/110 --   12/04/24 0500 98.4 F (36.9 C) 65 16 (!) 175/115 95 %         Patient Vitals for the past 96 hrs:   Weight   12/03/24 1500 79.8 kg (176 lb)   12/02/24 1401 79.8 kg (176 lb)       TELE: AFIB               Current Facility-Administered Medications   Medication Dose Route Frequency     prochlorperazine  (COMPAZINE ) injection 5 mg  5 mg IntraVENous Q6H PRN    0.9 % sodium chloride  infusion   IntraVENous Continuous    sodium chloride  flush 0.9 % injection 5-40 mL  5-40 mL IntraVENous 2 times per day    sodium chloride  flush 0.9 % injection 5-40 mL  5-40 mL IntraVENous PRN    0.9 % sodium chloride  infusion   IntraVENous PRN    potassium chloride  (KLOR-CON  M) extended release tablet 40 mEq  40 mEq Oral PRN    Or    potassium bicarb-citric acid  (EFFER-K) effervescent tablet 40 mEq  40 mEq Oral PRN    Or    potassium chloride  10 mEq/100 mL IVPB (Peripheral Line)  10 mEq IntraVENous PRN    magnesium  sulfate 2000 mg in 50 mL IVPB premix  2,000 mg IntraVENous PRN    acetaminophen  (TYLENOL ) tablet 650 mg  650 mg Oral Q4H PRN    Or    acetaminophen  (TYLENOL ) suppository 650 mg  650 mg Rectal Q4H PRN    ondansetron  (ZOFRAN -ODT) disintegrating tablet  4 mg  4 mg Oral Q8H PRN    Or    ondansetron  (ZOFRAN ) injection 4 mg  4 mg IntraVENous Q6H PRN    polyethylene glycol (GLYCOLAX ) packet 17 g  17 g Oral Daily PRN    atorvastatin  (LIPITOR ) tablet 80 mg  80 mg Oral Nightly    labetalol  (NORMODYNE ;TRANDATE ) injection 10 mg  10 mg IntraVENous Q10 Min PRN    donepezil  (ARICEPT ) tablet 10 mg  10 mg Oral Daily    metoprolol  tartrate (LOPRESSOR ) tablet 100 mg  100 mg Oral BID    verapamil  (CALAN  SR) extended release tablet 240 mg  240 mg Oral Daily    memantine  (NAMENDA ) tablet 5 mg  5 mg Oral BID    apixaban  (ELIQUIS ) tablet 5 mg  5 mg Oral BID    calcium  carbonate tablet 600 mg  1 tablet Oral Daily    Vitamin D  (CHOLECALCIFEROL ) tablet 2,000 Units  2,000 Units Oral Daily    vitamin B-12 (CYANOCOBALAMIN ) tablet 1,000 mcg  1,000 mcg Oral Daily    Polyvinyl Alcohol -Povidone PF (REFRESH) 1.4-0.6 % ophthalmic solution 2 drop  2 drop Both Eyes BID    meclizine  (ANTIVERT ) tablet 25 mg  25 mg Oral Q6H PRN    vitamin E  capsule 400 Units  400 Units Oral Daily         Intake/Output Summary (Last 24 hours) at 12/04/2024 1211  Last  data filed at 12/04/2024 0906  Gross per 24 hour   Intake 1313.42 ml   Output --   Net 1313.42 ml       Physical Exam:  General:  appears stated age  Neck:  no JVD  Lungs:  clear to auscultation bilaterally  Heart:  irregularly irregular rhythm  Abdomen:  abdomen is soft without significant tenderness, masses, organomegaly or guarding  Extremities:  extremities normal, atraumatic, no cyanosis or edema    Visit Vitals  BP (!) 149/80   Pulse 85   Temp 97.5 F (36.4 C) (Axillary)   Resp 19   Ht 1.651 m (5' 5)   Wt 79.8 kg (176 lb)   SpO2 92%   BMI 29.29 kg/m       Data Review:     Labs: Results:       Chemistry Recent Labs     12/02/24  1339 12/03/24  0531 12/04/24  0132   NA 141 140 138   K 3.9 3.8 3.5   CL 103 102 105   CO2 28 24 25    BUN 12 13 14    MG 1.8  --   --    GLOB 3.1  --   --       CBC w/Diff Recent Labs     12/02/24  1339 12/03/24  0531 12/04/24  0132   WBC 7.1 11.5 11.2   RBC 4.99 5.20 4.63   HGB 15.5 15.8 14.3   HCT 46.6* 48.2* 43.4   PLT 248 249 236      Cardiac Enzymes No results found for: TROPHS   Coagulation Recent Labs     12/02/24  1339   INR 1.3*       Lipid Panel Lab Results   Component Value Date/Time    CHOL 185 12/03/2024 05:31 AM    HDL 56 12/03/2024 05:31 AM    LDL 104 12/03/2024 05:31 AM    LDL 86.6 03/26/2023 10:48 AM    VLDL 25 12/03/2024 05:31 AM      BNP No results  found for: BNP, BNPPOC    No results found for: BNP   Liver Enzymes Lab Results   Component Value Date    ALT 13 12/02/2024    AST 16 12/02/2024    ALKPHOS 48 12/02/2024    BILITOT 0.5 12/02/2024      Thyroid Studies Lab Results   Component Value Date/Time    TSH 1.460 12/02/2024 01:39 PM          Signed By: Jordan L Cox, ACNP     December 04, 2024       "

## 2024-12-04 NOTE — Progress Notes (Signed)
"  [facility]  [facility]                  Room # 413/01    Name: Emily Henson           Age: 89 y.o.    Gender: female          MRN: [id number]  Religion: Baptist       Preferred Language: English    Date: 12/04/24  Visit Time: Begin Time: 1730 End Time : 1738 Complexity of Encounter: Moderate      Visit Summary: Patient was in bed and awake/resting. Family were at bedside. Chaplain introduced self. . Family expressed comfort in faith. Patient is a Curator. Chaplain provided prayer, support and checked for unmet needs.     Referral/Consult From: Multi-disciplinary team  Encounter Overview/Reason: Initial Encounter  Encounter Code: Encounter Code: Q9001 Assessment by chaplain services   Crisis (if applicable):     Service Provided For: Patient     Patient was available.    Faith, Belief, Meaning:   Patient is connected with a faith tradition or spiritual practice  has beliefs or practices that help with coping during difficult times  Family/Friends have beliefs or practices that help with coping during difficult times  faith/ spirituality is a source of strength  Rituals (if applicable)      Importance and Influence:  Patient has spiritual/personal beliefs that influence decisions regarding their health  Family/Friends has spiritual/personal beliefs that influence decisions regarding the patient's health    Community:  Patient   indicated that they feel well-supported  Family/Friends   indicated that they feel well-supported    Assessment and Plan of Care:   Emotions Expressed by Patient:   Assessment: Calm    Interventions by Chaplain:   Intervention: Active listening, Nurtured Hope, Prayer (assurance of)/Blessing     Result/ Response by Patient:   Outcome: Acceptance, Encouraged    Patient Plan of Care:   Plan and Referrals  Plan/Referrals: Continue to visit, (comment)     Emotions Expressed by Spouse/Family/Friends:   calm    Chaplain Interventions with Spouse/ Family/Friends include:   active  listening  prayer  provided ministry of presence    Spouse/Family/Friends Plan of Care:   Spiritual care available upon referral.        Electronically signed by Cherene Rake, [facility] on 12/04/2024 at 5:52 PM  "

## 2024-12-04 NOTE — Care Coordination (Signed)
"  Freedom of choice list provided to daughter.     Agreeable to referral being sent.     Pending choice.     Rolin Hummer, MSW   Inpatient Case Management (ICM)   [facility]    "

## 2024-12-04 NOTE — Progress Notes (Signed)
"  Progress Note  Hospitalist Service    Patient: Emily Henson MRN: [id number]   SSN: [id number]  Date of Birth: 04-Jan-1933   Age: 89 y.o.  Sex: female      Admit Date: 12/02/2024    LOS: 2 days   Chief Complaint   Patient presents with    Extremity Weakness       Subjective:     Patient seen and examined.  Patient with advanced dementia.  I have updated daughter, [name] over the phone to let her know that Ms. Shanitra has had a CVA.   Daughter interested in SNF.     Objective:     Vitals:  BP (!) 153/100   Pulse 66   Temp 97.3 F (36.3 C) (Axillary)   Resp 19   Ht 1.651 m (5' 5)   Wt 79.8 kg (176 lb)   SpO2 91%   BMI 29.29 kg/m     Physical Exam:   General appearance: appears stated age, cooperative, and fatigued  Lungs: clear to auscultation bilaterally  Heart: regular rate and rhythm, S1, S2 normal, no murmur, click, rub or gallop  Abdomen: soft, non-tender; bowel sounds normal; no masses,  no organomegaly  Pulses: 2+ and symmetric  Skin: Skin color, texture, turgor normal. No rashes or lesions  Neuro:  Sleepy (difficult to arouse). Moving all extremities.     Intake and Output:  Current Shift: 12/18 0701 - 12/18 1900  In: 1711.7 [P.O.:120; I.V.:1591.7]  Out: -   Last three shifts: 12/16 1901 - 12/18 0700  In: 120 [P.O.:120]  Out: -     Lab/Data Review:  No results found for this or any previous visit (from the past 12 hours).        Key Findings or tests:       Telemetry NONE   Oxygen NONE     Assessment and Plan:     #Slurred speech  #CVA  - acute cerebellar   #Nausea and vomiting   - Atorvastatin  + home med Eliquis   - Restarted home antihypertensives   - IV fluids w/ LR at rate of 100 cc/h  - CTA head/neck: severe stenosis of L PCA. No LVO.  - CT head: moderate microvascular ischemic changes     #Persistent Afib RVR, on Eliquis   #S/P single chamber pacemaker placement 09/25/12  - Concern of cardioembolic stroke, echo completed   - Cardio consult pending, recommendations appreciated  - Echo 04/04/24 - EF  53%, visually mod enlarged LA  - 14d holter 04/04/24 - 100% AF, average HR 75 bpm, no symptoms     #HTN  - Home med Lopressor   Hold in setting of stroke R/O for permissive HTN     #Alzheimer's dementia  - Home meds Donepezil  + Memantine   - Qtc 414 ms, ok to continue home meds     #HLD  #CAD  - Home med Fenofibrate   - High dose statin per stroke tx protocol    Nausea  -PRN compazine  and zofran  on board.       Diet    DVT Prophylax    GI Prophylaxis    Code status    Disposition Stable for DC. Needs SNF placement         Zane LOISE Hurst, DO, hospitalist   December 04, 2024        "

## 2024-12-04 NOTE — Plan of Care (Signed)
"    Problem: Chronic Conditions and Co-morbidities  Goal: Patient's chronic conditions and co-morbidity symptoms are monitored and maintained or improved  Outcome: Progressing  Flowsheets (Taken 12/04/2024 0341)  Care Plan - Patient's Chronic Conditions and Co-Morbidity Symptoms are Monitored and Maintained or Improved: Monitor and assess patient's chronic conditions and comorbid symptoms for stability, deterioration, or improvement     Problem: Discharge Planning  Goal: Discharge to home or other facility with appropriate resources  Description: Discharge home or to rehab when medically ready  Outcome: Progressing     Problem: Skin/Tissue Integrity  Goal: Skin integrity remains intact  Description: 1.  Monitor for areas of redness and/or skin breakdown  2.  Assess vascular access sites hourly  3.  Every 4-6 hours minimum:  Change oxygen saturation probe site  4.  Every 4-6 hours:  If on nasal continuous positive airway pressure, respiratory therapy assess nares and determine need for appliance change or resting period  Outcome: Progressing     Problem: Safety - Adult  Goal: Free from fall injury  Description: Standard safety precautions in place.   Outcome: Progressing     Problem: ABCDS Injury Assessment  Goal: Absence of physical injury  Outcome: Progressing  Flowsheets (Taken 12/04/2024 0341)  Absence of Physical Injury: Implement safety measures based on patient assessment     Problem: Pain  Goal: Verbalizes/displays adequate comfort level or baseline comfort level  Description: Monitor pain using advanced dementia scale.   Outcome: Progressing     "

## 2024-12-05 LAB — CBC
Hematocrit: 44.2 % (ref 35.0–45.0)
Hemoglobin: 14.6 g/dL (ref 12.0–16.0)
MCH: 30.9 pg (ref 24.0–34.0)
MCHC: 33 g/dL (ref 31.0–37.0)
MCV: 93.6 FL (ref 78.0–100.0)
MPV: 10.9 FL (ref 9.2–11.8)
Nucleated RBCs: 0 /100{WBCs}
Platelets: 237 K/uL (ref 135–420)
RBC: 4.72 M/uL (ref 4.20–5.30)
RDW: 13.2 % (ref 11.6–14.5)
WBC: 9.4 K/uL (ref 4.6–13.2)
nRBC: 0 K/uL (ref 0.00–0.01)

## 2024-12-05 LAB — BASIC METABOLIC PANEL
Anion Gap: 9 mmol/L (ref 3.0–18.0)
BUN/Creatinine Ratio: 16 (ref 12–20)
BUN: 10 mg/dL (ref 6–23)
CO2: 26 mmol/L (ref 21–32)
Calcium: 10.3 mg/dL — ABNORMAL HIGH (ref 8.6–10.2)
Chloride: 105 mmol/L (ref 98–107)
Creatinine: 0.63 mg/dL (ref 0.6–1.3)
Est, Glom Filt Rate: 85 ml/min/1.73m2 (ref >60)
Glucose: 85 mg/dL (ref 74–108)
Potassium: 3.6 mmol/L (ref 3.5–5.5)
Sodium: 139 mmol/L (ref 136–145)

## 2024-12-05 MED FILL — VITAMIN B-12 1000 MCG PO TABS: 1000 ug | ORAL | Qty: 1 | Fill #0

## 2024-12-05 MED FILL — DONEPEZIL HCL 5 MG PO TABS: 5 mg | ORAL | Qty: 2 | Fill #0

## 2024-12-05 MED FILL — METOPROLOL TARTRATE 50 MG PO TABS: 50 mg | ORAL | Qty: 2 | Fill #0

## 2024-12-05 MED FILL — VITAMIN D (CHOLECALCIFEROL) 25 MCG (1000 UT) PO TABS: 25 MCG (1000 UT) | ORAL | Qty: 2 | Fill #0

## 2024-12-05 MED FILL — CALCIUM CARBONATE 1500 (600 CA) MG PO TABS: 1500 (600 Ca) MG | ORAL | Qty: 1 | Fill #0

## 2024-12-05 MED FILL — ELIQUIS 5 MG PO TABS: 5 mg | ORAL | Qty: 1 | Fill #0

## 2024-12-05 MED FILL — VERAPAMIL HCL ER 240 MG PO TBCR: 240 mg | ORAL | Qty: 1 | Fill #0

## 2024-12-05 MED FILL — MEMANTINE HCL 5 MG PO TABS: 5 mg | ORAL | Qty: 1 | Fill #0

## 2024-12-05 NOTE — Plan of Care (Signed)
 "  Problem: Chronic Conditions and Co-morbidities  Goal: Patient's chronic conditions and co-morbidity symptoms are monitored and maintained or improved  12/05/2024 1325 by Germaine Remak, RN  Outcome: Progressing  Flowsheets (Taken 12/05/2024 1223)  Care Plan - Patient's Chronic Conditions and Co-Morbidity Symptoms are Monitored and Maintained or Improved: Monitor and assess patient's chronic conditions and comorbid symptoms for stability, deterioration, or improvement  12/05/2024 0019 by Geroge Eleanor LABOR, RN  Outcome: Progressing     Problem: Discharge Planning  Goal: Discharge to home or other facility with appropriate resources  Description: Discharge home or to rehab when medically ready  12/05/2024 1325 by Germaine Remak, RN  Outcome: Progressing  Flowsheets (Taken 12/05/2024 1223)  Discharge to home or other facility with appropriate resources: Identify barriers to discharge with patient and caregiver  12/05/2024 0019 by Geroge Eleanor LABOR, RN  Outcome: Progressing     Problem: Skin/Tissue Integrity  Goal: Skin integrity remains intact  Description: 1.  Monitor for areas of redness and/or skin breakdown  2.  Assess vascular access sites hourly  3.  Every 4-6 hours minimum:  Change oxygen saturation probe site  4.  Every 4-6 hours:  If on nasal continuous positive airway pressure, respiratory therapy assess nares and determine need for appliance change or resting period  12/05/2024 1325 by Germaine Remak, RN  Outcome: Progressing  Flowsheets (Taken 12/05/2024 1223)  Skin Integrity Remains Intact:   Monitor for areas of redness and/or skin breakdown   Assess vascular access sites hourly  12/05/2024 0019 by Geroge Eleanor LABOR, RN  Outcome: Progressing     Problem: Safety - Adult  Goal: Free from fall injury  Description: Standard safety precautions in place.   12/05/2024 1325 by Germaine Remak, RN  Outcome: Progressing  12/05/2024 0019 by Geroge Eleanor LABOR, RN  Outcome: Progressing     Problem: ABCDS Injury  Assessment  Goal: Absence of physical injury  12/05/2024 1325 by Germaine Remak, RN  Outcome: Progressing  12/05/2024 0019 by Geroge Eleanor LABOR, RN  Outcome: Progressing     Problem: Physical Therapy - Adult  Goal: By Discharge: Performs mobility at highest level of function for planned discharge setting.  See evaluation for individualized goals.  Description: Physical Therapy Goals:  Initiated 12/03/2024 to be met within 7-10 days.    1.  Patient will move from supine to sit and sit to supine , scoot up and down, and roll side to side in bed with moderate assistance.    2.  Patient will transfer from bed to chair and chair to bed with moderate assistance using the least restrictive device.  3.  Patient will perform sit to stand with moderate assistance.  4.  Patient will ambulate with moderate assistance for 10 feet with the least restrictive device.   5.  Patient will ascend/descend 4 stairs with b/l handrail(s) with moderate assistance.    PLOF: Provided by pt's Daughter at bedside.  Pt lives with her Daughter, did have a caretaker to be with her when she wasn't there but no longer working with her, in a single story home with 4 STE with no handrails.  Pt was ambulating short distances in home with Min/ModA from family utilizing no AD except her blind cane/white cane and required Min/ModA for bed mobility as well.  Pt spending more time in bed/recliner as of recent.    12/05/2024 1233 by Jennings Gauze, PT  Outcome: Progressing     Problem: Occupational Therapy - Adult  Goal: By Discharge:  Performs self-care activities at highest level of function for planned discharge setting.  See evaluation for individualized goals.  Description: Occupational Therapy Goals:  Initiated 12/03/2024 to be met within 7-10 days.    1.  Patient will perform self-feeding with minimal assistance.   2.  Patient will perform grooming with minimal assistance.  3.  Patient will perform upper body dressing  with minimal assistance.  4.   Patient will perform toilet transfers with moderate assistance.  5.  Patient will perform all aspects of toileting with moderate assistance.  6.  Patient will participate in upper extremity therapeutic exercise/activities with minimal assistance for 8-10 minutes to increase strength/endurance for ADLs.    7.  Patient will utilize energy conservation techniques during functional activities with verbal and tactile cues.    PLOF: Provided by pt's Daughter at bedside.  Pt lives with her Daughter, did have a caretaker to be with her when she wasn't there but no longer working with her, in a single story home with 4 STE with no handrails.  Pt was ambulating short distances in home with Min/ModA from family utilizing no AD except her blind cane/white cane and required Min/ModA for bed mobility as well. Requires assist for dressing, however refuses assist for bathing. Questionable incontinence at home. Pt spending more time in bed/recliner as of recent.        12/05/2024 1323 by Jeronimo Nest L, OTA  Outcome: Progressing     Problem: Pain  Goal: Verbalizes/displays adequate comfort level or baseline comfort level  Description: Monitor pain using advanced dementia scale.   12/05/2024 1325 by Germaine Remak, RN  Outcome: Progressing  12/05/2024 0019 by Geroge Eleanor LABOR, RN  Outcome: Progressing     "

## 2024-12-05 NOTE — Plan of Care (Signed)
 "  Problem: Occupational Therapy - Adult  Goal: By Discharge: Performs self-care activities at highest level of function for planned discharge setting.  See evaluation for individualized goals.  Description: Occupational Therapy Goals:  Initiated 12/03/2024 to be met within 7-10 days.    1.  Patient will perform self-feeding with minimal assistance.   2.  Patient will perform grooming with minimal assistance.  3.  Patient will perform upper body dressing  with minimal assistance.  4.  Patient will perform toilet transfers with moderate assistance.  5.  Patient will perform all aspects of toileting with moderate assistance.  6.  Patient will participate in upper extremity therapeutic exercise/activities with minimal assistance for 8-10 minutes to increase strength/endurance for ADLs.    7.  Patient will utilize energy conservation techniques during functional activities with verbal and tactile cues.    PLOF: Provided by pt's Daughter at bedside.  Pt lives with her Daughter, did have a caretaker to be with her when she wasn't there but no longer working with her, in a single story home with 4 STE with no handrails.  Pt was ambulating short distances in home with Min/ModA from family utilizing no AD except her blind cane/white cane and required Min/ModA for bed mobility as well. Requires assist for dressing, however refuses assist for bathing. Questionable incontinence at home. Pt spending more time in bed/recliner as of recent.        Outcome: Progressing       OCCUPATIONAL THERAPY TREATMENT    Patient: Emily Henson (89 y.o. female)  Date: 12/05/2024  Diagnosis: Hypercalcemia [E83.52]  Acute coronary syndrome (HCC) [I24.9]  Chronic atrial fibrillation (HCC) [I48.20]  Acute CVA (cerebrovascular accident) (HCC) [I63.9]  Longstanding persistent atrial fibrillation (HCC) [I48.11]  Cerebrovascular accident (CVA), unspecified mechanism (HCC) [I63.9] Cerebrovascular accident (CVA) (HCC)      Precautions: Fall Risk, General  Precautions, Other (Comment) (blind),  ,  ,  ,  ,  ,  ,      Chart, occupational therapy assessment, plan of care, and goals were reviewed.  ASSESSMENT:  Pt presented supine in bed upon entry, daughter present, and agreeable. Pt with increased interaction with therapist this date. Daughter reports pt is still very confused and required heavy VC's and tactile cueing throughout session due to visual deficits. She was was assisted to EOB MAX A in prep for functional tasks. Once sitting, pt demo F balance requiring  B UE support in b/l SR's to maintain balance. STS transfer MOD A X 2 with HHA. Pt reports slight dizziness with history of having vertigo providing extra time for transition. She performed SPT to reclining chair MOD A X 2, simulating BSC transfer. Pt was positioned for comfort in chair and left with all needs within reach. Daughter present and RN made aware.   Progression toward goals:  []           Improving appropriately and progressing toward goals  [x]           Improving slowly and progressing toward goals  []           Not making progress toward goals and plan of care will be adjusted     PLAN:  Patient continues to benefit from skilled intervention to address the above impairments.  Continue treatment per established plan of care.    Further Equipment Recommendations for Discharge: hospital bed and [facility]    AMPAC: AM-PAC Inpatient Daily Activity Raw Score: 9    Current research shows that an AM-PAC  score of 17 or less is not associated with a discharge to the patient's home setting.    This AMPAC score should be considered in conjunction with interdisciplinary team recommendations to determine the most appropriate discharge setting. Patient's social support, diagnosis, medical stability, and prior level of function should also be taken into consideration.     SUBJECTIVE:   Patient stated, I will sit up.    OBJECTIVE DATA SUMMARY:   Cognitive/Behavioral Status:  Orientation  Overall Orientation Status:  Impaired  Orientation Level: Oriented to person  Cognition  Overall Cognitive Status: Exceptions  Arousal/Alertness: Appears intact  Following Commands: Follows one step commands with increased time;Follows one step commands consistently  Attention Span: Appears intact  Memory: Impaired  Safety Judgement: Impaired  Problem Solving: Impaired  Insights: Decreased awareness of deficits  Initiation: Requires cues for all  Sequencing: Requires cues for all    Functional Mobility and Transfers for ADLs:   Bed Mobility:  Bed Mobility Training  Bed Mobility Training: Yes  Supine to Sit: Substantial/Maximal assistance  Scooting: Partial/Moderate assistance   Transfers:  Art Therapist: Yes  Sit to Stand: Partial/Moderate assistance;2 Person assistance  Stand to Sit: Partial/Moderate assistance;2 Person assistance  Stand Pivot Transfers: Partial/Moderate assistance;2 Person assistance    Balance:  Balance  Sitting: Impaired  Sitting - Static: Fair (occasional)  Sitting - Dynamic: Fair (occasional)  Standing: Impaired  Standing - Static: Fair  Standing - Dynamic: Poor    Pain:  Intensity Pre-treatment: 0/10   Intensity Post-treatment: 0/10    Activity Tolerance:    Activity Tolerance: Patient tolerated treatment well  Please refer to the flowsheet for vital signs taken during this treatment.  After treatment:   [x]   Patient left in no apparent distress sitting up in chair  []   Patient left in no apparent distress in bed  [x]   Call bell left within reach  [x]   Nursing notified  [x]   Caregiver present  [x]   Bed/chair alarm activated  []   No alarm    COMMUNICATION/EDUCATION:   Patient Education  Education Given To: Patient;Family  Education Provided: Plan of Care;Role of Therapy;ADL Adaptive Strategies;Transfer Training;Mobility Training  Education Method: Verbal;Teach Back  Barriers to Learning: Cognition;Vision  Education Outcome: Continued education needed      Thank you for this referral.  Delon CROME  Seeley Southgate, OTA  Minutes: 23  "

## 2024-12-05 NOTE — Care Coordination (Signed)
"  Pre-cert Request   12/05/2024, 6:43 PM    Patient Name: Emily Henson                   Date of Birth: 1933-05-26      *ALERT - Authorization is pending review by the insurance company.  This is not an authorization.*    Submitted via [employer].  Requested Facility:  [facility]  Anticipated Admit Date to SNF:  12/06/2024  Pending Auth #:  [id number]   Pending Plan Auth ID:     [facility]  Case Management Department  Ph: [phone]   Fax: [phone]   "

## 2024-12-05 NOTE — Care Coordination (Signed)
"  SNF Authorization  12/05/2024, 6:45 PM    Patient Name: Emily Henson                   Date of Birth: 03/07/1933      Received via: Merilynn Barrows ID:  [id number]   Plan Auth ID:   Service:  SNF [facility]  Approval Dates: 12/06/2024 through 12/12/2024  Next Review Date: 12/12/2024        [facility]  Case Management Department  Ph: [phone]  Fax: [phone]   "

## 2024-12-05 NOTE — Care Coordination (Signed)
"  Family interested in [facility] or [facility].     Natasha checking     MSW awaiting confirmation for auth to be requested.     Rolin Hummer, MSW   Inpatient Case Management (ICM)   [facility]    "

## 2024-12-05 NOTE — Progress Notes (Signed)
"  Progress Note  Hospitalist Service    Patient: Emily Henson MRN: [id number]   SSN: [id number]  Date of Birth: 11/03/1933   Age: 89 y.o.  Sex: female      Admit Date: 12/02/2024    LOS: 3 days   Chief Complaint   Patient presents with    Extremity Weakness       Subjective:     Patient seen and examined.  Patient with advanced dementia.  Emily Henson, daughter updated at bedside.   They are awaiting auth.     Objective:     Vitals:  BP (!) 147/80   Pulse 60   Temp 97.3 F (36.3 C) (Axillary)   Resp 20   Ht 1.651 m (5' 5)   Wt 79.8 kg (176 lb)   SpO2 97%   BMI 29.29 kg/m     Physical Exam:   General appearance: appears stated age, cooperative, and fatigued  Lungs: clear to auscultation bilaterally  Heart: regular rate and rhythm, S1, S2 normal, no murmur, click, rub or gallop  Abdomen: soft, non-tender; bowel sounds normal; no masses,  no organomegaly  Pulses: 2+ and symmetric  Skin: Skin color, texture, turgor normal. No rashes or lesions  Neuro:  Sleepy (difficult to arouse). Moving all extremities.     Intake and Output:  Current Shift: 12/19 0701 - 12/19 1900  In: -   Out: 1300 [Urine:1300]  Last three shifts: 12/17 1901 - 12/19 0700  In: 1951.7 [P.O.:360; I.V.:1591.7]  Out: 1200 [Urine:1200]    Lab/Data Review:  No results found for this or any previous visit (from the past 12 hours).        Key Findings or tests:       Telemetry NONE   Oxygen NONE     Assessment and Plan:     #Slurred speech  #CVA  - acute cerebellar   #Nausea and vomiting   - Atorvastatin  + home med Eliquis   - Restarted home antihypertensives   - IV fluids w/ LR at rate of 100 cc/h can now be discontinued.   - CTA head/neck: severe stenosis of L PCA. No LVO.  - CT head: moderate microvascular ischemic changes     #Persistent Afib RVR, on Eliquis   #S/P single chamber pacemaker placement 09/25/12  - Concern of cardioembolic stroke, echo completed   - Cardio consult pending, recommendations appreciated  - Echo 04/04/24 - EF 53%, visually mod  enlarged LA  - 14d holter 04/04/24 - 100% AF, average HR 75 bpm, no symptoms     #HTN  - Home med Lopressor   continued.      #Alzheimer's dementia  - Home meds Donepezil  + Memantine   - Qtc 414 ms, ok to continue home meds     #HLD  #CAD  - Home med Fenofibrate   - High dose statin per stroke tx protocol    Nausea  -PRN compazine  and zofran  on board.       Diet    DVT Prophylax    GI Prophylaxis    Code status    Disposition Stable for DC. Needs SNF placement         Emily LOISE Hurst, DO, hospitalist   December 05, 2024        "

## 2024-12-05 NOTE — Plan of Care (Signed)
 "  Problem: Physical Therapy - Adult  Goal: By Discharge: Performs mobility at highest level of function for planned discharge setting.  See evaluation for individualized goals.  Description: Physical Therapy Goals:  Initiated 12/03/2024 to be met within 7-10 days.    1.  Patient will move from supine to sit and sit to supine , scoot up and down, and roll side to side in bed with moderate assistance.    2.  Patient will transfer from bed to chair and chair to bed with moderate assistance using the least restrictive device.  3.  Patient will perform sit to stand with moderate assistance.  4.  Patient will ambulate with moderate assistance for 10 feet with the least restrictive device.   5.  Patient will ascend/descend 4 stairs with b/l handrail(s) with moderate assistance.    PLOF: Provided by pt's Daughter at bedside.  Pt lives with her Daughter, did have a caretaker to be with her when she wasn't there but no longer working with her, in a single story home with 4 STE with no handrails.  Pt was ambulating short distances in home with Min/ModA from family utilizing no AD except her blind cane/white cane and required Min/ModA for bed mobility as well.  Pt spending more time in bed/recliner as of recent.    Outcome: Progressing     Recommendations for interdisciplinary mobility: up with assist x 2 for SPT to BSC/recliner    PHYSICAL THERAPY TREATMENT    Patient: Emily Henson (89 y.o. female)  Date: 12/05/2024  Diagnosis: Hypercalcemia [E83.52]  Acute coronary syndrome (HCC) [I24.9]  Chronic atrial fibrillation (HCC) [I48.20]  Acute CVA (cerebrovascular accident) (HCC) [I63.9]  Longstanding persistent atrial fibrillation (HCC) [I48.11]  Cerebrovascular accident (CVA), unspecified mechanism (HCC) [I63.9] Cerebrovascular accident (CVA) (HCC)      Precautions: Fall Risk, General Precautions, Other (Comment) (blind),  ,  ,  ,  ,  ,  ,      ASSESSMENT:  Pt resting in bed upon entering room, agreeable to PT treatment session  with supportive Daughter at bedside.  Pt appeared brighter and more alert this session, per Daughter she is still very confused but more communicative.  Pt is A&OX1 to person, pleasantly confused throughout session requiring hand over hand assistance for transfers and mobility due to being blind.  Supine to sit transfer with MaxA, demonstrates fair sitting balance with use of b/l UEs on bed rails for assistance.  STS with ModAx2 with fair standing balance, pt notes fear of falling and feeling unsteady.  Pt with a hx of vertigo, noting dizziness with transitions requiring increased time to adjust.  Pt agreeable to sit up in recliner, required ModAx2 for SPT, positioned comfortably and left with all needs met and Daughter present in room agreeing to supervise pt to ensure she is not attempting to get up without assistance.  RN updated.       Progression toward goals:   []       Improving appropriately and progressing toward goals  [x]       Improving slowly and progressing toward goals  []       Not making progress toward goals and plan of care will be adjusted     PLAN:  Patient continues to benefit from skilled intervention to address the above impairments.  Continue treatment per established plan of care.    Further Equipment Recommendations for Discharge: hospital bed, wheelchair 18 in    AMPAC: AM-PAC Inpatient Mobility Raw Score : 11  Current research shows that an AM-PAC score of 17 (13 without stairs) or less is not associated with a discharge to the patient's home setting.    This AMPAC score should be considered in conjunction with interdisciplinary team recommendations to determine the most appropriate discharge setting. Patient's social support, diagnosis, medical stability, and prior level of function should also be taken into consideration.     SUBJECTIVE:   Patient stated, I'm doing ok.    OBJECTIVE DATA SUMMARY:   Critical Behavior:  Orientation  Overall Orientation Status: Impaired  Orientation  Level: Oriented to person  Cognition  Overall Cognitive Status: Exceptions  Arousal/Alertness: Appears intact  Following Commands: Follows one step commands with increased time;Follows one step commands consistently  Attention Span: Appears intact  Memory: Impaired  Safety Judgement: Impaired  Problem Solving: Impaired  Insights: Decreased awareness of deficits  Initiation: Requires cues for all  Sequencing: Requires cues for all    Functional Mobility Training:  Bed Mobility:  Bed Mobility Training  Bed Mobility Training: Yes  Supine to Sit: Substantial/Maximal assistance  Scooting: Partial/Moderate assistance  Transfers:  Art Therapist: Yes  Sit to Stand: Partial/Moderate assistance;2 Person assistance  Stand to Sit: Partial/Moderate assistance;2 Person assistance  Stand Pivot Transfers: Partial/Moderate assistance;2 Person assistance  Balance:  Balance  Sitting: Impaired  Sitting - Static: Fair (occasional)  Sitting - Dynamic: Fair (occasional)  Standing: Impaired  Standing - Static: Fair  Standing - Dynamic: Poor     Ambulation/Gait Training:     Gait  Gait Training: No    Pain:  Intensity Pre-treatment: 0/10   Intensity Post-treatment: 0/10      Activity Tolerance:   Activity Tolerance: Patient tolerated treatment well  Please refer to the flowsheet for vital signs taken during this treatment.  After treatment:   [x]  Patient left in no apparent distress sitting up in chair  []  Patient left in no apparent distress in bed  [x]  Call bell left within reach  [x]  Nursing notified  []  Caregiver present  []  Bed/chair alarm activated  [x]  No alarmFamily in room and agree to monitor the patient   []  SCDs applied      COMMUNICATION/EDUCATION:   Patient Education  Education Given To: Patient  Education Provided: Role of Therapy;Plan of Art Therapist  Education Method: Verbal;Teach Back  Barriers to Learning: Cognition;Vision  Education Outcome: Verbalized  understanding;Demonstrated understanding;Continued education needed      [employer], PT  Minutes: 23   "

## 2024-12-05 NOTE — Care Coordination (Signed)
"  SNF Authorization Request  12/05/2024, 12:26 PM    Patient Name: Emily Henson                   Date of Birth: 1933-08-08    Patient has been provided with freedom of choice and has chosen to go to [facility] has confirmed that they can accept the patient and they have a bed Yes  SNF has confirmed that they are in network with the patient's insurance Yes    Please request authorization.    Estimated date of discharge is 12/19    Skilled Need    PT Yes   OT Yes   Speech therapy Yes   Wound Care No  IV MedicationsNo  Trach No   Peg No     If PT/ OT/ Speech is required, evaluations/ notes have been updated within the last 48 hours Yes       Additional information     Payor: Pay[place] / Plan[place] / Product Type: PPO /   Attending physician: Shona Zane LOISE ROSALEA Rolin Sebastian  Case Management Department  Ph: [phone]     "

## 2024-12-05 NOTE — Plan of Care (Signed)
"    Problem: Chronic Conditions and Co-morbidities  Goal: Patient's chronic conditions and co-morbidity symptoms are monitored and maintained or improved  12/05/2024 0019 by Geroge Eleanor LABOR, RN  Outcome: Progressing     Problem: Discharge Planning  Goal: Discharge to home or other facility with appropriate resources  Description: Discharge home or to rehab when medically ready  12/05/2024 0019 by Geroge Eleanor LABOR, RN  Outcome: Progressing     Problem: Skin/Tissue Integrity  Goal: Skin integrity remains intact  Description: 1.  Monitor for areas of redness and/or skin breakdown  2.  Assess vascular access sites hourly  3.  Every 4-6 hours minimum:  Change oxygen saturation probe site  4.  Every 4-6 hours:  If on nasal continuous positive airway pressure, respiratory therapy assess nares and determine need for appliance change or resting period  12/05/2024 0019 by Geroge Eleanor LABOR, RN  Outcome: Progressing     Problem: Safety - Adult  Goal: Free from fall injury  Description: Standard safety precautions in place.   12/05/2024 0019 by Geroge Eleanor LABOR, RN  Outcome: Progressing     Problem: ABCDS Injury Assessment  Goal: Absence of physical injury  12/05/2024 0019 by Geroge Eleanor LABOR, RN  Outcome: Progressing     Problem: Pain  Goal: Verbalizes/displays adequate comfort level or baseline comfort level  Description: Monitor pain using advanced dementia scale.   12/05/2024 0019 by Geroge Eleanor LABOR, RN  Outcome: Progressing     "

## 2024-12-06 LAB — CBC
Hematocrit: 44.6 % (ref 35.0–45.0)
Hemoglobin: 14.9 g/dL (ref 12.0–16.0)
MCH: 30.8 pg (ref 24.0–34.0)
MCHC: 33.4 g/dL (ref 31.0–37.0)
MCV: 92.3 FL (ref 78.0–100.0)
MPV: 10.9 FL (ref 9.2–11.8)
Nucleated RBCs: 0 /100{WBCs}
Platelets: 224 K/uL (ref 135–420)
RBC: 4.83 M/uL (ref 4.20–5.30)
RDW: 13 % (ref 11.6–14.5)
WBC: 10.1 K/uL (ref 4.6–13.2)
nRBC: 0 K/uL (ref 0.00–0.01)

## 2024-12-06 LAB — BASIC METABOLIC PANEL
Anion Gap: 10 mmol/L (ref 3.0–18.0)
BUN/Creatinine Ratio: 18 (ref 12–20)
BUN: 10 mg/dL (ref 6–23)
CO2: 24 mmol/L (ref 21–32)
Calcium: 10.3 mg/dL — ABNORMAL HIGH (ref 8.6–10.2)
Chloride: 104 mmol/L (ref 98–107)
Creatinine: 0.56 mg/dL — ABNORMAL LOW (ref 0.6–1.3)
Est, Glom Filt Rate: 87 ml/min/1.73m2 (ref >60)
Glucose: 111 mg/dL — ABNORMAL HIGH (ref 74–108)
Potassium: 3.4 mmol/L — ABNORMAL LOW (ref 3.5–5.5)
Sodium: 137 mmol/L (ref 136–145)

## 2024-12-06 MED ORDER — ATORVASTATIN CALCIUM 80 MG PO TABS
80 | ORAL_TABLET | Freq: Every evening | ORAL | 3 refills | 90.00000 days | Status: DC
Start: 2024-12-06 — End: 2024-12-16

## 2024-12-06 MED ORDER — MELATONIN 5 MG PO TBDP
5 | Freq: Every evening | ORAL | Status: DC
Start: 2024-12-06 — End: 2024-12-07
  Administered 2024-12-06 – 2024-12-07 (×2): 5 mg via ORAL

## 2024-12-06 MED ORDER — HALOPERIDOL 2 MG PO TABS
2 | Freq: Every evening | ORAL | Status: DC
Start: 2024-12-06 — End: 2024-12-07
  Administered 2024-12-07: 02:00:00 1 mg via ORAL

## 2024-12-06 MED ORDER — HALOPERIDOL LACTATE 5 MG/ML IJ SOLN
5 | Freq: Once | INTRAMUSCULAR | Status: AC
Start: 2024-12-06 — End: 2024-12-06
  Administered 2024-12-06: 07:00:00 2 mg via INTRAMUSCULAR

## 2024-12-06 MED ORDER — HALOPERIDOL 1 MG PO TABS
1 | ORAL_TABLET | Freq: Every evening | ORAL | 0 refills | 29.00000 days | Status: DC
Start: 2024-12-06 — End: 2024-12-06

## 2024-12-06 MED FILL — METOPROLOL TARTRATE 50 MG PO TABS: 50 mg | ORAL | Qty: 2 | Fill #0

## 2024-12-06 MED FILL — ATORVASTATIN CALCIUM 40 MG PO TABS: 40 mg | ORAL | Qty: 2 | Fill #0

## 2024-12-06 MED FILL — HALOPERIDOL LACTATE 5 MG/ML IJ SOLN: 5 mg/mL | INTRAMUSCULAR | Qty: 1 | Fill #0

## 2024-12-06 MED FILL — MELATONIN 5 MG PO TBDP: 5 mg | ORAL | Qty: 1 | Fill #0

## 2024-12-06 MED FILL — MEMANTINE HCL 5 MG PO TABS: 5 mg | ORAL | Qty: 1 | Fill #0

## 2024-12-06 MED FILL — REFRESH 1.4-0.6 % OP SOLN: 1.4-0.6 % | OPHTHALMIC | Qty: 1 | Fill #0

## 2024-12-06 MED FILL — ELIQUIS 5 MG PO TABS: 5 mg | ORAL | Qty: 1 | Fill #0

## 2024-12-06 NOTE — Discharge Instructions (Addendum)
 Patient armband removed and shredded

## 2024-12-06 NOTE — Progress Notes (Signed)
"    Physician Progress Note      PATIENT:               Emily Henson, Emily Henson  CSN #:                  [id number]  DOB:                       20-Jul-1933  ADMIT DATE:       12/02/2024 1:25 PM  DISCH DATE:        12/06/2024 12:12 AM  RESPONDING  PROVIDER #:        Zane LOISE Hurst DO          QUERY TEXT:    Based on your medical judgment, please clarify these findings and document if   any of the following are being evaluated and/or treated:    The clinical indicators include:  -past medical history of moderate Alzheimer's dementia, hyperglycemia,   primary osteoarthritis, permanent atrial fibrillation, gait instability,   essential hypertension-ED Provider Notes by Orval Baxter, DO at   12/02/2024    -Persistent Afib RVR, on Eliquis - H&P by Claudene Herring, PA at 12/02/2024    -Persistent Afib RVR, on Eliquis , 14d holter 04/04/24 - 100% AF, average HR 75   bpm, no symptoms-Progress Notes by Hurst Zane LOISE, DO at 12/03/2024     -INR PPP -1.3(lab results)  PT TIME PPP (sec)-16.4 (lab results)    -apixaban  (ELIQUIS ) tablet 5 mg, Cardiology consulted, monitoring    Thank you,  Penubolu Vamsi Keller Skiff [facility] CDS  Options provided:  -- Secondary hypercoagulable state in a patient with atrial fibrillation  -- Other - I will add my own diagnosis  -- Disagree - Not applicable / Not valid  -- Refer to Clinical Documentation Reviewer    PROVIDER RESPONSE TEXT:    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    Query created by: Keller Skiff, Penubolu Vamsi on 12/04/2024 8:18 AM      Electronically signed by:  Zane LOISE Hurst DO 12/07/2024 12:50 PM          "

## 2024-12-06 NOTE — Care Coordination (Signed)
"  Called and spoke with Mylinda [phone], liaison for [facility], pt can admit to [facility] today.    Spoke with the daughter Rosaline at the bedside, informed pt has been accepted and transportation is scheduled for 1800.    Valentin Fears BSN RN  Case Management  [phone]   "

## 2024-12-06 NOTE — Plan of Care (Signed)
"    Problem: Chronic Conditions and Co-morbidities  Goal: Patient's chronic conditions and co-morbidity symptoms are monitored and maintained or improved  Outcome: Progressing  Flowsheets (Taken 12/06/2024 0836)  Care Plan - Patient's Chronic Conditions and Co-Morbidity Symptoms are Monitored and Maintained or Improved: Monitor and assess patient's chronic conditions and comorbid symptoms for stability, deterioration, or improvement     Problem: Discharge Planning  Goal: Discharge to home or other facility with appropriate resources  Description: Discharge home or to rehab when medically ready  Outcome: Progressing  Flowsheets (Taken 12/06/2024 0836)  Discharge to home or other facility with appropriate resources: Identify barriers to discharge with patient and caregiver     Problem: Skin/Tissue Integrity  Goal: Skin integrity remains intact  Description: 1.  Monitor for areas of redness and/or skin breakdown  2.  Assess vascular access sites hourly  3.  Every 4-6 hours minimum:  Change oxygen saturation probe site  4.  Every 4-6 hours:  If on nasal continuous positive airway pressure, respiratory therapy assess nares and determine need for appliance change or resting period  Outcome: Progressing  Flowsheets (Taken 12/06/2024 0836)  Skin Integrity Remains Intact:   Monitor for areas of redness and/or skin breakdown   Assess vascular access sites hourly     Problem: Safety - Adult  Goal: Free from fall injury  Description: Standard safety precautions in place.   Outcome: Progressing     Problem: ABCDS Injury Assessment  Goal: Absence of physical injury  Outcome: Progressing     Problem: Pain  Goal: Verbalizes/displays adequate comfort level or baseline comfort level  Description: Monitor pain using advanced dementia scale.   Outcome: Progressing     "

## 2024-12-06 NOTE — Discharge Summary (Addendum)
 "  Discharge Summary    Patient: Emily Henson MRN: [id number]  CSN: [id number]    Date of Birth: February 11, 1933  Age: 89 y.o.  Sex: female    DOA: 12/02/2024 LOS:  LOS: 4 days   Discharge Date: 12/06/24     Admission Diagnosis: Hypercalcemia [E83.52]  Acute coronary syndrome (HCC) [I24.9]  Chronic atrial fibrillation (HCC) [I48.20]  Acute CVA (cerebrovascular accident) (HCC) [I63.9]  Longstanding persistent atrial fibrillation (HCC) [I48.11]  Cerebrovascular accident (CVA), unspecified mechanism (HCC) [I63.9]    Discharge Diagnosis:    Principal Problem:    Cerebrovascular accident (CVA) (HCC)  Active Problems:    Hypertension    Moderate Alzheimer's dementia without behavioral disturbance, psychotic disturbance, mood disturbance, or anxiety, unspecified timing of dementia onset (HCC)    CAD (coronary artery disease)    Slurred speech    Suspected cerebrovascular accident (CVA)  Resolved Problems:    * No resolved hospital problems. *       Discharge Condition: Stable  Discharge Disposition: SNF     PHYSICAL EXAM  Visit Vitals  BP (!) 159/89   Pulse 98   Temp 97.7 F (36.5 C) (Axillary)   Resp 17   Ht 1.651 m (5' 5)   Wt 79.8 kg (176 lb)   SpO2 98%   BMI 29.29 kg/m       General: Alert, cooperative, no acute distress    HEENT: NC, Atraumatic.  PERRLA, EOMI. Anicteric sclerae.  Lungs:  CTA Bilaterally. No Wheezing/Rhonchi/Rales.  Heart:  Regular  rhythm,  No murmur, No Rubs, No Gallops  Abdomen: Soft, Non distended, Non tender.  +Bowel sounds, no HSM  Extremities: No c/c/e  Psych:   Good insight. Not anxious or agitated.  Neurologic:  CN 2-12 grossly intact, oriented X 3.  No acute neurological                                 Deficits,     Hospital Course By Problem:     #Slurred speech  #CVA  - acute cerebellar   #Nausea and vomiting, resolved   - Atorvastatin  + home med Eliquis   - Restarted home antihypertensives   - CTA head/neck: severe stenosis of L PCA. No LVO.  - CT head: moderate microvascular ischemic  changes  - CT head on 12/18 showed right cerebellar CVA. Results posted below.      #Persistent Afib RVR, on Eliquis   #S/P single chamber pacemaker placement 09/25/12 - not MRI compatible   - Concern of cardioembolic stroke, echo completed   - Echo 04/04/24 - EF 53%, visually mod enlarged LA  - 14d holter 04/04/24 - 100% AF, average HR 75 bpm, no symptoms     #HTN  - Home med Lopressor   continued.      #Alzheimer's dementia  - Home meds Donepezil  + Memantine   - Patient sundowns. May require additional scheduled med while inpatient.   - Qtc 414 ms, ok to continue home meds     #HLD  #CAD  - Home med Fenofibrate   - High dose statin per stroke tx protocol     Nausea  -PRN compazine  and zofran  on board while inpatient.   -Has not required in several days.    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     Consults:   Neuro     Significant Diagnostic Studies:     HISTORY: CT head 11/23/2024  COMPARISON: CT head 12/03/2024     Technique: CT images of the head were obtained. All CT scans at this facility  are performed using dose optimization technique as appropriate to the performed  exam, to include automated exposure control, adjustment of the mA and/or kV  according to patient's size (Including appropriate matching for site-specific  examinations), or use of iterative reconstruction technique.     FINDINGS: Interval development of hypodensity involving the superior right  cerebellum with loss of gray-white matter differentiation..  No intracranial hemorrhage, extra-axial fluid collection or mass effect.  No  shift of midline structures. Moderate burden of periventricular and deep white  matter low attenuation, nonspecific, but most consistent with chronic small  vessel ischemic disease.  Intact osseous structures.  The visualized paranasal air sinuses are aerated.     IMPRESSION:  1. Evolution of acute/subacute infarct right cerebellum. No acute intracranial  hemorrhage.  2. Moderate burden of  periventricular white matter microvascular disease.     Discussed #1 with Dr. Shona over the phone at 3:57 p.m., confirmed.    Discharge Medications:     Current Discharge Medication List        START taking these medications    Details   atorvastatin  (LIPITOR ) 80 MG tablet Take 1 tablet by mouth nightly  Qty: 30 tablet, Refills: 3      haloperidol  (HALDOL ) 1 MG tablet Take 1 tablet by mouth at bedtime  Qty: 3 tablet, Refills: 0           CONTINUE these medications which have NOT CHANGED    Details   verapamil  (VERELAN ) 240 MG extended release capsule TAKE 1 CAPSULE BY MOUTH ONCE DAILY  Qty: 90 capsule, Refills: 2    Associated Diagnoses: Atrial fibrillation, unspecified type (HCC)      fenofibrate  (TRICOR ) 145 MG tablet TAKE 1 TABLET BY MOUTH DAILY  Qty: 90 tablet, Refills: 1    Associated Diagnoses: Pure hyperglyceridemia      donepezil  (ARICEPT ) 10 MG tablet Take 1 tablet by mouth daily  Qty: 90 tablet, Refills: 0    Associated Diagnoses: Mild dementia without behavioral disturbance, psychotic disturbance, mood disturbance, or anxiety, unspecified dementia type (HCC)      metoprolol  (LOPRESSOR ) 100 MG tablet Take 1 tablet by mouth 2 times daily      memantine  (NAMENDA ) 5 MG tablet Take 1 tab by mouth nightly x 1 week then 1 tab twice daily.  Qty: 180 tablet, Refills: 1    Associated Diagnoses: Mild dementia without behavioral disturbance, psychotic disturbance, mood disturbance, or anxiety, unspecified dementia type (HCC)      apixaban  (ELIQUIS ) 5 MG TABS tablet Take 1 tablet by mouth 2 times daily  Qty: 180 tablet, Refills: 5    Associated Diagnoses: Atrial fibrillation, unspecified type (HCC)      vitamin E  1000 units capsule Take 1 capsule by mouth daily      meclizine  (ANTIVERT ) 25 MG tablet Take 1 tablet by mouth every 6 hours as needed for Dizziness  Qty: 30 tablet, Refills: 0    Associated Diagnoses: Dizziness      polyethyl glycol-propyl glycol 0.4-0.3 % (SYSTANE) 0.4-0.3 % ophthalmic solution Apply 2  drops to eye 2 times daily      calcium  carbonate 1500 (600 Ca) MG TABS tablet Take 1 tablet by mouth daily      Cholecalciferol  50 MCG (2000 UT) TABS Take 1 tablet by mouth daily      cyanocobalamin  1000 MCG tablet Take 1  tablet by mouth daily             Activity: activity as tolerated    Diet: soft and bite sized     Wound Care: none     Follow-up: with PCP, Saand, Emily SAUNDERS, MD in 7-10days      Minutes spent on discharge: >30 minutes spent coordinating this discharge (review instructions/follow-up, prescriptions, preparing report for sign off)    "

## 2024-12-06 NOTE — Care Coordination (Signed)
"     12/06/24 1028   IMM Letter   IMM Letter given to Patient/Family/Significant other/Guardian/POA/by: Valentin Fears RN   IMM Letter date given: 12/06/24   IMM Letter time given: 1323     Medicare pt, has received and signed 2nd IMM letter informing them of their right to appeal the discharge. Signed copy has been placed in pt bedside chart.     Valentin Fears BSN RN  Case Management  [phone]   "

## 2024-12-06 NOTE — Progress Notes (Signed)
"  Report called to [facility] and spoke with katelyn.  "

## 2024-12-06 NOTE — Progress Notes (Addendum)
"  2100 patient confused, has forgotten that she can pee with the purewick.    2300 son up to front desk, patient is confused again, unable to understand that she can pee in the purewick. Able to reorient the patient at this time.     0000 patient confused and will not listen to son when told not to get out of bed. Son requesting restraints. Reoriented patient, more difficult to calm patient down this time. She is more agitated and more defiant this time.     0155 prn agitation medication requested, ordered, and given.     0245 patient calmer, still awake. Son reports that she slept for about  "

## 2024-12-06 NOTE — Progress Notes (Addendum)
"  Patient ready for discharge, family at bedside    2315 calling [facility] transport to get update on ETA. Transport states another 15 minutes, truck is on the way.    2354 transport here for patient    0012 patient transported off floor to [facility]  "

## 2024-12-06 NOTE — Care Coordination (Addendum)
"  Transport to: [facility]  Reason for transport:   Transport set up with: [facility]  Time/Date: 12/06/24 1800, trip #[id number]  D/C Summary loaded: yes  Nurse/CM notified: yes  Envelope delivered: yes  Insurance verified on face sheet: yes  Auth needed: yes  Auth #: [id number]     Please call report to [phone]     Valentin Fears BSN RN  Case Management  [phone]      "

## 2024-12-06 NOTE — Care Coordination (Deleted)
"     12/06/24 1028   IMM Letter   IMM Letter given to Patient/Family/Significant other/Guardian/POA/by: Valentin Fears RN   IMM Letter date given: 12/06/24   IMM Letter time given: 1028     Medicare pt, has received and signed 2nd IMM letter informing them of their right to appeal the discharge. Signed copy has been placed in pt bedside chart.     Valentin Fears BSN RN  Case Management  [phone]  "

## 2024-12-07 MED FILL — MELATONIN 5 MG PO TBDP: 5 mg | ORAL | Qty: 1 | Fill #0

## 2024-12-07 MED FILL — HALOPERIDOL 2 MG PO TABS: 2 mg | ORAL | Qty: 1 | Fill #0

## 2024-12-10 ENCOUNTER — Emergency Department: Admit: 2024-12-10 | Payer: Medicare (Managed Care) | Primary: Internal Medicine

## 2024-12-10 ENCOUNTER — Inpatient Hospital Stay
Admission: EM | Admit: 2024-12-10 | Discharge: 2024-12-10 | Disposition: A | Discharge status: Skilled Nursing Facility | Payer: Medicare (Managed Care) | Arrived: AM | Source: Skilled Nursing Facility | Attending: Emergency Medicine | Admitting: Internal Medicine

## 2024-12-10 DIAGNOSIS — G9341 Metabolic encephalopathy: Principal | ICD-10-CM

## 2024-12-10 DIAGNOSIS — G928 Other toxic encephalopathy: Secondary | ICD-10-CM

## 2024-12-10 LAB — COMPREHENSIVE METABOLIC PANEL
ALT: 17 U/L (ref 10–49)
AST: 27 U/L (ref 0.0–33.9)
Albumin: 3.7 g/dL (ref 3.4–5.0)
Alkaline Phosphatase: 55 U/L (ref 46–116)
Anion Gap: 11 mmol/L (ref 5–15)
BUN: 10 mg/dL (ref 9–23)
CO2: 24 meq/L (ref 20–31)
Calcium: 11.3 mg/dL — ABNORMAL HIGH (ref 8.7–10.4)
Chloride: 105 meq/L (ref 98–107)
Creatinine: 0.85 mg/dL (ref 0.55–1.02)
GFR African American: >60
GFR Non-African American: >60
Glucose: 150 mg/dL — ABNORMAL HIGH (ref 74–106)
Potassium: 3.8 meq/L (ref 3.5–5.1)
Sodium: 140 meq/L (ref 136–145)
Total Bilirubin: 1 mg/dL (ref 0.30–1.20)
Total Protein: 6.8 g/dL (ref 5.7–8.2)

## 2024-12-10 LAB — POCT URINALYSIS DIPSTICK
Bilirubin, Urine: NEGATIVE
Blood, Urine: NEGATIVE
Glucose, Ur: NEGATIVE mg/dL
Ketones, Urine: NEGATIVE mg/dL
Leukocyte Esterase, Urine: NEGATIVE
Nitrite, Urine: NEGATIVE
Protein, Urine: 30 mg/dL — AB
Specific Gravity, UA: >=1.03 (ref 1.005–1.030)
Urobilinogen, Urine: 0.2 EU/dl (ref 0.0–1.0)
pH, Urine: 5.5 (ref 5–9)

## 2024-12-10 LAB — CBC WITH AUTO DIFFERENTIAL
Basophils: 0.2 % (ref 0–3)
Eosinophils: 0.5 % (ref 0–5)
Hematocrit: 47.7 % — ABNORMAL HIGH (ref 35.0–47.0)
Hemoglobin: 16.3 g/dL — ABNORMAL HIGH (ref 11.0–16.0)
Immature Granulocytes %: 0.4 % (ref 0.0–3.0)
Lymphocytes: 11.7 % — ABNORMAL LOW (ref 28–48)
MCH: 31.2 pg (ref 25.4–34.6)
MCHC: 34.2 g/dL (ref 30.0–36.0)
MCV: 91.4 fL (ref 80.0–98.0)
MPV: 10.9 fL — ABNORMAL HIGH (ref 6.0–10.0)
Monocytes: 6.4 % (ref 1–13)
Neutrophils Segmented: 80.8 % — ABNORMAL HIGH (ref 34–64)
Nucleated RBCs: 0 (ref 0–0)
Platelets: 285 1000/mm3 (ref 140–450)
RBC: 5.22 M/uL — ABNORMAL HIGH (ref 3.60–5.20)
RDW: 44.6 (ref 36.4–46.3)
WBC: 11.2 1000/mm3 — ABNORMAL HIGH (ref 4.0–11.0)

## 2024-12-10 LAB — LACTATE, SEPSIS
Lactate: 2.1 mmol/L (ref 0.5–2.2)
Lactate: 2.8 mmol/L — ABNORMAL HIGH (ref 0.5–2.2)

## 2024-12-10 LAB — LIPASE: Lipase: 30 U/L (ref 12–53)

## 2024-12-10 LAB — MAGNESIUM: Magnesium: 1.7 mg/dL (ref 1.6–2.6)

## 2024-12-10 LAB — TROPONIN
Troponin, High Sensitivity: 9 ng/L (ref 0–34)
Troponin, High Sensitivity: 9 ng/L (ref 0–34)

## 2024-12-10 LAB — PROCALCITONIN: Procalcitonin: <0.05 ng/mL (ref 0.00–0.50)

## 2024-12-10 MED ORDER — APIXABAN 5 MG PO TABS
5 | Freq: Two times a day (BID) | ORAL | Status: DC
Start: 2024-12-10 — End: 2024-12-16
  Administered 2024-12-11 – 2024-12-16 (×12): 5 mg via ORAL

## 2024-12-10 MED ORDER — NORMAL SALINE FLUSH 0.9 % IV SOLN
0.9 | INTRAVENOUS | Status: DC | PRN
Start: 2024-12-10 — End: 2024-12-16

## 2024-12-10 MED ORDER — NORMAL SALINE FLUSH 0.9 % IV SOLN
0.9 | Freq: Two times a day (BID) | INTRAVENOUS | Status: DC
Start: 2024-12-10 — End: 2024-12-16
  Administered 2024-12-11 – 2024-12-16 (×10): 10 mL via INTRAVENOUS

## 2024-12-10 MED ORDER — MAGNESIUM SULFATE 2000 MG/50 ML IVPB PREMIX
2 | INTRAVENOUS | Status: DC | PRN
Start: 2024-12-10 — End: 2024-12-16

## 2024-12-10 MED ORDER — ONDANSETRON 4 MG PO TBDP
4 | Freq: Three times a day (TID) | ORAL | Status: DC | PRN
Start: 2024-12-10 — End: 2024-12-16

## 2024-12-10 MED ORDER — METOPROLOL TARTRATE 50 MG PO TABS
50 | Freq: Two times a day (BID) | ORAL | Status: DC
Start: 2024-12-10 — End: 2024-12-16
  Administered 2024-12-11 – 2024-12-16 (×12): 100 mg via ORAL

## 2024-12-10 MED ORDER — ACETAMINOPHEN 325 MG PO TABS
325 | Freq: Four times a day (QID) | ORAL | Status: DC | PRN
Start: 2024-12-10 — End: 2024-12-16
  Administered 2024-12-11: 03:00:00 650 mg via ORAL

## 2024-12-10 MED ORDER — ONDANSETRON HCL 4 MG/2ML IJ SOLN
4 | Freq: Once | INTRAMUSCULAR | Status: AC
Start: 2024-12-10 — End: 2024-12-10
  Administered 2024-12-10: 15:00:00 4 mg via INTRAVENOUS

## 2024-12-10 MED ORDER — ACETAMINOPHEN 650 MG RE SUPP
650 | Freq: Four times a day (QID) | RECTAL | Status: DC | PRN
Start: 2024-12-10 — End: 2024-12-16

## 2024-12-10 MED ORDER — ONDANSETRON HCL 4 MG/2ML IJ SOLN
4 | Freq: Four times a day (QID) | INTRAMUSCULAR | Status: DC | PRN
Start: 2024-12-10 — End: 2024-12-16

## 2024-12-10 MED ORDER — POTASSIUM CHLORIDE CRYS ER 20 MEQ PO TBCR
20 | ORAL | Status: DC | PRN
Start: 2024-12-10 — End: 2024-12-16

## 2024-12-10 MED ORDER — SODIUM CHLORIDE 0.9 % IV SOLN
0.9 | INTRAVENOUS | Status: DC | PRN
Start: 2024-12-10 — End: 2024-12-16

## 2024-12-10 MED ORDER — POTASSIUM CHLORIDE 10 MEQ/100ML IV SOLN
10 | INTRAVENOUS | Status: DC | PRN
Start: 2024-12-10 — End: 2024-12-16

## 2024-12-10 MED ORDER — DEXTROSE-SODIUM CHLORIDE 5-0.45 % IV SOLN
5-0.45 | INTRAVENOUS | Status: AC
Start: 2024-12-10 — End: 2024-12-12
  Administered 2024-12-10 – 2024-12-12 (×4): via INTRAVENOUS

## 2024-12-10 MED ORDER — DONEPEZIL HCL 5 MG PO TABS
5 | Freq: Every day | ORAL | Status: DC
Start: 2024-12-10 — End: 2024-12-16
  Administered 2024-12-11 – 2024-12-16 (×6): 10 mg via ORAL

## 2024-12-10 MED ORDER — MEMANTINE HCL 5 MG PO TABS
5 | Freq: Every day | ORAL | Status: DC
Start: 2024-12-10 — End: 2024-12-16
  Administered 2024-12-11 – 2024-12-16 (×6): 10 mg via ORAL

## 2024-12-10 MED ORDER — POTASSIUM CHLORIDE 20 MEQ/15ML (10%) PO SOLN
20 | ORAL | Status: DC | PRN
Start: 2024-12-10 — End: 2024-12-16

## 2024-12-10 MED ORDER — SODIUM CHLORIDE 0.9 % IV BOLUS
0.9 | Freq: Once | INTRAVENOUS | Status: AC
Start: 2024-12-10 — End: 2024-12-10
  Administered 2024-12-10: 15:00:00 1000 mL via INTRAVENOUS

## 2024-12-10 MED ORDER — POLYETHYLENE GLYCOL 3350 17 G PO PACK
17 | Freq: Every day | ORAL | Status: DC | PRN
Start: 2024-12-10 — End: 2024-12-16

## 2024-12-10 MED FILL — ONDANSETRON HCL 4 MG/2ML IJ SOLN: 4 MG/2ML | INTRAMUSCULAR | Qty: 2 | Fill #0

## 2024-12-10 MED FILL — DEXTROSE-SODIUM CHLORIDE 5-0.45 % IV SOLN: 5-0.45 % | INTRAVENOUS | Qty: 1000 | Fill #0

## 2024-12-10 NOTE — ED Notes (Signed)
"  Pt transported to CT at this time.      Shona Agent, RN  12/10/24 1024    "

## 2024-12-10 NOTE — ED Notes (Signed)
"  Pt family state this it pt baseline since her stroke, family remains at bedside. Pt given x3 warm blankets for comfort, call light within reach and family educated on proper use of call light.      Shona Agent, RN  12/10/24 1111    "

## 2024-12-10 NOTE — ED Provider Notes (Signed)
 "[facility]  Emergency Department Treatment Report        Patient: Emily Henson Age: 89 y.o. Sex: female    Date of Birth: 1933-01-10 Admit Date: 12/10/2024 PCP: Cephas Cletis SAUNDERS, MD   MRN: [id number]  CSN: [id number]     Room: ER24/ER24 Time Dictated: 4:22 PM            Chief Complaint   Chief Complaint   Patient presents with    Fatigue    Chest Pain    Vomiting       History of Present Illness   This is a 89 y.o. female who lives in skilled nursing facility after being sent here post stroke recovery at [facility].  She was fine at 9:00 at baseline when all of a sudden she became unresponsive and vomited.  Paramedics were summoned.  Found of a blood pressure 118/70.  They placed her on 10 L of oxygen.  Her heart rate was paced at 60.  Temperature 96 to axillary.  She began to feel more awake upon arrival here    Review of Systems   According paramedics and the patient she was not ill prior to this.  Patient does not remember throwing up before that.  Patient complained of a very minimal headache.  She denied any shortness of breath.  No other complaints though by nursing home staff    Past Medical/Surgical History     Past Medical History:   Diagnosis Date    A-fib [facility])     Arthritis     CAD (coronary artery disease)     Dementia (HCC)     Hearing loss     History of abnormal Holter exam 04/07/2024    14 day monitor 04/07/24 100% AF average HR 75 bpm, no symptoms    Hypercholesterolemia     hypertriglycerides    Hypertension     Permanent atrial fibrillation (HCC)     Unclear onset. VVI pacing since 2013. Rates now controlled. 100% AF per 14d monitor 04/07/24.    Sick sinus syndrome [facility])      Past Surgical History:   Procedure Laterality Date    APPENDECTOMY      BREAST REDUCTION SURGERY N/A 1990    CARDIAC PACEMAKER PLACEMENT  09/25/2012    Single chamber Abbott pacemaker implantation for SSS    CHOLECYSTECTOMY N/A 2004    COLECTOMY  2011    Partial colectomy 2011 for diverticulosis     COLONOSCOPY      HYSTERECTOMY, TOTAL ABDOMINAL (CERVIX REMOVED) N/A 1978     Admitted for hypercalcemia, stroke December 16 discharged December 20  Social History     Social History     Socioeconomic History    Marital status: Widowed     Spouse name: Not on file    Number of children: Not on file    Years of education: Not on file    Highest education level: Not on file   Occupational History    Not on file   Tobacco Use    Smoking status: Former    Smokeless tobacco: Never   Substance and Sexual Activity    Alcohol  use: Yes     Comment: occ    Drug use: No    Sexual activity: Not Currently   Other Topics Concern    Not on file   Social History Narrative    Not on file     Social Drivers of Health  Financial Resource Strain: Low Risk  (07/19/2023)    Overall Financial Resource Strain (CARDIA)     Difficulty of Paying Living Expenses: Not hard at all   Food Insecurity: No Food Insecurity (12/04/2024)    Hunger Vital Sign     Worried About Running Out of Food in the Last Year: Never true     Ran Out of Food in the Last Year: Never true   Transportation Needs: No Transportation Needs (12/04/2024)    PRAPARE - Therapist, Art (Medical): No     Lack of Transportation (Non-Medical): No   Physical Activity: Inactive (04/17/2024)    Exercise Vital Sign     Days of Exercise per Week: 0 days     Minutes of Exercise per Session: 0 min   Stress: Not on file   Social Connections: Feeling Socially Integrated (02/20/2023)    OASIS D0700: Social Isolation     Frequency of experiencing loneliness or isolation: Rarely   Intimate Partner Violence: Not on file   Housing Stability: Low Risk  (12/04/2024)    Housing Stability Vital Sign     Unable to Pay for Housing in the Last Year: No     Number of Times Moved in the Last Year: 0     Homeless in the Last Year: No     Skilled nursing facility  Family History     Family History   Problem Relation Age of Onset    Diabetes Mother     Hypertension Mother      Alzheimer's Disease Sister     Sleep Apnea Sister        Current Medications     Current Facility-Administered Medications   Medication Dose Route Frequency Provider Last Rate Last Admin    apixaban  (ELIQUIS ) tablet 5 mg  5 mg Oral BID Onita Pill, MD        donepezil  (ARICEPT ) tablet 10 mg  10 mg Oral Daily Onita Pill, MD        memantine  (NAMENDA ) tablet 10 mg  10 mg Oral Daily Yan, Jieming, MD        metoprolol  tartrate (LOPRESSOR ) tablet 100 mg  100 mg Oral BID Onita Pill, MD        sodium chloride  flush 0.9 % injection 5-40 mL  5-40 mL IntraVENous 2 times per day Onita Pill, MD        sodium chloride  flush 0.9 % injection 5-40 mL  5-40 mL IntraVENous PRN Onita Pill, MD        0.9 % sodium chloride  infusion   IntraVENous PRN Onita Pill, MD        potassium chloride  (KLOR-CON  M) extended release tablet 40 mEq  40 mEq Oral PRN Onita Pill, MD        Or    potassium chloride  20 MEQ/15ML (10%) oral solution 40 mEq  40 mEq Oral PRN Onita Pill, MD        Or    potassium chloride  10 mEq/100 mL IVPB (Peripheral Line)  10 mEq IntraVENous PRN Onita Pill, MD        magnesium  sulfate 2000 mg in 50 mL IVPB premix  2,000 mg IntraVENous PRN Onita Pill, MD        ondansetron  (ZOFRAN -ODT) disintegrating tablet 4 mg  4 mg Oral Q8H PRN Onita Pill, MD        Or    ondansetron  (ZOFRAN ) injection 4 mg  4 mg IntraVENous Q6H PRN Onita Pill,  MD        polyethylene glycol (GLYCOLAX ) packet 17 g  17 g Oral Daily PRN Onita Pill, MD        acetaminophen  (TYLENOL ) tablet 650 mg  650 mg Oral Q6H PRN Onita Pill, MD        Or    acetaminophen  (TYLENOL ) suppository 650 mg  650 mg Rectal Q6H PRN Onita Pill, MD        dextrose  5 % and 0.45 % sodium chloride  infusion   IntraVENous Continuous Onita Pill, MD 100 mL/hr at 12/10/24 1522 New Bag at 12/10/24 1522     Current Outpatient Medications   Medication Sig Dispense Refill    atorvastatin  (LIPITOR ) 80 MG tablet Take 1 tablet by mouth nightly 30 tablet 3    verapamil   (VERELAN ) 240 MG extended release capsule TAKE 1 CAPSULE BY MOUTH ONCE DAILY 90 capsule 2    fenofibrate  (TRICOR ) 145 MG tablet TAKE 1 TABLET BY MOUTH DAILY 90 tablet 1    donepezil  (ARICEPT ) 10 MG tablet Take 1 tablet by mouth daily 90 tablet 0    metoprolol  (LOPRESSOR ) 100 MG tablet Take 1 tablet by mouth 2 times daily      memantine  (NAMENDA ) 5 MG tablet Take 1 tab by mouth nightly x 1 week then 1 tab twice daily. 180 tablet 1    apixaban  (ELIQUIS ) 5 MG TABS tablet Take 1 tablet by mouth 2 times daily 180 tablet 5    vitamin E  1000 units capsule Take 1 capsule by mouth daily      meclizine  (ANTIVERT ) 25 MG tablet Take 1 tablet by mouth every 6 hours as needed for Dizziness 30 tablet 0    polyethyl glycol-propyl glycol 0.4-0.3 % (SYSTANE) 0.4-0.3 % ophthalmic solution Apply 2 drops to eye 2 times daily      calcium  carbonate 1500 (600 Ca) MG TABS tablet Take 1 tablet by mouth daily      Cholecalciferol  50 MCG (2000 UT) TABS Take 1 tablet by mouth daily      cyanocobalamin  1000 MCG tablet Take 1 tablet by mouth daily         Allergies     Allergies   Allergen Reactions    Caffeine Palpitations       Physical Exam   Patient Vitals for the past 24 hrs:   Temp Pulse Resp BP SpO2   12/10/24 1556 -- 60 20 -- 97 %   12/10/24 1546 -- 62 16 (!) 158/89 97 %   12/10/24 1543 -- 66 19 -- 98 %   12/10/24 1534 -- 64 19 -- 97 %   12/10/24 1529 -- 63 18 (!) 158/78 92 %   12/10/24 1524 -- 60 17 (!) 165/76 97 %   12/10/24 1511 -- 71 17 -- 98 %   12/10/24 1501 -- 55 16 (!) 150/77 98 %   12/10/24 1451 -- 60 14 -- 98 %   12/10/24 1441 -- 60 18 (!) 148/80 97 %   12/10/24 1432 -- 60 19 (!) 153/79 97 %   12/10/24 1418 -- 60 18 119/70 97 %   12/10/24 1358 -- 60 16 126/72 99 %   12/10/24 1348 -- 60 19 127/75 97 %   12/10/24 1339 -- 60 18 -- 97 %   12/10/24 1329 -- 60 17 (!) 142/70 97 %   12/10/24 1319 -- 60 -- 139/71 97 %   12/10/24 1257 -- 60 19 -- 98 %   12/10/24 1247 --  60 20 124/74 98 %   12/10/24 1227 -- 60 14 136/73 97 %   12/10/24  1217 -- 60 21 (!) 141/77 96 %   12/10/24 1157 -- 60 15 118/68 98 %   12/10/24 1147 -- 60 15 136/72 97 %   12/10/24 1145 -- 60 15 -- 98 %   12/10/24 1135 -- 60 24 (!) 140/74 98 %   12/10/24 1115 -- 60 20 130/79 97 %   12/10/24 1105 -- 60 14 119/72 97 %   12/10/24 1101 -- 60 16 119/72 97 %   12/10/24 1045 -- 60 12 120/72 94 %   12/10/24 1005 97.8 F (36.6 C) 60 16 113/80 98 %   12/10/24 1000 -- 60 -- 115/71 100 %     Patient: Emily Henson MRN: [id number]  CSN: [id number]    Date of Birth: 1933-11-07  Age: 89 y.o.  Sex: female    DOA: 12/02/2024 LOS:  LOS: 4 days   Discharge Date: 12/06/24      Constitutional: Patient appears well developed and well nourished. SABRA Appearance and behavior are age and situation appropriate.  He is sitting with her eyes closed but does open them to verbal command.  She follows commands.  HEENT: Conjunctiva clear.  PERRLA. Mucous membranes moist, non-erythematous. Surface of the pharynx, palate, and tongue are pink, moist and without lesions.  Neck: supple, non tender, symmetrical, no masses or JVD.   Respiratory: lungs clear to auscultation, nonlabored respirations. No tachypnea or accessory muscle use.  Cardiovascular: heart regular rate and rhythm without murmur rubs or gallops.   Calves soft and non-tender. Distal pulses 2+ and equal bilaterally.  No peripheral edema   Gastrointestinal:  Abdomen soft, nontender without complaint of pain to palpation  Musculoskeletal: Nail beds pink with prompt capillary refill  Integumentary: warm and dry without rashes or lesions  Neurologic: Issue moves all extremities and able to hold them up with intact motor strength.  No visible facial droop.  Speech is clear.  Has intact sensation all extremities.  Intact sensation face.  She is oriented to name and birthday.       Impression and Management Plan   Patient presents here with altered mental status, vomiting and generalized weakness.  Differential diagnoses arrhythmia, electrolyte abnormality,  abdominal discomfort with vomiting, does not have any focal neurological deficits but consider rebleed.    Diagnostic Studies   Lab:   Results for orders placed or performed during the hospital encounter of 12/10/24   Comprehensive Metabolic Panel   Result Value Ref Range    Potassium 3.8 3.5 - 5.1 mEq/L    Chloride 105 98 - 107 mEq/L    Sodium 140 136 - 145 mEq/L    CO2 24 20 - 31 mEq/L    Glucose 150 (H) 74 - 106 mg/dl    BUN 10 9 - 23 mg/dl    Creatinine 9.14 9.44 - 1.02 mg/dl    GFR African American >60.0      GFR Non-African American >60      Calcium  11.3 (H) 8.7 - 10.4 mg/dl    Anion Gap 11 5 - 15 mmol/L    AST 27.0 0.0 - 33.9 U/L    ALT 17 10 - 49 U/L    Alkaline Phosphatase 55 46 - 116 U/L    Total Bilirubin 1.00 0.30 - 1.20 mg/dl    Total Protein 6.8 5.7 - 8.2 gm/dl    Albumin 3.7 3.4 - 5.0 gm/dl  CBC with Auto Differential   Result Value Ref Range    WBC 11.2 (H) 4.0 - 11.0 1000/mm3    RBC 5.22 (H) 3.60 - 5.20 M/uL    Hemoglobin 16.3 (H) 11.0 - 16.0 gm/dl    Hematocrit 52.2 (H) 35.0 - 47.0 %    MCV 91.4 80.0 - 98.0 fL    MCH 31.2 25.4 - 34.6 pg    MCHC 34.2 30.0 - 36.0 gm/dl    Platelets 714 859 - 450 1000/mm3    MPV 10.9 (H) 6.0 - 10.0 fL    RDW 44.6 36.4 - 46.3      Nucleated RBCs 0 0 - 0      Immature Granulocytes % 0.4 0.0 - 3.0 %    Neutrophils Segmented 80.8 (H) 34 - 64 %    Lymphocytes 11.7 (L) 28 - 48 %    Monocytes 6.4 1 - 13 %    Eosinophils 0.5 0 - 5 %    Basophils 0.2 0 - 3 %   Troponin   Result Value Ref Range    Troponin, High Sensitivity 9 0 - 34 ng/L   Troponin   Result Value Ref Range    Troponin, High Sensitivity 9 0 - 34 ng/L   Magnesium    Result Value Ref Range    Magnesium  1.7 1.6 - 2.6 mg/dL   Lactate, Sepsis   Result Value Ref Range    Lactate 2.1 0.5 - 2.2 mmol/L   Lactate, Sepsis   Result Value Ref Range    Lactate 2.8 (H) 0.5 - 2.2 mmol/L   Lipase   Result Value Ref Range    Lipase 30 12 - 53 U/L   Procalcitonin   Result Value Ref Range    Procalcitonin <0.05 0.00 - 0.50 ng/ml    POCT Urinalysis no Micro   Result Value Ref Range    Glucose, Ur Negative NEGATIVE,Negative mg/dl    Bilirubin, Urine Negative NEGATIVE,Negative      Ketones, Urine Negative NEGATIVE,Negative mg/dl    Specific Gravity, UA >=1.030 1.005 - 1.030      Blood, Urine Negative NEGATIVE,Negative      pH, Urine 5.5 5 - 9      Protein, Urine 30 (A) NEGATIVE,Negative mg/dl    Urobilinogen, Urine 0.2 0.0 - 1.0 EU/dl    Nitrite, Urine Negative NEGATIVE,Negative      Leukocyte Esterase, Urine Negative NEGATIVE,Negative      Color, UA Dark yellow      Clarity, UA Clear        Imaging:    No results found.  XR CHEST PORTABLE   Final Result   IMPRESSION:   1.  No focal airspace opacity.   2.  Note that the patient is moderately rotated, reducing sensitivity for   detection of pathology in the mediastinum and left lung base.      Electronically signed by: Morton Riches, MD 12/10/2024 11:39 AM EST             Workstation ID: [id number]         CT Head W/O Contrast   Final Result   IMPRESSION:   1.  No evidence of an acute intracranial process. If there is high clinical   concern for acute infarction, MRI could be considered for further evaluation as   it is a more sensitive examination.   2.  Chronic atrophic and microvascular ischemic changes.   3.  Poorly defined hypodensity in the right cerebellar hemisphere;  degraded by   streak artifact from dental amalgam. Likely, this represents encephalomalacia in   the setting of prior infarct. Attention on MRI, if obtained.      Electronically signed by: Morton Riches, MD 12/10/2024 11:03 AM EST             Workstation ID: [id number]             EKG based on my personal interpretation shows patient rhythm of 60.  Paced    Prehospital EKG: Rhythm of 60 paced    Cardiac Monitor Interpretation by myself: Remained paced at 60    Imaging: Based on my personal interpretation, the head CT shows no visible intracranial hemorrhage    Other studies:  My interpretation of other studies is that  they show, among other things, troponin 9 9 show the patient is not sustained myocardial injury or infarction  Urine spec graph greater than 1.030 showing patient sustained significant dehydration.  ED Course         RECORDS REVIEWED:  I reviewed the patient's previous records here at [facility] and available outside facilities and note that patient had recent stroke and was just discharged 4 days ago from hospital.      INDEPENDENT HISTORIAN:  History and/or plan development assisted by: Damien members at the bedside          Threat to body function without evaluation and management: Potential new stroke or electrolyte abnormality requiring admission      SOCIAL DETERMINANTS  impacting Evaluation and Management: Is in skilled nursing facility and potentially receiving extra Haldol       Comorbidities impacting Evaluation and Management: Gaile age and recent stroke chronic atrial fibrillation.        Medications   apixaban  (ELIQUIS ) tablet 5 mg (5 mg Oral Not Given 12/10/24 1431)   donepezil  (ARICEPT ) tablet 10 mg (10 mg Oral Not Given 12/10/24 1431)   memantine  (NAMENDA ) tablet 10 mg (10 mg Oral Not Given 12/10/24 1431)   metoprolol  tartrate (LOPRESSOR ) tablet 100 mg (100 mg Oral Not Given 12/10/24 1431)   sodium chloride  flush 0.9 % injection 5-40 mL (has no administration in time range)   sodium chloride  flush 0.9 % injection 5-40 mL (has no administration in time range)   0.9 % sodium chloride  infusion (has no administration in time range)   potassium chloride  (KLOR-CON  M) extended release tablet 40 mEq (has no administration in time range)     Or   potassium chloride  20 MEQ/15ML (10%) oral solution 40 mEq (has no administration in time range)     Or   potassium chloride  10 mEq/100 mL IVPB (Peripheral Line) (has no administration in time range)   magnesium  sulfate 2000 mg in 50 mL IVPB premix (has no administration in time range)   ondansetron  (ZOFRAN -ODT) disintegrating tablet 4 mg (has no administration in time range)      Or   ondansetron  (ZOFRAN ) injection 4 mg (has no administration in time range)   polyethylene glycol (GLYCOLAX ) packet 17 g (has no administration in time range)   acetaminophen  (TYLENOL ) tablet 650 mg (has no administration in time range)     Or   acetaminophen  (TYLENOL ) suppository 650 mg (has no administration in time range)   dextrose  5 % and 0.45 % sodium chloride  infusion ( IntraVENous New Bag 12/10/24 1522)   ondansetron  (ZOFRAN ) injection 4 mg (4 mg IntraVENous Given 12/10/24 1015)   sodium chloride  0.9 % bolus 1,000 mL (0 mLs IntraVENous Stopped 12/10/24 1044)  NARRATIVE:  Patient remained arousable but very sleepy.  According to family is very typical for her and usually say chatty Nathanel.  She was unable to get up off the stretcher and had to be admitted to the hospital.  I talked to Dr. Halford and hospitalist and he is going to follow her letting her medications wear off to see if that was a sedation issue for her.  She does not have any further pain complaints.  She was hydrated with IV fluids      Medical Decision Making   Patient with metabolic encephalopathy was sent here because she vomited which she has no further vomiting during her stay here.  Appears still drowsy requires further evaluation and treatment.  Final Diagnosis       ICD-10-CM    1. Acute metabolic encephalopathy  G93.41       2. Nausea and vomiting, unspecified vomiting type  R11.2             Disposition   Admit    Dayton Pay, MD AAEM  December 10, 2024    My signature above authenticates this document and my orders, the final    diagnosis (es), discharge prescription (s), and instructions in the Epic    record.  If you have any questions please contact [phone].     Nursing notes have been reviewed by the physician/ advanced practice    Clinician.    Dictation disclaimer: Additionally, portions of this document may have been dictated using Scientist, Clinical (histocompatibility And Immunogenetics). Variances in spelling and vocabulary are possible  and unintentional. Every attempt is made to minimize errors and discrepancies. Please notify the dino if any discrepancies are noted or if the meaning of any statement is not clear                              Pay Dayton DEL, MD  12/10/24 1624    "

## 2024-12-10 NOTE — Care Coordination (Signed)
"     12/10/24 1521   Readmission Assessment   Who is being Jacqualin Other  (dtr)   Number of Days since last admission? 1-7 days   Previous Disposition SNF   What was the patient's/caregiver's perception as to why they think they needed to return back to the hospital? Worsening of symptoms/Unexpected complications   Did you visit your Primary Care Physician after you left the hospital, before you returned this time? No   Why weren't you able to visit your PCP? Did not have an appointment   Did you see a specialist, such as Cardiac, Pulmonary, Orthopedic Physician, etc. after you left the hospital? No   Who advised the patient to return to the hospital? Skilled Unit   Does the patient report anything that got in the way of taking their medications? No   What could we have done to help prevent your return to the hospital? No opportunities identified by patient   Have You Received a Follow Up Phone Call from a HealthCare Provider After Discharge? Yes       "

## 2024-12-10 NOTE — H&P (Signed)
 "Hospitalist Admission Note    NAME:  Emily Henson   DOB:   21-Dec-1932   MRN:   [id number]     Date/Time:  12/10/2024 2:19 PM    Subjective:     CHIEF COMPLAINT:    Chief Complaint   Patient presents with    Fatigue    Chest Pain    Vomiting       HISTORY OF PRESENT ILLNESS:     Emily Henson is a 89 y.o.  female with history significant for Alzheimer disease dementia, chronic atrial fibrillation on Eliquis , recently acute CVA with aphasia, coronary artery disease, hypertension, hyperlipidemia, vitamin B12 deficiency, presented with complaint over the altered mental status.  Patient is a resident of the nursing home . acute encephalopathy.  According to the nursing home staff.  Patient has moderate confused and had an episode of nausea vomiting, received Haldol .  Patient was found become to the lethargic this morning.  EMS was called the patient was brought to the emergency room to be evaluated, in the emergency, he blood pressure 115/71 heart rate 60 respiratory 16 temperature 97.8 O2 sat 100% on the nonrebreather.  Chest x-ray did not show acute change.  Head CT.  No acute finding.  So patient was admitted to the hospital for further management.    Past Medical History:   Diagnosis Date    A-fib [facility])     Arthritis     CAD (coronary artery disease)     Dementia (HCC)     Hearing loss     History of abnormal Holter exam 04/07/2024    14 day monitor 04/07/24 100% AF average HR 75 bpm, no symptoms    Hypercholesterolemia     hypertriglycerides    Hypertension     Permanent atrial fibrillation (HCC)     Unclear onset. VVI pacing since 2013. Rates now controlled. 100% AF per 14d monitor 04/07/24.    Sick sinus syndrome (HCC)         Social History     Tobacco Use    Smoking status: Former    Smokeless tobacco: Never   Substance Use Topics    Alcohol  use: Yes     Comment: occ        Family History   Problem Relation Age of Onset    Diabetes Mother     Hypertension Mother     Alzheimer's Disease Sister     Sleep Apnea Sister          Allergies   Allergen Reactions    Caffeine Palpitations        Prior to Admission medications   Medication Sig Start Date End Date Taking? Authorizing Provider   atorvastatin  (LIPITOR ) 80 MG tablet Take 1 tablet by mouth nightly 12/06/24   Hall, Brittney N, DO   verapamil  (VERELAN ) 240 MG extended release capsule TAKE 1 CAPSULE BY MOUTH ONCE DAILY 07/01/24   Saand, Mariam R, MD   fenofibrate  (TRICOR ) 145 MG tablet TAKE 1 TABLET BY MOUTH DAILY 07/01/24   Saand, Mariam R, MD   donepezil  (ARICEPT ) 10 MG tablet Take 1 tablet by mouth daily 05/28/24   Beecher Leita LABOR, APRN - NP   metoprolol  (LOPRESSOR ) 100 MG tablet Take 1 tablet by mouth 2 times daily 03/27/24   [provider]   memantine  (NAMENDA ) 5 MG tablet Take 1 tab by mouth nightly x 1 week then 1 tab twice daily. 01/21/24   Beecher Leita LABOR, APRN - NP   apixaban  (  ELIQUIS ) 5 MG TABS tablet Take 1 tablet by mouth 2 times daily 01/15/24   Saand, Mariam R, MD   vitamin E  1000 units capsule Take 1 capsule by mouth daily    [provider]   meclizine  (ANTIVERT ) 25 MG tablet Take 1 tablet by mouth every 6 hours as needed for Dizziness 02/07/23   Saand, Mariam R, MD   polyethyl glycol-propyl glycol 0.4-0.3 % (SYSTANE) 0.4-0.3 % ophthalmic solution Apply 2 drops to eye 2 times daily    [provider]   calcium  carbonate 1500 (600 Ca) MG TABS tablet Take 1 tablet by mouth daily    [provider]   Cholecalciferol  50 MCG (2000 UT) TABS Take 1 tablet by mouth daily 09/26/17   Automatic Reconciliation, Ar   cyanocobalamin  1000 MCG tablet Take 1 tablet by mouth daily 11/04/20   Automatic Reconciliation, Ar       REVIEW OF SYSTEMS:    History obtained from chart review and the patient  Cannot provide a history due to her lethargic    Objective:   VITALS:   BP 119/70   Pulse 60   Temp 97.8 F (36.6 C) (Oral)   Resp 18   Ht 1.651 m (5' 5)   Wt 79.8 kg (176 lb)   SpO2 97%   BMI 29.29 kg/m          PHYSICAL EXAM:   General:    Lying in  bed in no acute distress.  HEENT:  Pupils equal.  Sclera anicteric.  Conjunctiva pink.  Mucous membranes                           moist  Neck:  Supple.  Trachea midline.  No accessory muscle use.  No thyromegaly.                           No jugular venous distention  CV:                  Regular rate and rhythm.  Lungs:   Clear to auscultation bilaterally.  No Wheezing or Rhonchi. No rales.                           Normal to percussion  Abdomen:   Soft, non-tender. Not distended.  Bowel sounds normal. No organomegaly  Extremities: No cyanosis.  No edema. No clubbing  Neurologic: Lethargic  Skin:                Warm and dry.  No rashes.       LAB DATA REVIEWED:    Lab Results   Component Value Date/Time    WBC 11.2 12/10/2024 10:18 AM    HGB 16.3 12/10/2024 10:18 AM    HCT 47.7 12/10/2024 10:18 AM    PLT 285 12/10/2024 10:18 AM    MCV 91.4 12/10/2024 10:18 AM       Lab Results   Component Value Date/Time    NA 140 12/10/2024 10:18 AM    K 3.8 12/10/2024 10:18 AM    CL 105 12/10/2024 10:18 AM    CO2 24 12/10/2024 10:18 AM    BUN 10 12/10/2024 10:18 AM    GFRAA >60.0 12/10/2024 10:18 AM       EKG:   Encounter Date: 12/02/24   EKG 12 Lead (Abn HR)   Result Value  Ventricular Rate 73    Atrial Rate 98    QRS Duration 102    Q-T Interval 376    QTc Calculation (Bazett) 414    R Axis -6    T Axis -55    Diagnosis      Atrial fibrillation  Nonspecific ST and T wave abnormality  Abnormal ECG  No previous ECGs available  Confirmed by Rea, MD, Jun ([id number]) on 12/03/2024 9:32:41 AM         XR Results (most recent):  No acute change        CT Results (most recent):  No acute distress      Assessment/Plan:      Principal Problem:  Acute encephalopathy  Head CT did not show acute change  UA and urine culture  Chest x-ray no infiltrate  Hold off the sedation medication  Keep n.p.o.  IV fluid hydration  And monitor mental status  Consider repeat MRI in the brain if persistent and persistent  Check TSH to thiamine level and  ammonia level    Recently acute CVA  On anticoagulation  And monitor mental status  PT OT eval    Chronic atrial fibrillation  Continue metoprolol  for rate control  Continue anticoagulation with Eliquis     Alzheimer disease dementia  Monitor mental status  Patient to take Aricept  and Namenda  at home  Continue current medication    Lactic acidosis trending lactic acid and with IV fluid    CAD continue statin and beta-blocker    Hyperlipidemia on statin at home  Hypertension continue metoprolol  monitor pressure closely  Vitamin B12 deficiency vitamin B12 replacement    Continue home regimen for otherwise chronic, stable medical conditions as noted above. Discussed with patient and or family regarding management, prognosis, treatment and complications of the patient's medical conditions in detail. All questions answered to the satisfaction of those individual(s) who also verbalized understanding of and agreement with the assessment and plan.     Prophylaxis:  [] Lovenox  [] Coumadin  [] Hep SQ  [x] SCDs  [] H2B/PPI    Disposition:  [x] Home w/ Family   [] HH PT,OT,RN   [] SNF/LTC   [] SAH/Rehab    Discussed Code Status:    [] Full Code      [x] DNR     ___________________________________________________    Care Plan discussed with:    [x] Patient   [x] Family    [] ED Care Manager  [x] ED Doc   [] Specialist :    Anticipated Date of Discharge: 3-4 days   Anticipated Disposition (home, SNF) : HH    Total Time Coordinating Admission:  71   minutes      ( Dragon medical dictation software was used for portions of this report.  Unintended voice transcription errors may have occurred.)  ___________________________________________________  Admitting Physician: SERGIO CALLANDER, MD   "

## 2024-12-10 NOTE — ED Notes (Signed)
"  TRANSFER - OUT REPORT:    Verbal report given to [place], RN on [place] being transferred to 5227 for routine progression of patient care       Report consisted of patient's Situation, Background, Assessment and   Recommendations(SBAR).     Information from the following report(s) ED SBAR, MAR, and Neuro Assessment was reviewed with the receiving nurse.  Kinder Assessment: No data recorded  Lines:   Peripheral IV 12/10/24 Left Antecubital (Active)   Site Assessment Clean, dry & intact 12/10/24 1024   Line Status Blood return noted;Flushed;Normal saline locked 12/10/24 1024   Phlebitis Assessment No symptoms 12/10/24 1024   Infiltration Assessment 0 12/10/24 1024        Medication(s) sent with patient from pharmacy and placed in tamper-evident security bag: None to send  If 'Yes,' list the name(s) of the medication(s): N/A    Patient belongings: Belongings sent to floor    Opportunity for questions and clarification was provided.      Patient transported with:  Monitor and Registered Nurse           Shona Agent, RN  12/10/24 1559    "

## 2024-12-10 NOTE — ED Triage Notes (Signed)
"  Rs from   sent for episode of N/V and lethargy. Pt responsive to pain   States she has CP     BS 151    Hx of stroke, global weakness and aphasia at baseline   "

## 2024-12-10 NOTE — ED Notes (Signed)
"  Per daughter at bedside, pt has been sleeping more over the past year and has become more lethargic since her stroke on 12/15.      Shona Agent, RN  12/10/24 1053    "

## 2024-12-10 NOTE — ED Notes (Signed)
"  Attempted to call report at this time, RN unavailable to receive report. Clerk asked this RN to call back in 10 minutes. This RN to call back shortly.      Shona Agent, RN  12/10/24 1544    "

## 2024-12-10 NOTE — ED Notes (Signed)
"  Daughter at bedside also brought in DNR paperwork. Paperwork labeled with pt labels and scanned into pt chart by ED clerk. DNR paperwork then returned to daughter.      Shona Agent, RN  12/10/24 1101    "

## 2024-12-10 NOTE — Progress Notes (Signed)
"  In conversation with patients dtr she states that plan is for medical stabilization and return to a.c.o.c to finish snf   "

## 2024-12-10 NOTE — Care Coordination (Signed)
 "[facility]   Care Manager Initial Assessment     Patient Name: Emily Henson     MRN: [id number]   Age: 89 y.o.   Sex: female                   Primary Care Physician Cephas Cletis SAUNDERS, MD  Unit/Room: 5227/5227  Admit Date: 12/10/2024   Length of stay: 0  Admitting Diagnosis: Acute encephalopathy [G93.40]  Acute metabolic encephalopathy [G93.41]  Nausea and vomiting, unspecified vomiting type [R11.2]   Primary Payer [employer] [place]  Current Attending Physician: Onita Pill, MD     Living and Support Situation prior to admission: dtr    As applicable, older adult social vulnerability and social determinants of health (SDOH) screening was completed during this assessment, including evaluation of social isolation, economic insecurity, access to healthcare, caregiver stress, and elder abuse, to identify needs for care plan modification.  Home environment, support system, and caregiving resources prior to admission were reviewed and documented.    Patient's Current Plan for Discharge: back to snf a.c.o.c. when stable     DME Prior to Admission: w/c seeing cane    Patient Information Verification    Home address, phone number, insurance, and emergency contacts verified: yes  Primary Care Provider (PCP): Cephas Cletis SAUNDERS, MD  Patient Confirmed PCP Visit in Past Year: yes    Medication / Pharmacy    Agreeable to [facility] for discharge medications: yes    Specialty Services Prior to Admission    Current Dialysis Patient: no  Current services prior to admission: yes    Discharge Planning / Post-Acute Care Preferences    Anticipated need for LTC facility or services: no  Active Medicaid: no  Agreeable to home health if recommended: yes  Agreeable to post-acute care facility if recommended: yes      Signed:   BERNIDA ROAN, RN , Case Manager  December 10, 2024  5:45 PM  Department Phone: [phone]     12/10/24 1523   Service Assessment   Information Provided By Child/Family   Patient  Orientation Unable to Assess   Cognition Short Term Memory Deficit   Primary Caregiver Other (Comment)  (snf)   Support Systems Children   Patient's Healthcare Decision Maker is: Legal Next of Kin   PCP Verified by CM Yes   Prior Functional Level Assistance with the following:;Bathing;Dressing;Toileting;Feeding;Mobility   History of falls? 1   Current Functional Level at Time of Initial Assessment Assistance with the following:;Bathing;Dressing;Toileting;Feeding;Mobility   Can patient return to prior living arrangement Yes   Ability to make needs known: Poor   Family able to assist with home care needs: Yes  (dtr)   Receives Help From [facility]   Current Community Resources None   Social/Functional History   Prior Level of Assist for ADLs Needs assistance   Prior Level of Assist for [employer] Needs assistance   Prior Level of Assist for Transfers Needs assistance   Ambulation Assistance Non-ambulatory   Active Driver No   Occupation Retired   Surveyor, Minerals Prior to Acute Admission Skilled Nursing Facility   Lives With Other (Comment)   Current Services Prior To Admission Other (Comment)  (snf)   Current DME Prior to [employer] - Manual;Other (Comment)  (blind cane)   Potential Assistance Needed Other (Comment)  (tbd)   Potential DME Needed Other (Comment)  (tbd)   Potential Assistance Obtaining Medications No   Patient expects to  be discharged to: Skilled nursing facility   Home Access Level entry   Estate Agent At/After Discharge   [employer] Information Provided? No   Who will provide transportation at discharge? Other (see comment)  international aid/development worker)   Condition of Participation: Discharge Planning   The Patient and/or Patient Representative was provided with a Choice of Provider? Patient Representative   Name of the Patient Representative who was provided with the Choice of Provider and agrees with the Discharge Plan?  dtr   The Patient and/Or Patient  Representative agree with the Discharge Plan? Yes       "

## 2024-12-11 LAB — EKG 12-LEAD
Atrial Rate: 63 {beats}/min
Calculated R Axis: -70 degrees
Calculated T Axis: 97 degrees
Q-T Interval: 534 ms
QRS Duration: 196 ms
QTC Calculation (Bezet): 534 ms
Ventricular Rate: 60 {beats}/min

## 2024-12-11 LAB — CBC WITH AUTO DIFFERENTIAL
Basophils: 0.2 % (ref 0–3)
Eosinophils: 0.8 % (ref 0–5)
Hematocrit: 44.4 % (ref 35.0–47.0)
Hemoglobin: 14.8 g/dL (ref 11.0–16.0)
Immature Granulocytes %: 0.2 % (ref 0.0–3.0)
Lymphocytes: 11.5 % — ABNORMAL LOW (ref 28–48)
MCH: 31 pg (ref 25.4–34.6)
MCHC: 33.3 g/dL (ref 30.0–36.0)
MCV: 93.1 fL (ref 80.0–98.0)
MPV: 11.5 fL — ABNORMAL HIGH (ref 6.0–10.0)
Monocytes: 11.4 % (ref 1–13)
Neutrophils Segmented: 75.9 % — ABNORMAL HIGH (ref 34–64)
Nucleated RBCs: 0 (ref 0–0)
Platelets: 199 1000/mm3 (ref 140–450)
RBC: 4.77 M/uL (ref 3.60–5.20)
RDW: 45.2 (ref 36.4–46.3)
WBC: 6.4 1000/mm3 (ref 4.0–11.0)

## 2024-12-11 LAB — COMPREHENSIVE METABOLIC PANEL W/ REFLEX TO MG FOR LOW K
ALT: 17 U/L (ref 10–49)
AST: 26 U/L (ref 0.0–33.9)
Albumin: 3.3 g/dL — ABNORMAL LOW (ref 3.4–5.0)
Alkaline Phosphatase: 44 U/L — ABNORMAL LOW (ref 46–116)
Anion Gap: 9 mmol/L (ref 5–15)
BUN: 7 mg/dL — ABNORMAL LOW (ref 9–23)
CO2: 29 meq/L (ref 20–31)
Calcium: 10 mg/dL (ref 8.7–10.4)
Chloride: 101 meq/L (ref 98–107)
Creatinine: 0.79 mg/dL (ref 0.55–1.02)
Glucose: 91 mg/dL (ref 74–106)
Potassium: 4.1 meq/L (ref 3.5–5.1)
Sodium: 139 meq/L (ref 136–145)
Total Bilirubin: 0.6 mg/dL (ref 0.30–1.20)
Total Protein: 6.2 g/dL (ref 5.7–8.2)

## 2024-12-11 LAB — CULTURE, URINE: Culture Result: NO GROWTH

## 2024-12-11 MED FILL — METOPROLOL TARTRATE 50 MG PO TABS: 50 mg | ORAL | Qty: 2 | Fill #0

## 2024-12-11 MED FILL — MEMANTINE HCL 5 MG PO TABS: 5 mg | ORAL | Qty: 2 | Fill #0

## 2024-12-11 MED FILL — SODIUM CHLORIDE FLUSH 0.9 % IV SOLN: 0.9 % | INTRAVENOUS | Qty: 10 | Fill #0

## 2024-12-11 MED FILL — DONEPEZIL HCL 5 MG PO TABS: 5 mg | ORAL | Qty: 2 | Fill #0

## 2024-12-11 MED FILL — ACETAMINOPHEN 325 MG PO TABS: 325 mg | ORAL | Qty: 2 | Fill #0

## 2024-12-11 MED FILL — ELIQUIS 5 MG PO TABS: 5 mg | ORAL | Qty: 1 | Fill #0

## 2024-12-11 NOTE — Progress Notes (Signed)
 "                                                                  INTERNAL MEDICINE                                                                   Daily Progress Note    Patient:  Emily Henson  Today date: December 11, 2024  Date of Admission:  12/10/2024    Interval History and Events of the last 24 hours:  Patient is more awake, according to the family back to the baseline.  Denies chest pain or dyspnea      Assessment and Plan:       Patient seen in follow up for multiple medical problems as listed below :    Acute encephalopathy  Head CT did not show acute change  UA and urine culture  Chest x-ray no infiltrate  Hold off the sedation medicatio  IV fluid hydration  monitor mental status  Mental status back to baseline according to the family at the bedside   TSH within normal limit to thiamine level   Resume the previous diet.  Soft and bite sized      Recently acute CVA  On anticoagulation  monitor mental status  PT OT eval  Continue Eliquis      Chronic atrial fibrillation  Continue metoprolol  for rate control  Continue anticoagulation with Eliquis      Alzheimer disease dementia  Monitor mental status  Patient to take Aricept  and Namenda  at home  Continue current medication     Lactic acidosis trending lactic acid and with IV fluid     CAD continue statin and beta-blocker     Hyperlipidemia on statin at home  Hypertension continue metoprolol  monitor pressure closely  Vitamin B12 deficiency vitamin B12 replacement            DVT Prophylaxis:    Continue home regimen for otherwise chronic, stable medical conditions as noted above. Discussed with patient and or family regarding management, prognosis, treatment and complications of the patient's medical conditions in detail. All questions answered to the satisfaction of those individual(s) who also verbalized understanding of and agreement with the assessment and plan.     Code Status:  .DNR     Disposition and Family:   Recommend to continue hospitalization.  Discussed with patient and nursing staff.    Anticipated Date of Discharge: 2-3 days   Anticipated Disposition (home, SNF) : NH    ROS     As H&P and Above  Labwork and Ancillary Studies     All labs/tests/imaging reviewed.Spoke with the nurse regarding patient issues.    Labwork:    CBC w/Diff   Lab Results   Component Value Date/Time    WBC 6.4 12/11/2024 09:02 AM    HGB 14.8 12/11/2024 09:02 AM    HCT 44.4 12/11/2024 09:02 AM    PLT 199 12/11/2024 09:02 AM    MCV 93.1 12/11/2024 09:02 AM  Basic Metabolic Profile   Lab Results   Component Value Date/Time    NA 139 12/11/2024 09:02 AM    K 4.1 12/11/2024 09:02 AM    CL 101 12/11/2024 09:02 AM    CO2 29 12/11/2024 09:02 AM    BUN 7 12/11/2024 09:02 AM    GFRAA >60.0 12/10/2024 10:18 AM        Cardiac Enzymes   No results found for: CPK, BNP   Arterial Blood Gases   @BRIEFLAB24 (ph,phi,pco2,pco2i,po2,po2i,hco3,hco3i,fio2,fio2i)@   Coagulation    @BRIEFLAB24 (PTTP,APTT,PTP,INR,INRT)@   Hepatic Function   No results found for: GGT, GGTP         Xray Result (most recent):  XR CHEST PORTABLE 12/10/2024    Narrative  PROVIDED HISTORY: Chest Pain  Additional information: None.    COMPARISON: None.    FINDINGS:  Portable frontal radiograph of the chest. One view, one exposure(s) total. The  patient is moderately rotated to the left, obscuring the left lung base. A left  subclavian approach pacemaker is in expected position. There is no focal  airspace opacity. No pleural effusion or pneumothorax is evident. The  cardiomediastinal silhouette is normal. There are no displaced rib fractures or  aggressive bone lesions.    Impression  IMPRESSION:  1.  No focal airspace opacity.  2.  Note that the patient is moderately rotated, reducing sensitivity for  detection of pathology in the mediastinum and left lung base.    Electronically signed by: Morton Riches, MD 12/10/2024 11:39 AM EST  Workstation ID: [id number]           MRI Result (most recent):  No results  found for this or any previous visit from the past 3650 days.       CT Result (most recent):  CT HEAD WO CONTRAST 12/10/2024    Narrative  HISTORY: ams, vomiting    TECHNIQUE: Helical acquisition of CT images through the head without contrast  with multiplanar reformats.    COMPARISON: None.    FINDINGS:  Streak artifact from dental amalgam degrades image quality in the brainstem,  posterior fossa, and occipital lobes.    Global parenchymal involutional changes are identified with associated sulcal  and ventricular prominence appropriate for the patient's age. Multifocal  confluent periventricular and subcortical white matter hypodensities are  nonspecific but most commonly related to chronic microvascular ischemic disease.  The basal cisterns are clear. Gray-white matter differentiation is intact. There  is no CT evidence of acute infarction. There is no acute intracranial  hemorrhage, mass, mass effect, or hydrocephalus. Ill-defined hypodensity in the  right cerebellar hemisphere is not well evaluated due to streak artifact but  could represent encephalomalacia from prior infarct.    Osseous structures are intact. Visualized portions of the mastoid air cells and  paranasal sinuses are clear. Note is made of bilateral lens replacements. Orbits  appear otherwise grossly unremarkable.    Impression  IMPRESSION:  1.  No evidence of an acute intracranial process. If there is high clinical  concern for acute infarction, MRI could be considered for further evaluation as  it is a more sensitive examination.  2.  Chronic atrophic and microvascular ischemic changes.  3.  Poorly defined hypodensity in the right cerebellar hemisphere; degraded by  streak artifact from dental amalgam. Likely, this represents encephalomalacia in  the setting of prior infarct. Attention on MRI, if obtained.    Electronically signed by: Morton Riches, MD 12/10/2024 11:03 AM EST  Workstation ID: [id number]  Current Inpatient Meds and  Allergies     Medications:  Current Facility-Administered Medications   Medication Dose Route Frequency    apixaban  (ELIQUIS ) tablet 5 mg  5 mg Oral BID    donepezil  (ARICEPT ) tablet 10 mg  10 mg Oral Daily    memantine  (NAMENDA ) tablet 10 mg  10 mg Oral Daily    metoprolol  tartrate (LOPRESSOR ) tablet 100 mg  100 mg Oral BID    sodium chloride  flush 0.9 % injection 5-40 mL  5-40 mL IntraVENous 2 times per day    sodium chloride  flush 0.9 % injection 5-40 mL  5-40 mL IntraVENous PRN    0.9 % sodium chloride  infusion   IntraVENous PRN    potassium chloride  (KLOR-CON  M) extended release tablet 40 mEq  40 mEq Oral PRN    Or    potassium chloride  20 MEQ/15ML (10%) oral solution 40 mEq  40 mEq Oral PRN    Or    potassium chloride  10 mEq/100 mL IVPB (Peripheral Line)  10 mEq IntraVENous PRN    magnesium  sulfate 2000 mg in 50 mL IVPB premix  2,000 mg IntraVENous PRN    ondansetron  (ZOFRAN -ODT) disintegrating tablet 4 mg  4 mg Oral Q8H PRN    Or    ondansetron  (ZOFRAN ) injection 4 mg  4 mg IntraVENous Q6H PRN    polyethylene glycol (GLYCOLAX ) packet 17 g  17 g Oral Daily PRN    acetaminophen  (TYLENOL ) tablet 650 mg  650 mg Oral Q6H PRN    Or    acetaminophen  (TYLENOL ) suppository 650 mg  650 mg Rectal Q6H PRN    dextrose  5 % and 0.45 % sodium chloride  infusion   IntraVENous Continuous          Objective:       BP (!) 142/88   Pulse 93   Temp 99.7 F (37.6 C) (Temporal)   Resp 18   Ht 1.651 m (5' 5)   Wt 79.8 kg (176 lb)   SpO2 93%   BMI 29.29 kg/m   Body mass index is 29.29 kg/m.      Intake/Output Summary (Last 24 hours) at 12/11/2024 1435  Last data filed at 12/11/2024 0427  Gross per 24 hour   Intake 515 ml   Output 480 ml   Net 35 ml       General:   Alert, cooperative, no distress, appears stated age.    Lungs:    Clear, no  wheeze, rhonchi, rales   Chest wall:   No tenderness or deformity.    Heart:   Regular rate and rhythm, S1, S2 normal, no murmur, click, rub or gallop.    Abdomen:    Soft, non-tender.  Bowel sounds normal. No masses,  No organomegaly.        Musculoskeleta : Normal range of motion in most of the joints   Extremities:  Extremities normal, atraumatic, no cyanosis or edema.    Pulses:  2+ and symmetric all extremities.    Skin:  Skin color, texture, turgor normal. No rashes or lesions    Neurologic  Psych:  : CNII-XII intact.   Awake, confused. No thoughts of harm to self or others              Total time spent with chart review, patient examination/education, discussion with staff on case,documentation and medication management / adjustment:   >50 minutes    It is always a pleasure to be involved in the clinical care of  this patient.    Print [employer]anner medical dictation software was used for portions of this report.  Unintended voice transcription errors may have occurred.)    SERGIO CALLANDER, MD  December 11, 2024  [facility] Internal Medicine  Pager:  [phone]  "

## 2024-12-12 MED ORDER — DEXTROSE-SODIUM CHLORIDE 5-0.45 % IV SOLN
5-0.45 | INTRAVENOUS | Status: DC
Start: 2024-12-12 — End: 2024-12-14
  Administered 2024-12-12 – 2024-12-13 (×2): via INTRAVENOUS

## 2024-12-12 MED FILL — MEMANTINE HCL 5 MG PO TABS: 5 mg | ORAL | Qty: 2 | Fill #0

## 2024-12-12 MED FILL — ELIQUIS 5 MG PO TABS: 5 mg | ORAL | Qty: 1 | Fill #0

## 2024-12-12 MED FILL — SODIUM CHLORIDE FLUSH 0.9 % IV SOLN: 0.9 % | INTRAVENOUS | Qty: 10 | Fill #0

## 2024-12-12 MED FILL — METOPROLOL TARTRATE 50 MG PO TABS: 50 mg | ORAL | Qty: 2 | Fill #0

## 2024-12-12 MED FILL — DONEPEZIL HCL 5 MG PO TABS: 5 mg | ORAL | Qty: 2 | Fill #0

## 2024-12-12 MED FILL — DEXTROSE-SODIUM CHLORIDE 5-0.45 % IV SOLN: 5-0.45 % | INTRAVENOUS | Qty: 1000 | Fill #0

## 2024-12-12 NOTE — Plan of Care (Signed)
"    Problem: Chronic Conditions and Co-morbidities  Goal: Patient's chronic conditions and co-morbidity symptoms are monitored and maintained or improved  Outcome: Progressing     Problem: Safety - Adult  Goal: Free from fall injury  12/12/2024 0811 by Okey Elyn HERO, RN  Outcome: Progressing  12/12/2024 0528 by Orpah Ludie DASEN, RN  Outcome: Progressing     Problem: Pain  Goal: Verbalizes/displays adequate comfort level or baseline comfort level  Outcome: Progressing     Problem: Skin/Tissue Integrity  Goal: Skin integrity remains intact  Description: 1.  Monitor for areas of redness and/or skin breakdown  2.  Assess vascular access sites hourly  3.  Every 4-6 hours minimum:  Change oxygen saturation probe site  4.  Every 4-6 hours:  If on nasal continuous positive airway pressure, respiratory therapy assess nares and determine need for appliance change or resting period  Outcome: Progressing     "

## 2024-12-12 NOTE — Progress Notes (Signed)
 "                                                                  INTERNAL MEDICINE                                                                   Daily Progress Note    Patient:  Emily Henson  Today date: December 12, 2024  Date of Admission:  12/10/2024    Interval History and Events of the last 24 hours:  More sleeping this morning, daughter stated the aricept  is supposed to be taken at night.  Will change Aricept  to night    Assessment and Plan:       Patient seen in follow up for multiple medical problems as listed below :    Acute encephalopathy  Head CT did not show acute change  UA and urine culture  Chest x-ray no infiltrate  Hold off the sedation medicatio  IV fluid hydration  monitor mental status  Mental status back to baseline according to the family at the bedside   TSH within normal limit to thiamine level   Resume the previous diet.  Soft and bite sized      Recently acute CVA  On anticoagulation  monitor mental status  PT OT eval  Continue Eliquis      Chronic atrial fibrillation  Continue metoprolol  for rate control  Continue anticoagulation with Eliquis      Alzheimer disease dementia  Monitor mental status  Patient to take Aricept  and Namenda  at home  Continue current medication     Lactic acidosis trending lactic acid and with IV fluid     CAD continue statin and beta-blocker     Hyperlipidemia on statin at home  Hypertension continue metoprolol  monitor pressure closely  Vitamin B12 deficiency vitamin B12 replacement        Not discussed with the daughter    DVT Prophylaxis:    Continue home regimen for otherwise chronic, stable medical conditions as noted above. Discussed with patient and or family regarding management, prognosis, treatment and complications of the patient's medical conditions in detail. All questions answered to the satisfaction of those individual(s) who also verbalized understanding of and agreement with the assessment and plan.     Code Status:  .DNR     Disposition and  Family:   Recommend to continue hospitalization. Discussed with patient and nursing staff.    Anticipated Date of Discharge: 2-3 days   Anticipated Disposition (home, SNF) : NH    ROS     As H&P and Above  Labwork and Ancillary Studies     All labs/tests/imaging reviewed.Spoke with the nurse regarding patient issues.    Labwork:    CBC w/Diff   Lab Results   Component Value Date/Time    WBC 6.4 12/11/2024 09:02 AM    HGB 14.8 12/11/2024 09:02 AM    HCT 44.4 12/11/2024 09:02 AM    PLT 199 12/11/2024 09:02 AM    MCV 93.1 12/11/2024 09:02 AM  Basic Metabolic Profile   Lab Results   Component Value Date/Time    NA 139 12/11/2024 09:02 AM    K 4.1 12/11/2024 09:02 AM    CL 101 12/11/2024 09:02 AM    CO2 29 12/11/2024 09:02 AM    BUN 7 12/11/2024 09:02 AM    GFRAA >60.0 12/10/2024 10:18 AM        Cardiac Enzymes   No results found for: CPK, BNP   Arterial Blood Gases   @BRIEFLAB24 (ph,phi,pco2,pco2i,po2,po2i,hco3,hco3i,fio2,fio2i)@   Coagulation    @BRIEFLAB24 (PTTP,APTT,PTP,INR,INRT)@   Hepatic Function   No results found for: GGT, GGTP         Xray Result (most recent):  XR CHEST PORTABLE 12/10/2024    Narrative  PROVIDED HISTORY: Chest Pain  Additional information: None.    COMPARISON: None.    FINDINGS:  Portable frontal radiograph of the chest. One view, one exposure(s) total. The  patient is moderately rotated to the left, obscuring the left lung base. A left  subclavian approach pacemaker is in expected position. There is no focal  airspace opacity. No pleural effusion or pneumothorax is evident. The  cardiomediastinal silhouette is normal. There are no displaced rib fractures or  aggressive bone lesions.    Impression  IMPRESSION:  1.  No focal airspace opacity.  2.  Note that the patient is moderately rotated, reducing sensitivity for  detection of pathology in the mediastinum and left lung base.    Electronically signed by: Morton Riches, MD 12/10/2024 11:39 AM EST  Workstation ID: [id number]            MRI Result (most recent):  No results found for this or any previous visit from the past 3650 days.       CT Result (most recent):  CT HEAD WO CONTRAST 12/10/2024    Narrative  HISTORY: ams, vomiting    TECHNIQUE: Helical acquisition of CT images through the head without contrast  with multiplanar reformats.    COMPARISON: None.    FINDINGS:  Streak artifact from dental amalgam degrades image quality in the brainstem,  posterior fossa, and occipital lobes.    Global parenchymal involutional changes are identified with associated sulcal  and ventricular prominence appropriate for the patient's age. Multifocal  confluent periventricular and subcortical white matter hypodensities are  nonspecific but most commonly related to chronic microvascular ischemic disease.  The basal cisterns are clear. Gray-white matter differentiation is intact. There  is no CT evidence of acute infarction. There is no acute intracranial  hemorrhage, mass, mass effect, or hydrocephalus. Ill-defined hypodensity in the  right cerebellar hemisphere is not well evaluated due to streak artifact but  could represent encephalomalacia from prior infarct.    Osseous structures are intact. Visualized portions of the mastoid air cells and  paranasal sinuses are clear. Note is made of bilateral lens replacements. Orbits  appear otherwise grossly unremarkable.    Impression  IMPRESSION:  1.  No evidence of an acute intracranial process. If there is high clinical  concern for acute infarction, MRI could be considered for further evaluation as  it is a more sensitive examination.  2.  Chronic atrophic and microvascular ischemic changes.  3.  Poorly defined hypodensity in the right cerebellar hemisphere; degraded by  streak artifact from dental amalgam. Likely, this represents encephalomalacia in  the setting of prior infarct. Attention on MRI, if obtained.    Electronically signed by: Morton Riches, MD 12/10/2024 11:03 AM EST  Workstation ID:  [id number]  Current Inpatient Meds and Allergies     Medications:  Current Facility-Administered Medications   Medication Dose Route Frequency    apixaban  (ELIQUIS ) tablet 5 mg  5 mg Oral BID    donepezil  (ARICEPT ) tablet 10 mg  10 mg Oral Daily    memantine  (NAMENDA ) tablet 10 mg  10 mg Oral Daily    metoprolol  tartrate (LOPRESSOR ) tablet 100 mg  100 mg Oral BID    sodium chloride  flush 0.9 % injection 5-40 mL  5-40 mL IntraVENous 2 times per day    sodium chloride  flush 0.9 % injection 5-40 mL  5-40 mL IntraVENous PRN    0.9 % sodium chloride  infusion   IntraVENous PRN    potassium chloride  (KLOR-CON  M) extended release tablet 40 mEq  40 mEq Oral PRN    Or    potassium chloride  20 MEQ/15ML (10%) oral solution 40 mEq  40 mEq Oral PRN    Or    potassium chloride  10 mEq/100 mL IVPB (Peripheral Line)  10 mEq IntraVENous PRN    magnesium  sulfate 2000 mg in 50 mL IVPB premix  2,000 mg IntraVENous PRN    ondansetron  (ZOFRAN -ODT) disintegrating tablet 4 mg  4 mg Oral Q8H PRN    Or    ondansetron  (ZOFRAN ) injection 4 mg  4 mg IntraVENous Q6H PRN    polyethylene glycol (GLYCOLAX ) packet 17 g  17 g Oral Daily PRN    acetaminophen  (TYLENOL ) tablet 650 mg  650 mg Oral Q6H PRN    Or    acetaminophen  (TYLENOL ) suppository 650 mg  650 mg Rectal Q6H PRN    dextrose  5 % and 0.45 % sodium chloride  infusion   IntraVENous Continuous          Objective:       BP 128/81   Pulse 75   Temp 97.9 F (36.6 C) (Temporal)   Resp 14   Ht 1.651 m (5' 5)   Wt 79.8 kg (176 lb)   SpO2 (!) 84%   BMI 29.29 kg/m   Body mass index is 29.29 kg/m.      Intake/Output Summary (Last 24 hours) at 12/12/2024 1226  Last data filed at 12/12/2024 1035  Gross per 24 hour   Intake 1250 ml   Output 1300 ml   Net -50 ml       General:   Sleepy cooperative, no distress, appears stated age.    Lungs:    Clear, no  wheeze, rhonchi, rales   Chest wall:   No tenderness or deformity.    Heart:   Regular rate and rhythm, S1, S2 normal, no murmur, click,  rub or gallop.    Abdomen:    Soft, non-tender. Bowel sounds normal. No masses,  No organomegaly.        Musculoskeleta : Normal range of motion in most of the joints   Extremities:  Extremities normal, atraumatic, no cyanosis or edema.    Pulses:  2+ and symmetric all extremities.    Skin:  Skin color, texture, turgor normal. No rashes or lesions    Neurologic  Psych:  : CNII-XII intact.   Lethargic confused. No thoughts of harm to self or others              Total time spent with chart review, patient examination/education, discussion with staff on case,documentation and medication management / adjustment:   >35 minutes    It is always a pleasure to be involved in the clinical care of this  patient.    [employer] medical dictation software was used for portions of this report.  Unintended voice transcription errors may have occurred.)    SERGIO CALLANDER, MD  December 12, 2024  [facility] Internal Medicine  Pager:  [phone]  "

## 2024-12-13 LAB — LACTIC ACID: Lactate: 1.3 mmol/L (ref 0.5–2.2)

## 2024-12-13 MED ORDER — DEXTROSE-SODIUM CHLORIDE 5-0.45 % IV SOLN
5-0.45 | INTRAVENOUS | Status: DC
Start: 2024-12-13 — End: 2024-12-16
  Administered 2024-12-13 – 2024-12-16 (×4): via INTRAVENOUS

## 2024-12-13 MED FILL — METOPROLOL TARTRATE 50 MG PO TABS: 50 mg | ORAL | Qty: 2 | Fill #0

## 2024-12-13 MED FILL — ELIQUIS 5 MG PO TABS: 5 mg | ORAL | Qty: 1 | Fill #0

## 2024-12-13 MED FILL — DEXTROSE-SODIUM CHLORIDE 5-0.45 % IV SOLN: 5-0.45 % | INTRAVENOUS | Qty: 1000 | Fill #0

## 2024-12-13 MED FILL — MEMANTINE HCL 5 MG PO TABS: 5 mg | ORAL | Qty: 2 | Fill #0

## 2024-12-13 MED FILL — DONEPEZIL HCL 5 MG PO TABS: 5 mg | ORAL | Qty: 2 | Fill #0

## 2024-12-13 MED FILL — SODIUM CHLORIDE FLUSH 0.9 % IV SOLN: 0.9 % | INTRAVENOUS | Qty: 10 | Fill #0

## 2024-12-13 NOTE — Progress Notes (Addendum)
 "                                                                  INTERNAL MEDICINE                                                                   Daily Progress Note    Patient:  Emily Henson  Today date: December 13, 2024  Date of Admission:  12/10/2024    Interval History and Events of the last 24 hours:  Resting on the bed comfortable. no specific complaints.  Open eyes   the daughter stated the aricept  is supposed to be taken at night.  Will change Aricept  to night, poor appetite    Assessment and Plan:       Patient seen in follow up for multiple medical problems as listed below :    Acute encephalopathy  Head CT did not show acute change  UA and urine culture  Chest x-ray no infiltrate  Hold off the sedation medicatio  IV fluid hydration  monitor mental status  Mental status back to baseline according to the family at the bedside   TSH within normal limit to thiamine level   Resume the previous diet.  Soft and bite sized , aspiration precaution  IV fluids hydration     Recently acute CVA  On anticoagulation  monitor mental status  PT OT eval  Continue Eliquis      Chronic atrial fibrillation  Continue metoprolol  for rate control  Continue Eliquis      Alzheimer disease dementia  Monitor mental status  Patient to take Aricept  and Namenda  at home  Change Aricept  at night  Continue current medication     Lactic acidosis trending lactic acid and with IV fluid     CAD continue statin and beta-blocker     Hyperlipidemia on statin at home  Hypertension continue metoprolol  monitor pressure closely  Vitamin B12 deficiency vitamin B12 replacement    I  discussed with the daughter at the bedside    DVT Prophylaxis:    Continue home regimen for otherwise chronic, stable medical conditions as noted above. Discussed with patient and or family regarding management, prognosis, treatment and complications of the patient's medical conditions in detail. All questions answered to the satisfaction of those individual(s) who  also verbalized understanding of and agreement with the assessment and plan.     Code Status:  .DNR     Disposition and Family:   Recommend to continue hospitalization. Discussed with patient and nursing staff.    Anticipated Date of Discharge: 2-3 days   Anticipated Disposition (home, SNF) : NH    ROS     As H&P and Above  Labwork and Ancillary Studies     All labs/tests/imaging reviewed.Spoke with the nurse regarding patient issues.    Labwork:    CBC w/Diff   Lab Results   Component Value Date/Time    WBC 6.4 12/11/2024 09:02 AM    HGB 14.8 12/11/2024 09:02 AM    HCT  44.4 12/11/2024 09:02 AM    PLT 199 12/11/2024 09:02 AM    MCV 93.1 12/11/2024 09:02 AM        Basic Metabolic Profile   Lab Results   Component Value Date/Time    NA 139 12/11/2024 09:02 AM    K 4.1 12/11/2024 09:02 AM    CL 101 12/11/2024 09:02 AM    CO2 29 12/11/2024 09:02 AM    BUN 7 12/11/2024 09:02 AM    GFRAA >60.0 12/10/2024 10:18 AM        Cardiac Enzymes   No results found for: CPK, BNP   Arterial Blood Gases   @BRIEFLAB24 (ph,phi,pco2,pco2i,po2,po2i,hco3,hco3i,fio2,fio2i)@   Coagulation    @BRIEFLAB24 (PTTP,APTT,PTP,INR,INRT)@   Hepatic Function   No results found for: GGT, GGTP         Xray Result (most recent):  XR CHEST PORTABLE 12/10/2024    Narrative  PROVIDED HISTORY: Chest Pain  Additional information: None.    COMPARISON: None.    FINDINGS:  Portable frontal radiograph of the chest. One view, one exposure(s) total. The  patient is moderately rotated to the left, obscuring the left lung base. A left  subclavian approach pacemaker is in expected position. There is no focal  airspace opacity. No pleural effusion or pneumothorax is evident. The  cardiomediastinal silhouette is normal. There are no displaced rib fractures or  aggressive bone lesions.    Impression  IMPRESSION:  1.  No focal airspace opacity.  2.  Note that the patient is moderately rotated, reducing sensitivity for  detection of pathology in the mediastinum and  left lung base.    Electronically signed by: Morton Riches, MD 12/10/2024 11:39 AM EST  Workstation ID: [id number]           MRI Result (most recent):  No results found for this or any previous visit from the past 3650 days.       CT Result (most recent):  CT HEAD WO CONTRAST 12/10/2024    Narrative  HISTORY: ams, vomiting    TECHNIQUE: Helical acquisition of CT images through the head without contrast  with multiplanar reformats.    COMPARISON: None.    FINDINGS:  Streak artifact from dental amalgam degrades image quality in the brainstem,  posterior fossa, and occipital lobes.    Global parenchymal involutional changes are identified with associated sulcal  and ventricular prominence appropriate for the patient's age. Multifocal  confluent periventricular and subcortical white matter hypodensities are  nonspecific but most commonly related to chronic microvascular ischemic disease.  The basal cisterns are clear. Gray-white matter differentiation is intact. There  is no CT evidence of acute infarction. There is no acute intracranial  hemorrhage, mass, mass effect, or hydrocephalus. Ill-defined hypodensity in the  right cerebellar hemisphere is not well evaluated due to streak artifact but  could represent encephalomalacia from prior infarct.    Osseous structures are intact. Visualized portions of the mastoid air cells and  paranasal sinuses are clear. Note is made of bilateral lens replacements. Orbits  appear otherwise grossly unremarkable.    Impression  IMPRESSION:  1.  No evidence of an acute intracranial process. If there is high clinical  concern for acute infarction, MRI could be considered for further evaluation as  it is a more sensitive examination.  2.  Chronic atrophic and microvascular ischemic changes.  3.  Poorly defined hypodensity in the right cerebellar hemisphere; degraded by  streak artifact from dental amalgam. Likely, this represents encephalomalacia in  the setting of prior  infarct.  Attention on MRI, if obtained.    Electronically signed by: Morton Riches, MD 12/10/2024 11:03 AM EST  Workstation ID: [id number]         Current Inpatient Meds and Allergies     Medications:  Current Facility-Administered Medications   Medication Dose Route Frequency    dextrose  5 % and 0.45 % sodium chloride  infusion   IntraVENous Continuous    apixaban  (ELIQUIS ) tablet 5 mg  5 mg Oral BID    donepezil  (ARICEPT ) tablet 10 mg  10 mg Oral Daily    memantine  (NAMENDA ) tablet 10 mg  10 mg Oral Daily    metoprolol  tartrate (LOPRESSOR ) tablet 100 mg  100 mg Oral BID    sodium chloride  flush 0.9 % injection 5-40 mL  5-40 mL IntraVENous 2 times per day    sodium chloride  flush 0.9 % injection 5-40 mL  5-40 mL IntraVENous PRN    0.9 % sodium chloride  infusion   IntraVENous PRN    potassium chloride  (KLOR-CON  M) extended release tablet 40 mEq  40 mEq Oral PRN    Or    potassium chloride  20 MEQ/15ML (10%) oral solution 40 mEq  40 mEq Oral PRN    Or    potassium chloride  10 mEq/100 mL IVPB (Peripheral Line)  10 mEq IntraVENous PRN    magnesium  sulfate 2000 mg in 50 mL IVPB premix  2,000 mg IntraVENous PRN    ondansetron  (ZOFRAN -ODT) disintegrating tablet 4 mg  4 mg Oral Q8H PRN    Or    ondansetron  (ZOFRAN ) injection 4 mg  4 mg IntraVENous Q6H PRN    polyethylene glycol (GLYCOLAX ) packet 17 g  17 g Oral Daily PRN    acetaminophen  (TYLENOL ) tablet 650 mg  650 mg Oral Q6H PRN    Or    acetaminophen  (TYLENOL ) suppository 650 mg  650 mg Rectal Q6H PRN          Objective:       BP (!) 155/99   Pulse 58   Temp 97 F (36.1 C) (Temporal)   Resp 18   Ht 1.651 m (5' 5)   Wt 74.5 kg (164 lb 3.9 oz)   SpO2 98%   BMI 27.33 kg/m   Body mass index is 27.33 kg/m.      Intake/Output Summary (Last 24 hours) at 12/13/2024 0850  Last data filed at 12/13/2024 0531  Gross per 24 hour   Intake 888.33 ml   Output 1700 ml   Net -811.67 ml       General:   cooperative, no distress, appears stated age.    Lungs:    Clear, no  wheeze,  rhonchi, rales   Chest wall:   No tenderness or deformity.    Heart:   Regular rate and rhythm, S1, S2 normal, no murmur, click, rub or gallop.    Abdomen:    Soft, non-tender. Bowel sounds normal. No masses,  No organomegaly.        Musculoskeleta : Normal range of motion in most of the joints   Extremities:  Extremities normal, atraumatic, no cyanosis or edema.    Pulses:  2+ and symmetric all extremities.    Skin:  Skin color, texture, turgor normal. No rashes or lesions    Neurologic  Psych:  : CNII-XII intact.    confused. No thoughts of harm to self or others              Total time spent with chart review,  patient examination/education, discussion with staff on case,documentation and medication management / adjustment:   >35 minutes    It is always a pleasure to be involved in the clinical care of this patient.    [employer] medical dictation software was used for portions of this report.  Unintended voice transcription errors may have occurred.)    SERGIO CALLANDER, MD  December 13, 2024  [facility] Internal Medicine  Pager:  [phone]  "

## 2024-12-13 NOTE — Plan of Care (Signed)
"    Problem: Chronic Conditions and Co-morbidities  Goal: Patient's chronic conditions and co-morbidity symptoms are monitored and maintained or improved  Outcome: Progressing     Problem: Safety - Adult  Goal: Free from fall injury  Outcome: Progressing     Problem: Pain  Goal: Verbalizes/displays adequate comfort level or baseline comfort level  Outcome: Progressing     Problem: Skin/Tissue Integrity  Goal: Skin integrity remains intact  Description: 1.  Monitor for areas of redness and/or skin breakdown  2.  Assess vascular access sites hourly  3.  Every 4-6 hours minimum:  Change oxygen saturation probe site  4.  Every 4-6 hours:  If on nasal continuous positive airway pressure, respiratory therapy assess nares and determine need for appliance change or resting period  Outcome: Progressing     "

## 2024-12-14 MED ORDER — MELATONIN 3 MG PO TABS
3 | Freq: Every evening | ORAL | Status: DC | PRN
Start: 2024-12-14 — End: 2024-12-16
  Administered 2024-12-15 – 2024-12-16 (×2): 3 mg via ORAL

## 2024-12-14 MED FILL — ELIQUIS 5 MG PO TABS: 5 mg | ORAL | Qty: 1 | Fill #0

## 2024-12-14 MED FILL — MEMANTINE HCL 5 MG PO TABS: 5 mg | ORAL | Qty: 2 | Fill #0

## 2024-12-14 MED FILL — SODIUM CHLORIDE FLUSH 0.9 % IV SOLN: 0.9 % | INTRAVENOUS | Qty: 10 | Fill #0

## 2024-12-14 MED FILL — METOPROLOL TARTRATE 50 MG PO TABS: 50 mg | ORAL | Qty: 2 | Fill #0

## 2024-12-14 MED FILL — DONEPEZIL HCL 5 MG PO TABS: 5 mg | ORAL | Qty: 2 | Fill #0

## 2024-12-14 NOTE — Progress Notes (Signed)
 "                                                                  INTERNAL MEDICINE                                                                   Daily Progress Note    Patient:  Emily Henson  Today date: December 14, 2024  Date of Admission:  12/10/2024    Interval History and Events of the last 24 hours:  Episode tachycardia this morning improved after the metoprolol .  Resting on the bed comfortable. no specific complaints.  Open eyes.  Poor appetite    Assessment and Plan:       Patient seen in follow up for multiple medical problems as listed below :    Acute encephalopathy  Head CT did not show acute change  UA and urine culture  Chest x-ray no infiltrate  Hold off the sedation medicatio  IV fluid hydration  monitor mental status  Mental status back to baseline according to the family at the bedside   TSH within normal limit to thiamine level   Resume the previous diet.  Soft and bite sized , aspiration precaution  IV fluids hydration     Recently acute CVA  On anticoagulation  monitor mental status  PT OT eval  Continue Eliquis      Chronic atrial fibrillation  Episode  of rapid  atrial fibrillation improved with metoprolol   Continue metoprolol  for rate control  Continue Eliquis      Alzheimer disease dementia  Monitor mental status  Patient to take Aricept  and Namenda  at home  Continue Aricept  at night  Continue current medication     Lactic acidosis trending lactic acid and with IV fluid     CAD continue statin and beta-blocker     Hyperlipidemia on statin at home  Hypertension continue metoprolol  monitor pressure closely  Vitamin B12 deficiency vitamin B12 replacement    I  discussed with the daughter at the bedside    DVT Prophylaxis:    Continue home regimen for otherwise chronic, stable medical conditions as noted above. Discussed with patient and or family regarding management, prognosis, treatment and complications of the patient's medical conditions in detail. All questions answered to the  satisfaction of those individual(s) who also verbalized understanding of and agreement with the assessment and plan.     Code Status:  .DNR     Disposition and Family:   Recommend to continue hospitalization. Discussed with patient and nursing staff.    Anticipated Date of Discharge: 1-2 days   Anticipated Disposition (home, SNF) : NH    ROS     As H&P and Above  Labwork and Ancillary Studies     All labs/tests/imaging reviewed.Spoke with the nurse regarding patient issues.    Labwork:    CBC w/Diff   Lab Results   Component Value Date/Time    WBC 6.4 12/11/2024 09:02 AM    HGB 14.8 12/11/2024 09:02 AM  HCT 44.4 12/11/2024 09:02 AM    PLT 199 12/11/2024 09:02 AM    MCV 93.1 12/11/2024 09:02 AM        Basic Metabolic Profile   Lab Results   Component Value Date/Time    NA 139 12/11/2024 09:02 AM    K 4.1 12/11/2024 09:02 AM    CL 101 12/11/2024 09:02 AM    CO2 29 12/11/2024 09:02 AM    BUN 7 12/11/2024 09:02 AM    GFRAA >60.0 12/10/2024 10:18 AM        Cardiac Enzymes   No results found for: CPK, BNP   Arterial Blood Gases   @BRIEFLAB24 (ph,phi,pco2,pco2i,po2,po2i,hco3,hco3i,fio2,fio2i)@   Coagulation    @BRIEFLAB24 (PTTP,APTT,PTP,INR,INRT)@   Hepatic Function   No results found for: GGT, GGTP         Xray Result (most recent):  XR CHEST PORTABLE 12/10/2024    Narrative  PROVIDED HISTORY: Chest Pain  Additional information: None.    COMPARISON: None.    FINDINGS:  Portable frontal radiograph of the chest. One view, one exposure(s) total. The  patient is moderately rotated to the left, obscuring the left lung base. A left  subclavian approach pacemaker is in expected position. There is no focal  airspace opacity. No pleural effusion or pneumothorax is evident. The  cardiomediastinal silhouette is normal. There are no displaced rib fractures or  aggressive bone lesions.    Impression  IMPRESSION:  1.  No focal airspace opacity.  2.  Note that the patient is moderately rotated, reducing sensitivity  for  detection of pathology in the mediastinum and left lung base.    Electronically signed by: Morton Riches, MD 12/10/2024 11:39 AM EST  Workstation ID: [id number]           MRI Result (most recent):  No results found for this or any previous visit from the past 3650 days.       CT Result (most recent):  CT HEAD WO CONTRAST 12/10/2024    Narrative  HISTORY: ams, vomiting    TECHNIQUE: Helical acquisition of CT images through the head without contrast  with multiplanar reformats.    COMPARISON: None.    FINDINGS:  Streak artifact from dental amalgam degrades image quality in the brainstem,  posterior fossa, and occipital lobes.    Global parenchymal involutional changes are identified with associated sulcal  and ventricular prominence appropriate for the patient's age. Multifocal  confluent periventricular and subcortical white matter hypodensities are  nonspecific but most commonly related to chronic microvascular ischemic disease.  The basal cisterns are clear. Gray-white matter differentiation is intact. There  is no CT evidence of acute infarction. There is no acute intracranial  hemorrhage, mass, mass effect, or hydrocephalus. Ill-defined hypodensity in the  right cerebellar hemisphere is not well evaluated due to streak artifact but  could represent encephalomalacia from prior infarct.    Osseous structures are intact. Visualized portions of the mastoid air cells and  paranasal sinuses are clear. Note is made of bilateral lens replacements. Orbits  appear otherwise grossly unremarkable.    Impression  IMPRESSION:  1.  No evidence of an acute intracranial process. If there is high clinical  concern for acute infarction, MRI could be considered for further evaluation as  it is a more sensitive examination.  2.  Chronic atrophic and microvascular ischemic changes.  3.  Poorly defined hypodensity in the right cerebellar hemisphere; degraded by  streak artifact from dental amalgam. Likely, this represents  encephalomalacia in  the setting  of prior infarct. Attention on MRI, if obtained.    Electronically signed by: Morton Riches, MD 12/10/2024 11:03 AM EST  Workstation ID: [id number]         Current Inpatient Meds and Allergies     Medications:  Current Facility-Administered Medications   Medication Dose Route Frequency    dextrose  5 % and 0.45 % sodium chloride  infusion   IntraVENous Continuous    dextrose  5 % and 0.45 % sodium chloride  infusion   IntraVENous Continuous    apixaban  (ELIQUIS ) tablet 5 mg  5 mg Oral BID    donepezil  (ARICEPT ) tablet 10 mg  10 mg Oral Daily    memantine  (NAMENDA ) tablet 10 mg  10 mg Oral Daily    metoprolol  tartrate (LOPRESSOR ) tablet 100 mg  100 mg Oral BID    sodium chloride  flush 0.9 % injection 5-40 mL  5-40 mL IntraVENous 2 times per day    sodium chloride  flush 0.9 % injection 5-40 mL  5-40 mL IntraVENous PRN    0.9 % sodium chloride  infusion   IntraVENous PRN    potassium chloride  (KLOR-CON  M) extended release tablet 40 mEq  40 mEq Oral PRN    Or    potassium chloride  20 MEQ/15ML (10%) oral solution 40 mEq  40 mEq Oral PRN    Or    potassium chloride  10 mEq/100 mL IVPB (Peripheral Line)  10 mEq IntraVENous PRN    magnesium  sulfate 2000 mg in 50 mL IVPB premix  2,000 mg IntraVENous PRN    ondansetron  (ZOFRAN -ODT) disintegrating tablet 4 mg  4 mg Oral Q8H PRN    Or    ondansetron  (ZOFRAN ) injection 4 mg  4 mg IntraVENous Q6H PRN    polyethylene glycol (GLYCOLAX ) packet 17 g  17 g Oral Daily PRN    acetaminophen  (TYLENOL ) tablet 650 mg  650 mg Oral Q6H PRN    Or    acetaminophen  (TYLENOL ) suppository 650 mg  650 mg Rectal Q6H PRN          Objective:       BP (!) 150/93   Pulse 66   Temp 97.3 F (36.3 C) (Temporal)   Resp 17   Ht 1.651 m (5' 5)   Wt 74.5 kg (164 lb 3.9 oz)   SpO2 99%   BMI 27.33 kg/m   Body mass index is 27.33 kg/m.      Intake/Output Summary (Last 24 hours) at 12/14/2024 1122  Last data filed at 12/14/2024 9082  Gross per 24 hour   Intake 1860 ml    Output 1900 ml   Net -40 ml       General:   cooperative, no distress, appears stated age.    Lungs:    Clear, no  wheeze, rhonchi, rales   Chest wall:   No tenderness or deformity.    Heart:   Regular rate and rhythm, S1, S2 normal, no murmur, click, rub or gallop.    Abdomen:    Soft, non-tender. Bowel sounds normal. No masses,  No organomegaly.        Musculoskeleta : Normal range of motion in most of the joints   Extremities:  Extremities normal, atraumatic, no cyanosis or edema.    Pulses:  2+ and symmetric all extremities.    Skin:  Skin color, texture, turgor normal. No rashes or lesions    Neurologic  Psych:  : CNII-XII intact.    confused. No thoughts of harm to self or others  Total time spent with chart review, patient examination/education, discussion with staff on case,documentation and medication management / adjustment:   >35 minutes    It is always a pleasure to be involved in the clinical care of this patient.    [employer] medical dictation software was used for portions of this report.  Unintended voice transcription errors may have occurred.)    SERGIO CALLANDER, MD  December 14, 2024  [facility]  Pager:  [phone]  "

## 2024-12-15 MED FILL — METOPROLOL TARTRATE 50 MG PO TABS: 50 mg | ORAL | Qty: 2 | Fill #0

## 2024-12-15 MED FILL — DONEPEZIL HCL 5 MG PO TABS: 5 mg | ORAL | Qty: 2 | Fill #0

## 2024-12-15 MED FILL — SODIUM CHLORIDE FLUSH 0.9 % IV SOLN: 0.9 % | INTRAVENOUS | Qty: 10 | Fill #0

## 2024-12-15 MED FILL — MEMANTINE HCL 5 MG PO TABS: 5 mg | ORAL | Qty: 2 | Fill #0

## 2024-12-15 MED FILL — MELATONIN 3 MG PO TABS: 3 mg | ORAL | Qty: 1 | Fill #0

## 2024-12-15 MED FILL — ELIQUIS 5 MG PO TABS: 5 mg | ORAL | Qty: 1 | Fill #0

## 2024-12-15 NOTE — Plan of Care (Signed)
"    Problem: Safety - Adult  Goal: Free from fall injury  12/15/2024 1221 by Roselind Savant, RN  Outcome: Progressing  12/15/2024 0050 by Orpah Ludie DASEN, RN  Outcome: Progressing     "

## 2024-12-15 NOTE — Progress Notes (Signed)
 "                                                                  INTERNAL MEDICINE                                                                   Daily Progress Note    Patient:  Emily Henson  Today date: December 15, 2024  Date of Admission:  12/10/2024    Interval History and Events of the last 24 hours:  Blood pressure elevation,  resting on the bed comfortable. no specific complaints.  Open eyes.    Assessment and Plan:       Patient seen in follow up for multiple medical problems as listed below :    Acute encephalopathy  Head CT did not show acute change  UA and urine culture  Chest x-ray no infiltrate  Hold off the sedation medicatio  IV fluid hydration  monitor mental status  Mental status back to baseline according to the family at the bedside   TSH within normal limit to thiamine level   Resume the previous diet.  Soft and bite sized , aspiration precaution  Decrease IV fluids     Recently acute CVA  On anticoagulation  monitor mental status  PT OT eval  Continue Eliquis      Chronic atrial fibrillation  Episode  of rapid  atrial fibrillation improved with metoprolol   Continue metoprolol  for Rate control   Continue Eliquis      Alzheimer disease dementia  Monitor mental status  Patient to take Aricept  and Namenda  at home  Continue Aricept  at night  Continue current medication     Lactic acidosis trending lactic acid and with IV fluid     CAD continue statin and beta-blocker     Hyperlipidemia on statin at home  Hypertension continue metoprolol  monitor pressure closely  Vitamin B12 deficiency vitamin B12 replacement    I  discussed with the daughter at the bedside    DVT Prophylaxis:    Continue home regimen for otherwise chronic, stable medical conditions as noted above. Discussed with patient and or family regarding management, prognosis, treatment and complications of the patient's medical conditions in detail. All questions answered to the satisfaction of those individual(s) who also verbalized  understanding of and agreement with the assessment and plan.     Code Status:  .DNR     Disposition and Family:   Recommend to continue hospitalization. Discussed with patient and nursing staff.    Anticipated Date of Discharge: 1-2 days   Anticipated Disposition (home, SNF) : NH    ROS     As H&P and Above  Labwork and Ancillary Studies     All labs/tests/imaging reviewed.Spoke with the nurse regarding patient issues.    Labwork:    CBC w/Diff   Lab Results   Component Value Date/Time    WBC 6.4 12/11/2024 09:02 AM    HGB 14.8 12/11/2024 09:02 AM    HCT 44.4 12/11/2024 09:02 AM  PLT 199 12/11/2024 09:02 AM    MCV 93.1 12/11/2024 09:02 AM        Basic Metabolic Profile   Lab Results   Component Value Date/Time    NA 139 12/11/2024 09:02 AM    K 4.1 12/11/2024 09:02 AM    CL 101 12/11/2024 09:02 AM    CO2 29 12/11/2024 09:02 AM    BUN 7 12/11/2024 09:02 AM    GFRAA >60.0 12/10/2024 10:18 AM        Cardiac Enzymes   No results found for: CPK, BNP   Arterial Blood Gases   @BRIEFLAB24 (ph,phi,pco2,pco2i,po2,po2i,hco3,hco3i,fio2,fio2i)@   Coagulation    @BRIEFLAB24 (PTTP,APTT,PTP,INR,INRT)@   Hepatic Function   No results found for: GGT, GGTP         Xray Result (most recent):  XR CHEST PORTABLE 12/10/2024    Narrative  PROVIDED HISTORY: Chest Pain  Additional information: None.    COMPARISON: None.    FINDINGS:  Portable frontal radiograph of the chest. One view, one exposure(s) total. The  patient is moderately rotated to the left, obscuring the left lung base. A left  subclavian approach pacemaker is in expected position. There is no focal  airspace opacity. No pleural effusion or pneumothorax is evident. The  cardiomediastinal silhouette is normal. There are no displaced rib fractures or  aggressive bone lesions.    Impression  IMPRESSION:  1.  No focal airspace opacity.  2.  Note that the patient is moderately rotated, reducing sensitivity for  detection of pathology in the mediastinum and left lung  base.    Electronically signed by: Morton Riches, MD 12/10/2024 11:39 AM EST  Workstation ID: [id number]           MRI Result (most recent):  No results found for this or any previous visit from the past 3650 days.       CT Result (most recent):  CT HEAD WO CONTRAST 12/10/2024    Narrative  HISTORY: ams, vomiting    TECHNIQUE: Helical acquisition of CT images through the head without contrast  with multiplanar reformats.    COMPARISON: None.    FINDINGS:  Streak artifact from dental amalgam degrades image quality in the brainstem,  posterior fossa, and occipital lobes.    Global parenchymal involutional changes are identified with associated sulcal  and ventricular prominence appropriate for the patient's age. Multifocal  confluent periventricular and subcortical white matter hypodensities are  nonspecific but most commonly related to chronic microvascular ischemic disease.  The basal cisterns are clear. Gray-white matter differentiation is intact. There  is no CT evidence of acute infarction. There is no acute intracranial  hemorrhage, mass, mass effect, or hydrocephalus. Ill-defined hypodensity in the  right cerebellar hemisphere is not well evaluated due to streak artifact but  could represent encephalomalacia from prior infarct.    Osseous structures are intact. Visualized portions of the mastoid air cells and  paranasal sinuses are clear. Note is made of bilateral lens replacements. Orbits  appear otherwise grossly unremarkable.    Impression  IMPRESSION:  1.  No evidence of an acute intracranial process. If there is high clinical  concern for acute infarction, MRI could be considered for further evaluation as  it is a more sensitive examination.  2.  Chronic atrophic and microvascular ischemic changes.  3.  Poorly defined hypodensity in the right cerebellar hemisphere; degraded by  streak artifact from dental amalgam. Likely, this represents encephalomalacia in  the setting of prior infarct. Attention on  MRI, if obtained.  Electronically signed by: Morton Riches, MD 12/10/2024 11:03 AM EST  Workstation ID: [id number]         Current Inpatient Meds and Allergies     Medications:  Current Facility-Administered Medications   Medication Dose Route Frequency    melatonin tablet 3 mg  3 mg Oral Nightly PRN    dextrose  5 % and 0.45 % sodium chloride  infusion   IntraVENous Continuous    apixaban  (ELIQUIS ) tablet 5 mg  5 mg Oral BID    donepezil  (ARICEPT ) tablet 10 mg  10 mg Oral Daily    memantine  (NAMENDA ) tablet 10 mg  10 mg Oral Daily    metoprolol  tartrate (LOPRESSOR ) tablet 100 mg  100 mg Oral BID    sodium chloride  flush 0.9 % injection 5-40 mL  5-40 mL IntraVENous 2 times per day    sodium chloride  flush 0.9 % injection 5-40 mL  5-40 mL IntraVENous PRN    0.9 % sodium chloride  infusion   IntraVENous PRN    potassium chloride  (KLOR-CON  M) extended release tablet 40 mEq  40 mEq Oral PRN    Or    potassium chloride  20 MEQ/15ML (10%) oral solution 40 mEq  40 mEq Oral PRN    Or    potassium chloride  10 mEq/100 mL IVPB (Peripheral Line)  10 mEq IntraVENous PRN    magnesium  sulfate 2000 mg in 50 mL IVPB premix  2,000 mg IntraVENous PRN    ondansetron  (ZOFRAN -ODT) disintegrating tablet 4 mg  4 mg Oral Q8H PRN    Or    ondansetron  (ZOFRAN ) injection 4 mg  4 mg IntraVENous Q6H PRN    polyethylene glycol (GLYCOLAX ) packet 17 g  17 g Oral Daily PRN    acetaminophen  (TYLENOL ) tablet 650 mg  650 mg Oral Q6H PRN    Or    acetaminophen  (TYLENOL ) suppository 650 mg  650 mg Rectal Q6H PRN          Objective:       BP (!) 163/86   Pulse 62   Temp 98.2 F (36.8 C) (Temporal)   Resp 17   Ht 1.651 m (5' 5)   Wt 74.5 kg (164 lb 3.9 oz)   SpO2 96%   BMI 27.33 kg/m   Body mass index is 27.33 kg/m.      Intake/Output Summary (Last 24 hours) at 12/15/2024 1352  Last data filed at 12/15/2024 1217  Gross per 24 hour   Intake 1385 ml   Output 550 ml   Net 835 ml       General:   cooperative, no distress, appears stated age.     Lungs:    Clear, no  wheeze, rhonchi, rales   Chest wall:   No tenderness or deformity.    Heart:   Regular rate and rhythm, S1, S2 normal, no murmur, click, rub or gallop.    Abdomen:    Soft, non-tender. Bowel sounds normal. No masses,  No organomegaly.        Musculoskeleta : Normal range of motion in most of the joints   Extremities:  Extremities normal, atraumatic, no cyanosis or edema.    Pulses:  2+ and symmetric all extremities.    Skin:  Skin color, texture, turgor normal. No rashes or lesions    Neurologic  Psych:  : CNII-XII intact.    confused. No thoughts of harm to self or others              Total time spent  with chart review, patient examination/education, discussion with staff on case,documentation and medication management / adjustment:   >35 minutes    It is always a pleasure to be involved in the clinical care of this patient.    [employer] medical dictation software was used for portions of this report.  Unintended voice transcription errors may have occurred.)    SERGIO CALLANDER, MD  December 15, 2024  [facility]  Pager:  [phone]  "

## 2024-12-15 NOTE — Plan of Care (Signed)
"    Problem: Safety - Adult  Goal: Free from fall injury  12/15/2024 2229 by Darroll Willma LABOR, RN  Outcome: Progressing  12/15/2024 1221 by Roselind Savant, RN  Outcome: Progressing     Problem: Pain  Goal: Verbalizes/displays adequate comfort level or baseline comfort level  Outcome: Progressing     Problem: Skin/Tissue Integrity  Goal: Skin integrity remains intact  Outcome: Progressing     "

## 2024-12-15 NOTE — Care Coordination (Signed)
"  [facility]   Care Manager Update    Patient Name: Emily Henson     MRN: [id number]   Age: 89 y.o.   Sex: female                   Primary Care Physician Cephas Cletis SAUNDERS, MD  Unit/Room: 5227/5227  Admit Date: 12/10/2024   Length of stay: 5  Admitting Diagnosis: Acute encephalopathy [G93.40]  Acute metabolic encephalopathy [G93.41]  Nausea and vomiting, unspecified vomiting type [R11.2]   Primary P[place] [employer] [employer] [place]  Current Attending Physician: Onita Pill, MD      Discharge Planning Update    Clinician Recommended Plan for Discharge:  SNF- Skilled Nursing Facility    Patient/Authorized Representative Agreement    Care Management has reviewed the recommended discharge plan with the patient or authorized representative. Patient/Authorized Representative agrees with the recommended discharge plan and level of care: yes      DME / Infusion Services    DME anticipated or ordered:  no  - If Yes, list DME below  Infusion services anticipated or confirmed:  no   - If Yes, list need below    Payer Source Update    Primary Payer [employer] [place]   Authorization required for post-acute rehab needs:  yes    Age-Friendly Social Vulnerability and SDOH Screening    Assessment of age-friendly social vulnerabilities and social determinants of health was completed collaboratively during this admission; all needs identified and addressed:  yes      Discharge Plan Updates as of December 15, 2024      [facility]. Auth request sent.       Signed:   Mardy MARLA Olmsted, RN , Case Manager  December 15, 2024  9:32 AM  Department Phone: [phone]    "

## 2024-12-16 MED ORDER — CALCIUM CARBONATE 1500 (600 CA) MG PO TABS
1500 | Freq: Every day | ORAL | 2.00 refills | 50.00000 days | Status: AC
Start: 2024-12-16 — End: ?

## 2024-12-16 MED ORDER — POLYETHYL GLYCOL-PROPYL GLYCOL 0.4-0.3 % OP SOLN
0.4-0.3 | Freq: Two times a day (BID) | OPHTHALMIC | 0.00 refills | 15.00000 days | Status: AC
Start: 2024-12-16 — End: ?

## 2024-12-16 MED ORDER — FENOFIBRATE 145 MG PO TABS
145 | Freq: Every day | ORAL | 1.00 refills | 90.00000 days | Status: AC
Start: 2024-12-16 — End: ?

## 2024-12-16 MED ORDER — DONEPEZIL HCL 10 MG PO TABS
10 | Freq: Every day | ORAL | 1.00 refills | 30.00000 days | Status: AC
Start: 2024-12-16 — End: ?

## 2024-12-16 MED ORDER — APIXABAN 5 MG PO TABS
5 | Freq: Two times a day (BID) | ORAL | 1.00 refills | 30.00000 days | Status: AC
Start: 2024-12-16 — End: ?

## 2024-12-16 MED ORDER — MEMANTINE HCL 5 MG PO TABS
5 | ORAL | 1.00 refills | 45.00000 days | Status: AC
Start: 2024-12-16 — End: ?

## 2024-12-16 MED ORDER — VERAPAMIL HCL ER 240 MG PO CP24
240 | Freq: Every day | ORAL | 1.00 refills | 90.00000 days | Status: AC
Start: 2024-12-16 — End: ?

## 2024-12-16 MED ORDER — POLYETHYLENE GLYCOL 3350 17 G PO PACK
17 | Freq: Every day | ORAL | 0.00 refills | 30.00000 days | Status: AC | PRN
Start: 2024-12-16 — End: 2025-01-15

## 2024-12-16 MED ORDER — ONDANSETRON 4 MG PO TBDP
4 | Freq: Three times a day (TID) | ORAL | 0.00 refills | 7.00000 days | Status: AC | PRN
Start: 2024-12-16 — End: ?

## 2024-12-16 MED ORDER — MECLIZINE HCL 25 MG PO TABS
25 | Freq: Four times a day (QID) | ORAL | 0.00 refills | 10.00000 days | Status: AC | PRN
Start: 2024-12-16 — End: ?

## 2024-12-16 MED ORDER — CHOLECALCIFEROL 50 MCG (2000 UT) PO TABS
50 | Freq: Every day | ORAL | 1.00 refills | Status: AC
Start: 2024-12-16 — End: ?

## 2024-12-16 MED ORDER — ATORVASTATIN CALCIUM 80 MG PO TABS
80 | Freq: Every evening | ORAL | 1.00 refills | 90.00000 days | Status: AC
Start: 2024-12-16 — End: ?

## 2024-12-16 MED ORDER — CYANOCOBALAMIN 1000 MCG PO TABS
1000 | Freq: Every day | ORAL | 1.00 refills | 30.00000 days | Status: AC
Start: 2024-12-16 — End: ?

## 2024-12-16 MED ORDER — METOPROLOL TARTRATE 100 MG PO TABS
100 | Freq: Two times a day (BID) | ORAL | 1.00 refills | 90.00000 days | Status: AC
Start: 2024-12-16 — End: ?

## 2024-12-16 MED FILL — ELIQUIS 5 MG PO TABS: 5 mg | ORAL | Qty: 1 | Fill #0

## 2024-12-16 MED FILL — METOPROLOL TARTRATE 50 MG PO TABS: 50 mg | ORAL | Qty: 2 | Fill #0

## 2024-12-16 MED FILL — MEMANTINE HCL 5 MG PO TABS: 5 mg | ORAL | Qty: 2 | Fill #0

## 2024-12-16 MED FILL — DONEPEZIL HCL 5 MG PO TABS: 5 mg | ORAL | Qty: 2 | Fill #0

## 2024-12-16 MED FILL — SODIUM CHLORIDE FLUSH 0.9 % IV SOLN: 0.9 % | INTRAVENOUS | Qty: 10 | Fill #0

## 2024-12-16 MED FILL — MELATONIN 3 MG PO TABS: 3 mg | ORAL | Qty: 1 | Fill #0

## 2024-12-16 NOTE — Discharge Instructions (Signed)
"       General Discharge: Care Instructions  Your Care Instructions     One or more doctors have given you a physical exam, reviewed your symptoms, and asked about your past medical problems. You have had tests as needed.  At this time, the doctors feel it is safe for you to go home.  It is very important that you follow up with your doctor as directed. If you continue to have symptoms, you may need more tests or treatment.  The doctor has checked you carefully, but problems can develop later. If you notice any problems or new symptoms,  get medical treatment right away.  Follow-up care is a key part of your treatment and safety. Be sure to make and go to all appointments, and call your doctor if you are having problems. It's also a good idea to know your test results and keep a list of the medicines you take.  How can you care for yourself at home?  Keep track of any new symptoms or changes in your symptoms.  Rest until you feel better.  Be safe with medicines. Take your medicines exactly as prescribed. Call your doctor if you think you are having a problem with your medicine.  Do not drive after taking a prescription pain medicine.  When should you call for help?   Call 911 anytime you think you may need emergency care. For example, call if:    You passed out (lost consciousness).   Call your doctor now or seek immediate medical care if:    You have new symptoms like fever, difficulty breathing, vomiting, or rash.     You have new or different pain.     You are confused and are having trouble thinking clearly.     Your symptoms are getting worse.   Watch closely for changes in your health, and be sure to contact your doctor if:    You do not get better as expected.   Where can you learn more?  Go to [email or web address] and enter G150 to learn more about General Discharge: Care Instructions.  Current as of: July 18, 2023  Content Version: 14.6   2024-2025 [place].   Care  instructions adapted under license by [facility]. If you have questions about a medical condition or this instruction, always ask your healthcare professional. Romayne Alderman, [facility], disclaims any warranty or liability for your use of this information.    "

## 2024-12-16 NOTE — Care Coordination (Signed)
"  SNF Approved  [facility] [place]  [address]  AUTH#  [id number]  Valid 12/16/24 to 12/22/24  Phone [phone]  Fax# [phone]  Next review Date  12/22/24  Last day to admit 12/22/24 before 11:59pm  "

## 2024-12-16 NOTE — Care Coordination (Signed)
 "  Physician's Certification Medical Necessity Statement For  Non-Emergency Ambulance Service    Patient: Emily Henson Age: 89 y.o. Sex: female    Date of Birth: 23-Mar-1933 Admit Date: 12/10/2024 PCP: Cephas Cletis SAUNDERS, MD   MRN: [id number]  CSN: [id number]  Room: 5227/5227     Chief Complaint:   Chief Complaint   Patient presents with    Fatigue    Chest Pain    Vomiting      Height/Weight: Body mass index is 27.33 kg/m. Body mass index is 27.33 kg/m.   Weight:     Weight - Scale: 74.5 kg (164 lb 3.9 oz)  Weight Source:      Transport From:  [x] Hospital    [] SNF   [] Residence   [] Other:   Transport To:      [] Hospital    [x] SNF   [] Residence   [] Other:   Address:    [facility]     Medicare: [employer] [place]  Transport Date: December 16, 2024 (Valid for round trips this date, or for scheduled repetitive trips for 60 days from date signed below.)  Origin: [facility]  Destination: [facility]    Is the Patient's stay covered under Medicare Part A (PPS/DRG?)  []  Yes    []  No     Closest appropriate facility?  []  Yes    []  No     If no, why was the patient transported to another facility?   If hospital to hospital transfer, describe services needed at 2nd facility not available at 1st facility:   If hospice Pt, is this transport related to Pt's terminal illness? []  Yes    []  No   Describe:     SECTION II - MEDICAL NECESSITY QUESTIONNAIRE  Ambulance transportation is medically necessary only if other means of transport are contraindicated or would be potentially   Harmful to the patient. To meet this requirement, the patient must be either bed confined or suffer from condition such   that transport by means other than an ambulance is contraindicated by the patient's condition.The following questions   must be addressed by the healthcare professional signing below for this form to be valid:    1) Describe the MEDICAL CONDITION space (physical and/or mental) of this patient AT THE TIME OF  AMBULANCE TRANSPORT       that requires the patient to be transported in an ambulance, and why transport in an ambulance, and why transport by       other means is contraindicated by the patient's condition:  Fall risk due to Alzheimer's dementia & recent CVA     2) Is this patient bed confined as defined below?  [x]  Yes    []  No           To be bed confined the patient must satisfy all 3 of the following criteria:           (1) unable to get up from bed without assistance; AND           (2) unable to ambulate; AND           (3) unable to sit in a chair or wheelchair.    3) Can this patient safely be transported by car or wheelchair van (I.e., may safely sit during transport, without an attendant or monitoring?)      []  Yes    [x]  No    4) In addition to completing questions 1-3 above,  please check any of the following conditions that apply*:       *Note: Supporting documentation for any boxes checked must be maintained in the patient's medical records    Patient requires ambulance transportation due to the following condition:  [] Airway control or positioning required en route  [x] Altered Mental Status, Etiology:   [] Amputation of lower extremity, Site:    (BKA / AKA / Other)  [] Asphyxia or Hypoxemia, Etiology:   [] Cancer, Site:   [] Cardiac or Hemodynamic Monitoring required en route  [] Cerebrovascular disease, WITH: Cognitive Defects  [] Cerebrovascular disease, WITH: Hemiparesis / Hemiplegia  [] Cerebrovascular disease, WITH: Monoplegia of a lower limb  [] Chest wall injury  [] Contractures of extremities, Site:   [] Decubitus ulcer, Site:   [] Fracture, Site:   [] Head Injury  [] IV Fluid Management, Medications being administered:   [] PIV:   []  Yes    []  No  [] Oxygen required, patient cannot self-administer, Reason:   [] Oxygen requiring titrated therapy en route  [] Patient Safety: Danger to self or others - flight risk / monitoring  [x] Patient Safety: Risk of falling off wheelchair or stretcher while in  motion  [] Restraints required:  [] Verbal      [] Physical    [] Chemical  [] Special handling en route to reduce pain  [] Special handling en route for patient positioning  [] Special handling en route for Isolation, Type:     Diagnosis:   [] Suctioning required en route  [] Torso or Trunk injury  [] Ventilator Dependent  [x] Other:  Fall risk due to Alzheimer's dementia & recent CVA     SECTION III - SIGNATURE OF PHYSICIAN OR OTHER AUTHORIZED HEALTHCARE PROFESSIONAL  I certify that the above information is accurate based on my evaluation of this patient, and that the medical necessity provisions   of 42 CFR 410.40 (e)(1) are met, requiring that this patient be transported by ambulance.  I understand this information will be used by   the centers for Medicare and Medicaid Services (CMS) to support the determination of medical necessity for ambulance services.  I represent   that I am the beneficiary's attending physician; or an employee of the beneficiary's attending physician, or the hospital or facility where the   beneficiary is being treated and from which the beneficiary is being transported; that I have personal knowledge of the beneficiary's   condition at the time of transport; and that I meet all Medicare regulations and applicable state licensure laws for the credentialed indicated.    [x] If this box is checked, I also certify that the patient is physically or mentally incapable of signing the ambulance services claim form and   that the institution with which I am affiliated has furnished care, services or assistance to the patient.  My signature below was made on behalf   of the patient pursuant to 37 RQM575.63(a)(5).  In accordance with 42 [id number], the specific reason(s) that the patient is physically   or mentally incapable of signing the claim form is as follows.    Electronically signed by:  Eleanor FORBES Morrison  December 16, 2024  1:41 PM    Name: _________________________Title:  ______________________  Facility: [facility]  Phone Number: [phone]  Date: December 16, 2024     Please call to arrange transportation: [phone]  [email or web address]   "

## 2024-12-16 NOTE — Discharge Summary (Signed)
 "Discharge Summary       Patient: Emily Henson Age: 89 y.o. DOB: [birth year] MR#: [id number] SSN: [id number]  PCP on record: Cephas Cletis SAUNDERS, MD  Admit date: 12/10/2024  Discharge date: 12/16/2024    Admission Diagnoses:     Acute encephalopathy [G93.40]  Acute metabolic encephalopathy [G93.41]  Nausea and vomiting, unspecified vomiting type [R11.2]    -    Discharge Diagnoses:                                             Acute encephalopathy  Recently acute CVA  Chronic atrial fibrillation  Alzheimer disease dementia  Lactic acidosis  CAD  Hyperlipidemia  Hypertension  Vitamin B12 deficiency    Medical History   Per H&P,    Emily Henson is a 89 y.o.  female with history significant for Alzheimer disease dementia, chronic atrial fibrillation on Eliquis , recently acute CVA with aphasia, coronary artery disease, hypertension, hyperlipidemia, vitamin B12 deficiency, presented with complaint over the altered mental status.  Patient is a resident of the nursing home . acute encephalopathy.  According to the nursing home staff.  Patient has moderate confused and had an episode of nausea vomiting, received Haldol .  Patient was found become to the lethargic this morning.  EMS was called the patient was brought to the emergency room to be evaluated, in the emergency, he blood pressure 115/71 heart rate 60 respiratory 16 temperature 97.8 O2 sat 100% on the nonrebreather.  Chest x-ray did not show acute change.  Head CT.  No acute finding.  So patient was admitted to the hospital for further management.     Hospital Course by Problem   Patient seen in follow up for multiple medical problems as listed below :     Acute encephalopathy  Head CT did not show acute change  UA and urine culture no growth  Chest x-ray no infiltrate  Hold off the sedation medicatio  IV fluid hydration  monitor mental status  Mental status back to baseline according to the family at the bedside   TSH within normal limit to thiamine level   Resume the previous  diet.  Soft and bite sized , aspiration precaution       Recently acute CVA  On anticoagulation  monitor mental status  PT OT eval  Continue Eliquis      Chronic atrial fibrillation  Episode  of rapid  atrial fibrillation improved with metoprolol   Continue metoprolol  for Rate control   Continue Eliquis      Alzheimer disease dementia  Monitor mental status  Patient to take Aricept  and Namenda  at home  Continue Aricept  at night  Continue current medication     Lactic acidosis trending lactic acid and with IV fluid    Legally blind continue the current care and eyedrop     CAD continue statin and beta-blocker     Hyperlipidemia on statin at home  Hypertension continue metoprolol  monitor pressure closely  Vitamin B12 deficiency vitamin B12 replacement     I  discussed with the daughter at the bedside        Continue home regimen for otherwise chronic, stable medical conditions as noted above. Discussed with patient and or family regarding management, prognosis, treatment and complications of the patient's medical conditions in detail. All questions answered to the satisfaction of those individual(s) who also verbalized understanding of and agreement  with the assessment and plan.      Stable transfer to rehab          Today's examination of the patient revealed:     Objective:   VS: BP (!) 165/91   Pulse 73   Temp 97.7 F (36.5 C) (Temporal)   Resp 17   Ht 1.651 m (5' 5)   Wt 74.5 kg (164 lb 3.9 oz)   SpO2 99%   BMI 27.33 kg/m    Tmax/24hrs: Temp (24hrs), Avg:97.2 F (36.2 C), Min:96.8 F (36 C), Max:97.7 F (36.5 C)     Input/Output:   Intake/Output Summary (Last 24 hours) at 12/16/2024 1233  Last data filed at 12/16/2024 0934  Gross per 24 hour   Intake 360 ml   Output 700 ml   Net -340 ml       General appearance: no distress  Eyes: sclera anicteric  ENT: no oral lesions, thyroid normal  Skin: no spider angiomata, jaundice,   Respiratory: clear to auscultation bilaterally  Cardiovascular: regular heart  rate, no murmurs, no JVD  Abdomen: soft, non-tender, no rebound  Extremities: no muscle wasting, no gross arthritic changes  GU: not examined  Neurologic: alert and oriented, cranial nerves grossly intact,         Labs:      Lab Results   Component Value Date/Time    NA 139 12/11/2024 09:02 AM    K 4.1 12/11/2024 09:02 AM    CL 101 12/11/2024 09:02 AM    CO2 29 12/11/2024 09:02 AM    BUN 7 12/11/2024 09:02 AM    CREATININE 0.79 12/11/2024 09:02 AM    GLUCOSE 91 12/11/2024 09:02 AM    CALCIUM  10.0 12/11/2024 09:02 AM       Lab Results   Component Value Date    WBC 6.4 12/11/2024    HGB 14.8 12/11/2024    HCT 44.4 12/11/2024    MCV 93.1 12/11/2024    PLT 199 12/11/2024    LYMPHOPCT 21.6 12/02/2024    RBC 4.77 12/11/2024    MCH 31.0 12/11/2024    MCHC 33.3 12/11/2024    RDW 45.2 12/11/2024             Additional Data Reviewed:          Xray Result (most recent):  XR CHEST PORTABLE 12/10/2024    Narrative  PROVIDED HISTORY: Chest Pain  Additional information: None.    COMPARISON: None.    FINDINGS:  Portable frontal radiograph of the chest. One view, one exposure(s) total. The  patient is moderately rotated to the left, obscuring the left lung base. A left  subclavian approach pacemaker is in expected position. There is no focal  airspace opacity. No pleural effusion or pneumothorax is evident. The  cardiomediastinal silhouette is normal. There are no displaced rib fractures or  aggressive bone lesions.    Impression  IMPRESSION:  1.  No focal airspace opacity.  2.  Note that the patient is moderately rotated, reducing sensitivity for  detection of pathology in the mediastinum and left lung base.    Electronically signed by: Morton Riches, MD 12/10/2024 11:39 AM EST  Workstation ID: [id number]          MRI Result (most recent):  No results found for this or any previous visit from the past 3650 days.            CT Result (most recent):  CT HEAD WO CONTRAST 12/10/2024    Narrative  HISTORY: ams,  vomiting    TECHNIQUE: Helical acquisition of CT images through the head without contrast  with multiplanar reformats.    COMPARISON: None.    FINDINGS:  Streak artifact from dental amalgam degrades image quality in the brainstem,  posterior fossa, and occipital lobes.    Global parenchymal involutional changes are identified with associated sulcal  and ventricular prominence appropriate for the patient's age. Multifocal  confluent periventricular and subcortical white matter hypodensities are  nonspecific but most commonly related to chronic microvascular ischemic disease.  The basal cisterns are clear. Gray-white matter differentiation is intact. There  is no CT evidence of acute infarction. There is no acute intracranial  hemorrhage, mass, mass effect, or hydrocephalus. Ill-defined hypodensity in the  right cerebellar hemisphere is not well evaluated due to streak artifact but  could represent encephalomalacia from prior infarct.    Osseous structures are intact. Visualized portions of the mastoid air cells and  paranasal sinuses are clear. Note is made of bilateral lens replacements. Orbits  appear otherwise grossly unremarkable.    Impression  IMPRESSION:  1.  No evidence of an acute intracranial process. If there is high clinical  concern for acute infarction, MRI could be considered for further evaluation as  it is a more sensitive examination.  2.  Chronic atrophic and microvascular ischemic changes.  3.  Poorly defined hypodensity in the right cerebellar hemisphere; degraded by  streak artifact from dental amalgam. Likely, this represents encephalomalacia in  the setting of prior infarct. Attention on MRI, if obtained.    Electronically signed by: Morton Riches, MD 12/10/2024 11:03 AM EST  Workstation ID: [id number]             Condition:     Stable  Diet:   Dysphagia soft bite sized cardio diet  Disposition:    [] Home   [] Home with Home Health   [x] SNF/NH   [] Rehab   [] Home with family   [] Alternate  Facility:____________________      Discharge Medications:          Medication List        START taking these medications      ondansetron  4 MG disintegrating tablet  Commonly known as: ZOFRAN -ODT  Take 1 tablet by mouth every 8 hours as needed for Nausea or Vomiting     polyethylene glycol 17 g packet  Commonly known as: GLYCOLAX   Take 1 packet by mouth daily as needed for Constipation            CHANGE how you take these medications      polyethyl glycol-propyl glycol 0.4-0.3 % 0.4-0.3 % ophthalmic solution  Commonly known as: SYSTANE  Place 2 drops into both eyes 2 times daily  What changed: how to take this            CONTINUE taking these medications      apixaban  5 MG Tabs tablet  Commonly known as: Eliquis   Take 1 tablet by mouth 2 times daily     atorvastatin  80 MG tablet  Commonly known as: LIPITOR   Take 1 tablet by mouth nightly     calcium  carbonate 1500 (600 Ca) MG Tabs tablet  Take 1 tablet by mouth daily     Cholecalciferol  50 MCG (2000 UT) Tabs  Take 1 tablet by mouth daily     cyanocobalamin  1000 MCG tablet  Take 1 tablet by mouth daily     donepezil  10 MG tablet  Commonly known as: ARICEPT   Take  1 tablet by mouth daily     fenofibrate  145 MG tablet  Commonly known as: TRICOR   Take 1 tablet by mouth daily     meclizine  25 MG tablet  Commonly known as: ANTIVERT   Take 1 tablet by mouth every 6 hours as needed for Dizziness     memantine  5 MG tablet  Commonly known as: NAMENDA   Take 1 tab by mouth nightly x 1 week then 1 tab twice daily.     metoprolol  100 MG tablet  Commonly known as: LOPRESSOR   Take 1 tablet by mouth 2 times daily     verapamil  240 MG extended release capsule  Commonly known as: VERELAN   Take 1 capsule by mouth daily            STOP taking these medications      vitamin E  1000 units capsule               Where to Get Your Medications        Information about where to get these medications is not yet available    Ask your nurse or doctor about these medications  apixaban  5 MG Tabs  tablet  atorvastatin  80 MG tablet  calcium  carbonate 1500 (600 Ca) MG Tabs tablet  Cholecalciferol  50 MCG (2000 UT) Tabs  cyanocobalamin  1000 MCG tablet  donepezil  10 MG tablet  fenofibrate  145 MG tablet  meclizine  25 MG tablet  memantine  5 MG tablet  metoprolol  100 MG tablet  ondansetron  4 MG disintegrating tablet  polyethyl glycol-propyl glycol 0.4-0.3 % 0.4-0.3 % ophthalmic solution  polyethylene glycol 17 g packet  verapamil  240 MG extended release capsule            Follow-up Appointments and instructions:   Your PCP: Cephas Cletis SAUNDERS, MD, within 3-5 days    Follow-up Information       Follow up With Specialties Details Why Contact Info    [facility]   [address] [place]  [address]  [phone]                      Consults:     Treatment Team: Treatment Team:   Onita Pill, MD  Onita Pill, MD  Sheela Adriana BROCKS., PT  Landy Nat DASEN, RN  Finley Mardy POUR, RN  Estelle Harlene SAILOR, RN    Significant Diagnostic Studies:       Please follow-up on tests/labs that are still pending:    Thank you Dr Cephas, Cletis SAUNDERS, MD for allowing us  to participate in the care of Emily Henson     [facility] medical dictation software was used for portions of this report.  Unintended voice transcription errors may have occurred.)      >42 minutes spent coordinating this discharge (review instructions/follow-up, prescriptions)    Signed:    [facility]  PILL ONITA, MD  12/16/2024  12:33 PM  "

## 2024-12-16 NOTE — Progress Notes (Signed)
 "  OCCUPATIONAL THERAPY EVALUATION   Acknowledge Orders  Time  OT Charge Capture  Rehab Caseload Tracker    [place] AM-PAC 6 Clicks Daily Activity Inpatient Short Form  -    Patient: Emily Henson (89 y.o. female)  Room: 5227/5227    Primary Diagnosis: Acute encephalopathy [G93.40]  Acute metabolic encephalopathy [G93.41]  Nausea and vomiting, unspecified vomiting type [R11.2]       Date of Admission: 12/10/2024   Length of Stay:  6 day(s)  Insurance: Payor: [place] BC[place]ARE / Plan: [place] [employer] [place] / Product Type: PPO /      Date: 12/16/2024  Time In: 1100        Time Out: 1125       Total Minutes: 25   Treatment time: 10 minutes  Treatment included: therapeutic exercise, functional mobility retraining, functional balance retraining, activity tolerance and/or educational instruction during/immediately following OT evaluation    Isolation:  No active isolations       MDRO: No active infections    Precautions: falls   Ordered weight bearing status: none    Current diet order: ADULT DIET; Dysphagia - Soft and Bite Sized; Low Fat/Low Chol/High Fiber/NAS    ASSESSMENT       Based on the objective data described below, the patient presents with     -  decreased independence/ability to perform basic ADLs/IADLs  -  decreased independence in functional mobility   -  generalized muscle weakness affecting function in ADLs  -  unsteady in functional mobility  -  decreased standing tolerance  -  decreased activity tolerance     Patient will benefit from skilled occupational therapy intervention to address the above impairments.    Patients rehabilitation potential is considered to be Good for below stated goals.     Recommendations:  Recommend continued occupational therapy during acute stay.   Recommend out of bed activity to counteract ill effects of bedrest, with assistance from staff as needed.    Discharge Recommendations:   -  Skilled nursing facility (SNF):  Would benefit to improve independence  in ADLs, strength, activity tolerance and balance to ensure a successful and sustainable return to prior level of function.    FUNCTIONAL ASSESSMENT  AM-PAC Inpatient Daily Activity Raw Score: 11 (12/16/24 1136)    -  SNF:  Current research shows that an AM-PAC score of 17 or less is not associated with a discharge to the patient's home setting.  Based on an AM-PAC score and their current ADL deficits; it is recommended that the patient have 3-5 sessions per week of Occupational Therapy at d/c to increase the patient's independence.     This AMPAC score should be considered in conjunction with interdisciplinary team recommendations to determine the most appropriate discharge setting. Patient's social support, diagnosis, medical stability, and prior level of function should also be taken into consideration.    Equipment Recommendations for Discharge:   -  DME needs to be determined at time of d/c from rehab  If patient discharges home:  -  Patient requires a bedside commode to provide patient safety and increased independence, and help prevent falls while using the restroom. Patient needs rails and increased height to be able to safely come to stand.  -  Patient requires a semi-electric hospital bed for home use. Patient is at high risk of fall. Patient requires head of bed to be elevated for comfort and to prevent pain and complications. A hospital bed will make  it easier for change of positions and sleeping. The hospital bed can be lowered to prevent falls when exiting the bed and raised for improved body mechanics for patient's care giver. Furthermore, bed rails will increase safety and assist with positioning in bed.     PRIOR LEVEL OF FUNCTION     Information was obtained by: patient, care giver  Home environment: Patient lives with daughter in a 1 story home. Has/had caregiver  Prior level of function: Patient requires assistance with basic ADLs.  Patient ambulates with white cane and SBA/HHA per care  giver.  Prior level of Instrumental Activities of Daily Living: Patient does not perform any IADLs.  Home equipment: grab bars, white cane    PLAN      Patient will be followed by occupational therapy to address goals while hospitalized as patient's status and schedule permit.   Patient to be seen 1-5 days x 4 weeks for Adaptive equipment, ADI training, activity tolerance, functional balance training, functional mobility training, therapeutic exercise, therapeutic activity, patient/caregiver education and training, and home exercise program     Patient and/or family have participated as able in goal setting and plan of care.    GOALS     OT goals initiated 12/16/2024 and will be met by patient within 10 sessions       Pt will be min assist for toileting up to bathroom with LRAD.  Pt will be min assist for clothes management and hygiene in bathroom.  Pt will be set up for upper body self care sitting eob with normal balance.  Pt will be min assist for lower body self care with AE/DME as needed with good standing balance.   Pt will be set up for grooming and self feeding up to chair.  Pt will return demo HEP for BUE to improve activity tolerance for self care activities.      EDUCATION/COMMUNICATION     Barriers to learning/limitations: Yes;  baseline dementia, legally blind    Education provided un:ejupzwu, adult caretaker on (+) role of OT, (+) OT plan of care, (+) Instructed patient in the benefits of maintaining activity tolerance, functional mobility, and independence with self care tasks during acute stay  to ensure safe return home and to baseline. Encouraged patient to increase frequency and duration OOB, be out of bed for all meals, perform daily ADLs (as approved by RN/MD regarding bathing etc), and performing functional mobility to/from bathroom with staff assistance as needed., (+) functional mobility, (+) ADL training, (+) safety, (+) home exercise program    Educational handouts issued: none this  session    Patient / family response to education: needs reinforcement    SUBJECTIVE     Patient Agreeable    Pain Assessment: none reported, none observed    OBJECTIVE DATA SUMMARY     Orders, labs, and chart reviewed on Emily Henson. Communicated with nursing staff. Patient cleared to participate in Occupational Therapy evaluation.    Patient was admitted to the hospital on 12/10/2024 with   Chief Complaint   Patient presents with    Fatigue    Chest Pain    Vomiting     Present illness history:   Patient Active Problem List    Diagnosis Date Noted    Acute metabolic encephalopathy 12/10/2024    Acute encephalopathy 12/10/2024    Slurred speech 12/03/2024    Suspected cerebrovascular accident (CVA) 12/03/2024    Cerebrovascular accident (CVA) (HCC) 12/02/2024    CAD (coronary artery disease)  OSA (obstructive sleep apnea) 08/13/2023    Snoring 08/13/2023    Mild vascular dementia without behavioral disturbance, psychotic disturbance, mood disturbance, or anxiety (HCC) 07/04/2023    Moderate Alzheimer's dementia without behavioral disturbance, psychotic disturbance, mood disturbance, or anxiety, unspecified timing of dementia onset (HCC) 02/07/2023    Pacemaker 06/05/2017    Hypertension 06/05/2017    Hypertriglyceridemia 06/05/2017    Atrial fibrillation (HCC) 06/05/2017    Blind in both eyes 06/05/2017      Previous medical history:   Past Medical History:   Diagnosis Date    A-fib [facility])     Arthritis     CAD (coronary artery disease)     Dementia (HCC)     Hearing loss     History of abnormal Holter exam 04/07/2024    14 day monitor 04/07/24 100% AF average HR 75 bpm, no symptoms    Hypercholesterolemia     hypertriglycerides    Hypertension     Permanent atrial fibrillation (HCC)     Unclear onset. VVI pacing since 2013. Rates now controlled. 100% AF per 14d monitor 04/07/24.    Sick sinus syndrome (HCC)        PATIENT FOUND     Edge of Bed , overlap with PT, (+) bed/chair exit alarm    COGNITIVE STATUS   (0  Minutes)     Mental status:  Alert  Communication: soft spoken, limited verbalization  Attention Span:  fair (15-63min)  General Cognition:  patient presents with, slow processing, and delayed responses  Follows commands: able to follow simple 1 step commands  Safety/Judgement: decreased insight into deficits  Hearing:   grossly intact  Vision:   legally blind    UPPER EXTREMITY ASSESSMENT (8 Minutes)       RIGHT: active range of motion is [facility]  Strength: 3+/5   LEFT:   active range of motion is [facility]  Strength: 3+/5       Comment(s):   Therapeutic Exercises:  -  SHOULDER: bilateral, AROM exercises: 10 reps / 1 set of flexion/ extension  - AAROM then AROM BLE hip/knee flex in prep for OOB/LB self care. 20 reps x 1 LLE 10 x 2 RLE.     ACTIVITIES OF DAILY LIVING (0 Minutes)     Based on direct observation, simulation and clinical assessment.  (clincial judgement based on: activity tolerance, balance, safety awareness, cognition, functional strength/ROM/coordination of all extremities)    Eating:           - minimum assistance  Grooming:     - moderate assistance  UB bathing:   - moderate assistance  LB bathing:   -  maximum assistance  UB dressing: - moderate assistance  LB dressing: - maximum assistance  Toileting:       - total assistance          THERAPEUTIC ACTIVITIES: FUNCTIONAL MOBILITY AND BALANCE STATUS   (2 Minutes)     Mobility:  -  Supine to sit -  minimum assistance  -  Sit to supine -  contact guard assistance  -  Sit to stand -  minimum assistance, rolling walker  -  Stand to sit -  contact guard assistance     Transfers:  -  able to take one side step.           Static Sitting Balance -           fair:         maintains  balance without assistance with cueing  Dynamic Sitting Balance -      fair (-):     able to perform partial UE ROM ranges with CGA  Static Standing Balance -       fair (-):     contact guard assistance to maintain balance  Dynamic Standing Balance -  poor (+):  requires minimum assistance  to right themselves/maintain standing        ACTIVITY TOLERANCE     - Fatigued with activity. Overlap with PT  - no apparent distress    FINAL LOCATION     Patient positioned in bed all needs within reach agrees to call for assistance (+) bed/chair exit alarm nursing staff notified (+) care giver present    Thank you for this referral.    [facility], OTR/L  December 16, 2024          "

## 2024-12-16 NOTE — Care Coordination (Signed)
"  [facility]                                                                                                 PATIENT INFORMATION   Patient Name: Emily Henson, Emily Henson [facility] Acct: [id number] Patient MRN: [id number]   Address: [address]  [place] [address] Patient CSN: [id number]      Religion: Bertie   Sex: Female Marital Status: Widowed   DOB: 05-19-1933 Age:   89 yrs   Home Phone: [phone] 1.00 Mobile Phone:   [phone]     2.00   Race: Black / Chief Financial Officer:   Retired   Agricultural Engineer: [email or web address] Admitted/Arrived From:      Language: English       ADMISSION INFORMATION   Admit Date: 12/10/2024 Admit Time: 1000   Patient Class: Inpatient Service: Medical   Admit Source: Non-health care facility* Admit Type: Emergency   Admitting Provider: Onita Pill Attending Provider: Onita Pill   Unit: [facility] Room/Bed: 5227/5227    Admission Diagnosis: Acute encephalopathy [G9* Acute encephalopathy, Acute metabolic encephalopathy, Nausea and vomiting, unspecified vomiting type and DX codes: G93.40, G93.41, R11.2   Emergency Complaint: ems..                Discharge Date:   Discharge Time:     GUARANTOR INFORMATION   Name:  Bong,Kesa Address: [address]  Rel:  Self   Phone: [phone]   [place], [place] [address] DOB: 10/12/1933   EMERGENCY CONTACTS   Name: Darby,Michele Home:  Mobile: [phone]  [phone] Rel: Child   COVERAGE INFORMATION   Primary Insurance:   [place] BCBS [place]ICARE Subscriber: Royal Helms   Plan Name: Romual[employer] Charon Lien Medicare Pt Rel to Subscriber: Self [01]   Claim Address: [address] [address]  [place], [place] [address] Sex: Female      Policy #:  [id number]    Group #: [id number] Group Name:   [place]  [employer]   Auth #: Auth number: N/A Ins Phone: [phone]       Secondary Insurance:   Subscriber:     Plan Name:   Pt Rel to Subscriber:     Claim Address: NA Sex:       Policy #: N/A    Group #: N/A Group Name: N/A   Auth #:  N/A Ins Phone:         Accident Date:    Accident Type:     PROVIDER INFORMATION   PCP:         Cephas Cletis SAUNDERS, MD PCP Phone:  [phone]   Referring Prov:   No ref. provider found Referring Phone:  Referring Fax:  N/A      Advanced Directive:  <no information> Research:     Lab Client:   Enrollment Status:         "

## 2024-12-16 NOTE — Progress Notes (Signed)
 "  PHYSICAL THERAPY EVALUATION     Acknowledge Orders  Time  PT Charge Capture  Rehab Caseload Tracker  [facility] AM-PAC 6 Clicks Basic Mobility Inpatient Short Form  -    Patient: Emily Henson (89 y.o. female)  Room: 5227/5227    Primary Diagnosis: Acute encephalopathy [G93.40]  Acute metabolic encephalopathy [G93.41]  Nausea and vomiting, unspecified vomiting type [R11.2]       Date of Admission: 12/10/2024   Length of Stay:  6 day(s)  Insurance: Payor: [place] BC[place] MEDICARE / Plan: [place] BCBS [place] / Product Type: PPO /      Date: 12/16/2024  Time In: 1035       Time Out: 1105   Total Minutes: 30  Treatment Time: 10 minutes    Isolation:  No active isolations       MDRO: No active infections  Current diet order: ADULT DIET; Dysphagia - Soft and Bite Sized; Low Fat/Low Chol/High Fiber/NAS    Precautions: falls   Ordered weight bearing status: no weight bearing restriction     ASSESSMENT:          Based on the objective data described below, the patient presents with  - generalized functional muscle weakness  - functionally very weak  - active participation in therapeutic leg exercises  - Pt was very scared as she sat on EOB  - HR increased from 74(start of tx) to 105 while standing with RW  - Pt require min assist in bed mobility  - demo good sitting balance  - Pt require min assist in sit to stand with RW  - demo poor standing balance  - Pt unable to walk.  - Pt demo generalized weakness affecting functional mobility status  Patient's rehabilitation potential for below stated goals: good    Recommendations:  Recommend continued physical therapy during acute stay. Physical Therapy. Recommend out of bed activity to counteract ill effects of bedrest, with assistance from staff as needed.  Discharge Recommendations: Skilled nursing facility (SNF): patient will benefit from further therapy at rehab facility to increase strength and endurance to return to prior level of function.  Further Equipment  Recommendations for Discharge: Recommend patient use rolling walker; patient has appropriate DME at home.     Functional Outcome Measure:    AM-PAC: AM-PAC Inpatient Mobility Raw Score : 14  -  SNF:  Current research shows that an AM-PAC score of 17 or less is not associated with a discharge to the patient's home setting.  Based on an AM-PAC score and their current ADL deficits; it is recommended that the patient have 3-5 sessions per week of Physical Therapy at d/c to increase the patient's independence.     This AMPAC score should be considered in conjunction with interdisciplinary team recommendations to determine the most appropriate discharge setting. Patient's social support, diagnosis, medical stability, and prior level of function should also be taken into consideration.      PRIOR LEVEL OF FUNCTION / HOME ENVIRONMENT:      Information was obtained by patient and personal caregiver    Home environment:  pt from [facility]  Prior level of function: uses rolling walker for mobility  Home equipment: rolling walker, straight cane    PLAN:      Patient will benefit from skilled Physical Therapy intervention to address the above impairments to return to prior level of function. PT Plan of Care: 3 times/week, 5 times/week.    Patient will achieve PT goals in 1  week.     PHYSICAL THERAPY GOALS:     - Patient will be modified independent with bed mobility in preparation for EOB activities.   - Patient will be supervision with transfers in preparation for OOB activities and ambulation.  - Patient will tolerate sitting up in chair for 1 hour for meals at least twice a day.  - Patient will be contact guard for ambulating 50 feet with the least restrictive device to promote functional independence at home.   - Patient will demonstrate good balance to safely enable upright activities and reduce risk for falls.  - Patient will be modified independent with lower extremity home exercise program to increase strength and  endurance.  - Patient will state/observe Fall precautions.  - Patient will increase FOM score to 17 in order to increase independence and safety with functional mobility.     PLANNED INTERVENTIONS:      Skilled Physical Therapy services will provide functional mobility training, therapeutic exercises, therapeutic activities, patient/caregiver education as indicated.  Skilled Physical Therapy services will modify and progress therapeutic interventions, address functional mobility deficits, address ROM and strength deficits, analyze and cue movement patterns and assess and modify postural abnormalities to reach the stated goals.    COMMUNICATION/EDUCATION:     Education: Ill effects of bedrest, benefits of activity, HEP, role of PT, PT plan of care.    Education provided to: patient  Opportunity for questions and clarification was provided.    Readiness to learn indicated by: needs reinforcement    Barriers to learning/limitations: sensory deficits-vision/hearing/speech    Comprehension: Patient communicated comprehension      SUBJECTIVE:      Patient agreed to PT reported  I'm scared.  I'm shaky.  Patient reports 0/10 pain before treatment and 0/10 pain at conclusion of treatment.  Pain Location: na    OBJECTIVE DATA SUMMARY:      Orders, labs, and chart reviewed on Finn Hilyer.     Present illness history:   Patient Active Problem List    Diagnosis Date Noted    Acute metabolic encephalopathy 12/10/2024    Acute encephalopathy 12/10/2024    Slurred speech 12/03/2024    Suspected cerebrovascular accident (CVA) 12/03/2024    Cerebrovascular accident (CVA) (HCC) 12/02/2024    CAD (coronary artery disease)     OSA (obstructive sleep apnea) 08/13/2023    Snoring 08/13/2023    Mild vascular dementia without behavioral disturbance, psychotic disturbance, mood disturbance, or anxiety (HCC) 07/04/2023    Moderate Alzheimer's dementia without behavioral disturbance, psychotic disturbance, mood disturbance, or anxiety,  unspecified timing of dementia onset (HCC) 02/07/2023    Pacemaker 06/05/2017    Hypertension 06/05/2017    Hypertriglyceridemia 06/05/2017    Atrial fibrillation (HCC) 06/05/2017    Blind in both eyes 06/05/2017      Previous medical history:   Past Medical History:   Diagnosis Date    A-fib (HCC)     Arthritis     CAD (coronary artery disease)     Dementia (HCC)     Hearing loss     History of abnormal Holter exam 04/07/2024    14 day monitor 04/07/24 100% AF average HR 75 bpm, no symptoms    Hypercholesterolemia     hypertriglycerides    Hypertension     Permanent atrial fibrillation (HCC)     Unclear onset. VVI pacing since 2013. Rates now controlled. 100% AF per 14d monitor 04/07/24.    Sick sinus syndrome (HCC)  PATIENT FOUND:     Semi reclined in bed. (+) bed alarm. (+) IV. (+) pure wick. (+) care partner present.    COGNITIVE STATUS:     Mental Status:  Oriented x3   Communication:  patient interacts appropriately   Follows commands:  follows two step commands/direction   General cognition:  intact   Safety/Judgement:  appropriate awareness of environment and need for assistance as well as awareness of precautions       EXTREMITIES ASSESSMENT:      Range Of Motion:    BLE:  range of motion is WFL.      Strength:      RLE: Strength is grossly graded as 3/5   LLE: Strength is grossly graded as 3/5    Sensation:  intact to light touch    Tone  normal      THERAPEUTIC ACTIVITIES; FUNCTIONAL MOBILITY AND BALANCE STATUS:     Patient received/participated in Therapeutic Exercises (10 minutes)    Bed mobility:  Scooting: min assist; with bed flat;   Rolling: contact guard; with HOB slightly raised;   Sup->sit: min assist; with HOB slightly raised;   Sit->supine:  min assist; with HOB slightly raised;     Transfers:  Sit -> stand: min assist; with RW   Stand -> sit: min assist;         Gait Training:  unbale      Balance:   Static sitting balance:          good       Dynamic sitting balance:     good        Static standing balance:      poor      Dynamic standing balance: poor           THERAPEUTIC EXERCISES:      10 reps each, AROM BLE:  ankle pumps, heel slides, hip ab/duction, straight leg raise, glute sets, quad sets    NEUROMUSCULAR RE-EDUCATION:       Balance activities:   Worked to improve reactive postural responses: stable surface, standing.   Required verbal and manual  cues for safety and technique.     ACTIVITY TOLERANCE:     - requires increased time  - activity tolerance limited by deconditioning    FINAL LOCATION:     Head of bed elevated. Positioned with pillows for comfort. (+) care partner present. OT present.    Thank you for this referral.  Madelena Maturin C. Madelene Kaatz, PT     "

## 2024-12-16 NOTE — Progress Notes (Signed)
"  Patient discharged to nursing home via ambulance with family at side. Nurse called report at 18:15 and spoke with [facility] LPN. No distress noted, VSS.   "

## 2024-12-16 NOTE — Care Coordination (Signed)
"  Transport to: [facility]  Reason for transport: Fall risk due to Alzheimer's dementia & recent CVA  Transport set up with: [facility]   Time/Date: 12/06/2024 @ 1800  D/C Summary loaded: Yes  Nurse/CM notified: Yes  Insurance verified on face sheet: Yes  Auth needed: No  "

## 2024-12-16 NOTE — Care Coordination (Signed)
"  [facility]   Case Management Discharge Note    Patient Name: Emily Henson     MRN: [id number]   Age: 89 y.o.   Sex: female                   Primary Care Physician Cephas Cletis SAUNDERS, MD  Unit/Room: 5227/5227  Admit Date: 12/10/2024   Length of stay: 6  Admitting Diagnosis: Acute encephalopathy [G93.40]  Acute metabolic encephalopathy [G93.41]  Nausea and vomiting, unspecified vomiting type [R11.2]   Primary Payer [place] [employer] [place]  Current Attending Physician: Onita Pill, MD    Care Management Discharge Note    Finalized Discharge Plan: SNF- Skilled Nursing Facility [facility]   Discharge Date:  12/16/2024   Care Manager confirmed plan with patient or authorized representative: yes       IMM Given and Documented (if applicable):  yes  Documented age-friendly social vulnerabilities and social determinants of health were assessed and addressed during this admission: no    If yes, list interventions and referrals placed: na      Complete if Discharging to a facility, ALF, or Group Home    Facility Name:  [facility]  Freedom of choice for facility confirmed with Patient/Authorized Representative:  yes  Facility bed availability and discharge date/time confirmed 1800 [facility] with [facility] for transport today  Was LTSS or PASRR Requested: no  If Yes, confirmed upload to Media: no    DME ordered for Discharge:  no      Confirmed delivery with: na    Dialysis:  no     Method of Transportation and Time if scheduled:  [facility] 1800   Care team and patient/family were made aware of DCP and transport time:  yes         Signed:   Mardy MARLA Olmsted, RN , Case Manager  December 16, 2024  1:24 PM  Department Phone: [phone]    "

## 2025-01-08 ENCOUNTER — Inpatient Hospital Stay
Admit: 2025-01-08 | Discharge: 2025-01-09 | Disposition: A | Discharge status: Home / Self Care | Payer: Medicare (Managed Care) | Arrived: AM | Attending: Emergency Medicine

## 2025-01-08 DIAGNOSIS — K649 Unspecified hemorrhoids: Secondary | ICD-10-CM

## 2025-01-08 LAB — CBC WITH AUTO DIFFERENTIAL
Basophils: 0.2 % (ref 0–3)
Eosinophils: 0.3 % (ref 0–5)
Hematocrit: 46.7 % (ref 35.0–47.0)
Hemoglobin: 15.8 g/dL (ref 11.0–16.0)
Immature Granulocytes %: 0.4 % (ref 0.0–3.0)
Lymphocytes: 18 % — ABNORMAL LOW (ref 28–48)
MCH: 31.2 pg (ref 25.4–34.6)
MCHC: 33.8 g/dL (ref 30.0–36.0)
MCV: 92.1 fL (ref 80.0–98.0)
MPV: 10.9 fL — ABNORMAL HIGH (ref 6.0–10.0)
Monocytes: 8.5 % (ref 1–13)
Neutrophils Segmented: 72.6 % — ABNORMAL HIGH (ref 34–64)
Nucleated RBCs: 0 (ref 0–0)
Platelets: 248 10*3/uL (ref 140–450)
RBC: 5.07 M/uL (ref 3.60–5.20)
RDW: 45.6 (ref 36.4–46.3)
WBC: 10.4 10*3/uL (ref 4.0–11.0)

## 2025-01-08 LAB — PROTIME-INR
INR: 1.9 — ABNORMAL HIGH (ref 0.1–1.1)
Protime: 21.7 s — ABNORMAL HIGH (ref 10.2–12.9)

## 2025-01-08 MED ORDER — ACETAMINOPHEN 325 MG PO TABS
325 | ORAL | Status: AC
Start: 2025-01-08 — End: 2025-01-08
  Administered 2025-01-08: 650 mg via ORAL

## 2025-01-08 MED FILL — ACETAMINOPHEN 325 MG PO TABS: 325 mg | ORAL | Qty: 2 | Fill #0

## 2025-01-08 NOTE — ED Notes (Signed)
 Report given to [facility].      Meriel Waddell SAILOR, RN  01/08/25 2340

## 2025-01-08 NOTE — ED Notes (Signed)
 Physician's Certification Medical Necessity Statement For  Non-Emergency Ambulance Service    Patient: Emily Henson Age: 89 y.o. Sex: female    Date of Birth: 05-Dec-1933 Admit Date: 01/08/2025 PCP: Cephas Cletis SAUNDERS, MD   MRN: [id number]  CSN: [id number]  Room: ER37/ER37     Chief Complaint:   Chief Complaint   Patient presents with    Rectal Bleeding      Height/Weight: Body mass index is 29.12 kg/m. Body mass index is 29.12 kg/m.   Weight:     Weight - Scale: 79.4 kg (175 lb)  Weight Source:      Transport From:  [x] Hospital    [] SNF   [] Residence   [] Other:   Transport To:      [] Hospital    [] SNF   [] Residence   [x] Other: [facility]  Address:    [address] [place], [place] [address]    Medicare: [employer] [place] [employer]  Transport Date: January 08, 2025 (Valid for round trips this date, or for scheduled repetitive trips for 60 days from date signed below.)  Origin: [facility]  Destination: Discharge to nursing home  [address]  [place] [address]    Is the Patient's stay covered under Medicare Part A (PPS/DRG?)  []  Yes    []  No     Closest appropriate facility?  []  Yes    []  No     If no, why was the patient transported to another facility?   If hospital to hospital transfer, describe services needed at 2nd facility not available at 1st facility:   If hospice Pt, is this transport related to Pt's terminal illness? []  Yes    []  No   Describe:     SECTION II - MEDICAL NECESSITY QUESTIONNAIRE  Ambulance transportation is medically necessary only if other means of transport are contraindicated or would be potentially   Harmful to the patient. To meet this requirement, the patient must be either bed confined or suffer from condition such   that transport by means other than an ambulance is contraindicated by the patient's condition.The following questions   must be addressed by the healthcare professional signing below for this form to be valid:    1) Describe the MEDICAL CONDITION space (physical and/or mental)  of this patient AT THE TIME OF AMBULANCE TRANSPORT       that requires the patient to be transported in an ambulance, and why transport in an ambulance, and why transport by       other means is contraindicated by the patient's condition: Dementia and fall risk    2) Is this patient bed confined as defined below?  [x]  Yes    []  No           To be bed confined the patient must satisfy all 3 of the following criteria:           (1) unable to get up from bed without assistance; AND           (2) unable to ambulate; AND           (3) unable to sit in a chair or wheelchair.    3) Can this patient safely be transported by car or wheelchair van (I.e., may safely sit during transport, without an attendant or monitoring?)      []  Yes    [x]  No    4) In addition to completing questions 1-3 above, please check any of the following conditions that  apply*:       *Note: Supporting documentation for any boxes checked must be maintained in the patient's medical records    Patient requires ambulance transportation due to the following condition:  [] Airway control or positioning required en route  [x] Altered Mental Status, Etiology: Dementia  [] Amputation of lower extremity, Site:    (BKA / AKA / Other)  [] Asphyxia or Hypoxemia, Etiology:   [] Cancer, Site:   [] Cardiac or Hemodynamic Monitoring required en route  [] Cerebrovascular disease, WITH: Cognitive Defects  [] Cerebrovascular disease, WITH: Hemiparesis / Hemiplegia  [] Cerebrovascular disease, WITH: Monoplegia of a lower limb  [] Chest wall injury  [] Contractures of extremities, Site:   [] Decubitus ulcer, Site:   [] Fracture, Site:   [] Head Injury  [] IV Fluid Management, Medications being administered:   [] PIV:   []  Yes    []  No  [] Oxygen required, patient cannot self-administer, Reason:   [] Oxygen requiring titrated therapy en route  [x] Patient Safety: Danger to self or others - flight risk / monitoring  [] Patient Safety: Risk of falling off wheelchair or stretcher while in  motion  [] Restraints required:  [] Verbal      [] Physical    [] Chemical  [] Special handling en route to reduce pain  [] Special handling en route for patient positioning  [] Special handling en route for Isolation, Type:     Diagnosis:   [] Suctioning required en route  [] Torso or Trunk injury  [] Ventilator Dependent  [x] Other: Fall risk    SECTION III - SIGNATURE OF PHYSICIAN OR OTHER AUTHORIZED HEALTHCARE PROFESSIONAL  I certify that the above information is accurate based on my evaluation of this patient, and that the medical necessity provisions   of 42 CFR 410.40 (e)(1) are met, requiring that this patient be transported by ambulance.  I understand this information will be used by   the centers for Medicare and Medicaid Services (CMS) to support the determination of medical necessity for ambulance services.  I represent   that I am the beneficiary's attending physician; or an employee of the beneficiary's attending physician, or the hospital or facility where the   beneficiary is being treated and from which the beneficiary is being transported; that I have personal knowledge of the beneficiary's   condition at the time of transport; and that I meet all Medicare regulations and applicable state licensure laws for the credentialed indicated.    [] If this box is checked, I also certify that the patient is physically or mentally incapable of signing the ambulance services claim form and   that the institution with which I am affiliated has furnished care, services or assistance to the patient.  My signature below was made on behalf   of the patient pursuant to 13 RQM575.63(a)(5).  In accordance with 42 [id number], the specific reason(s) that the patient is physically   or mentally incapable of signing the claim form is as follows.    Electronically signed by:  WADDELL LOISE MOWER, RN  January 08, 2025  10:31 PM    Name: _________________________Title: ______________________  Facility: [facility]  Phone Number:  [phone]  Date: January 08, 2025     Please call to arrange transportation: [phone]  [email or web address]      Mower Waddell LOISE, RN  01/08/25 2232

## 2025-01-08 NOTE — ED Triage Notes (Signed)
 Patient via EMS from [facility] for eval of bright red rectal bleeding and concern for possible constipation/rectal blockage. Patient on Eliquis . Hx generalized weakness and aphasia from previous CVA

## 2025-01-08 NOTE — ED Notes (Signed)
 Report given Waddell RN     Barbie Quale, RN  01/08/25 1925

## 2025-01-08 NOTE — ED Notes (Signed)
 Enema given to pt with MD. Pt sitting on bedpan at this time. Call bell within reach.      Meriel Waddell SAILOR, RN  01/08/25 2151

## 2025-01-08 NOTE — ED Provider Notes (Signed)
 [facility]  Emergency Department       Patient: Emily Henson Age: 89 y.o. Sex: female    Date of Birth: 1933-03-18 Admit Date: 01/08/2025 PCP: Cephas Cletis SAUNDERS, MD   MRN: [id number]  CSN: [id number]     Room: ER37/ER37 Time Dictated: 6:20 PM      Chief Complaint   Chief Complaint   Patient presents with    Rectal Bleeding       History of Present Illness   89 y.o. female PMH: Chronic atrial fibrillation (on Eliquis ), Alzheimer's dementia, recent CVA (aphasic), coronary disease, hypertension, hyperlipidemia, vitamin B12 deficiency presenting today via EMS from nursing home with concerns for rectal bleeding.  According to EMS, the patient has been having significant pain during bowel movements over the past 3 days, and today the patient had bright red blood in her most recent bowel movement.  The patient has a known history of large hemorrhoids, but given the level of the patient's pain, the nursing home felt that the patient should come to the emergency room.    Review of Systems   Review of Systems   Constitutional:  Negative for fever.   Respiratory:  Negative for cough, shortness of breath and wheezing.    Cardiovascular:  Negative for chest pain.   Gastrointestinal:  Positive for blood in stool and constipation. Negative for abdominal pain, nausea and vomiting.      As outlined in HPI    Past Medical/Surgical History     Past Medical History:   Diagnosis Date    A-fib [facility])     Arthritis     CAD (coronary artery disease)     Dementia (HCC)     Hearing loss     History of abnormal Holter exam 04/07/2024    14 day monitor 04/07/24 100% AF average HR 75 bpm, no symptoms    Hypercholesterolemia     hypertriglycerides    Hypertension     Permanent atrial fibrillation (HCC)     Unclear onset. VVI pacing since 2013. Rates now controlled. 100% AF per 14d monitor 04/07/24.    Sick sinus syndrome [facility])      Past Surgical History:   Procedure Laterality Date    APPENDECTOMY      BREAST REDUCTION SURGERY N/A  1990    CARDIAC PACEMAKER PLACEMENT  09/25/2012    Single chamber Abbott pacemaker implantation for SSS    CHOLECYSTECTOMY N/A 2004    COLECTOMY  2011    Partial colectomy 2011 for diverticulosis    COLONOSCOPY      HYSTERECTOMY, TOTAL ABDOMINAL (CERVIX REMOVED) N/A 1978       Social History     Social History     Socioeconomic History    Marital status: Widowed     Spouse name: Not on file    Number of children: Not on file    Years of education: Not on file    Highest education level: Not on file   Occupational History    Not on file   Tobacco Use    Smoking status: Former    Smokeless tobacco: Never   Substance and Sexual Activity    Alcohol  use: Yes     Comment: occ    Drug use: No    Sexual activity: Not Currently   Other Topics Concern    Not on file   Social History Narrative    Not on file     Social Drivers of Health  Financial Resource Strain: Low Risk  (07/19/2023)    Overall Financial Resource Strain (CARDIA)     Difficulty of Paying Living Expenses: Not hard at all   Food Insecurity: Patient Declined (12/13/2024)    Hunger Vital Sign     Worried About Running Out of Food in the Last Year: Patient declined     Ran Out of Food in the Last Year: Patient declined   Transportation Needs: Patient Declined (12/13/2024)    PRAPARE - Therapist, Art (Medical): Patient declined     Lack of Transportation (Non-Medical): Patient declined   Physical Activity: Inactive (04/17/2024)    Exercise Vital Sign     Days of Exercise per Week: 0 days     Minutes of Exercise per Session: 0 min   Stress: Not on file   Social Connections: Feeling Socially Integrated (02/20/2023)    OASIS D0700: Social Isolation     Frequency of experiencing loneliness or isolation: Rarely   Intimate Partner Violence: Not on file   Housing Stability: Patient Declined (12/13/2024)    Housing Stability Vital Sign     Unable to Pay for Housing in the Last Year: Patient declined     Number of Times Moved in the Last Year: 1      Homeless in the Last Year: Patient declined       Family History     Family History   Problem Relation Age of Onset    Diabetes Mother     Hypertension Mother     Alzheimer's Disease Sister     Sleep Apnea Sister        Current Medications     Current Facility-Administered Medications   Medication Dose Route Frequency Provider Last Rate Last Admin    acetaminophen  (TYLENOL ) tablet 650 mg  650 mg Oral NOW Raynaldo Rigg, MD         Current Outpatient Medications   Medication Sig Dispense Refill    apixaban  (ELIQUIS ) 5 MG TABS tablet Take 1 tablet by mouth 2 times daily      meclizine  (ANTIVERT ) 25 MG tablet Take 1 tablet by mouth every 6 hours as needed for Dizziness      atorvastatin  (LIPITOR ) 80 MG tablet Take 1 tablet by mouth nightly      fenofibrate  (TRICOR ) 145 MG tablet Take 1 tablet by mouth daily      metoprolol  (LOPRESSOR ) 100 MG tablet Take 1 tablet by mouth 2 times daily      cyanocobalamin  1000 MCG tablet Take 1 tablet by mouth daily      verapamil  (VERELAN ) 240 MG extended release capsule Take 1 capsule by mouth daily      calcium  carbonate 1500 (600 Ca) MG TABS tablet Take 1 tablet by mouth daily      donepezil  (ARICEPT ) 10 MG tablet Take 1 tablet by mouth daily      memantine  (NAMENDA ) 5 MG tablet Take 1 tab by mouth nightly x 1 week then 1 tab twice daily.      polyethyl glycol-propyl glycol 0.4-0.3 % (SYSTANE) 0.4-0.3 % ophthalmic solution Place 2 drops into both eyes 2 times daily      Cholecalciferol  50 MCG (2000 UT) TABS Take 1 tablet by mouth daily      polyethylene glycol (GLYCOLAX ) 17 g packet Take 1 packet by mouth daily as needed for Constipation      ondansetron  (ZOFRAN -ODT) 4 MG disintegrating tablet Take 1 tablet by mouth every 8 hours  as needed for Nausea or Vomiting         Allergies     Allergies   Allergen Reactions    Caffeine Palpitations       Physical Exam     ED Triage Vitals [01/08/25 1749]   BP Girls Systolic BP Percentile Girls Diastolic BP Percentile Boys Systolic BP  Percentile Boys Diastolic BP Percentile Temp Temp Source Pulse   (!) 160/98 -- -- -- -- 97.7 F (36.5 C) Oral 86      Respirations SpO2 Height Weight - Scale       18 95 % 1.651 m (5' 5) 74.4 kg (164 lb)          Physical Exam  Constitutional:       General: She is not in acute distress.     Appearance: Normal appearance. She is obese. She is not ill-appearing or toxic-appearing.   HENT:      Head: Normocephalic and atraumatic.      Right Ear: Tympanic membrane normal.      Left Ear: Tympanic membrane normal.      Mouth/Throat:      Mouth: Mucous membranes are moist.      Pharynx: No oropharyngeal exudate or posterior oropharyngeal erythema.   Eyes:      Extraocular Movements: Extraocular movements intact.      Pupils: Pupils are equal, round, and reactive to light.   Cardiovascular:      Rate and Rhythm: Normal rate and regular rhythm.      Heart sounds: No murmur heard.     No gallop.   Pulmonary:      Effort: Pulmonary effort is normal. No respiratory distress.      Breath sounds: Normal breath sounds. No wheezing or rhonchi.   Chest:      Chest wall: No tenderness.   Abdominal:      Palpations: Abdomen is soft.      Tenderness: There is no abdominal tenderness. There is no right CVA tenderness, left CVA tenderness, guarding or rebound.   Genitourinary:     Rectum: Guaiac result negative.      Comments: 2 cm external hemorrhoid present in anus without signs of thrombosis.  Large stool burden present at the most distal portion of rectal vault.  No gross blood on rectal exam.  Fecal occult blood test negative.  Musculoskeletal:         General: No tenderness. Normal range of motion.      Right lower leg: No edema.      Left lower leg: No edema.   Skin:     General: Skin is warm.      Capillary Refill: Capillary refill takes less than 2 seconds.      Findings: No erythema, lesion or rash.   Neurological:      General: No focal deficit present.      Mental Status: She is alert and oriented to person, place, and  time.      Cranial Nerves: No cranial nerve deficit.      Sensory: No sensory deficit.      Motor: No weakness.      Coordination: Coordination normal.   Psychiatric:         Mood and Affect: Mood normal.           Impression and Management Plan     Differential diagnoses hemorrhoid, diverticulitis, GI malignancy, bowel obstruction, trauma, gastric/duodenal ulcer, upper GI bleed, Mallory-Weiss tear, Boerhaave syndrome, mesenteric ischemia, ischemic  colitis    Diagnostic Studies   Lab:   No results found for any visits on 01/08/25.     Medications   acetaminophen  (TYLENOL ) tablet 650 mg (has no administration in time range)         Medical Decision Making/ED Course        Procedures      EXTERNAL RECORDS & PREVIOUS RESULTS REVIEWED:  I reviewed the patient's previous records here at [facility] and available outside facilities and note that patient has a history of Alzheimer's dementia, chronic A-fib on Eliquis , recent CVA with aphasia, coronary artery disease, hypertension, hyperlipidemia.  Recent hospital admission with discharge on 12/16/2024 in the setting of patient having acute encephalopathy.    INDEPENDENT HISTORIAN:  History and/or plan development assisted by: EMS    SOCIAL DETERMINANTS  impacting the Medical Decision Making: stress,   baseline dementia which makes follow-up problematic or uncertain.    Comorbidities impacting Evaluation and Management: Alzheimer's dementia, atrial fibrillation (on Eliquis )    NARRATIVE:   89 year old female PMH: Atrial fibrillation (on Eliquis ), Alzheimer dementia, recent CVA (aphasia), hypertension, hyperlipidemia presenting today with reports of bright red blood per rectum.  Based concern would be a massive GI bleed complicated by patient's anticoagulation.  Patient is currently hemodynamically normal with vitals only significant for hypertension to 160 systolic.  I would expect patient to be tachycardic and potentially hypotensive should the patient have had a massive GI bleed  at this point.  No history of aortic grafting making aortoenteric fistula less likely.  During chart review, I do not see any history of malignancy lowering concern for GI malignancy as source of potential GI bleed.  Atrial fibrillation also raise concern for potential mesenteric ischemia secondary thrombus, however patient is anticoagulated, patient denies having any abdominal pain and has a completely normal and benign abdominal exam at this point.  On exam, patient has 1 large nonthrombosed hemorrhoid on her anus that is very tender to palpation.  This is likely the source of patient's pain that was reported by the nursing home.  Fecal occult blood test was negative, and there was no frank blood during digital rectal examination making active GI bleed less likely at this time.  Diverticulitis also considered, however patient does not have any left lower quadrant tenderness.  On exam there was a large stool burden present in the most distal portion of the rectal vault.  Will perform fecal impaction here in the emergency room.    Given concern for GI bleed, CBC, type and screen will be ordered to see if patient has any drop in hemoglobin.     Performed manual fecal disimpaction and removed approximately 500cc of firm stool that did not have any blood in it.  Labs were also reassuring with hemoglobin 15.8 (up from previous draw on 12/11/2024).  BMP also within normal limits and creatinine 0.77.  Lactate 1.9 lowering concern for issue with current metabolic state.  Hepatic function panel also within normal limits.  Patient's bright red blood is likely from her hemorrhoid as that is where the blood was emanating on her exam, there are no other signs of acute hemorrhage.  No need for anticoagulation reversal at this time.    Will order an enema to ensure patient can have a bowel movement.    Patient tolerated enema well and had bowel movement while here in the emergency room. Given the above, there is little concern  for acute GI bleed. Patient's bowels are clear and patient  will be sent home with senna-docusate to help with her bowel regimen. Otherwise, the remainder patient's history and physical exam are benign and very reassuring.  Given the above, no further emergent medical workup is necessary at this time, the patient will be discharged home.  Patient's daughter was described the results of all exams performed here in the emergency room, and she had no further questions at the end of the encounter.  She was given very strict return precautions and was advised to follow-up with her primary care provider to discuss today's visit.    Final Diagnosis       ICD-10-CM    1. Hemorrhoids, unspecified hemorrhoid type  K64.9       2. Constipation, unspecified constipation type  K59.00       3. Bright red blood per rectum  K62.5             Disposition   Discharge    Glean Croft, MD   Emergency Medicine PGY-4  [facility]  January 08, 2025  6:20 PM     Patient seen with Attending, Dr. Vinita    Portions of this electronic record were dictated using Dragon voice recognition software.  Unintended errors in translation may occur.                     Croft Glean, MD  Resident  01/08/25 [phone]

## 2025-01-09 LAB — COMPREHENSIVE METABOLIC PANEL
ALT: 11 U/L (ref 10–49)
AST: 15 U/L (ref 0.0–33.9)
Albumin: 3.3 g/dL — ABNORMAL LOW (ref 3.4–5.0)
Alkaline Phosphatase: 55 U/L (ref 46–116)
Anion Gap: 12 mmol/L (ref 5–15)
BUN: 7 mg/dL — ABNORMAL LOW (ref 9–23)
CO2: 22 meq/L (ref 20–31)
Calcium: 11.3 mg/dL — ABNORMAL HIGH (ref 8.7–10.4)
Chloride: 106 meq/L (ref 98–107)
Creatinine: 0.77 mg/dL (ref 0.55–1.02)
GFR African American: >60
GFR Non-African American: >60
Glucose: 118 mg/dL — ABNORMAL HIGH (ref 74–106)
Potassium: 3.8 meq/L (ref 3.5–5.1)
Sodium: 140 meq/L (ref 136–145)
Total Bilirubin: 0.6 mg/dL (ref 0.30–1.20)
Total Protein: 6.6 g/dL (ref 5.7–8.2)

## 2025-01-09 LAB — LACTIC ACID: Lactate: 1.9 mmol/L (ref 0.5–2.2)

## 2025-01-09 LAB — ANTIBODY SCREEN: Antibody Screen: NEGATIVE

## 2025-01-09 LAB — ABO/RH: ABO/Rh: O POS

## 2025-01-09 MED ORDER — MINERAL OIL RE ENEM
RECTAL | Status: AC
Start: 2025-01-09 — End: 2025-01-08
  Administered 2025-01-09: 03:00:00 1 via RECTAL

## 2025-01-09 MED ORDER — SENNA-DOCUSATE SODIUM 8.6-50 MG PO TABS
8.6-50 | ORAL_TABLET | Freq: Every day | ORAL | 0 refills | 30.00000 days | Status: AC
Start: 2025-01-09 — End: ?

## 2025-01-09 MED FILL — FLEET OIL RE ENEM: RECTAL | Qty: 1 | Fill #0
